# Patient Record
Sex: Female | Born: 1950 | Race: White | Hispanic: No | Marital: Married | Smoking: Former smoker
Health system: Southern US, Community
[De-identification: ages and names within clinical notes are randomized; demographics above are authoritative.]

## PROBLEM LIST (undated history)

## (undated) DIAGNOSIS — C801 Malignant (primary) neoplasm, unspecified: Secondary | ICD-10-CM

## (undated) DIAGNOSIS — E785 Hyperlipidemia, unspecified: Secondary | ICD-10-CM

## (undated) DIAGNOSIS — Z8041 Family history of malignant neoplasm of ovary: Secondary | ICD-10-CM

## (undated) DIAGNOSIS — J449 Chronic obstructive pulmonary disease, unspecified: Secondary | ICD-10-CM

## (undated) DIAGNOSIS — Z8701 Personal history of pneumonia (recurrent): Secondary | ICD-10-CM

## (undated) DIAGNOSIS — T8859XA Other complications of anesthesia, initial encounter: Secondary | ICD-10-CM

## (undated) DIAGNOSIS — F419 Anxiety disorder, unspecified: Secondary | ICD-10-CM

## (undated) DIAGNOSIS — M1990 Unspecified osteoarthritis, unspecified site: Secondary | ICD-10-CM

## (undated) DIAGNOSIS — T4145XA Adverse effect of unspecified anesthetic, initial encounter: Secondary | ICD-10-CM

## (undated) DIAGNOSIS — R918 Other nonspecific abnormal finding of lung field: Secondary | ICD-10-CM

## (undated) DIAGNOSIS — M5126 Other intervertebral disc displacement, lumbar region: Secondary | ICD-10-CM

## (undated) DIAGNOSIS — K2100 Gastro-esophageal reflux disease with esophagitis, without bleeding: Secondary | ICD-10-CM

## (undated) DIAGNOSIS — Z9889 Other specified postprocedural states: Secondary | ICD-10-CM

## (undated) DIAGNOSIS — R0602 Shortness of breath: Secondary | ICD-10-CM

## (undated) DIAGNOSIS — F32A Depression, unspecified: Secondary | ICD-10-CM

## (undated) DIAGNOSIS — F329 Major depressive disorder, single episode, unspecified: Secondary | ICD-10-CM

## (undated) DIAGNOSIS — M419 Scoliosis, unspecified: Secondary | ICD-10-CM

## (undated) DIAGNOSIS — J439 Emphysema, unspecified: Secondary | ICD-10-CM

## (undated) DIAGNOSIS — Z8711 Personal history of peptic ulcer disease: Secondary | ICD-10-CM

## (undated) DIAGNOSIS — Z973 Presence of spectacles and contact lenses: Secondary | ICD-10-CM

## (undated) DIAGNOSIS — N200 Calculus of kidney: Secondary | ICD-10-CM

## (undated) DIAGNOSIS — K5900 Constipation, unspecified: Secondary | ICD-10-CM

## (undated) DIAGNOSIS — Z87442 Personal history of urinary calculi: Secondary | ICD-10-CM

## (undated) DIAGNOSIS — M5136 Other intervertebral disc degeneration, lumbar region: Secondary | ICD-10-CM

## (undated) DIAGNOSIS — C3490 Malignant neoplasm of unspecified part of unspecified bronchus or lung: Secondary | ICD-10-CM

## (undated) DIAGNOSIS — J310 Chronic rhinitis: Secondary | ICD-10-CM

## (undated) DIAGNOSIS — Z8709 Personal history of other diseases of the respiratory system: Secondary | ICD-10-CM

## (undated) DIAGNOSIS — K219 Gastro-esophageal reflux disease without esophagitis: Secondary | ICD-10-CM

## (undated) DIAGNOSIS — I2510 Atherosclerotic heart disease of native coronary artery without angina pectoris: Secondary | ICD-10-CM

## (undated) DIAGNOSIS — R112 Nausea with vomiting, unspecified: Secondary | ICD-10-CM

## (undated) DIAGNOSIS — N814 Uterovaginal prolapse, unspecified: Secondary | ICD-10-CM

## (undated) DIAGNOSIS — Z8719 Personal history of other diseases of the digestive system: Secondary | ICD-10-CM

## (undated) HISTORY — DX: Gastro-esophageal reflux disease with esophagitis: K21.0

## (undated) HISTORY — DX: Emphysema, unspecified: J43.9

## (undated) HISTORY — DX: Other intervertebral disc degeneration, lumbar region: M51.36

## (undated) HISTORY — PX: COLONOSCOPY: SHX174

## (undated) HISTORY — PX: ESOPHAGOGASTRODUODENOSCOPY: SHX1529

## (undated) HISTORY — DX: Calculus of kidney: N20.0

## (undated) HISTORY — DX: Unspecified osteoarthritis, unspecified site: M19.90

## (undated) HISTORY — DX: Chronic obstructive pulmonary disease, unspecified: J44.9

## (undated) HISTORY — PX: KNEE SURGERY: SHX244

## (undated) HISTORY — PX: SINUS SURGERY WITH INSTATRAK: SHX5215

## (undated) HISTORY — DX: Chronic rhinitis: J31.0

## (undated) HISTORY — PX: TUBAL LIGATION: SHX77

## (undated) HISTORY — DX: Malignant neoplasm of unspecified part of unspecified bronchus or lung: C34.90

## (undated) HISTORY — DX: Hyperlipidemia, unspecified: E78.5

## (undated) HISTORY — PX: EYE SURGERY: SHX253

## (undated) HISTORY — DX: Scoliosis, unspecified: M41.9

## (undated) HISTORY — PX: DILATION AND CURETTAGE OF UTERUS: SHX78

## (undated) HISTORY — DX: Gastro-esophageal reflux disease with esophagitis, without bleeding: K21.00

## (undated) HISTORY — PX: VOCAL CORD LATERALIZATION, ENDOSCOPIC APPROACH W/ MLB: SHX2664

## (undated) HISTORY — PX: BREAST EXCISIONAL BIOPSY: SUR124

## (undated) HISTORY — DX: Other intervertebral disc displacement, lumbar region: M51.26

---

## 1990-02-03 HISTORY — PX: BREAST SURGERY: SHX581

## 1998-01-25 ENCOUNTER — Ambulatory Visit (HOSPITAL_COMMUNITY): Admission: RE | Admit: 1998-01-25 | Discharge: 1998-01-25 | Payer: Self-pay | Admitting: Obstetrics and Gynecology

## 1998-01-25 ENCOUNTER — Encounter: Payer: Self-pay | Admitting: Obstetrics and Gynecology

## 1998-07-06 ENCOUNTER — Other Ambulatory Visit: Admission: RE | Admit: 1998-07-06 | Discharge: 1998-07-06 | Payer: Self-pay | Admitting: Obstetrics and Gynecology

## 1999-02-28 ENCOUNTER — Encounter: Payer: Self-pay | Admitting: Internal Medicine

## 1999-02-28 ENCOUNTER — Encounter: Admission: RE | Admit: 1999-02-28 | Discharge: 1999-02-28 | Payer: Self-pay | Admitting: Internal Medicine

## 1999-07-02 ENCOUNTER — Encounter: Payer: Self-pay | Admitting: Obstetrics and Gynecology

## 1999-07-02 ENCOUNTER — Encounter: Admission: RE | Admit: 1999-07-02 | Discharge: 1999-07-02 | Payer: Self-pay | Admitting: Obstetrics and Gynecology

## 1999-07-09 ENCOUNTER — Encounter: Payer: Self-pay | Admitting: Urology

## 1999-07-09 ENCOUNTER — Encounter: Admission: RE | Admit: 1999-07-09 | Discharge: 1999-07-09 | Payer: Self-pay

## 1999-07-26 ENCOUNTER — Other Ambulatory Visit: Admission: RE | Admit: 1999-07-26 | Discharge: 1999-07-26 | Payer: Self-pay | Admitting: Obstetrics and Gynecology

## 2000-01-03 ENCOUNTER — Ambulatory Visit (HOSPITAL_COMMUNITY): Admission: RE | Admit: 2000-01-03 | Discharge: 2000-01-03 | Payer: Self-pay | Admitting: Obstetrics and Gynecology

## 2000-01-03 ENCOUNTER — Encounter (INDEPENDENT_AMBULATORY_CARE_PROVIDER_SITE_OTHER): Payer: Self-pay | Admitting: Specialist

## 2000-07-02 ENCOUNTER — Encounter: Payer: Self-pay | Admitting: Obstetrics and Gynecology

## 2000-07-02 ENCOUNTER — Encounter: Admission: RE | Admit: 2000-07-02 | Discharge: 2000-07-02 | Payer: Self-pay | Admitting: Obstetrics and Gynecology

## 2000-07-27 ENCOUNTER — Other Ambulatory Visit: Admission: RE | Admit: 2000-07-27 | Discharge: 2000-07-27 | Payer: Self-pay | Admitting: Otolaryngology

## 2000-07-27 ENCOUNTER — Encounter (INDEPENDENT_AMBULATORY_CARE_PROVIDER_SITE_OTHER): Payer: Self-pay

## 2000-07-30 ENCOUNTER — Other Ambulatory Visit: Admission: RE | Admit: 2000-07-30 | Discharge: 2000-07-30 | Payer: Self-pay | Admitting: Obstetrics and Gynecology

## 2000-10-09 ENCOUNTER — Encounter: Payer: Self-pay | Admitting: Otolaryngology

## 2000-10-09 ENCOUNTER — Encounter: Admission: RE | Admit: 2000-10-09 | Discharge: 2000-10-09 | Payer: Self-pay | Admitting: Otolaryngology

## 2000-10-21 ENCOUNTER — Other Ambulatory Visit: Admission: RE | Admit: 2000-10-21 | Discharge: 2000-10-21 | Payer: Self-pay | Admitting: Otolaryngology

## 2000-10-21 ENCOUNTER — Encounter (INDEPENDENT_AMBULATORY_CARE_PROVIDER_SITE_OTHER): Payer: Self-pay | Admitting: Specialist

## 2001-06-17 ENCOUNTER — Encounter: Payer: Self-pay | Admitting: Family Medicine

## 2001-06-17 ENCOUNTER — Encounter: Admission: RE | Admit: 2001-06-17 | Discharge: 2001-06-17 | Payer: Self-pay | Admitting: Family Medicine

## 2001-07-02 ENCOUNTER — Encounter: Admission: RE | Admit: 2001-07-02 | Discharge: 2001-07-02 | Payer: Self-pay | Admitting: Obstetrics and Gynecology

## 2001-07-02 ENCOUNTER — Encounter: Payer: Self-pay | Admitting: Obstetrics and Gynecology

## 2001-07-05 ENCOUNTER — Other Ambulatory Visit: Admission: RE | Admit: 2001-07-05 | Discharge: 2001-07-05 | Payer: Self-pay | Admitting: Obstetrics and Gynecology

## 2001-09-13 ENCOUNTER — Encounter: Admission: RE | Admit: 2001-09-13 | Discharge: 2001-09-13 | Payer: Self-pay | Admitting: Orthopedic Surgery

## 2001-09-13 ENCOUNTER — Encounter: Payer: Self-pay | Admitting: Orthopedic Surgery

## 2002-02-25 ENCOUNTER — Encounter: Admission: RE | Admit: 2002-02-25 | Discharge: 2002-02-25 | Payer: Self-pay | Admitting: Otolaryngology

## 2002-02-25 ENCOUNTER — Encounter: Payer: Self-pay | Admitting: Otolaryngology

## 2002-09-23 ENCOUNTER — Encounter: Admission: RE | Admit: 2002-09-23 | Discharge: 2002-09-23 | Payer: Self-pay | Admitting: Obstetrics and Gynecology

## 2002-09-23 ENCOUNTER — Encounter: Payer: Self-pay | Admitting: Obstetrics and Gynecology

## 2002-11-04 ENCOUNTER — Other Ambulatory Visit: Admission: RE | Admit: 2002-11-04 | Discharge: 2002-11-04 | Payer: Self-pay | Admitting: Obstetrics and Gynecology

## 2003-01-09 ENCOUNTER — Encounter: Admission: RE | Admit: 2003-01-09 | Discharge: 2003-01-09 | Payer: Self-pay | Admitting: Family Medicine

## 2003-01-20 ENCOUNTER — Ambulatory Visit (HOSPITAL_COMMUNITY): Admission: RE | Admit: 2003-01-20 | Discharge: 2003-01-20 | Payer: Self-pay | Admitting: Family Medicine

## 2003-01-26 ENCOUNTER — Ambulatory Visit (HOSPITAL_COMMUNITY): Admission: RE | Admit: 2003-01-26 | Discharge: 2003-01-26 | Payer: Self-pay | Admitting: Family Medicine

## 2003-03-24 ENCOUNTER — Emergency Department (HOSPITAL_COMMUNITY): Admission: EM | Admit: 2003-03-24 | Discharge: 2003-03-24 | Payer: Self-pay | Admitting: Emergency Medicine

## 2003-04-17 ENCOUNTER — Encounter: Admission: RE | Admit: 2003-04-17 | Discharge: 2003-04-17 | Payer: Self-pay | Admitting: Family Medicine

## 2003-05-05 DIAGNOSIS — C801 Malignant (primary) neoplasm, unspecified: Secondary | ICD-10-CM

## 2003-05-05 HISTORY — PX: LUNG LOBECTOMY: SHX167

## 2003-05-05 HISTORY — DX: Malignant (primary) neoplasm, unspecified: C80.1

## 2003-05-11 ENCOUNTER — Ambulatory Visit (HOSPITAL_COMMUNITY): Admission: RE | Admit: 2003-05-11 | Discharge: 2003-05-11 | Payer: Self-pay | Admitting: Thoracic Surgery

## 2003-05-22 ENCOUNTER — Inpatient Hospital Stay (HOSPITAL_COMMUNITY): Admission: RE | Admit: 2003-05-22 | Discharge: 2003-05-26 | Payer: Self-pay | Admitting: Thoracic Surgery

## 2003-05-22 ENCOUNTER — Encounter (INDEPENDENT_AMBULATORY_CARE_PROVIDER_SITE_OTHER): Payer: Self-pay | Admitting: Specialist

## 2003-05-31 ENCOUNTER — Encounter: Admission: RE | Admit: 2003-05-31 | Discharge: 2003-05-31 | Payer: Self-pay | Admitting: Thoracic Surgery

## 2003-06-21 ENCOUNTER — Encounter: Admission: RE | Admit: 2003-06-21 | Discharge: 2003-06-21 | Payer: Self-pay | Admitting: Thoracic Surgery

## 2003-07-19 ENCOUNTER — Encounter: Admission: RE | Admit: 2003-07-19 | Discharge: 2003-07-19 | Payer: Self-pay | Admitting: Thoracic Surgery

## 2003-08-06 ENCOUNTER — Inpatient Hospital Stay (HOSPITAL_COMMUNITY): Admission: EM | Admit: 2003-08-06 | Discharge: 2003-08-08 | Payer: Self-pay | Admitting: Emergency Medicine

## 2003-10-19 ENCOUNTER — Ambulatory Visit (HOSPITAL_COMMUNITY): Admission: RE | Admit: 2003-10-19 | Discharge: 2003-10-19 | Payer: Self-pay | Admitting: Oncology

## 2003-10-30 ENCOUNTER — Encounter: Admission: RE | Admit: 2003-10-30 | Discharge: 2003-10-30 | Payer: Self-pay | Admitting: Obstetrics and Gynecology

## 2003-11-01 ENCOUNTER — Encounter: Admission: RE | Admit: 2003-11-01 | Discharge: 2003-11-01 | Payer: Self-pay | Admitting: Obstetrics and Gynecology

## 2003-11-08 ENCOUNTER — Other Ambulatory Visit: Admission: RE | Admit: 2003-11-08 | Discharge: 2003-11-08 | Payer: Self-pay | Admitting: Obstetrics and Gynecology

## 2004-02-08 ENCOUNTER — Ambulatory Visit: Payer: Self-pay | Admitting: Oncology

## 2004-02-08 ENCOUNTER — Ambulatory Visit (HOSPITAL_COMMUNITY): Admission: RE | Admit: 2004-02-08 | Discharge: 2004-02-08 | Payer: Self-pay | Admitting: Oncology

## 2004-05-08 ENCOUNTER — Ambulatory Visit (HOSPITAL_COMMUNITY): Admission: RE | Admit: 2004-05-08 | Discharge: 2004-05-08 | Payer: Self-pay | Admitting: Oncology

## 2004-05-13 ENCOUNTER — Ambulatory Visit: Payer: Self-pay | Admitting: Oncology

## 2004-07-27 ENCOUNTER — Encounter: Admission: RE | Admit: 2004-07-27 | Discharge: 2004-07-27 | Payer: Self-pay | Admitting: Orthopedic Surgery

## 2004-08-12 ENCOUNTER — Ambulatory Visit: Payer: Self-pay | Admitting: Oncology

## 2004-08-20 ENCOUNTER — Encounter: Admission: RE | Admit: 2004-08-20 | Discharge: 2004-08-20 | Payer: Self-pay | Admitting: Orthopedic Surgery

## 2004-09-03 ENCOUNTER — Encounter: Admission: RE | Admit: 2004-09-03 | Discharge: 2004-09-03 | Payer: Self-pay | Admitting: Orthopedic Surgery

## 2004-09-24 ENCOUNTER — Encounter: Admission: RE | Admit: 2004-09-24 | Discharge: 2004-09-24 | Payer: Self-pay | Admitting: Orthopedic Surgery

## 2004-11-19 ENCOUNTER — Encounter: Admission: RE | Admit: 2004-11-19 | Discharge: 2004-11-19 | Payer: Self-pay | Admitting: Obstetrics and Gynecology

## 2004-11-25 ENCOUNTER — Other Ambulatory Visit: Admission: RE | Admit: 2004-11-25 | Discharge: 2004-11-25 | Payer: Self-pay | Admitting: Obstetrics and Gynecology

## 2004-12-20 ENCOUNTER — Ambulatory Visit: Payer: Self-pay | Admitting: Oncology

## 2005-03-17 ENCOUNTER — Encounter: Admission: RE | Admit: 2005-03-17 | Discharge: 2005-03-17 | Payer: Self-pay | Admitting: Obstetrics and Gynecology

## 2005-05-08 ENCOUNTER — Ambulatory Visit: Payer: Self-pay | Admitting: Oncology

## 2005-05-08 LAB — COMPREHENSIVE METABOLIC PANEL
ALT: 16 U/L (ref 0–40)
Alkaline Phosphatase: 75 U/L (ref 39–117)
CO2: 23 mEq/L (ref 19–32)
Creatinine, Ser: 0.6 mg/dL (ref 0.4–1.2)
Sodium: 140 mEq/L (ref 135–145)
Total Bilirubin: 0.8 mg/dL (ref 0.3–1.2)
Total Protein: 7.1 g/dL (ref 6.0–8.3)

## 2005-05-08 LAB — CBC WITH DIFFERENTIAL/PLATELET
BASO%: 0.5 % (ref 0.0–2.0)
HCT: 44.1 % (ref 34.8–46.6)
LYMPH%: 33.8 % (ref 14.0–48.0)
MCH: 30.6 pg (ref 26.0–34.0)
MCHC: 34.4 g/dL (ref 32.0–36.0)
MCV: 88.9 fL (ref 81.0–101.0)
MONO#: 0.4 10*3/uL (ref 0.1–0.9)
MONO%: 6.3 % (ref 0.0–13.0)
NEUT%: 58 % (ref 39.6–76.8)
Platelets: 282 10*3/uL (ref 145–400)
RBC: 4.96 10*6/uL (ref 3.70–5.32)

## 2005-05-08 LAB — LACTATE DEHYDROGENASE: LDH: 151 U/L (ref 94–250)

## 2005-05-12 ENCOUNTER — Ambulatory Visit (HOSPITAL_COMMUNITY): Admission: RE | Admit: 2005-05-12 | Discharge: 2005-05-12 | Payer: Self-pay | Admitting: Oncology

## 2005-09-11 ENCOUNTER — Ambulatory Visit (HOSPITAL_COMMUNITY): Admission: RE | Admit: 2005-09-11 | Discharge: 2005-09-11 | Payer: Self-pay | Admitting: Oncology

## 2005-09-17 ENCOUNTER — Ambulatory Visit: Payer: Self-pay | Admitting: Oncology

## 2005-09-19 LAB — COMPREHENSIVE METABOLIC PANEL
ALT: 14 U/L (ref 0–40)
AST: 15 U/L (ref 0–37)
Albumin: 4.5 g/dL (ref 3.5–5.2)
Alkaline Phosphatase: 73 U/L (ref 39–117)
Glucose, Bld: 96 mg/dL (ref 70–99)
Potassium: 3.9 mEq/L (ref 3.5–5.3)
Sodium: 143 mEq/L (ref 135–145)
Total Bilirubin: 0.6 mg/dL (ref 0.3–1.2)
Total Protein: 6.7 g/dL (ref 6.0–8.3)

## 2005-09-19 LAB — CBC WITH DIFFERENTIAL/PLATELET
Basophils Absolute: 0 10*3/uL (ref 0.0–0.1)
EOS%: 1.7 % (ref 0.0–7.0)
HGB: 14.7 g/dL (ref 11.6–15.9)
LYMPH%: 27.6 % (ref 14.0–48.0)
MCH: 30.9 pg (ref 26.0–34.0)
MCV: 89.4 fL (ref 81.0–101.0)
MONO%: 7 % (ref 0.0–13.0)
Platelets: 261 10*3/uL (ref 145–400)
RDW: 12.8 % (ref 11.3–14.5)

## 2005-11-25 ENCOUNTER — Encounter: Admission: RE | Admit: 2005-11-25 | Discharge: 2005-11-25 | Payer: Self-pay | Admitting: Obstetrics and Gynecology

## 2005-12-02 ENCOUNTER — Other Ambulatory Visit: Admission: RE | Admit: 2005-12-02 | Discharge: 2005-12-02 | Payer: Self-pay | Admitting: Obstetrics & Gynecology

## 2006-01-07 ENCOUNTER — Ambulatory Visit (HOSPITAL_COMMUNITY): Admission: RE | Admit: 2006-01-07 | Discharge: 2006-01-07 | Payer: Self-pay | Admitting: Oncology

## 2006-01-09 ENCOUNTER — Ambulatory Visit: Payer: Self-pay | Admitting: Oncology

## 2006-01-19 LAB — COMPREHENSIVE METABOLIC PANEL
ALT: 12 U/L (ref 0–35)
AST: 16 U/L (ref 0–37)
Albumin: 4.7 g/dL (ref 3.5–5.2)
Alkaline Phosphatase: 79 U/L (ref 39–117)
Calcium: 9.4 mg/dL (ref 8.4–10.5)
Chloride: 104 mEq/L (ref 96–112)
Creatinine, Ser: 0.68 mg/dL (ref 0.40–1.20)
Potassium: 4 mEq/L (ref 3.5–5.3)

## 2006-01-19 LAB — CBC WITH DIFFERENTIAL/PLATELET
BASO%: 0.8 % (ref 0.0–2.0)
EOS%: 1.7 % (ref 0.0–7.0)
MCH: 31.5 pg (ref 26.0–34.0)
MCHC: 34.8 g/dL (ref 32.0–36.0)
RDW: 12.6 % (ref 11.3–14.5)
lymph#: 1.9 10*3/uL (ref 0.9–3.3)

## 2006-05-07 ENCOUNTER — Ambulatory Visit: Payer: Self-pay | Admitting: Oncology

## 2006-05-12 LAB — LACTATE DEHYDROGENASE: LDH: 138 U/L (ref 94–250)

## 2006-05-12 LAB — CBC WITH DIFFERENTIAL/PLATELET
Basophils Absolute: 0.1 10*3/uL (ref 0.0–0.1)
Eosinophils Absolute: 0.1 10*3/uL (ref 0.0–0.5)
HCT: 43.8 % (ref 34.8–46.6)
LYMPH%: 31.3 % (ref 14.0–48.0)
MCV: 89.9 fL (ref 81.0–101.0)
MONO#: 0.4 10*3/uL (ref 0.1–0.9)
MONO%: 7 % (ref 0.0–13.0)
NEUT#: 3.4 10*3/uL (ref 1.5–6.5)
NEUT%: 58.9 % (ref 39.6–76.8)
Platelets: 259 10*3/uL (ref 145–400)
WBC: 5.8 10*3/uL (ref 3.9–10.0)

## 2006-05-12 LAB — COMPREHENSIVE METABOLIC PANEL
BUN: 12 mg/dL (ref 6–23)
CO2: 24 mEq/L (ref 19–32)
Creatinine, Ser: 0.57 mg/dL (ref 0.40–1.20)
Glucose, Bld: 123 mg/dL — ABNORMAL HIGH (ref 70–99)
Sodium: 141 mEq/L (ref 135–145)
Total Bilirubin: 0.5 mg/dL (ref 0.3–1.2)
Total Protein: 6.7 g/dL (ref 6.0–8.3)

## 2006-05-18 ENCOUNTER — Ambulatory Visit (HOSPITAL_COMMUNITY): Admission: RE | Admit: 2006-05-18 | Discharge: 2006-05-18 | Payer: Self-pay | Admitting: Oncology

## 2006-11-06 ENCOUNTER — Ambulatory Visit: Payer: Self-pay | Admitting: Oncology

## 2006-11-10 ENCOUNTER — Ambulatory Visit (HOSPITAL_COMMUNITY): Admission: RE | Admit: 2006-11-10 | Discharge: 2006-11-10 | Payer: Self-pay | Admitting: Oncology

## 2006-11-10 LAB — COMPREHENSIVE METABOLIC PANEL
ALT: 17 U/L (ref 0–35)
CO2: 27 mEq/L (ref 19–32)
Calcium: 9.2 mg/dL (ref 8.4–10.5)
Chloride: 103 mEq/L (ref 96–112)
Creatinine, Ser: 0.63 mg/dL (ref 0.40–1.20)
Sodium: 141 mEq/L (ref 135–145)
Total Protein: 6.8 g/dL (ref 6.0–8.3)

## 2006-11-10 LAB — CBC WITH DIFFERENTIAL/PLATELET
BASO%: 0.7 % (ref 0.0–2.0)
Eosinophils Absolute: 0.1 10*3/uL (ref 0.0–0.5)
HCT: 43.4 % (ref 34.8–46.6)
MCHC: 35.3 g/dL (ref 32.0–36.0)
MONO#: 0.4 10*3/uL (ref 0.1–0.9)
NEUT#: 3.6 10*3/uL (ref 1.5–6.5)
NEUT%: 62.3 % (ref 39.6–76.8)
WBC: 5.7 10*3/uL (ref 3.9–10.0)
lymph#: 1.5 10*3/uL (ref 0.9–3.3)

## 2006-11-10 LAB — LACTATE DEHYDROGENASE: LDH: 158 U/L (ref 94–250)

## 2006-11-12 ENCOUNTER — Encounter: Admission: RE | Admit: 2006-11-12 | Discharge: 2006-11-12 | Payer: Self-pay | Admitting: Family Medicine

## 2006-12-15 ENCOUNTER — Encounter: Admission: RE | Admit: 2006-12-15 | Discharge: 2006-12-15 | Payer: Self-pay | Admitting: Obstetrics & Gynecology

## 2006-12-15 ENCOUNTER — Other Ambulatory Visit: Admission: RE | Admit: 2006-12-15 | Discharge: 2006-12-15 | Payer: Self-pay | Admitting: Obstetrics and Gynecology

## 2007-05-04 ENCOUNTER — Ambulatory Visit: Payer: Self-pay | Admitting: Oncology

## 2007-05-06 LAB — COMPREHENSIVE METABOLIC PANEL
ALT: 15 U/L (ref 0–35)
Albumin: 4.7 g/dL (ref 3.5–5.2)
CO2: 26 mEq/L (ref 19–32)
Calcium: 9.5 mg/dL (ref 8.4–10.5)
Chloride: 103 mEq/L (ref 96–112)
Potassium: 4.1 mEq/L (ref 3.5–5.3)
Sodium: 141 mEq/L (ref 135–145)
Total Protein: 7 g/dL (ref 6.0–8.3)

## 2007-05-06 LAB — CBC WITH DIFFERENTIAL/PLATELET
Basophils Absolute: 0 10*3/uL (ref 0.0–0.1)
Eosinophils Absolute: 0.1 10*3/uL (ref 0.0–0.5)
HCT: 42 % (ref 34.8–46.6)
HGB: 14.9 g/dL (ref 11.6–15.9)
MCH: 31.5 pg (ref 26.0–34.0)
MCV: 88.9 fL (ref 81.0–101.0)
MONO%: 8.2 % (ref 0.0–13.0)
NEUT#: 2.7 10*3/uL (ref 1.5–6.5)
NEUT%: 55.8 % (ref 39.6–76.8)
RDW: 13.1 % (ref 11.3–14.5)
lymph#: 1.6 10*3/uL (ref 0.9–3.3)

## 2007-05-06 LAB — LACTATE DEHYDROGENASE: LDH: 154 U/L (ref 94–250)

## 2007-05-07 ENCOUNTER — Ambulatory Visit (HOSPITAL_COMMUNITY): Admission: RE | Admit: 2007-05-07 | Discharge: 2007-05-07 | Payer: Self-pay | Admitting: Oncology

## 2007-11-11 ENCOUNTER — Ambulatory Visit: Payer: Self-pay | Admitting: Oncology

## 2007-11-15 LAB — CBC WITH DIFFERENTIAL/PLATELET
BASO%: 0.5 % (ref 0.0–2.0)
Basophils Absolute: 0 10*3/uL (ref 0.0–0.1)
EOS%: 1.7 % (ref 0.0–7.0)
HCT: 44.5 % (ref 34.8–46.6)
HGB: 15.7 g/dL (ref 11.6–15.9)
LYMPH%: 31.2 % (ref 14.0–48.0)
MCH: 32 pg (ref 26.0–34.0)
MCHC: 35.3 g/dL (ref 32.0–36.0)
MCV: 90.7 fL (ref 81.0–101.0)
MONO%: 8 % (ref 0.0–13.0)
NEUT%: 58.6 % (ref 39.6–76.8)
Platelets: 249 10*3/uL (ref 145–400)

## 2007-11-15 LAB — COMPREHENSIVE METABOLIC PANEL
ALT: 13 U/L (ref 0–35)
AST: 16 U/L (ref 0–37)
BUN: 11 mg/dL (ref 6–23)
Calcium: 9.7 mg/dL (ref 8.4–10.5)
Chloride: 103 mEq/L (ref 96–112)
Creatinine, Ser: 0.65 mg/dL (ref 0.40–1.20)
Total Bilirubin: 0.8 mg/dL (ref 0.3–1.2)

## 2007-12-16 ENCOUNTER — Encounter: Admission: RE | Admit: 2007-12-16 | Discharge: 2007-12-16 | Payer: Self-pay | Admitting: Obstetrics & Gynecology

## 2007-12-23 ENCOUNTER — Other Ambulatory Visit: Admission: RE | Admit: 2007-12-23 | Discharge: 2007-12-23 | Payer: Self-pay | Admitting: Obstetrics and Gynecology

## 2008-01-05 ENCOUNTER — Encounter: Admission: RE | Admit: 2008-01-05 | Discharge: 2008-01-05 | Payer: Self-pay | Admitting: Obstetrics and Gynecology

## 2008-11-10 ENCOUNTER — Ambulatory Visit: Payer: Self-pay | Admitting: Oncology

## 2008-11-14 LAB — CBC WITH DIFFERENTIAL/PLATELET
BASO%: 0.5 % (ref 0.0–2.0)
Basophils Absolute: 0 10*3/uL (ref 0.0–0.1)
EOS%: 2.6 % (ref 0.0–7.0)
HGB: 14.7 g/dL (ref 11.6–15.9)
MCH: 31.1 pg (ref 25.1–34.0)
MCHC: 34.6 g/dL (ref 31.5–36.0)
MONO#: 0.4 10*3/uL (ref 0.1–0.9)
NEUT%: 55.8 % (ref 38.4–76.8)
RDW: 12.7 % (ref 11.2–14.5)
WBC: 5 10*3/uL (ref 3.9–10.3)
lymph#: 1.7 10*3/uL (ref 0.9–3.3)

## 2008-11-14 LAB — COMPREHENSIVE METABOLIC PANEL
Albumin: 4.4 g/dL (ref 3.5–5.2)
BUN: 12 mg/dL (ref 6–23)
Glucose, Bld: 81 mg/dL (ref 70–99)
Sodium: 142 mEq/L (ref 135–145)
Total Protein: 6.4 g/dL (ref 6.0–8.3)

## 2008-11-14 LAB — LACTATE DEHYDROGENASE: LDH: 142 U/L (ref 94–250)

## 2008-12-05 ENCOUNTER — Ambulatory Visit (HOSPITAL_COMMUNITY): Admission: RE | Admit: 2008-12-05 | Discharge: 2008-12-05 | Payer: Self-pay | Admitting: Oncology

## 2009-01-03 HISTORY — PX: CYSTOSCOPY: SUR368

## 2009-01-09 ENCOUNTER — Encounter: Admission: RE | Admit: 2009-01-09 | Discharge: 2009-01-09 | Payer: Self-pay | Admitting: Obstetrics & Gynecology

## 2009-01-16 ENCOUNTER — Encounter: Admission: RE | Admit: 2009-01-16 | Discharge: 2009-01-16 | Payer: Self-pay | Admitting: Obstetrics & Gynecology

## 2009-02-03 HISTORY — PX: RETINAL DETACHMENT SURGERY: SHX105

## 2009-03-15 ENCOUNTER — Encounter: Admission: RE | Admit: 2009-03-15 | Discharge: 2009-03-15 | Payer: Self-pay | Admitting: Family Medicine

## 2009-11-13 ENCOUNTER — Ambulatory Visit: Payer: Self-pay | Admitting: Oncology

## 2009-11-15 ENCOUNTER — Ambulatory Visit (HOSPITAL_COMMUNITY): Admission: RE | Admit: 2009-11-15 | Discharge: 2009-11-15 | Payer: Self-pay | Admitting: Oncology

## 2009-11-15 LAB — COMPREHENSIVE METABOLIC PANEL
ALT: 16 U/L (ref 0–35)
Albumin: 4.3 g/dL (ref 3.5–5.2)
Calcium: 9 mg/dL (ref 8.4–10.5)
Chloride: 107 mEq/L (ref 96–112)
Potassium: 4.6 mEq/L (ref 3.5–5.3)

## 2009-11-15 LAB — CBC WITH DIFFERENTIAL/PLATELET
BASO%: 0.8 % (ref 0.0–2.0)
EOS%: 3.1 % (ref 0.0–7.0)
HCT: 41.9 % (ref 34.8–46.6)
LYMPH%: 44.2 % (ref 14.0–49.7)
MCH: 31 pg (ref 25.1–34.0)
MCV: 89.5 fL (ref 79.5–101.0)
MONO%: 8.6 % (ref 0.0–14.0)
NEUT#: 1.5 10*3/uL (ref 1.5–6.5)
NEUT%: 43.3 % (ref 38.4–76.8)
Platelets: 233 10*3/uL (ref 145–400)
RDW: 13 % (ref 11.2–14.5)
WBC: 3.5 10*3/uL — ABNORMAL LOW (ref 3.9–10.3)

## 2009-11-15 LAB — LACTATE DEHYDROGENASE: LDH: 154 U/L (ref 94–250)

## 2010-01-25 ENCOUNTER — Encounter
Admission: RE | Admit: 2010-01-25 | Discharge: 2010-01-25 | Payer: Self-pay | Source: Home / Self Care | Attending: Obstetrics & Gynecology | Admitting: Obstetrics & Gynecology

## 2010-02-24 ENCOUNTER — Encounter: Payer: Self-pay | Admitting: Obstetrics & Gynecology

## 2010-02-24 ENCOUNTER — Encounter: Payer: Self-pay | Admitting: Obstetrics and Gynecology

## 2010-02-26 ENCOUNTER — Other Ambulatory Visit: Payer: Self-pay | Admitting: Obstetrics & Gynecology

## 2010-02-26 DIAGNOSIS — Z780 Asymptomatic menopausal state: Secondary | ICD-10-CM

## 2010-03-11 ENCOUNTER — Ambulatory Visit
Admission: RE | Admit: 2010-03-11 | Discharge: 2010-03-11 | Disposition: A | Discharge status: Home / Self Care | Payer: Self-pay | Source: Ambulatory Visit | Attending: Obstetrics & Gynecology | Admitting: Obstetrics & Gynecology

## 2010-03-11 DIAGNOSIS — Z780 Asymptomatic menopausal state: Secondary | ICD-10-CM

## 2010-06-21 NOTE — Consult Note (Signed)
NAMEMarland Kitchen  Tammy Eaton, Tammy Eaton                          ACCOUNT NO.:  [id number]   MEDICAL RECORD NO.:  [id number]                   PATIENT TYPE:  INP   LOCATION:  2038                                 FACILITY:  [facility]   PHYSICIAN:  Samul Dada, M.D.            DATE OF BIRTH:  September 25, 1950   DATE OF CONSULTATION:  05/25/2003  DATE OF DISCHARGE:  05/26/2003                                   CONSULTATION   REFERRING PHYSICIAN:  Ines Bloomer, M.D.   HISTORY OF PRESENT ILLNESS:  Tammy Eaton is a pleasant 60 year old female  asked to see in consultation for evaluation of lung cancer.  In 2000, a left  upper lobe lesion was found incidentally per chest x-ray, followed until now  by chest x-ray and CTs.  A CT scan on April 17, 2003, revealed an increasing  size of the lesion, from 11 x 16 mm in December 2004, to 16 mm x 8 cm now,  with appearance strongly suspicious for cancer.  A follow-up PET scan on  May 11, 2003, revealed this mass to be consistent with malignancy.  No  adenopathy was noted.  A left upper lobectomy on May 22, 2003, was  performed by Dr. Edwyna Shell, pathology report [id number], Dr. Delila Spence.  This was  positive for adenocarcinoma of the left upper lung, G2, negative margins,  negative pleural involvement, no direct extension of the tumor, no vascular  invasion, T1, N0, MX equals stage IA, for a maximum tumor size of 1.5 cm.  Will continue to follow.   PAST MEDICAL HISTORY:  1. Lung cancer as above.  2. COPD, history of tobacco abuse.  3. Hyperlipidemia.  4. GERD.  5. Chronic depression.  6. Chronic sinusitis.  7. 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   SURGERIES, PROCEDURES:  1. Status post left upper lobectomy on May 18, 2003, Dr. Edwyna Shell.  2. Status post hysteroscopic resection and D&C January 03, 2000, Dr. Margaretmary Dys,     for fibroids.  3. Status post left lumpectomy, benign, 1994.  4. Status post left sinus  polypectomy in 2001.   ALLERGIES:  SEPTRA.   CURRENT MEDICATIONS:  1. Protonix 40 mg daily.  2. Xanax t.i.d.  3. Percocet.  4. Ultram.  5. Compazine p.r.n.   REVIEW OF SYSTEMS:  The patient's review of systems is essentially negative  with no fever, chills, fatigue, or weight loss.  No decrease in appetite.  No cardiac or respiratory complaints prior to this surgery.  She is somewhat  dyspneic on exertion post surgery, but she is recuperating.  She denies any  abdominal pain, nausea or vomiting, no diarrhea or constipation, no  dysphagia, no dysuria or gross hematuria.  No calf tenderness,  dysthesthesias or peripheral edema.   FAMILY HISTORY:  Mother alive with a history of CVA in 60.  Father died of  alcohol complications.  She has one sister and one brother, in good health.   SOCIAL HISTORY:  The patient is married.  She has two grown children in good  health.  She is a housewife.  She smoked until three years ago, one pack a  day of cigarettes for 40 years.  She is trying to make an effort to quit.   HEALTH MAINTENANCE:  Her last mammogram was in August 2004.  Pap exam August  2004.  Her last flexible sigmoidoscopy was two years ago.  She never had a  transfusion.  Her last flu shot was in October 2004.  Her last bone  densitometry was in August 2003, normal.   PHYSICAL EXAMINATION:  GENERAL:  This is a well-developed, well-nourished 60-  year-old white female in no acute distress, alert and oriented x3.  VITAL SIGNS:  Blood pressure 105/50, pulse 70, respirations 20, temperature  98.1.  Weight 138 pounds, height 5 feet 7 inches.  Pulse oximetry 91% in  room air.  HEENT:  Normocephalic, atraumatic, PERRLA.  Oral mucosa without any visible  lesions or thrush.  Moist.  NECK:  Supple, no JVD, no cervical or supraclavicular masses.  CHEST:  Symmetrical on inspiration, with lungs essentially clear to  auscultation.  The left incision is clean and dry.  BREASTS:  Without  masses.  CARDIAC:  Regular rate and rhythm without murmurs, rubs, or gallops.  ABDOMEN:  Soft, nontender, bowel sounds x4, no palpable spleen or liver.  GENITOURINARY, RECTAL:  Deferred.  EXTREMITIES:  No clubbing, cyanosis, edema, or visible lesions, no purpura  or petechiae.  NEUROLOGIC:  Nonfocal.   LABORATORY DATA:  Hemoglobin 12.1, hematocrit 35.4, white count 8.6,  platelets 257, neutrophils 8, MCV 89.6.  PT 12.1, PTT 32, INR 0.9.  Sodium  136, potassium 3.8, BUN 4, creatinine 0.5, glucose 126, total bilirubin 0.7,  alkaline phosphatase 55, AST 24, ALT 17.  Total protein 5.6, albumin 2.8,  calcium 8.1.   ASSESSMENT AND PLAN:  Stage IA non-small cell lung carcinoma, T1, N0, MX.  Dr. Arline Asp has seen and evaluated the patient, and he will be happy to  follow Tammy Eaton after discharge.  No additional therapy is necessary or  indicated at this time.   Thank you very much for allowing [place] to participate in the care of Ms.  Eaton.     Marlowe Kays, P.A.                        Samul Dada, M.D.    SW/MEDQ  D:  05/29/2003  T:  05/29/2003  Job:  [id number]   cc:   Dr. Alonza Bogus, Psychiatry   Duncan Dull, M.D.  [address]  [place]  [place] [address]  Fax: [phone]   Ines Bloomer, M.D.  [address]  [place]  [place] [address]

## 2010-06-21 NOTE — Discharge Summary (Signed)
NAME:  Tammy Eaton, Tammy Eaton                          ACCOUNT NO.:  [id number]   MEDICAL RECORD NO.:  [id number]                   PATIENT TYPE:  INP   LOCATION:  2038                                 FACILITY:  [facility]   PHYSICIAN:  Ines Bloomer, M.D.              DATE OF BIRTH:  1950/02/23   DATE OF ADMISSION:  05/22/2003  DATE OF DISCHARGE:  05/26/2003                                 DISCHARGE SUMMARY   HISTORY OF PRESENT ILLNESS:  This is a 60 year old white female referred  from Dr. Kevan Ny for evaluation of left upper lobe lung mass. His mass was  originally found on chest x-ray in 2000. He has been followed with serial  chest x-rays and CT scans of the chest with the last CT being done in  September of 2004 revealing scarring of the left upper lobe. She was also  noted to have central lobar emphysema. A CT scan on April 17, 2003 revealed  increased size of the lesion from 11 x 7 to 60 x 80 mm. The new and enlarged  appearance was felt to be strongly suspicious for carcinoma. She was  referred and evaluated by Dr. Edwyna Shell, and studies including PET scan showed  findings consistent with probable malignancy. Her SUV was 4.6. There was no  adenopathy noted. She was felt to be a candidate for video-assisted  thoracoscopy and possible left upper lobectomy and was admitted this  hospitalization for the procedure.   PAST MEDICAL HISTORY:  1. COPD.  2. Dyslipidemia.  3. GERD.  4. Chronic depression.   ALLERGIES:  She has an allergy to [facility].   MEDICATIONS PRIOR TO ADMISSION:  1. Alprazolam 0.5 mg t.i.d. p.r.n.  2. Paroxetine 40 mg daily.  3. Aciphex 20 mg daily.  4. Mucinex 600 mg daily.  5. Zyrtec 10 mg daily.   She also takes multiple vitamins and supplements including coenzyme Q10 two  tablets daily, Estra-C 500 mg two tablets daily, vitamin C 500 mg two  tablets daily, multivitamin one daily, pomegranate 250 mg daily, calcium two  daily.   SOCIAL HISTORY, FAMILY HISTORY, REVIEW  OF SYSTEMS, AND PHYSICAL EXAM:  Please see the history and physical done at the time of admission.   HOSPITAL COURSE:  The patient was admitted electively on May 22, 2003 and  taken to the operating room where she underwent the following procedure:  Left thoracotomy with left upper lobe lobectomy and lymph node sampling.  Frozen section revealed adenocarcinoma. She tolerated the procedure well and  was taken to the post anesthesia care unit in stable condition.   Postoperative hospital course, the patient has done quite well from a post-  surgical view point. She has remained hemodynamically stable with no  significant cardiac dysrhythmias. She has only a mild postoperative anemia.  Her incisions are healing well without signs of infection. She has tolerated  a routine enhancement in activities, and oxygen  has been weaned, and she has  undergone significant pulmonary toilet with good improvement in aeration as  well as in chest x-ray appearance of her lungs. All routine lines, monitors,  and drainage devices were discontinued in the standard fashion. She was seen  in consultation by Dr. Arline Asp. Her pathology reveals a T1, N0, MX stage I-  A, non-small cell lung carcinoma. She is felt to require additional adjuvant  chemotherapy or radiation therapy. She will be followed as an outpatient by  the oncologist. Overall, she was felt to be stable quite stable for  discharge on May 26, 2003.   FINAL DIAGNOSIS:  Stage I-A non-small cell lung carcinoma. Note, the patient  did undergo a brain scan on May 26, 2003 which was negative for evidence  of malignancy.   OTHER DIAGNOSES:  As previously listed including:  1. Chronic obstructive pulmonary disease.  2. Tobacco abuse.  3. Hyperlipidemia.  4. Gastroesophageal reflux disease.  5. Chronic depression.  6. Chronic sinusitis.  7. History of benign laryngeal nodule.   INSTRUCTIONS:  The patient received written instructions in regards  to  medications, activity, diet, wound care, and followup. Followup will include  Dr. Edwyna Shell in one week with a chest x-ray from the [facility].   MEDICATIONS AT DISCHARGE:  As preoperatively. Additionally, for pain, Tylox  one or two every four to six hours as needed.      Tammy Eaton, P.A.-C.                    Ines Bloomer, M.D.    Sherryll Burger  D:  05/26/2003  T:  05/28/2003  Job:  [id number]   cc:   Samul Dada, M.D.  [address] [facility]  [place]  [place] [address]  Fax: [phone]

## 2010-06-21 NOTE — H&P (Signed)
NAME:  Tammy Eaton, Tammy Eaton                          ACCOUNT NO.:  [id number]   MEDICAL RECORD NO.:  [id number]                   PATIENT TYPE:  INP   LOCATION:  0160                                 FACILITY:  [facility]   PHYSICIAN:  Melissa L. Ladona Ridgel, MD               DATE OF BIRTH:  [birth year]   DATE OF ADMISSION:  08/06/2003  DATE OF DISCHARGE:                                HISTORY & PHYSICAL   CHIEF COMPLAINT:  Drug overdose.   PRIMARY CARE PHYSICIAN:  Duncan Dull, M.D.   PSYCHIATRIST:  Jamas Lav, M.D.   HISTORY OF PRESENT ILLNESS:  The patient is a 60 year old white female  recently diagnosed with lung cancer and is status post left thoracotomy in  the left upper lobe and left lymph node sampling.  She was staged at T1 N0  MX, stage I nonsmall cell carcinoma.  The patient has a significant for  chronic depression and on the night prior to admission decided consciously  to take 30 Vicodin tablets as well as 30 Xanax at approximately 9 p.m. and  then proceed onto bed without telling her spouse.  The patient presented to  the emergency room after her husband could not awaken her from sleep this  morning, she was somnolent and was found to have an increased Tylenol level.  Her husband found the Vicodin bottle as well as five crumpled notes on the  floor, expressing her wish to kill herself.   PAST MEDICAL HISTORY:  COPD, tobacco abuse, increased cholesterol,  gastroesophageal reflux disease, chronic depression, chronic sinusitis,  history of benign laryngeal nodule, and she has seen Dr. __________ for  assistance with her current cancer situation.  There are no plans at this  time for chemo or radiation.   PAST SURGICAL HISTORY:  None.   SOCIAL HISTORY:  She recently quit tobacco, however, her husband states on  the night prior to admission she resumed smoking and despite the patient's  admission to an occasional beer it was noted she brought a six-pack of beer  home and  proceeded to drink that.  She is married and has two children of  her own and one stepchild.   ALLERGIES:  SEPTRA.   MEDICATIONS (AS PER OLD MEDICAL RECORD NUMBER 1. Alprazolam 0.5 mg p.o. t.i.d. p.r.n.  2. Paroxetine 40 mg daily.  3. Aciphex 20 mg daily.  4. Mucinex 600 mg daily.  5. Zyrtec 10 mg daily.  6. Vicodin.  7. She also takes multivitamins, Coenzyme Q10, Ester C, and pomegranate     juice as well as calcium.   REVIEW OF SYSTEMS:  She complains of left upper quadrant pain which has been  chronic and did have a period of relapse prior to the diagnosis of her  cancer.  She denies nausea and vomiting at this time.  She denies melena and  hematochezia, but does have suicide ideation.  All other  review of systems  are negative.   PHYSICAL EXAMINATION:  VITAL SIGNS:  Temperature 97.2, blood pressure  initially was 87/59, this came up into the low 100s.  Pulse 78, respiratory  rate 18 but does get as low as 8-10 when the patient is somnolent.  She 90%  on room air.  GENERAL:  The patient is intermittently asleep and awake.  HEENT:  Normocephalic, atraumatic.  Pupils equal, round and reactive,  extraocular muscles are intact.  Mucous membranes are moist.  She has a  right ulcer in the posterior portion of her mouth where she has been chewing  and grinding her teeth.  NECK:  Supple, there is no JVD, no lymphadenopathy, no carotid bruit.  CHEST:  Decreased breath sounds left base greater than right, otherwise they  are clear with good air entry.  CARDIOVASCULAR:  Regular rate and rhythm, positive S1/S2, no S3 or S4, no  murmurs, rubs or gallops.  ABDOMEN:  Soft, nontender, non-distended with positive bowel sounds.  Although diffusely the abdomen is nontender she does complain of  intermittent right upper quadrant tenderness.  EXTREMITIES:  No clubbing, cyanosis or edema.  NEUROLOGIC:  The patient is awake, alert and oriented to person and place  but not time.  She clearly states she  has suicidal ideation and feels that  she has recently been treated like a piece of meat.  Her power is 5/5.  Deep tendon reflexes are 2+.  She has 2+ pulses and mildly slurred speech.   UDS is positive for opiates and benzodiazepines.  Her white count is 4.3,  hemoglobin 13.9, hematocrit 41, platelets 295,000.  Salicylate level is less  than 4.  Ethanol level is less than 5.  Tylenol level is 75.7, this is after  12 hours.  Her basic metabolic panel is within normal limits with a BUN of  13 and creatinine of 0.7.  LFTs at this time are also within normal limits.   ASSESSMENT AND PLAN:  This is a 60 year old white female with suicide  attempt using Xanax and Vicodin.  Her Tylenol level is in the toxic range 12  hours after ingestion.  She will be admitted to stepdown with suicide  precautions for treatment of Tylenol overdose.   PLAN:  1. Tylenol overdose.  Mucomyst protocol has been initiated at 70 mg/kg q.4h.     after a full load in the emergency room.  Please note that the patient's     weight calculation was slightly off, she therefore received a larger dose     of the Mucomyst load which should in now way be detrimental to her     health.  Her LFTs at this time are fortunately within normal limits.  We     will recheck her LFTs and Tylenol according to the protocol after the     dose of Mucomyst and discontinue the Mucomyst protocol when her Tylenol     level has reached 0 or the lowest possible range as long as her LFTs     remain within normal limits.  We will provide her with a sitting for     suicide precautions and observe for seizure precautions.  2. Gastrointestinal.  She will be n.p.o. for now.  When she is more awake,     we will advance her diet.  She will be treated with Protonix IV.  While     in the hospital we will attempt to explore the left upper quadrant pain  to find out whether this is related to GERD versus a complication of her    lung cancer which appears  to be on the same side.  3. GU:  The Foley is to gravity.  The patient does have a history of     urethral strictures which required frequent dilatations by Dr. Durwin Nora.  At     this time, a Foley has been placed without difficulty.  We will check a     UA C&S because her urine is discolored.  4. Cardiovascular.  At this time, we will provide supportive therapy,     hydrating her and closely monitoring her as     well as repeating her EKG in the morning.  5. Psychiatric consult will be made when the patient is cleared.  At this     time, we will hold her Paxil and when she is more awake and alert we will     most likely resume this when she is taking p.o.                                               Melissa L. Ladona Ridgel, MD    MLT/MEDQ  D:  08/07/2003  T:  08/07/2003  Job:  [id number]   cc:   Jamas Lav, M.D.  [address]  [place], [place] [address]  Fax: [phone]   Duncan Dull, M.D.  [address]  [place]  [place] [address]  Fax: [phone]

## 2010-06-21 NOTE — Op Note (Signed)
NAME:  Tammy Eaton, Tammy Eaton                          ACCOUNT NO.:  [id number]   MEDICAL RECORD NO.:  [id number]                   PATIENT TYPE:  INP   LOCATION:                                       FACILITY:  [facility]   PHYSICIAN:  Ines Bloomer, M.D.              DATE OF BIRTH:  [birth year]   DATE OF PROCEDURE:  DATE OF DISCHARGE:  05/26/2003                                 OPERATIVE REPORT   PREOPERATIVE DIAGNOSIS:  Left upper lobe mass.   POSTOPERATIVE DIAGNOSIS:  Adenocarcinoma, left upper lobe.   OPERATION PERFORMED:  Wedge resection of left upper lobe with left upper  lobectomy and node dissection and left thoracotomy and left video-assisted  thoracoscopic surgery.   SURGEON:  Ines Bloomer, M.D.   ASSISTANT:  Rowe Clack, P.A.-C.   General anesthesia.   After percutaneous insertion of all monitoring lines, the patient was turned  in the left lateral thoracotomy position and was prepped and draped in the  usual sterile manner.  A dual-lumen tube had been inserted and the left lung  was deflated.  Two trocar sites were made in the anterior and posterior  axillary lines in the seventh intercostal space and two trocars were  inserted.  The lesion was seen in the apical posterior segment of the left  upper lobe.  A 4-6 cm incision was made over the fifth interspace, the  platysma was partially divided, the serratus was reflected anteriorly, and a  small Tuffier was placed in the space.  The lesion was grasped with a Kaiser  ring forceps and resected with the EZ-45 stapler with several passes.  Frozen section revealed adenocarcinoma.  Left upper lobectomy was then done,  extending the incision to about 10 cm and putting a Tuffier at right angles.  The resection started posteriorly, dissecting out the pulmonary artery and  dissecting out several 5 and 6 nodes from the aortopulmonary window and a  10L node.  Then dissection was carried around the superior mediastinum,  dissecting out the superior pulmonary vein and looping it with the vascular  tape.  The apical posterior branch was dissected out and also looped with a  vascular tape.  Attention was then turned to the fissure, and this was  dissected down to the lingular branches, which was dissected out and sutured  with the AutoSuture stapler.  The inferior portion of the fissure was then  divided with the EZ-45 stapler.  Then going superiorly, the superior portion  of the fissure was then dissected free from the pulmonary artery and divided  with the EZ-45 stapler.  Next the anterior branch, which was small, was  doubly ligated with 2-0 silk and divided and a large apical posterior branch  was dissected up, and both of them were divided with the AutoSuture 30 wide  Roticulator.  The superior pulmonary vein was then divided with the  AutoSuture 30  wide Roticulator.  Several 10L and 11L nodes were dissected  free from around the bronchus and then the bronchus was stapled with the TL-  30 stapler and divided with knife and the left upper lobe was removed.  The  inferior pulmonary ligament was taken down.  All bleeding was  electrocoagulated.  Two chest tubes were brought into the trocar sites and  tied in place with 0 silk.  A Marcaine block was done in the usual fashion.  On-Q catheters were placed in the paracostals space in the usual fashion.  The chest was closed with four paracostals, #1 Vicryl in the muscle layer, 2-  0  Vicryl in the subcutaneous tissue.  The frozen section revealed that the  bronchial margins were negative and this was an adenocarcinoma,  bronchoalveolar type.  The Dermabond was used for the skin and the patient  was  returned to the recovery room in stable condition.                                               Ines Bloomer, M.D.    DPB/MEDQ  D:  08/12/2003  T:  08/14/2003  Job:  [id number]

## 2010-11-29 ENCOUNTER — Other Ambulatory Visit (HOSPITAL_COMMUNITY): Payer: Self-pay | Admitting: Oncology

## 2010-11-29 ENCOUNTER — Encounter (HOSPITAL_BASED_OUTPATIENT_CLINIC_OR_DEPARTMENT_OTHER): Payer: 59 | Admitting: Oncology

## 2010-11-29 DIAGNOSIS — F17200 Nicotine dependence, unspecified, uncomplicated: Secondary | ICD-10-CM

## 2010-11-29 DIAGNOSIS — Z85118 Personal history of other malignant neoplasm of bronchus and lung: Secondary | ICD-10-CM

## 2010-11-29 DIAGNOSIS — J449 Chronic obstructive pulmonary disease, unspecified: Secondary | ICD-10-CM

## 2010-11-29 DIAGNOSIS — C3410 Malignant neoplasm of upper lobe, unspecified bronchus or lung: Secondary | ICD-10-CM

## 2010-11-29 DIAGNOSIS — J4489 Other specified chronic obstructive pulmonary disease: Secondary | ICD-10-CM

## 2010-11-29 LAB — CBC WITH DIFFERENTIAL/PLATELET
BASO%: 0.3 % (ref 0.0–2.0)
EOS%: 1.1 % (ref 0.0–7.0)
LYMPH%: 22.5 % (ref 14.0–49.7)
MCH: 30.5 pg (ref 25.1–34.0)
MCHC: 34.5 g/dL (ref 31.5–36.0)
MCV: 88.4 fL (ref 79.5–101.0)
MONO%: 8.3 % (ref 0.0–14.0)
Platelets: 239 10*3/uL (ref 145–400)
RBC: 4.84 10*6/uL (ref 3.70–5.45)
WBC: 4.8 10*3/uL (ref 3.9–10.3)

## 2010-11-29 LAB — COMPREHENSIVE METABOLIC PANEL
ALT: 15 U/L (ref 0–35)
Alkaline Phosphatase: 65 U/L (ref 39–117)
Sodium: 140 mEq/L (ref 135–145)
Total Bilirubin: 0.8 mg/dL (ref 0.3–1.2)
Total Protein: 6.7 g/dL (ref 6.0–8.3)

## 2011-01-10 ENCOUNTER — Other Ambulatory Visit: Payer: Self-pay | Admitting: Obstetrics & Gynecology

## 2011-01-10 DIAGNOSIS — Z1231 Encounter for screening mammogram for malignant neoplasm of breast: Secondary | ICD-10-CM

## 2011-02-06 ENCOUNTER — Other Ambulatory Visit (HOSPITAL_COMMUNITY): Payer: Self-pay | Admitting: Family Medicine

## 2011-02-06 DIAGNOSIS — J449 Chronic obstructive pulmonary disease, unspecified: Secondary | ICD-10-CM

## 2011-02-06 DIAGNOSIS — C3490 Malignant neoplasm of unspecified part of unspecified bronchus or lung: Secondary | ICD-10-CM

## 2011-02-06 DIAGNOSIS — R0602 Shortness of breath: Secondary | ICD-10-CM

## 2011-02-07 ENCOUNTER — Ambulatory Visit
Admission: RE | Admit: 2011-02-07 | Discharge: 2011-02-07 | Disposition: A | Discharge status: Home / Self Care | Payer: 59 | Source: Ambulatory Visit | Attending: Obstetrics & Gynecology | Admitting: Obstetrics & Gynecology

## 2011-02-07 DIAGNOSIS — Z1231 Encounter for screening mammogram for malignant neoplasm of breast: Secondary | ICD-10-CM

## 2011-02-14 ENCOUNTER — Encounter (HOSPITAL_COMMUNITY): Payer: Self-pay

## 2011-02-14 ENCOUNTER — Ambulatory Visit (HOSPITAL_COMMUNITY)
Admission: RE | Admit: 2011-02-14 | Discharge: 2011-02-14 | Disposition: A | Discharge status: Home / Self Care | Payer: 59 | Source: Ambulatory Visit | Attending: Family Medicine | Admitting: Family Medicine

## 2011-02-14 DIAGNOSIS — J449 Chronic obstructive pulmonary disease, unspecified: Secondary | ICD-10-CM

## 2011-02-14 DIAGNOSIS — R0602 Shortness of breath: Secondary | ICD-10-CM | POA: Insufficient documentation

## 2011-02-14 DIAGNOSIS — C3490 Malignant neoplasm of unspecified part of unspecified bronchus or lung: Secondary | ICD-10-CM

## 2011-02-14 DIAGNOSIS — J4489 Other specified chronic obstructive pulmonary disease: Secondary | ICD-10-CM | POA: Insufficient documentation

## 2011-02-14 HISTORY — DX: Malignant (primary) neoplasm, unspecified: C80.1

## 2011-02-14 MED ORDER — IOHEXOL 300 MG/ML  SOLN
80.0000 mL | Freq: Once | INTRAMUSCULAR | Status: AC | PRN
  Administered 2011-02-14: 80 mL via INTRAVENOUS

## 2011-05-23 ENCOUNTER — Telehealth: Payer: Self-pay | Admitting: *Deleted

## 2011-05-23 NOTE — Telephone Encounter (Signed)
patient called in and scheduled appointment with on  11-27-2011 patient confirmed over the phone

## 2011-09-16 ENCOUNTER — Other Ambulatory Visit: Payer: Self-pay | Admitting: Family Medicine

## 2011-09-16 DIAGNOSIS — R109 Unspecified abdominal pain: Secondary | ICD-10-CM

## 2011-09-18 ENCOUNTER — Ambulatory Visit
Admission: RE | Admit: 2011-09-18 | Discharge: 2011-09-18 | Disposition: A | Discharge status: Home / Self Care | Payer: 59 | Source: Ambulatory Visit | Attending: Family Medicine | Admitting: Family Medicine

## 2011-09-18 DIAGNOSIS — R109 Unspecified abdominal pain: Secondary | ICD-10-CM

## 2011-09-22 ENCOUNTER — Ambulatory Visit (INDEPENDENT_AMBULATORY_CARE_PROVIDER_SITE_OTHER): Payer: 59 | Admitting: General Surgery

## 2011-09-22 ENCOUNTER — Encounter (INDEPENDENT_AMBULATORY_CARE_PROVIDER_SITE_OTHER): Payer: Self-pay | Admitting: General Surgery

## 2011-09-22 VITALS — BP 110/72 | HR 74 | Temp 98.6°F | Ht 66.5 in | Wt 131.6 lb

## 2011-09-22 DIAGNOSIS — K8020 Calculus of gallbladder without cholecystitis without obstruction: Secondary | ICD-10-CM | POA: Insufficient documentation

## 2011-09-22 NOTE — Patient Instructions (Signed)
Low fat diet

## 2011-09-22 NOTE — Progress Notes (Signed)
Patient ID: Tammy Eaton, female   DOB: [birth year], 61 y.o.   MRN: [id number]  Chief Complaint  Patient presents with  . Pre-op Exam    Eval gb with stones    HPI Tammy Eaton is a 61 y.o. female.   HPI  She is referred by Dr. Shaune Pollack for evaluation of epigastric abdominal discomfort and gallstones. She initially developed some left scapular pain and shoulder pain. She also had some intermittent pressure-type epigastric pain with belching. She had a cardiac workup for her left scapular and shoulder pain which was unremarkable. This slowly got better with time. The pain is next to a previous thoracotomy scar. She still has some epigastric bloating specifically when she eats and belching. Pepcid helped this but does not completely relieve the pain. An abdominal ultrasound demonstrated multiple gallstones (greater than 10) with no gallbladder wall thickening or choledocholithiasis. She is here to discuss cholecystectomy. She does have a appointment with one the gastroenterologist, Dr. Bosie Clos, in the near future. No history of jaundice or liver disease.  Past Medical History  Diagnosis Date  . Cancer     lung ca  . Arthritis   . COPD (chronic obstructive pulmonary disease)   . Emphysema of lung   . Hyperlipidemia     Past Surgical History  Procedure Date  . Lung lobectomy   . Sinus surgery with instatrak   . Joint replacement   . Tubal ligation   . Breast surgery   . Dilation and curettage of uterus   . Eye surgery     Family History  Problem Relation Age of Onset  . Cancer Mother     ovarian cancer  . Alcohol abuse Father     Social History History  Substance Use Topics  . Smoking status: Former Smoker    Quit date: 09/21/2008  . Smokeless tobacco: Not on file  . Alcohol Use: Yes     SOCIALLY    Allergies  Allergen Reactions  . Amoxicillin     thrush  . Septra (Sulfamethoxazole W-Trimethoprim) Hives    Current Outpatient Prescriptions  Medication Sig  Dispense Refill  . ALPRAZolam (XANAX) 0.5 MG tablet       . aspirin 81 MG tablet Take 81 mg by mouth daily.      . calcium-vitamin D (OSCAL WITH D) 500-200 MG-UNIT per tablet Take 1 tablet by mouth daily.      . CYMBALTA 60 MG capsule       . fish oil-omega-3 fatty acids 1000 MG capsule Take 2 g by mouth daily.      . Glucosamine Sulfate 750 MG TABS Take 750 mg by mouth 2 (two) times daily.      Alfonso Patten 2 MG TABS       . Magnesium 500 MG CAPS Take by mouth.      . Misc Natural Products (TART CHERRY ADVANCED PO) Take by mouth.      . Multiple Vitamins-Minerals (MULTIVITAMIN WITH MINERALS) tablet Take 1 tablet by mouth daily.      . niacin 500 MG tablet Take 500 mg by mouth 2 (two) times daily with a meal.      . Vitamin D, Ergocalciferol, (DRISDOL) 50000 UNITS CAPS         Review of Systems Review of Systems  Constitutional: Negative.   HENT: Negative for hearing loss.   Respiratory: Negative.   Cardiovascular: Positive for chest pain (negative cardiac workup).  Gastrointestinal: Positive for abdominal pain and  abdominal distention. Negative for vomiting and diarrhea.  Musculoskeletal: Positive for back pain (left scapular area).    Blood pressure 110/72, pulse 74, temperature 98.6 F (37 C), temperature source Temporal, height 5' 6.5" (1.689 m), weight 131 lb 9.6 oz (59.693 kg).  Physical Exam Physical Exam  Constitutional: She appears well-developed and well-nourished. No distress.  HENT:  Head: Normocephalic and atraumatic.  Eyes: EOM are normal. No scleral icterus.  Cardiovascular: Normal rate and regular rhythm.   Pulmonary/Chest:       Left chest wall scar.  Breath sounds are distant but clear.  Abdominal: Soft. She exhibits no distension and no mass. There is no tenderness.  Musculoskeletal: She exhibits no edema.  Skin: Skin is warm and dry.    Data Reviewed Dr. Kevan Ny' note, [place] report.  Assessment    Epigastric abdominal pain and cholelithiasis with multiple  gallstones.  Pain is suggestive of biilary colic.    Plan    We discussed laparoscopic cholecystectomy but I feel she needs to see Dr. Bosie Clos first to see if and EGD is indicated.  After her GI work up is complete, we could then proceed with cholecystectomy.  I have explained the procedure, risks, and aftercare of cholecystectomy.  Risks include but are not limited to bleeding, infection, wound problems, anesthesia, diarrhea, bile leak, injury to common bile duct/liver/intestine.  She seems to understand and agrees with the plan.  I will wait for Dr. Marge Duncans evaluation prior to scheduling any surgery.        Kyona Chauncey J 09/22/2011, 6:00 PM

## 2011-10-09 ENCOUNTER — Other Ambulatory Visit: Payer: Self-pay | Admitting: Gastroenterology

## 2011-10-21 ENCOUNTER — Encounter (INDEPENDENT_AMBULATORY_CARE_PROVIDER_SITE_OTHER): Payer: Self-pay

## 2011-10-27 ENCOUNTER — Encounter (HOSPITAL_COMMUNITY): Payer: Self-pay | Admitting: Pharmacy Technician

## 2011-10-28 ENCOUNTER — Encounter (INDEPENDENT_AMBULATORY_CARE_PROVIDER_SITE_OTHER): Payer: Self-pay

## 2011-10-30 ENCOUNTER — Other Ambulatory Visit (INDEPENDENT_AMBULATORY_CARE_PROVIDER_SITE_OTHER): Payer: Self-pay | Admitting: General Surgery

## 2011-10-31 ENCOUNTER — Encounter (HOSPITAL_COMMUNITY)
Admission: RE | Admit: 2011-10-31 | Discharge: 2011-10-31 | Disposition: A | Discharge status: Home / Self Care | Payer: 59 | Source: Ambulatory Visit | Attending: General Surgery | Admitting: General Surgery

## 2011-10-31 ENCOUNTER — Encounter (HOSPITAL_COMMUNITY): Payer: Self-pay

## 2011-10-31 HISTORY — DX: Nausea with vomiting, unspecified: R11.2

## 2011-10-31 HISTORY — DX: Other specified postprocedural states: Z98.890

## 2011-10-31 LAB — COMPREHENSIVE METABOLIC PANEL
ALT: 13 U/L (ref 0–35)
BUN: 11 mg/dL (ref 6–23)
CO2: 29 mEq/L (ref 19–32)
Calcium: 9.6 mg/dL (ref 8.4–10.5)
GFR calc Af Amer: >90 mL/min (ref >90)
GFR calc non Af Amer: >90 mL/min (ref >90)
Glucose, Bld: 83 mg/dL (ref 70–99)
Sodium: 142 mEq/L (ref 135–145)
Total Protein: 7 g/dL (ref 6.0–8.3)

## 2011-10-31 LAB — CBC WITH DIFFERENTIAL/PLATELET
Eosinophils Absolute: 0.1 10*3/uL (ref 0.0–0.7)
Eosinophils Relative: 3 % (ref 0–5)
HCT: 45.5 % (ref 36.0–46.0)
Hemoglobin: 15.5 g/dL — ABNORMAL HIGH (ref 12.0–15.0)
Lymphocytes Relative: 38 % (ref 12–46)
Lymphs Abs: 1.9 10*3/uL (ref 0.7–4.0)
MCH: 30 pg (ref 26.0–34.0)
MCV: 88 fL (ref 78.0–100.0)
Monocytes Absolute: 0.5 10*3/uL (ref 0.1–1.0)
Monocytes Relative: 9 % (ref 3–12)
Platelets: 207 10*3/uL (ref 150–400)
RBC: 5.17 MIL/uL — ABNORMAL HIGH (ref 3.87–5.11)
WBC: 5 10*3/uL (ref 4.0–10.5)

## 2011-10-31 LAB — PROTIME-INR: Prothrombin Time: 12.5 seconds (ref 11.6–15.2)

## 2011-10-31 NOTE — Pre-Procedure Instructions (Signed)
20 Tammy Eaton  10/31/2011   Your procedure is scheduled on:  November 11, 2011  Report to [facility] at 5:30 AM.  Call this number if you have problems the morning of surgery: [phone]   Remember:   Do not eat food:After Midnight.  Take these medicines the morning of surgery with A SIP OF WATER: xanax as needed, cymbalta, aciphex    DISCONTINUE ASPIRIN, COUMADIN, PLAVIX, EFFIENT, HERBAL MEDICATIONS 7 DAYS PRIOR TO SURGERY   Do not wear jewelry, make-up or nail polish.  Do not wear lotions, powders, or perfumes. You may wear deodorant.  Do not shave 48 hours prior to surgery. Men may shave face and neck.  Do not bring valuables to the hospital.  Contacts, dentures or bridgework may not be worn into surgery.  Leave suitcase in the car. After surgery it may be brought to your room.  For patients admitted to the hospital, checkout time is 11:00 AM the day of discharge.   Patients discharged the day of surgery will not be allowed to drive home.  Name and phone number of your driver:   Special Instructions: Shower using CHG 2 nights before surgery and the night before surgery.  If you shower the day of surgery use CHG.  Use special wash - you have one bottle of CHG for all showers.  You should use approximately 1/3 of the bottle for each shower.   Please read over the following fact sheets that you were given: Pain Booklet, Coughing and Deep Breathing and Surgical Site Infection Prevention

## 2011-11-10 MED ORDER — CIPROFLOXACIN IN D5W 400 MG/200ML IV SOLN
400.0000 mg | INTRAVENOUS | Status: AC
  Administered 2011-11-11: 400 mg via INTRAVENOUS
  Filled 2011-11-10: qty 200

## 2011-11-11 ENCOUNTER — Encounter (HOSPITAL_COMMUNITY): Payer: Self-pay | Admitting: *Deleted

## 2011-11-11 ENCOUNTER — Encounter (HOSPITAL_COMMUNITY)
Admission: RE | Disposition: A | Discharge status: Home / Self Care | Payer: Self-pay | Source: Ambulatory Visit | Attending: General Surgery

## 2011-11-11 ENCOUNTER — Ambulatory Visit (HOSPITAL_COMMUNITY)
Admission: RE | Admit: 2011-11-11 | Discharge: 2011-11-11 | Disposition: A | Discharge status: Home / Self Care | Payer: 59 | Source: Ambulatory Visit | Attending: General Surgery | Admitting: General Surgery

## 2011-11-11 ENCOUNTER — Ambulatory Visit (HOSPITAL_COMMUNITY): Payer: 59 | Admitting: Certified Registered Nurse Anesthetist

## 2011-11-11 ENCOUNTER — Encounter (HOSPITAL_COMMUNITY): Payer: Self-pay | Admitting: Certified Registered Nurse Anesthetist

## 2011-11-11 ENCOUNTER — Telehealth (INDEPENDENT_AMBULATORY_CARE_PROVIDER_SITE_OTHER): Payer: Self-pay | Admitting: General Surgery

## 2011-11-11 ENCOUNTER — Ambulatory Visit (HOSPITAL_COMMUNITY): Payer: 59

## 2011-11-11 DIAGNOSIS — K8010 Calculus of gallbladder with chronic cholecystitis without obstruction: Secondary | ICD-10-CM

## 2011-11-11 DIAGNOSIS — Z01812 Encounter for preprocedural laboratory examination: Secondary | ICD-10-CM | POA: Insufficient documentation

## 2011-11-11 DIAGNOSIS — Z85118 Personal history of other malignant neoplasm of bronchus and lung: Secondary | ICD-10-CM | POA: Insufficient documentation

## 2011-11-11 DIAGNOSIS — K8020 Calculus of gallbladder without cholecystitis without obstruction: Secondary | ICD-10-CM

## 2011-11-11 DIAGNOSIS — J4489 Other specified chronic obstructive pulmonary disease: Secondary | ICD-10-CM | POA: Insufficient documentation

## 2011-11-11 DIAGNOSIS — J449 Chronic obstructive pulmonary disease, unspecified: Secondary | ICD-10-CM | POA: Insufficient documentation

## 2011-11-11 HISTORY — PX: CHOLECYSTECTOMY: SHX55

## 2011-11-11 SURGERY — LAPAROSCOPIC CHOLECYSTECTOMY WITH INTRAOPERATIVE CHOLANGIOGRAM
Anesthesia: General | Site: Abdomen | Wound class: Clean Contaminated

## 2011-11-11 MED ORDER — SCOPOLAMINE 1 MG/3DAYS TD PT72
MEDICATED_PATCH | TRANSDERMAL | Status: DC | PRN
  Administered 2011-11-11: 1 via TRANSDERMAL

## 2011-11-11 MED ORDER — ACETAMINOPHEN 325 MG PO TABS: 650.0000 mg | ORAL_TABLET | ORAL | Status: DC | PRN

## 2011-11-11 MED ORDER — MORPHINE SULFATE 4 MG/ML IJ SOLN
INTRAMUSCULAR | Status: AC
  Administered 2011-11-11: 4 mg
  Filled 2011-11-11: qty 1

## 2011-11-11 MED ORDER — DEXAMETHASONE SODIUM PHOSPHATE 4 MG/ML IJ SOLN
INTRAMUSCULAR | Status: DC | PRN
  Administered 2011-11-11: 4 mg via INTRAVENOUS

## 2011-11-11 MED ORDER — OXYCODONE HCL 5 MG PO TABS
ORAL_TABLET | ORAL | Status: AC
  Administered 2011-11-11: 5 mg via ORAL
  Filled 2011-11-11: qty 1

## 2011-11-11 MED ORDER — SODIUM CHLORIDE 0.9 % IV SOLN
INTRAVENOUS | Status: DC | PRN
  Administered 2011-11-11: 08:00:00

## 2011-11-11 MED ORDER — GLYCOPYRROLATE 0.2 MG/ML IJ SOLN
INTRAMUSCULAR | Status: DC | PRN
  Administered 2011-11-11: .6 mg via INTRAVENOUS

## 2011-11-11 MED ORDER — ONDANSETRON HCL 4 MG/2ML IJ SOLN
4.0000 mg | Freq: Four times a day (QID) | INTRAMUSCULAR | Status: DC | PRN
  Administered 2011-11-11: 4 mg via INTRAVENOUS

## 2011-11-11 MED ORDER — ONDANSETRON HCL 4 MG/2ML IJ SOLN
INTRAMUSCULAR | Status: DC | PRN
  Administered 2011-11-11: 4 mg via INTRAVENOUS

## 2011-11-11 MED ORDER — ROCURONIUM BROMIDE 100 MG/10ML IV SOLN
INTRAVENOUS | Status: DC | PRN
  Administered 2011-11-11: 40 mg via INTRAVENOUS
  Administered 2011-11-11: 5 mg via INTRAVENOUS

## 2011-11-11 MED ORDER — EPHEDRINE SULFATE 50 MG/ML IJ SOLN
INTRAMUSCULAR | Status: DC | PRN
  Administered 2011-11-11 (×2): 10 mg via INTRAVENOUS

## 2011-11-11 MED ORDER — SODIUM CHLORIDE 0.9 % IJ SOLN: 3.0000 mL | INTRAMUSCULAR | Status: DC | PRN

## 2011-11-11 MED ORDER — OXYCODONE HCL 5 MG PO TABS
5.0000 mg | ORAL_TABLET | ORAL | Status: DC | PRN
  Administered 2011-11-11: 5 mg via ORAL

## 2011-11-11 MED ORDER — OXYCODONE-ACETAMINOPHEN 5-325 MG PO TABS: 1.0000 | ORAL_TABLET | ORAL | Status: DC | PRN

## 2011-11-11 MED ORDER — DEXTROSE IN LACTATED RINGERS 5 % IV SOLN: INTRAVENOUS | Status: DC

## 2011-11-11 MED ORDER — NEOSTIGMINE METHYLSULFATE 1 MG/ML IJ SOLN
INTRAMUSCULAR | Status: DC | PRN
  Administered 2011-11-11: 4 mg via INTRAVENOUS

## 2011-11-11 MED ORDER — MIDAZOLAM HCL 5 MG/5ML IJ SOLN
INTRAMUSCULAR | Status: DC | PRN
  Administered 2011-11-11: 2 mg via INTRAVENOUS

## 2011-11-11 MED ORDER — FENTANYL CITRATE 0.05 MG/ML IJ SOLN
INTRAMUSCULAR | Status: DC | PRN
  Administered 2011-11-11 (×2): 50 ug via INTRAVENOUS
  Administered 2011-11-11: 100 ug via INTRAVENOUS

## 2011-11-11 MED ORDER — LACTATED RINGERS IV SOLN
INTRAVENOUS | Status: DC | PRN
  Administered 2011-11-11: 08:00:00 via INTRAVENOUS

## 2011-11-11 MED ORDER — MORPHINE SULFATE 2 MG/ML IJ SOLN: 2.0000 mg | INTRAMUSCULAR | Status: DC | PRN

## 2011-11-11 MED ORDER — BUPIVACAINE-EPINEPHRINE 0.25% -1:200000 IJ SOLN
INTRAMUSCULAR | Status: DC | PRN
  Administered 2011-11-11: 20 mL

## 2011-11-11 MED ORDER — SODIUM CHLORIDE 0.9 % IR SOLN
Status: DC | PRN
  Administered 2011-11-11: 1

## 2011-11-11 MED ORDER — SCOPOLAMINE 1 MG/3DAYS TD PT72
MEDICATED_PATCH | TRANSDERMAL | Status: AC
  Filled 2011-11-11: qty 1

## 2011-11-11 MED ORDER — ONDANSETRON HCL 4 MG/2ML IJ SOLN
INTRAMUSCULAR | Status: AC
  Administered 2011-11-11: 4 mg via INTRAVENOUS
  Filled 2011-11-11: qty 2

## 2011-11-11 MED ORDER — LIDOCAINE HCL (CARDIAC) 20 MG/ML IV SOLN
INTRAVENOUS | Status: DC | PRN
  Administered 2011-11-11: 70 mg via INTRAVENOUS

## 2011-11-11 MED ORDER — BUPIVACAINE-EPINEPHRINE PF 0.25-1:200000 % IJ SOLN
INTRAMUSCULAR | Status: AC
  Filled 2011-11-11: qty 30

## 2011-11-11 MED ORDER — HYDROMORPHONE HCL PF 1 MG/ML IJ SOLN: 0.2500 mg | INTRAMUSCULAR | Status: DC | PRN

## 2011-11-11 MED ORDER — ACETAMINOPHEN 650 MG RE SUPP: 650.0000 mg | RECTAL | Status: DC | PRN

## 2011-11-11 MED ORDER — SODIUM CHLORIDE 0.9 % IV SOLN: 250.0000 mL | INTRAVENOUS | Status: DC | PRN

## 2011-11-11 MED ORDER — SODIUM CHLORIDE 0.9 % IJ SOLN: 3.0000 mL | Freq: Two times a day (BID) | INTRAMUSCULAR | Status: DC

## 2011-11-11 MED ORDER — 0.9 % SODIUM CHLORIDE (POUR BTL) OPTIME
TOPICAL | Status: DC | PRN
  Administered 2011-11-11: 1000 mL

## 2011-11-11 MED ORDER — PHENYLEPHRINE HCL 10 MG/ML IJ SOLN
INTRAMUSCULAR | Status: DC | PRN
  Administered 2011-11-11: 40 ug via INTRAVENOUS

## 2011-11-11 SURGICAL SUPPLY — 40 items
APPLIER CLIP 5 13 M/L LIGAMAX5 (MISCELLANEOUS) ×2
BENZOIN TINCTURE PRP APPL 2/3 (GAUZE/BANDAGES/DRESSINGS) ×2 IMPLANT
CANISTER SUCTION 2500CC (MISCELLANEOUS) ×2 IMPLANT
CHLORAPREP W/TINT 26ML (MISCELLANEOUS) ×2 IMPLANT
CLIP APPLIE 5 13 M/L LIGAMAX5 (MISCELLANEOUS) ×1 IMPLANT
CLOTH BEACON ORANGE TIMEOUT ST (SAFETY) ×2 IMPLANT
COVER MAYO STAND STRL (DRAPES) ×2 IMPLANT
COVER SURGICAL LIGHT HANDLE (MISCELLANEOUS) ×2 IMPLANT
DECANTER SPIKE VIAL GLASS SM (MISCELLANEOUS) IMPLANT
DRAPE C-ARM 42X72 X-RAY (DRAPES) ×2 IMPLANT
ELECT REM PT RETURN 9FT ADLT (ELECTROSURGICAL) ×2
ELECTRODE REM PT RTRN 9FT ADLT (ELECTROSURGICAL) ×1 IMPLANT
GAUZE SPONGE 2X2 8PLY STRL LF (GAUZE/BANDAGES/DRESSINGS) ×1 IMPLANT
GLOVE BIOGEL PI IND STRL 7.0 (GLOVE) ×1 IMPLANT
GLOVE BIOGEL PI IND STRL 8 (GLOVE) ×1 IMPLANT
GLOVE BIOGEL PI INDICATOR 7.0 (GLOVE) ×1
GLOVE BIOGEL PI INDICATOR 8 (GLOVE) ×1
GLOVE ECLIPSE 8.0 STRL XLNG CF (GLOVE) ×2 IMPLANT
GLOVE SURG ORTHO 8.0 STRL STRW (GLOVE) ×2 IMPLANT
GLOVE SURG SS PI 6.5 STRL IVOR (GLOVE) ×2 IMPLANT
GOWN STRL NON-REIN LRG LVL3 (GOWN DISPOSABLE) ×6 IMPLANT
GOWN STRL REIN XL XLG (GOWN DISPOSABLE) ×2 IMPLANT
KIT BASIN OR (CUSTOM PROCEDURE TRAY) ×2 IMPLANT
KIT ROOM TURNOVER OR (KITS) ×2 IMPLANT
NS IRRIG 1000ML POUR BTL (IV SOLUTION) ×2 IMPLANT
PAD ARMBOARD 7.5X6 YLW CONV (MISCELLANEOUS) ×2 IMPLANT
POUCH SPECIMEN RETRIEVAL 10MM (ENDOMECHANICALS) ×2 IMPLANT
SCISSORS LAP 5X35 DISP (ENDOMECHANICALS) IMPLANT
SET CHOLANGIOGRAPH 5 50 .035 (SET/KITS/TRAYS/PACK) ×2 IMPLANT
SET IRRIG TUBING LAPAROSCOPIC (IRRIGATION / IRRIGATOR) ×2 IMPLANT
SLEEVE ENDOPATH XCEL 5M (ENDOMECHANICALS) ×4 IMPLANT
SPECIMEN JAR SMALL (MISCELLANEOUS) ×2 IMPLANT
SPONGE GAUZE 2X2 STER 10/PKG (GAUZE/BANDAGES/DRESSINGS) ×1
SUT MON AB 4-0 PC3 18 (SUTURE) ×2 IMPLANT
TOWEL OR 17X24 6PK STRL BLUE (TOWEL DISPOSABLE) ×2 IMPLANT
TOWEL OR 17X26 10 PK STRL BLUE (TOWEL DISPOSABLE) ×2 IMPLANT
TRAY LAPAROSCOPIC (CUSTOM PROCEDURE TRAY) ×2 IMPLANT
TROCAR XCEL BLUNT TIP 100MML (ENDOMECHANICALS) ×2 IMPLANT
TROCAR XCEL NON-BLD 11X100MML (ENDOMECHANICALS) IMPLANT
TROCAR XCEL NON-BLD 5MMX100MML (ENDOMECHANICALS) ×2 IMPLANT

## 2011-11-11 NOTE — Telephone Encounter (Signed)
Called and left message for patient to advise of post op appointment set up for 12/02/11 at 11:50. Advised patient to return call to obtain appointment information.

## 2011-11-11 NOTE — Preoperative (Signed)
Beta Blockers   Reason not to administer Beta Blockers:Not Applicable 

## 2011-11-11 NOTE — Anesthesia Preprocedure Evaluation (Signed)
Anesthesia Evaluation  Patient identified by MRN, date of birth, ID band Patient awake    Reviewed: Allergy & Precautions, H&P , NPO status , Patient's Chart, lab work & pertinent test results  History of Anesthesia Complications (+) PONV  Airway Mallampati: II      Dental   Pulmonary COPD breath sounds clear to auscultation        Cardiovascular negative cardio ROS  Rhythm:Regular Rate:Normal     Neuro/Psych    GI/Hepatic Neg liver ROS, History of gallstones   Endo/Other  negative endocrine ROS  Renal/GU negative Renal ROS     Musculoskeletal   Abdominal   Peds  Hematology   Anesthesia Other Findings   Reproductive/Obstetrics                           Anesthesia Physical Anesthesia Plan  ASA: III  Anesthesia Plan: General   Post-op Pain Management:    Induction: Intravenous  Airway Management Planned: Oral ETT  Additional Equipment:   Intra-op Plan:   Post-operative Plan:   Informed Consent: I have reviewed the patients History and Physical, chart, labs and discussed the procedure including the risks, benefits and alternatives for the proposed anesthesia with the patient or authorized representative who has indicated his/her understanding and acceptance.     Plan Discussed with: CRNA, Surgeon and Anesthesiologist  Anesthesia Plan Comments:         Anesthesia Quick Evaluation

## 2011-11-11 NOTE — Anesthesia Postprocedure Evaluation (Signed)
  Anesthesia Post-op Note  Patient: Tammy Eaton  Procedure(s) Performed: Procedure(s) (LRB) with comments: LAPAROSCOPIC CHOLECYSTECTOMY WITH INTRAOPERATIVE CHOLANGIOGRAM (N/A)  Patient Location: PACU  Anesthesia Type: General  Level of Consciousness: awake  Airway and Oxygen Therapy: Patient Spontanous Breathing  Post-op Pain: mild  Post-op Assessment: Post-op Vital signs reviewed  Post-op Vital Signs: Reviewed  Complications: No apparent anesthesia complications

## 2011-11-11 NOTE — Transfer of Care (Signed)
Immediate Anesthesia Transfer of Care Note  Patient: Tammy Eaton  Procedure(s) Performed: Procedure(s) (LRB) with comments: LAPAROSCOPIC CHOLECYSTECTOMY WITH INTRAOPERATIVE CHOLANGIOGRAM (N/A)  Patient Location: PACU  Anesthesia Type: General  Level of Consciousness: awake  Airway & Oxygen Therapy: Patient Spontanous Breathing  Post-op Assessment: Report given to PACU RN  Post vital signs: Reviewed  Complications: No apparent anesthesia complications

## 2011-11-11 NOTE — H&P (Signed)
Tammy Eaton is an 61 y.o. female.   Chief Complaint: Here for elective cholecystectomy. HPI: She has upper abdominal pain and gallstones.  Abdominal pain is consistent with biliary colic type pain.   She also has some gastritis on EGD. She is here for elective lap chole.  Past Medical History  Diagnosis Date  . Cancer     lung ca  . Arthritis   . COPD (chronic obstructive pulmonary disease)   . Emphysema of lung   . Hyperlipidemia   . PONV (postoperative nausea and vomiting)     pt "could not eat during hospital stay"    Past Surgical History  Procedure Date  . Lung lobectomy   . Sinus surgery with instatrak   . Tubal ligation   . Breast surgery   . Dilation and curettage of uterus   . Eye surgery   . Vocal cord lateralization, endoscopic approach w/ mlb     nodule removed  . Knee surgery     rt knee torn cartilage    Family History  Problem Relation Age of Onset  . Cancer Mother     ovarian cancer  . Alcohol abuse Father    Social History:  reports that she quit smoking about 3 years ago. She does not have any smokeless tobacco history on file. She reports that she drinks alcohol. She reports that she does not use illicit drugs.  Allergies:  Allergies  Allergen Reactions  . Amoxicillin     thrush  . Septra (Sulfamethoxazole W-Trimethoprim) Hives    Medications Prior to Admission  Medication Sig Dispense Refill  . ALPRAZolam (XANAX) 0.5 MG tablet Take 0.5 mg by mouth 3 (three) times daily as needed. For anxiety      . aspirin 81 MG tablet Take 81 mg by mouth 2 (two) times daily.       . calcium-vitamin D (OSCAL WITH D) 500-200 MG-UNIT per tablet Take 1 tablet by mouth 2 (two) times daily.       . CYMBALTA 60 MG capsule Take 60 mg by mouth daily.       . fish oil-omega-3 fatty acids 1000 MG capsule Take 1 g by mouth 2 (two) times daily.       . Glucosamine Sulfate 750 MG TABS Take 750 mg by mouth 2 (two) times daily.      Alfonso Patten 2 MG TABS Take 2 mg by mouth  at bedtime.       . Magnesium 500 MG CAPS Take 500 mg by mouth daily.       . Misc Natural Products (TART CHERRY ADVANCED PO) Take 1 tablet by mouth 3 (three) times daily.       . Multiple Vitamins-Minerals (MULTIVITAMIN WITH MINERALS) tablet Take 1 tablet by mouth daily.      . niacin 500 MG tablet Take 500 mg by mouth 2 (two) times daily with a meal.      . RABEprazole (ACIPHEX) 20 MG tablet Take 20 mg by mouth daily.      . Vitamin D, Ergocalciferol, (DRISDOL) 50000 UNITS CAPS Take 50,000 Units by mouth every 14 (fourteen) days. On Sundays        No results found for this or any previous visit (from the past 48 hour(s)). No results found.  Review of Systems  Constitutional: Negative for fever and chills.  Gastrointestinal: Negative for nausea, vomiting and diarrhea.    Blood pressure 101/68, pulse 67, temperature 97.9 F (36.6 C), temperature source Oral,  resp. rate 18, SpO2 96.00%. Physical Exam  Constitutional: She appears well-developed and well-nourished. No distress.  Eyes: No scleral icterus.  Neck: Neck supple.  Cardiovascular: Normal rate and regular rhythm.   Respiratory: Effort normal and breath sounds normal.       Left chest wall scar.  GI: Soft. There is no tenderness.  Musculoskeletal: She exhibits no edema.  Lymphadenopathy:    She has no cervical adenopathy.  Skin: Skin is warm and dry.     Assessment/Plan Symptomatic cholelithiasis  Plan:  Laparoscopic cholecystectomy.  Procedure and risks discussed with her preop.  Jesson Foskey J 11/11/2011, 7:29 AM

## 2011-11-11 NOTE — Interval H&P Note (Signed)
History and Physical Interval Note:  11/11/2011 7:33 AM  Tammy Eaton  has presented today for surgery, with the diagnosis of gallstones  The various methods of treatment have been discussed with the patient and family. After consideration of risks, benefits and other options for treatment, the patient has consented to  Procedure(s) (LRB) with comments: LAPAROSCOPIC CHOLECYSTECTOMY WITH INTRAOPERATIVE CHOLANGIOGRAM (N/A) as a surgical intervention .  The patient's history has been reviewed, patient examined, no change in status, stable for surgery.  I have reviewed the patient's chart and labs.  Questions were answered to the patient's satisfaction.     Kumari Sculley Shela Commons

## 2011-11-11 NOTE — Transfer of Care (Signed)
Immediate Anesthesia Transfer of Care Note  Patient: Tammy Eaton  Procedure(s) Performed: Procedure(s) (LRB) with comments: LAPAROSCOPIC CHOLECYSTECTOMY WITH INTRAOPERATIVE CHOLANGIOGRAM (N/A)  Patient Location: PACU  Anesthesia Type: General  Level of Consciousness: sedated  Airway & Oxygen Therapy: Patient Spontanous Breathing and Patient connected to nasal cannula oxygen  Post-op Assessment: Report given to PACU RN and Post -op Vital signs reviewed and stable  Post vital signs: Reviewed and stable  Complications: No apparent anesthesia complications

## 2011-11-11 NOTE — Op Note (Signed)
Preoperative diagnosis:  Symptomatic cholelithiasis  Postoperative diagnosis:  Same  Procedure: Laparoscopic cholecystectomy with cholangiogram.  Surgeon: Avel Peace, M.D.  Asst.:  Darnell Level, M.D.  Anesthesia: Gen.  Indication:   This is a 61 year old female with upper abdominal pain and multiple gallstones.  She presents for elective laparoscopic cholecystectomy.  Technique: She was brought to the operating room, placed supine on the operating table, and a general anesthetic was administered.  The abdominal wall was then sterilely prepped and draped. Local anesthetic (Marcaine) was infiltrated in the subumbilical region. A small subumbilical incision was made through the skin, subcutaneous tissue, fascia, and peritoneum entering the peritoneal cavity under direct vision. A pursestring suture of 0 Vicryl was placed around the edges of the fascia. A Hassan trocar was introduced into the peritoneal cavity and a pneumoperitoneum was created by insufflation of carbon dioxide gas. The laparoscope was introduced into the trocar and no underlying bleeding or organ injury was noted. The patient was then placed in the reverse Trendelenburg position with the right side tilted slightly up.  Three five millimeter trocars were then placed into the abdominal cavity under laparoscopic vision. One in the epigastric area, and 2 in the right upper quadrant area. The gallbladder was visualized and the fundus was grasped and retracted toward the right shoulder.  The infundibulum was mobilized with dissection close to the gallbladder and retracted laterally. The cystic duct was identified and a window was created around it. The cystic artery was also identified and a window was created around it. The critical view was achieved. A clip was placed at the neck of the gallbladder. A small incision was made in the cystic duct. A cholangiocatheter was introduced through the anterior abdominal wall and placed in the  cystic duct. A intraoperative cholangiogram was then performed.  Under real-time fluoroscopy, dilute contrast was injected into the cystic duct.  The common hepatic duct, the right and left hepatic ducts, and the common duct were all visualized. Contrast drained into the duodenum without obvious evidence of any obstructing ductal lesion. The final report is pending the Radiologist's interpretation.  The cholangiocatheter was removed, the cystic duct was clipped 3 times on the biliary side, and then the cystic duct was divided sharply. No bile leak was noted from the cystic duct stump.  The cystic artery was then clipped and divided. Following this the gallbladder was dissected free from the liver using electrocautery. There was some spillage of bile from a small puncture in the gallbladder but no stone spillage. The gallbladder was then placed in a retrieval bag and removed from the abdominal cavity through the subumbilical incision.  The gallbladder fossa was inspected, irrigated, and bleeding was controlled with electrocautery. Inspection showed that hemostasis was adequate and there was no evidence of bile leak.  The irrigation fluid was evacuated as much as possible.  The subumbilical trocar was removed and the fascial defect was closed by tightening and tying down the pursestring suture under laparoscopic vision.  The remaining trochars were removed and the pneumoperitoneum was released. The skin incisions were closed with 4-0 Monocryl subcuticular stitches. Steri-Strips and sterile dressings were applied.  The procedure was well-tolerated without any apparent complications. The patient was taken to the recovery room in satisfactory condition.

## 2011-11-14 ENCOUNTER — Encounter (HOSPITAL_COMMUNITY): Payer: Self-pay | Admitting: General Surgery

## 2011-11-24 ENCOUNTER — Ambulatory Visit (HOSPITAL_COMMUNITY)
Admission: RE | Admit: 2011-11-24 | Discharge: 2011-11-24 | Disposition: A | Discharge status: Home / Self Care | Payer: 59 | Source: Ambulatory Visit | Attending: Oncology | Admitting: Oncology

## 2011-11-24 ENCOUNTER — Encounter (HOSPITAL_COMMUNITY): Payer: Self-pay

## 2011-11-24 DIAGNOSIS — C3410 Malignant neoplasm of upper lobe, unspecified bronchus or lung: Secondary | ICD-10-CM | POA: Insufficient documentation

## 2011-11-24 DIAGNOSIS — J4489 Other specified chronic obstructive pulmonary disease: Secondary | ICD-10-CM | POA: Insufficient documentation

## 2011-11-24 DIAGNOSIS — J449 Chronic obstructive pulmonary disease, unspecified: Secondary | ICD-10-CM | POA: Insufficient documentation

## 2011-11-24 DIAGNOSIS — I7090 Unspecified atherosclerosis: Secondary | ICD-10-CM | POA: Insufficient documentation

## 2011-11-24 DIAGNOSIS — K7689 Other specified diseases of liver: Secondary | ICD-10-CM | POA: Insufficient documentation

## 2011-11-24 DIAGNOSIS — Z902 Acquired absence of lung [part of]: Secondary | ICD-10-CM | POA: Insufficient documentation

## 2011-11-24 MED ORDER — IOHEXOL 300 MG/ML  SOLN
80.0000 mL | Freq: Once | INTRAMUSCULAR | Status: AC | PRN
  Administered 2011-11-24: 80 mL via INTRAVENOUS

## 2011-11-27 ENCOUNTER — Telehealth: Payer: Self-pay | Admitting: Oncology

## 2011-11-27 ENCOUNTER — Other Ambulatory Visit (HOSPITAL_BASED_OUTPATIENT_CLINIC_OR_DEPARTMENT_OTHER): Payer: 59 | Admitting: Lab

## 2011-11-27 ENCOUNTER — Ambulatory Visit (HOSPITAL_BASED_OUTPATIENT_CLINIC_OR_DEPARTMENT_OTHER): Payer: 59 | Admitting: Oncology

## 2011-11-27 ENCOUNTER — Encounter: Payer: Self-pay | Admitting: Oncology

## 2011-11-27 VITALS — BP 96/59 | HR 82 | Temp 98.0°F | Resp 20 | Ht 66.0 in | Wt 128.4 lb

## 2011-11-27 DIAGNOSIS — Z85118 Personal history of other malignant neoplasm of bronchus and lung: Secondary | ICD-10-CM

## 2011-11-27 DIAGNOSIS — C3412 Malignant neoplasm of upper lobe, left bronchus or lung: Secondary | ICD-10-CM | POA: Insufficient documentation

## 2011-11-27 DIAGNOSIS — C3410 Malignant neoplasm of upper lobe, unspecified bronchus or lung: Secondary | ICD-10-CM

## 2011-11-27 LAB — CBC WITH DIFFERENTIAL/PLATELET
BASO%: 0.7 % (ref 0.0–2.0)
Eosinophils Absolute: 0.5 10*3/uL (ref 0.0–0.5)
LYMPH%: 20.3 % (ref 14.0–49.7)
MCHC: 33.5 g/dL (ref 31.5–36.0)
MONO#: 0.8 10*3/uL (ref 0.1–0.9)
NEUT#: 6.1 10*3/uL (ref 1.5–6.5)
Platelets: 280 10*3/uL (ref 145–400)
RBC: 4.89 10*6/uL (ref 3.70–5.45)
RDW: 13.8 % (ref 11.2–14.5)
WBC: 9.2 10*3/uL (ref 3.9–10.3)
lymph#: 1.9 10*3/uL (ref 0.9–3.3)

## 2011-11-27 LAB — COMPREHENSIVE METABOLIC PANEL (CC13)
ALT: 23 U/L (ref 0–55)
Albumin: 3.9 g/dL (ref 3.5–5.0)
CO2: 30 mEq/L — ABNORMAL HIGH (ref 22–29)
Chloride: 104 mEq/L (ref 98–107)
Glucose: 72 mg/dl (ref 70–99)
Potassium: 4 mEq/L (ref 3.5–5.1)
Sodium: 143 mEq/L (ref 136–145)
Total Protein: 6.5 g/dL (ref 6.4–8.3)

## 2011-11-27 LAB — LACTATE DEHYDROGENASE (CC13): LDH: 175 U/L (ref 125–220)

## 2011-11-27 NOTE — Telephone Encounter (Signed)
appts made and printed for pt aom °

## 2011-11-27 NOTE — Progress Notes (Signed)
This office note has been dictated.  #[id number]

## 2011-11-28 NOTE — Progress Notes (Signed)
CC:   Duncan Dull, M.D. Laqueta Linden, M.D. Jamas Lav, M.D.  PROBLEM LIST: 1. 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 Edwyna Shell.  Tumor stage was T1 N0 M0, IA, with all 8 lymph     nodes negative.  The patient remains disease free. 2. Stable left lower lobe nodule measuring 4 mm, dating back to 2008. 3. COPD. 4. GERD. 5. History of sinusitis. 6. Degenerative arthritis. 7. History of depression. 8. Cholelithiasis status post laparoscopic cholecystectomy by Dr. Avel Peace on 11/11/2011.  MEDICATIONS: 1. Xanax 0.5 mg 3 times daily as needed. 2. Os-Cal with vitamin D 500/200 mg 1 tablet twice daily. 3. Cymbalta 60 mg daily. 4. Fish oil/omega-3 fatty acids 1000 mg twice daily. 5. Glucosamine sulfate 750 mg twice daily. 6. Lunesta 2 mg at bedtime. 7. Magnesium 500 mg daily. 8. Miscellaneous natural products 1 tablets 3 times daily. 9. Multivitamins with minerals 1 tablet daily. 10.AcipHex 20 mg daily. 11.Drisdol 50,000 units every 14 days.  Pneumovax given at age 47. Flu shot given in mid October 2013.   SMOKING HISTORY:  The patient smoked 2 packs of cigarettes a day for 40 years starting as a teenager.  She stopped smoking in February 2010.        HISTORY:  I saw Tammy Eaton today for followup of her stage IA 1.5 cm adenocarcinoma of the left upper lobe.  Tumor had bronchioloalveolar features.  The patient underwent a left upper lobectomy on May 22, 2003.  She continues to do well.  She was last seen by [place] on 11/29/2010. She had a CT scan of the chest with IV contrast on 11/24/2011.  There is no evidence for recurrent or new cancers.  The patient tells me that she was doing well up until 3 months ago when she started having nausea and pain in her epigastrium as well as left shoulder.  She underwent a cardiac workup which involved a nuclear  study that was normal.  An ultrasound of the abdomen carried out on 09/18/2011 showed greater than 10 gallstones.  The patient had an upper endoscopy by Dr. Charlott Rakes which apparently showed a gastric ulcer that was negative for H pylori.  Tammy Eaton underwent a laparoscopic cholecystectomy by Dr. Avel Peace on 11/11/2011.  She is still recovering from that surgery.  The patient's appetite is fair.  She still has some nausea intermittently.  Apparently her pain is gone.  She does have diffuse pain from arthritis.  She denies any respiratory problems.  PHYSICAL EXAMINATION:  She is somewhat thin.  Features are a little weathered for her age of 18.  Weight is 128.4 pounds, height 5 feet 6 inches, body surface area 1.65 sq m, blood pressure 96/59.  Other vital signs are normal.  O2 saturation on room air at rest was 96%.  There is no scleral icterus.  Mouth and pharynx are benign.  No peripheral adenopathy palpable.  Heart and lungs were normal.  The scar from her surgery in the left chest is without evidence for local recurrence. Abdomen is benign with no organomegaly or masses palpable.  She has some recent incisions that are healing.  No peripheral edema or clubbing. Neurologic exam is normal.  LABORATORY DATA:  Today, white count 9.2, ANC 6.1, hemoglobin 14.8, hematocrit 44.3, platelets 280,000.  Chemistries today were normal except  for a CO2 content of 30 with normal being 22-29.  IMAGING STUDIES: 1. Digital screening mammogram on 02/07/2011 was negative. 2. Chest CT scan with IV contrast on 02/14/2011 showed no evidence for     recurrent or metastatic disease.  There was extensive centrilobular     emphysematous changes. 3. Ultrasound of the abdomen complete on 09/18/2011 showed     cholelithiasis with no evidence for associated cholecystitis,     otherwise negative.  There were greater than 10 mobile gallstones     identified.  The largest measurable stone had a length of  8 mm. 4. Intraoperative cholangiogram on 11/11/2011 showed no bile duct     stones seen.  There was normal appearance of the intra and     extrahepatic biliary tree. 5. CT scan of the chest with IV contrast on 11/24/2011 showed     calcified right upper lobe granuloma and a left lower anterior 4 mm     lung nodule and left lateral basilar scarring, all unchanged from     2008 and considered benign.  There was also atherosclerosis and     stable benign appearing lesions in the liver.  At least 1 lesion     was a cyst and there was felt to be probable fatty infiltration.  IMPRESSION AND PLAN:  Tammy Eaton is now out 8-1/2 years from the time of diagnosis.  I told her that it was not necessary for her to see [place] again.  We have been seeing her on a yearly basis.  She would prefer to see [place] again.  She also would like to have periodic CT scans.  I will look into whether she qualifies for screening chest CT scans.  My understanding was that the age group is 62-74 with patients who have a greater than 30 pack year history, and who are within the 15 year window after having stopped smoking.  Tammy Eaton does seem to fit into this criteria and may be eligible for screening CT scans without IV contrast.  I do not think that at this point she needs diagnostic chest CT scans.  At the very least she should have chest x-rays every year or 2.  Will plan to see Tammy Eaton again in 1 year at which time we will check CBC, chemistries, including an LDH.  A decision will be made about screening imaging studies.  Tammy Eaton can see Norina Buzzard a year from now.    ______________________________ Samul Dada, M.D. DSM/MEDQ  D:  11/27/2011  T:  11/28/2011  Job:  [id number]

## 2011-12-02 ENCOUNTER — Encounter (INDEPENDENT_AMBULATORY_CARE_PROVIDER_SITE_OTHER): Payer: Self-pay | Admitting: Surgery

## 2011-12-02 ENCOUNTER — Ambulatory Visit (INDEPENDENT_AMBULATORY_CARE_PROVIDER_SITE_OTHER): Payer: 59 | Admitting: Surgery

## 2011-12-02 VITALS — BP 92/64 | HR 83 | Temp 98.2°F | Ht 66.5 in | Wt 128.4 lb

## 2011-12-02 DIAGNOSIS — Z9889 Other specified postprocedural states: Secondary | ICD-10-CM

## 2011-12-02 NOTE — Progress Notes (Signed)
[name]       DOB: Sep 30, 1950           DATE: 12/02/2011       WUJ:[id number]   CC: Postop laparoscopic cholecystectomy  Impression:  The patient appears to be doing well, with improvement in her symptoms.  Plan:  She may resume full activity and regular diet. She  will followup with [place] on a p.r.n. basis. I did tell her that she may still have some foods that cause indigestion and ask her to call us if there are any questions, problems or concerns.  HPI:  This patient underwent a laparoscopic cholecystectomy and operative cholangiogram on 11/11/2011. She is in for her first postoperative visit. She notes that her incisional pain has resolved. Her preoperative symptoms have improved. She is not having problems with nausea, vomiting, diarrhea, fevers, chills, or urinary symptoms. She is tolerating diet. She feels that she is progressing well and nearly back to normal. She was found to have a healing ulcer on endoscopy and she has stopped ASA and niacin with improvement of abdominal pain. PE:  VS: BP 92/64  Pulse 83  Temp 98.2 F (36.8 C) (Temporal)  Ht 5' 6.5" (1.689 m)  Wt 128 lb 6.4 oz (58.242 kg)  BMI 20.41 kg/m2  SpO2 94%  General: The patient is alert and appears comfortable, NAD.  Abdomen: Soft and benign. The incisions are healing nicely. There are no apparent problems.  Data reviewed: IOC:  normal Pathology:  gallstones

## 2011-12-02 NOTE — Patient Instructions (Signed)
Return as needed

## 2011-12-03 ENCOUNTER — Encounter: Payer: Self-pay | Admitting: Oncology

## 2011-12-03 NOTE — Progress Notes (Signed)
I made inquiries regarding low-dose chest CT scanning for the screening of lung cancer. Recommendations are for yearly scans in patients ages 68-74 with a 30 pack year smoking history who stopped smoking within the past 15 years.  Presently, most insurance companies apparently are not paying for the scans which cost approximately $250. I believe the patients are paying for these scans themselves. Scans need to be ordered by a physician or other provider.  When we see Tammy Eaton a year from now, we can decide whether we will continue to order lung scans for her and by which technique.

## 2012-01-13 ENCOUNTER — Other Ambulatory Visit: Payer: Self-pay | Admitting: Obstetrics & Gynecology

## 2012-01-13 DIAGNOSIS — Z1231 Encounter for screening mammogram for malignant neoplasm of breast: Secondary | ICD-10-CM

## 2012-02-19 ENCOUNTER — Ambulatory Visit
Admission: RE | Admit: 2012-02-19 | Discharge: 2012-02-19 | Disposition: A | Discharge status: Home / Self Care | Payer: 59 | Source: Ambulatory Visit | Attending: Obstetrics & Gynecology | Admitting: Obstetrics & Gynecology

## 2012-02-19 DIAGNOSIS — Z1231 Encounter for screening mammogram for malignant neoplasm of breast: Secondary | ICD-10-CM

## 2012-11-25 ENCOUNTER — Telehealth: Payer: Self-pay | Admitting: Internal Medicine

## 2012-11-25 ENCOUNTER — Encounter (INDEPENDENT_AMBULATORY_CARE_PROVIDER_SITE_OTHER): Payer: Self-pay

## 2012-11-25 ENCOUNTER — Ambulatory Visit (HOSPITAL_BASED_OUTPATIENT_CLINIC_OR_DEPARTMENT_OTHER): Payer: 59 | Admitting: Internal Medicine

## 2012-11-25 ENCOUNTER — Other Ambulatory Visit (HOSPITAL_BASED_OUTPATIENT_CLINIC_OR_DEPARTMENT_OTHER): Payer: 59 | Admitting: Lab

## 2012-11-25 ENCOUNTER — Encounter: Payer: Self-pay | Admitting: Internal Medicine

## 2012-11-25 ENCOUNTER — Other Ambulatory Visit: Payer: Self-pay | Admitting: Internal Medicine

## 2012-11-25 DIAGNOSIS — Z85118 Personal history of other malignant neoplasm of bronchus and lung: Secondary | ICD-10-CM

## 2012-11-25 LAB — CBC WITH DIFFERENTIAL/PLATELET
BASO%: 0.9 % (ref 0.0–2.0)
EOS%: 2.1 % (ref 0.0–7.0)
LYMPH%: 35.1 % (ref 14.0–49.7)
MCH: 29.4 pg (ref 25.1–34.0)
MCHC: 33.5 g/dL (ref 31.5–36.0)
MONO#: 0.4 10*3/uL (ref 0.1–0.9)
NEUT%: 54.8 % (ref 38.4–76.8)
RBC: 4.98 10*6/uL (ref 3.70–5.45)
WBC: 5.1 10*3/uL (ref 3.9–10.3)
lymph#: 1.8 10*3/uL (ref 0.9–3.3)

## 2012-11-25 LAB — COMPREHENSIVE METABOLIC PANEL (CC13)
ALT: 24 U/L (ref 0–55)
AST: 20 U/L (ref 5–34)
Anion Gap: 10 mEq/L (ref 3–11)
CO2: 26 mEq/L (ref 22–29)
Chloride: 106 mEq/L (ref 98–109)
Creatinine: 0.8 mg/dL (ref 0.6–1.1)
Sodium: 141 mEq/L (ref 136–145)
Total Bilirubin: 0.88 mg/dL (ref 0.20–1.20)
Total Protein: 7.2 g/dL (ref 6.4–8.3)

## 2012-11-25 NOTE — Telephone Encounter (Signed)
gv pt appt schedule for October 2015. [facility] will call re ct appt due approx 12/06/12. Pt aware.

## 2012-11-25 NOTE — Progress Notes (Signed)
[facility] OFFICE PROGRESS NOTE  [name], MD [address] [place] [address]  DIAGNOSIS: Lung cancer, upper lobe, unspecified laterality  Chief Complaint  Patient presents with  . Lung cancer, upper lobe    CURRENT THERAPY: Observation.  INTERVAL HISTORY: Tammy Eaton 62 y.o. 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  She was las seen one year ago on 11/25/2012 by  Dr. Kimberlee Nearing.  She continues to do well.  Her CT scan of the chest with IV contrast on 11/24/2011 demonstrated no evidence of disease recurrence.Her mammogram is scheduled for November.  Her Pap is scheduled for January.  Her next colonoscopy is due in 2017.   She is a former smoker (2 packs per year for the last 45 years).  She quit in 2010.  She denies any weight lost or acute shortness of breath or fevers/chills.   MEDICAL HISTORY: Past Medical History  Diagnosis Date  . Arthritis   . COPD (chronic obstructive pulmonary disease)   . Emphysema of lung   . Hyperlipidemia   . PONV (postoperative nausea and vomiting)     pt "could not eat during hospital stay"  . lung ca dx'd 04/2004    INTERIM HISTORY: has Symptomatic cholelithiasis and Lung cancer, upper lobe on her problem list.    ALLERGIES:  is allergic to amoxicillin; septra; and oxycodone.  MEDICATIONS: has a current medication list which includes the following prescription(s): alprazolam, aspirin, calcium-vitamin d, cymbalta, fish oil-omega-3 fatty acids, glucosamine sulfate, lunesta, magnesium, misc natural products, multivitamin with minerals, niacin, rabeprazole, and vitamin d (ergocalciferol).  SURGICAL HISTORY:  Past Surgical History  Procedure Laterality Date  . Lung lobectomy    . Sinus surgery with instatrak    . Tubal ligation    . Breast surgery    . Dilation and curettage of uterus    . Eye surgery    . Vocal cord lateralization,  endoscopic approach w/ mlb      nodule removed  . Knee surgery      rt knee torn cartilage  . Cholecystectomy  11/11/2011    Procedure: LAPAROSCOPIC CHOLECYSTECTOMY WITH INTRAOPERATIVE CHOLANGIOGRAM;  Surgeon: Adolph Pollack, MD;  Location: [facility] OR;  Service: General;  Laterality: N/A;    REVIEW OF SYSTEMS:   Constitutional: Denies fevers, chills or abnormal weight loss Eyes: Denies blurriness of vision Ears, nose, mouth, throat, and face: Denies mucositis or sore throat Respiratory: Denies cough, dyspnea or wheezes Cardiovascular: Denies palpitation, chest discomfort or lower extremity swelling Gastrointestinal:  Denies nausea, heartburn or change in bowel habits Skin: Denies abnormal skin rashes Lymphatics: Denies new lymphadenopathy or easy bruising Neurological:Denies numbness, tingling or new weaknesses Behavioral/Psych: Mood is stable, no new changes  All other systems were reviewed with the patient and are negative.  PHYSICAL EXAMINATION: ECOG PERFORMANCE STATUS: 0 - Asymptomatic  Blood pressure 117/58, pulse 83, temperature 98.1 F (36.7 C), temperature source Oral, resp. rate 20, height 5' 6.5" (1.689 m), weight 135 lb 6.4 oz (61.417 kg).  GENERAL:alert, no distress and comfortable SKIN: skin color, texture, turgor are normal, no rashes or significant lesions;  Scar from her left chest surgery is without evidence of local occurrence.  EYES: normal, Conjunctiva are pink and non-injected, sclera clear OROPHARYNX:no exudate, no erythema and lips, buccal mucosa, and tongue normal  NECK: supple, thyroid normal size, non-tender, without nodularity LYMPH:  no palpable lymphadenopathy in  the cervical, axillary or supraclavicular LUNGS: clear to auscultation and percussion with normal breathing effort HEART: regular rate & rhythm and no murmurs and no lower extremity edema ABDOMEN:abdomen soft, non-tender and normal bowel sounds; No organomegaly.  Musculoskeletal:no cyanosis of  digits and no clubbing  NEURO: alert & oriented x 3 with fluent speech, no focal motor/sensory deficits  LABORATORY DATA: Results for orders placed in visit on 11/25/12 (from the past 48 hour(s))  CBC WITH DIFFERENTIAL     Status: None   Collection Time    11/25/12  1:32 PM      Result Value Range   WBC 5.1  3.9 - 10.3 10e3/uL   NEUT# 2.8  1.5 - 6.5 10e3/uL   HGB 14.6  11.6 - 15.9 g/dL   HCT 57.8  46.9 - 62.9 %   Platelets 275  145 - 400 10e3/uL   MCV 87.8  79.5 - 101.0 fL   MCH 29.4  25.1 - 34.0 pg   MCHC 33.5  31.5 - 36.0 g/dL   RBC 5.28  4.13 - 2.44 10e6/uL   RDW 13.1  11.2 - 14.5 %   lymph# 1.8  0.9 - 3.3 10e3/uL   MONO# 0.4  0.1 - 0.9 10e3/uL   Eosinophils Absolute 0.1  0.0 - 0.5 10e3/uL   Basophils Absolute 0.0  0.0 - 0.1 10e3/uL   NEUT% 54.8  38.4 - 76.8 %   LYMPH% 35.1  14.0 - 49.7 %   MONO% 7.1  0.0 - 14.0 %   EOS% 2.1  0.0 - 7.0 %   BASO% 0.9  0.0 - 2.0 %  COMPREHENSIVE METABOLIC PANEL (CC13)     Status: None   Collection Time    11/25/12  1:32 PM      Result Value Range   Sodium 141  136 - 145 mEq/L   Potassium 4.2  3.5 - 5.1 mEq/L   Chloride 106  98 - 109 mEq/L   CO2 26  22 - 29 mEq/L   Glucose 99  70 - 140 mg/dl   BUN 8.0  7.0 - 01.0 mg/dL   Creatinine 0.8  0.6 - 1.1 mg/dL   Total Bilirubin 2.72  0.20 - 1.20 mg/dL   Alkaline Phosphatase 72  40 - 150 U/L   AST 20  5 - 34 U/L   ALT 24  0 - 55 U/L   Total Protein 7.2  6.4 - 8.3 g/dL   Albumin 3.9  3.5 - 5.0 g/dL   Calcium 9.6  8.4 - 53.6 mg/dL   Anion Gap 10  3 - 11 mEq/L   Labs:  Lab Results  Component Value Date   WBC 5.1 11/25/2012   HGB 14.6 11/25/2012   HCT 43.7 11/25/2012   MCV 87.8 11/25/2012   PLT 275 11/25/2012   NEUTROABS 2.8 11/25/2012      Chemistry      Component Value Date/Time   NA 141 11/25/2012 1332   NA 142 10/31/2011 1000   K 4.2 11/25/2012 1332   K 4.4 10/31/2011 1000   CL 104 11/27/2011 1349   CL 103 10/31/2011 1000   CO2 26 11/25/2012 1332   CO2 29 10/31/2011 1000   BUN  8.0 11/25/2012 1332   BUN 11 10/31/2011 1000   CREATININE 0.8 11/25/2012 1332   CREATININE 0.54 10/31/2011 1000      Component Value Date/Time   CALCIUM 9.6 11/25/2012 1332   CALCIUM 9.6 10/31/2011 1000   ALKPHOS 72 11/25/2012 1332  ALKPHOS 63 10/31/2011 1000   AST 20 11/25/2012 1332   AST 17 10/31/2011 1000   ALT 24 11/25/2012 1332   ALT 13 10/31/2011 1000   BILITOT 0.88 11/25/2012 1332   BILITOT 0.5 10/31/2011 1000     Basic Metabolic Panel:  Recent Labs Lab 11/25/12 1332  NA 141  K 4.2  CO2 26  GLUCOSE 99  BUN 8.0  CREATININE 0.8  CALCIUM 9.6   GFR Estimated Creatinine Clearance: 69.6 ml/min (by C-G formula based on Cr of 0.8). Liver Function Tests:  Recent Labs Lab 11/25/12 1332  AST 20  ALT 24  ALKPHOS 72  BILITOT 0.88  PROT 7.2  ALBUMIN 3.9   CBC:  Recent Labs Lab 11/25/12 1332  WBC 5.1  NEUTROABS 2.8  HGB 14.6  HCT 43.7  MCV 87.8  PLT 275   Studies:  No results found.   RADIOGRAPHIC STUDIES: No results found.  ASSESSMENT: Tammy Eaton 62 y.o. female with a history of Lung cancer, upper lobe, unspecified laterality   PLAN:  1. Lung cancer, upper lobe (Stage IA) s/p lobectomy in 2005.  --Jenica is now out 9-1/2 years from the time of  diagnosis.  We again advised her that it was not necessary for her to see [place] again but she would prefer to see [place] again in one year. Based on current screening guidelines, she would qualify for an annual low contrast surveillance CT of her chest.   She is within the age group is 23-74 with patients who have a greater than 30 pack year history, and who are within the 15 year window  after having stopped smoking. The risk of further contrast CT of chest including second malignancies outweigh potential benefits.  We will obtain a low contrast CT of Chest in November.    2, Follow-up.  Will plan to see Aislyn again in 1 year at which time we will check CBC, chemistries, including an LDH.   All questions were  answered. The patient knows to call the clinic with any problems, questions or concerns. We can certainly see the patient much sooner if necessary.  I spent 10 minutes counseling the patient face to face. The total time spent in the appointment was 15 minutes.    Breck Maryland, MD 11/25/2012 2:24 PM

## 2012-12-08 ENCOUNTER — Ambulatory Visit (HOSPITAL_COMMUNITY)
Admission: RE | Admit: 2012-12-08 | Discharge: 2012-12-08 | Disposition: A | Discharge status: Home / Self Care | Payer: 59 | Source: Ambulatory Visit | Attending: Internal Medicine | Admitting: Internal Medicine

## 2012-12-08 DIAGNOSIS — J438 Other emphysema: Secondary | ICD-10-CM | POA: Insufficient documentation

## 2012-12-08 DIAGNOSIS — I2510 Atherosclerotic heart disease of native coronary artery without angina pectoris: Secondary | ICD-10-CM | POA: Insufficient documentation

## 2012-12-08 DIAGNOSIS — C3490 Malignant neoplasm of unspecified part of unspecified bronchus or lung: Secondary | ICD-10-CM | POA: Insufficient documentation

## 2012-12-20 ENCOUNTER — Telehealth: Payer: Self-pay | Admitting: Internal Medicine

## 2012-12-20 NOTE — Telephone Encounter (Signed)
CT of chest without evidence of recurrence or metastatic disease.  She is aware.

## 2013-01-31 ENCOUNTER — Other Ambulatory Visit: Payer: Self-pay

## 2013-01-31 DIAGNOSIS — Z1231 Encounter for screening mammogram for malignant neoplasm of breast: Secondary | ICD-10-CM

## 2013-02-03 HISTORY — PX: BREAST EXCISIONAL BIOPSY: SUR124

## 2013-02-17 ENCOUNTER — Ambulatory Visit (INDEPENDENT_AMBULATORY_CARE_PROVIDER_SITE_OTHER): Payer: 59 | Admitting: Nurse Practitioner

## 2013-02-17 ENCOUNTER — Encounter: Payer: Self-pay | Admitting: *Deleted

## 2013-02-17 VITALS — BP 94/62 | HR 52 | Ht 65.5 in | Wt 136.0 lb

## 2013-02-17 DIAGNOSIS — E559 Vitamin D deficiency, unspecified: Secondary | ICD-10-CM

## 2013-02-17 DIAGNOSIS — Z01419 Encounter for gynecological examination (general) (routine) without abnormal findings: Secondary | ICD-10-CM

## 2013-02-17 MED ORDER — VITAMIN D (ERGOCALCIFEROL) 1.25 MG (50000 UNIT) PO CAPS: 50000.0000 [IU] | ORAL_CAPSULE | ORAL | Status: DC

## 2013-02-17 NOTE — Progress Notes (Signed)
Patient ID: Tammy Eaton, female   DOB: 22-Apr-1950, 63 y.o.   MRN: [id number] 63 y.o. G2P2002 Married Caucasian Fe here for annual exam.  Last chest CT scan 12/2012 and was normal.  She and husband are caring for 99 month old grand daughter during the week.  Patient's last menstrual period was 11/03/2005.          Sexually active: no  The current method of family planning is none.    Exercising: no  The patient does not participate in regular exercise at present. Smoker:  no  Health Maintenance: Pap:  02/16/12, WNL, neg HR HPV MMG:  02/19/12, Bi-Rads 2: benign findings Colonoscopy:  2007, repeat 10 years BMD:  2012 TDaP:  02/2011 Pneumovax: 2003 Shingles 2013 ? Labs: PCP   reports that she quit smoking about 4 years ago. She does not have any smokeless tobacco history on file. She reports that she drinks alcohol. She reports that she does not use illicit drugs.  Past Medical History  Diagnosis Date  . Arthritis   . COPD (chronic obstructive pulmonary disease)   . Emphysema of lung   . Hyperlipidemia   . PONV (postoperative nausea and vomiting)     pt "could not eat during hospital stay"  . lung ca 05/2003    Past Surgical History  Procedure Laterality Date  . Lung lobectomy Left 4/05  . Sinus surgery with instatrak    . Tubal ligation    . Breast surgery    . Dilation and curettage of uterus    . Eye surgery    . Vocal cord lateralization, endoscopic approach w/ mlb      nodule removed  . Knee surgery      rt knee torn cartilage  . Cholecystectomy  11/11/2011    Procedure: LAPAROSCOPIC CHOLECYSTECTOMY WITH INTRAOPERATIVE CHOLANGIOGRAM;  Surgeon: Odis Hollingshead, MD;  Location: [facility];  Service: General;  Laterality: N/A;  . Cystoscopy  12/10    for evaluation of hematuria    Current Outpatient Prescriptions  Medication Sig Dispense Refill  . ALPRAZolam (XANAX) 0.5 MG tablet Take 0.5 mg by mouth 3 (three) times daily as needed. For anxiety      . azelastine (ASTELIN) 137  MCG/SPRAY nasal spray       . CYMBALTA 60 MG capsule Take 60 mg by mouth daily.       . fish oil-omega-3 fatty acids 1000 MG capsule Take 1 g by mouth 3 (three) times daily.       . Fluticasone Furoate-Vilanterol (BREO ELLIPTA) 100-25 MCG/INH AEPB Inhale 1 puff into the lungs daily.      . Glucosamine Sulfate 750 MG TABS Take 750 mg by mouth 2 (two) times daily.      [name] 2 MG TABS Take 2 mg by mouth at bedtime.       . Magnesium 500 MG CAPS Take 500 mg by mouth daily.       . Misc Natural Products (TART CHERRY ADVANCED PO) Take 1 tablet by mouth 3 (three) times daily.       . Multiple Vitamins-Minerals (MULTIVITAMIN WITH MINERALS) tablet Take 1 tablet by mouth daily.      . Turmeric 500 MG CAPS Take 1 capsule by mouth daily.      . Vitamin D, Ergocalciferol, (DRISDOL) 50000 UNITS CAPS capsule Take 1 capsule (50,000 Units total) by mouth every 14 (fourteen) days. On Sundays  30 capsule  3   No current facility-administered medications for  this visit.    Family History  Problem Relation Age of Onset  . Cancer Mother 62    ovarian cancer  . Alcohol abuse Father     ROS:  Pertinent items are noted in HPI.  Otherwise, a comprehensive ROS was negative.  Exam:   BP 94/62  Pulse 52  Ht 5' 5.5" (1.664 m)  Wt 136 lb (61.689 kg)  BMI 22.28 kg/m2  LMP 11/03/2005 Height: 5' 5.5" (166.4 cm)  Ht Readings from Last 3 Encounters:  02/17/13 5' 5.5" (1.664 m)  11/25/12 5' 6.5" (1.689 m)  12/02/11 5' 6.5" (1.689 m)    General appearance: alert, cooperative and appears stated age Head: Normocephalic, without obvious abnormality, atraumatic Neck: no adenopathy, supple, symmetrical, trachea midline and thyroid normal to inspection and palpation Lungs: clear to auscultation bilaterally Breasts: normal appearance, no masses or tenderness Heart: regular rate and rhythm Abdomen: soft, non-tender; no masses,  no organomegaly Extremities: extremities normal, atraumatic, no cyanosis or  edema Skin: Skin color, texture, turgor normal. No rashes or lesions Lymph nodes: Cervical, supraclavicular, and axillary nodes normal. No abnormal inguinal nodes palpated Neurologic: Grossly normal   Pelvic: External genitalia:  no lesions              Urethra:  normal appearing urethra with no masses, tenderness or lesions              Bartholin's and Skene's: normal                 Vagina: normal appearing vagina with normal color and discharge, no lesions              Cervix: anteverted              Pap taken: no Bimanual Exam:  Uterus:  normal size, contour, position, consistency, mobility, non-tender              Adnexa: no mass, fullness, tenderness               Rectovaginal: Confirms               Anus:  normal sphincter tone, no lesions  A:  Well Woman with normal exam  Postmenopausal off HRT 1995 - 12/02  S/P LUL Lobectomy for Lung cancer stage I A- 05/2003, followed by Oncology  Vit D deficiency  P:   Pap smear as per guidelines   Mammogram due this month and is scheduled  Refill on Vit D and will follow with labs  Counseled on breast self exam, mammography screening, adequate intake of calcium and vitamin D, diet and exercise, Kegel's exercises return annually or prn  An After Visit Summary was printed and given to the patient.

## 2013-02-17 NOTE — Patient Instructions (Signed)

## 2013-02-18 LAB — VITAMIN D 25 HYDROXY (VIT D DEFICIENCY, FRACTURES): VIT D 25 HYDROXY: 61 ng/mL (ref 30–89)

## 2013-02-20 NOTE — Progress Notes (Signed)
Encounter reviewed by Dr. Brook Silva.  

## 2013-02-23 ENCOUNTER — Ambulatory Visit
Admission: RE | Admit: 2013-02-23 | Discharge: 2013-02-23 | Disposition: A | Discharge status: Home / Self Care | Payer: 59 | Source: Ambulatory Visit

## 2013-02-23 DIAGNOSIS — Z1231 Encounter for screening mammogram for malignant neoplasm of breast: Secondary | ICD-10-CM

## 2013-02-25 ENCOUNTER — Other Ambulatory Visit: Payer: Self-pay | Admitting: Obstetrics & Gynecology

## 2013-02-25 DIAGNOSIS — R928 Other abnormal and inconclusive findings on diagnostic imaging of breast: Secondary | ICD-10-CM

## 2013-02-28 NOTE — Progress Notes (Signed)
Quick Note:  Entered into mmg hold.   ______

## 2013-03-07 ENCOUNTER — Ambulatory Visit
Admission: RE | Admit: 2013-03-07 | Discharge: 2013-03-07 | Disposition: A | Discharge status: Home / Self Care | Payer: 59 | Source: Ambulatory Visit | Attending: Obstetrics & Gynecology | Admitting: Obstetrics & Gynecology

## 2013-03-07 ENCOUNTER — Other Ambulatory Visit: Payer: Self-pay | Admitting: Obstetrics & Gynecology

## 2013-03-07 DIAGNOSIS — N63 Unspecified lump in unspecified breast: Secondary | ICD-10-CM

## 2013-03-07 DIAGNOSIS — R928 Other abnormal and inconclusive findings on diagnostic imaging of breast: Secondary | ICD-10-CM

## 2013-03-08 ENCOUNTER — Ambulatory Visit
Admission: RE | Admit: 2013-03-08 | Discharge: 2013-03-08 | Disposition: A | Discharge status: Home / Self Care | Payer: 59 | Source: Ambulatory Visit | Attending: Obstetrics & Gynecology | Admitting: Obstetrics & Gynecology

## 2013-03-08 DIAGNOSIS — N63 Unspecified lump in unspecified breast: Secondary | ICD-10-CM

## 2013-03-23 ENCOUNTER — Encounter (INDEPENDENT_AMBULATORY_CARE_PROVIDER_SITE_OTHER): Payer: Self-pay | Admitting: General Surgery

## 2013-03-23 ENCOUNTER — Ambulatory Visit (INDEPENDENT_AMBULATORY_CARE_PROVIDER_SITE_OTHER): Payer: 59 | Admitting: General Surgery

## 2013-03-23 VITALS — BP 102/72 | HR 64 | Temp 98.2°F | Resp 14 | Ht 65.5 in | Wt 135.6 lb

## 2013-03-23 DIAGNOSIS — D249 Benign neoplasm of unspecified breast: Secondary | ICD-10-CM

## 2013-03-23 DIAGNOSIS — D241 Benign neoplasm of right breast: Secondary | ICD-10-CM | POA: Insufficient documentation

## 2013-03-23 NOTE — Progress Notes (Signed)
Patient ID: Tammy Eaton, female   DOB: [birth year], 63 y.o.   MRN: [id number]  Chief Complaint  Patient presents with  . Breast Mass    new pt- eval breast papilloma    HPI Tammy Eaton is a 63 y.o. female.   HPI  She is referred by Dr. Luberta Robertson for further evaluation and treatment of a right breast papilloma. She underwent an annual mammogram and was noted to have a mass in the right breast at the 9:00 position. An image guided biopsy was performed demonstrating a papilloma. The mass appeared to be heterogeneous. She's been sent over here to discuss excision. There is no immediate family history of breast cancer. Her mother died from ovarian cancer at the age of 56. She's had 2 previous breast biopsies on the left side that have been benign. Age at menarche was 35. First child was born before the age of 63. Menopause was in her late 11s. She took hormone replacement treatment for 4 years. No breast pain. No nipple discharge.  Past Medical History  Diagnosis Date  . Arthritis   . COPD (chronic obstructive pulmonary disease)   . Emphysema of lung   . Hyperlipidemia   . PONV (postoperative nausea and vomiting)     pt "could not eat during hospital stay"  . lung ca 05/2003    Past Surgical History  Procedure Laterality Date  . Lung lobectomy Left 4/05  . Sinus surgery with instatrak    . Tubal ligation    . Dilation and curettage of uterus    . Eye surgery    . Vocal cord lateralization, endoscopic approach w/ mlb      nodule removed  . Knee surgery      rt knee torn cartilage  . Cholecystectomy  11/11/2011    Procedure: LAPAROSCOPIC CHOLECYSTECTOMY WITH INTRAOPERATIVE CHOLANGIOGRAM;  Surgeon: Odis Hollingshead, MD;  Location: [facility];  Service: General;  Laterality: N/A;  . Cystoscopy  12/10    for evaluation of hematuria  . Breast surgery      Family History  Problem Relation Age of Onset  . Cancer Mother 79    ovarian cancer  . Alcohol abuse Father     Social  History History  Substance Use Topics  . Smoking status: Former Smoker -- 2.00 packs/day for 40 years    Quit date: 09/21/2008  . Smokeless tobacco: Not on file  . Alcohol Use: Yes     Comment: SOCIALLY    Allergies  Allergen Reactions  . Adhesive [Tape]   . Amoxicillin     thrush  . Septra [Sulfamethoxazole-Trimethoprim] Hives  . Oxycodone Rash    Current Outpatient Prescriptions  Medication Sig Dispense Refill  . ALPRAZolam (XANAX) 0.5 MG tablet Take 0.5 mg by mouth 3 (three) times daily as needed. For anxiety      . azelastine (ASTELIN) 137 MCG/SPRAY nasal spray       . CYMBALTA 60 MG capsule Take 60 mg by mouth daily.       . fish oil-omega-3 fatty acids 1000 MG capsule Take 1 g by mouth 3 (three) times daily.       . Fluticasone Furoate-Vilanterol (BREO ELLIPTA) 100-25 MCG/INH AEPB Inhale 1 puff into the lungs daily.      . Glucosamine Sulfate 750 MG TABS Take 750 mg by mouth 2 (two) times daily.      [name] 2 MG TABS Take 2 mg by mouth at bedtime.       Marland Kitchen  Magnesium 500 MG CAPS Take 500 mg by mouth daily.       . Misc Natural Products (TART CHERRY ADVANCED PO) Take 1 tablet by mouth 3 (three) times daily.       . Multiple Vitamins-Minerals (MULTIVITAMIN WITH MINERALS) tablet Take 1 tablet by mouth daily.      . Turmeric 500 MG CAPS Take 1 capsule by mouth daily.      . Vitamin D, Ergocalciferol, (DRISDOL) 50000 UNITS CAPS capsule Take 1 capsule (50,000 Units total) by mouth every 14 (fourteen) days. On Sundays  30 capsule  3   No current facility-administered medications for this visit.    Review of Systems Review of Systems  Constitutional: Negative.   HENT: Negative.   Respiratory: Negative.   Cardiovascular: Negative.   Gastrointestinal: Negative.   Genitourinary: Negative.   Musculoskeletal: Positive for arthralgias.  Hematological: Negative.     Blood pressure 102/72, pulse 64, temperature 98.2 F (36.8 C), temperature source Oral, resp. rate 14, height  5' 5.5" (1.664 m), weight 135 lb 9.6 oz (61.508 kg), last menstrual period 11/03/2005.  Physical Exam Physical Exam  Constitutional: She appears well-developed and well-nourished. No distress.  HENT:  Head: Normocephalic and atraumatic.  Eyes: No scleral icterus.  Neck: Neck supple.  Cardiovascular: Normal rate and regular rhythm.   Pulmonary/Chest: Effort normal.  Distant breath sounds are clear.  Right breast demonstrates a small puncture wound laterally as well as a resolving rash medial to this. No palpable masses. Left breast demonstrates 2 well-healed scars and no palpable masses.  Musculoskeletal:  No axillary or supraclavicular adenopathy  Lymphadenopathy:    She has no cervical adenopathy.  Neurological: She is alert.  Skin: Skin is warm and dry.  Psychiatric: She has a normal mood and affect. Her behavior is normal.    Data Reviewed Breast images. Pathology report-this was discussed with the pathologist.  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     Plan    Excision of right breast papilloma after wire localization.  I have explained the procedure, risks, and aftercare to her.  Risks include but are not limited to bleeding, infection, wound problems, seroma formation, missing the lesion, anesthesia.  She seems to understand and agrees with the plan.        Goodwin Kamphaus J 03/23/2013, 12:18 PM

## 2013-03-23 NOTE — Patient Instructions (Signed)
[facility] °Office Phone Number [phone] ° °BREAST BIOPSY/ PARTIAL MASTECTOMY: POST OP INSTRUCTIONS ° °Always review your discharge instruction sheet given to you by the facility where your surgery was performed. ° °IF YOU HAVE DISABILITY OR FAMILY LEAVE FORMS, YOU MUST BRING THEM TO THE OFFICE FOR PROCESSING.  DO NOT GIVE THEM TO YOUR DOCTOR. ° °1. A prescription for pain medication may be given to you upon discharge.  Take your pain medication as prescribed, if needed.  If narcotic pain medicine is not needed, then you may take acetaminophen (Tylenol) or ibuprofen (Advil) as needed. °2. Take your usually prescribed medications unless otherwise directed °3. If you need a refill on your pain medication, please contact your pharmacy.  They will contact our office to request authorization.  Prescriptions will not be filled after 5pm or on week-ends. °4. You should eat very light the first 24 hours after surgery, such as soup, crackers, pudding, etc.  Resume your normal diet the day after surgery. °5. Most patients will experience some swelling and bruising in the breast.  Ice packs and a good support bra will help.  Swelling and bruising can take several days to resolve.  °6. It is common to experience some constipation if taking pain medication after surgery.  Increasing fluid intake and taking a stool softener will usually help or prevent this problem from occurring.  A mild laxative (Milk of Magnesia or Miralax) should be taken according to package directions if there are no bowel movements after 48 hours. °7. Unless discharge instructions indicate otherwise, you may remove your bandages 24-48 hours after surgery, and you may shower at that time.  You may have steri-strips (small skin tapes) in place directly over the incision.  These strips should be left on the skin for 7-10 days.  If your surgeon used skin glue on the incision, you may shower in 24 hours.  The glue will flake off over the  next 2-3 weeks.  Any sutures or staples will be removed at the office during your follow-up visit. °8. ACTIVITIES:  You may resume regular daily activities (gradually increasing) beginning the next day.  Wearing a good support bra or sports bra minimizes pain and swelling.  You may have sexual intercourse when it is comfortable. °a. You may drive when you no longer are taking prescription pain medication, you can comfortably wear a seatbelt, and you can safely maneuver your car and apply brakes. °b. RETURN TO WORK:  ______________________________________________________________________________________ °9. You should see your doctor in the office for a follow-up appointment approximately two weeks after your surgery.  Your doctor’s nurse will typically make your follow-up appointment when she calls you with your pathology report.  Expect your pathology report 2-3 business days after your surgery.  You may call to check if you do not hear from us after three days. °10. OTHER INSTRUCTIONS: _______________________________________________________________________________________________ _____________________________________________________________________________________________________________________________________ °_____________________________________________________________________________________________________________________________________ °_____________________________________________________________________________________________________________________________________ ° °WHEN TO CALL YOUR DOCTOR: °1. Fever over 101.0 °2. Nausea and/or vomiting. °3. Extreme swelling or bruising. °4. Continued bleeding from incision. °5. Increased pain, redness, or drainage from the incision. ° °The clinic staff is available to answer your questions during regular business hours.  Please don’t hesitate to call and ask to speak to one of the nurses for clinical concerns.  If you have a medical emergency, go to the nearest  emergency room or call 911.  A surgeon from [facility] is always on call at the hospital. ° °For further questions, please visit [email or web address]  °

## 2013-03-24 ENCOUNTER — Other Ambulatory Visit (INDEPENDENT_AMBULATORY_CARE_PROVIDER_SITE_OTHER): Payer: Self-pay | Admitting: General Surgery

## 2013-03-28 ENCOUNTER — Other Ambulatory Visit (INDEPENDENT_AMBULATORY_CARE_PROVIDER_SITE_OTHER): Payer: Self-pay | Admitting: General Surgery

## 2013-03-28 DIAGNOSIS — D241 Benign neoplasm of right breast: Secondary | ICD-10-CM

## 2013-04-05 ENCOUNTER — Encounter (HOSPITAL_BASED_OUTPATIENT_CLINIC_OR_DEPARTMENT_OTHER): Payer: Self-pay | Admitting: *Deleted

## 2013-04-05 NOTE — Progress Notes (Signed)
Pt will come in for [facility] labs-hx lung ca 2005-had chest ct 10/14 Doing well

## 2013-04-06 ENCOUNTER — Encounter (HOSPITAL_BASED_OUTPATIENT_CLINIC_OR_DEPARTMENT_OTHER)
Admission: RE | Admit: 2013-04-06 | Discharge: 2013-04-06 | Disposition: A | Discharge status: Home / Self Care | Payer: 59 | Source: Ambulatory Visit | Attending: General Surgery | Admitting: General Surgery

## 2013-04-06 DIAGNOSIS — Z01812 Encounter for preprocedural laboratory examination: Secondary | ICD-10-CM | POA: Insufficient documentation

## 2013-04-06 LAB — COMPREHENSIVE METABOLIC PANEL
ALK PHOS: 62 U/L (ref 39–117)
ALT: 19 U/L (ref 0–35)
AST: 23 U/L (ref 0–37)
Albumin: 3.8 g/dL (ref 3.5–5.2)
BUN: 12 mg/dL (ref 6–23)
CO2: 28 meq/L (ref 19–32)
Calcium: 9.2 mg/dL (ref 8.4–10.5)
Chloride: 103 mEq/L (ref 96–112)
Creatinine, Ser: 0.63 mg/dL (ref 0.50–1.10)
GFR calc non Af Amer: >90 mL/min (ref >90)
GLUCOSE: 114 mg/dL — AB (ref 70–99)
Potassium: 4.3 mEq/L (ref 3.7–5.3)
SODIUM: 144 meq/L (ref 137–147)
Total Bilirubin: 0.8 mg/dL (ref 0.3–1.2)
Total Protein: 6.9 g/dL (ref 6.0–8.3)

## 2013-04-06 LAB — CBC WITH DIFFERENTIAL/PLATELET
Basophils Absolute: 0 10*3/uL (ref 0.0–0.1)
Basophils Relative: 0 % (ref 0–1)
EOS ABS: 0.1 10*3/uL (ref 0.0–0.7)
Eosinophils Relative: 1 % (ref 0–5)
HCT: 42.2 % (ref 36.0–46.0)
Hemoglobin: 14.4 g/dL (ref 12.0–15.0)
LYMPHS ABS: 1.7 10*3/uL (ref 0.7–4.0)
LYMPHS PCT: 25 % (ref 12–46)
MCH: 30.1 pg (ref 26.0–34.0)
MCHC: 34.1 g/dL (ref 30.0–36.0)
MCV: 88.1 fL (ref 78.0–100.0)
Monocytes Absolute: 0.3 10*3/uL (ref 0.1–1.0)
Monocytes Relative: 5 % (ref 3–12)
Neutro Abs: 4.7 10*3/uL (ref 1.7–7.7)
Neutrophils Relative %: 69 % (ref 43–77)
PLATELETS: 258 10*3/uL (ref 150–400)
RBC: 4.79 MIL/uL (ref 3.87–5.11)
RDW: 13.3 % (ref 11.5–15.5)
WBC: 6.9 10*3/uL (ref 4.0–10.5)

## 2013-04-06 LAB — PROTIME-INR
INR: 0.99 (ref 0.00–1.49)
Prothrombin Time: 12.9 seconds (ref 11.6–15.2)

## 2013-04-06 MED ORDER — VANCOMYCIN HCL IN DEXTROSE 1-5 GM/200ML-% IV SOLN: 1000.0000 mg | INTRAVENOUS | Status: DC

## 2013-04-08 ENCOUNTER — Other Ambulatory Visit (INDEPENDENT_AMBULATORY_CARE_PROVIDER_SITE_OTHER): Payer: Self-pay | Admitting: General Surgery

## 2013-04-08 DIAGNOSIS — D241 Benign neoplasm of right breast: Secondary | ICD-10-CM

## 2013-04-11 ENCOUNTER — Encounter (HOSPITAL_BASED_OUTPATIENT_CLINIC_OR_DEPARTMENT_OTHER)
Admission: RE | Disposition: A | Discharge status: Home / Self Care | Payer: Self-pay | Source: Ambulatory Visit | Attending: General Surgery

## 2013-04-11 ENCOUNTER — Encounter (HOSPITAL_BASED_OUTPATIENT_CLINIC_OR_DEPARTMENT_OTHER): Payer: Self-pay | Admitting: Certified Registered"

## 2013-04-11 ENCOUNTER — Ambulatory Visit (HOSPITAL_BASED_OUTPATIENT_CLINIC_OR_DEPARTMENT_OTHER)
Admission: RE | Admit: 2013-04-11 | Discharge: 2013-04-11 | Disposition: A | Discharge status: Home / Self Care | Payer: 59 | Source: Ambulatory Visit | Attending: General Surgery | Admitting: General Surgery

## 2013-04-11 ENCOUNTER — Encounter (HOSPITAL_BASED_OUTPATIENT_CLINIC_OR_DEPARTMENT_OTHER): Payer: 59 | Admitting: Certified Registered"

## 2013-04-11 ENCOUNTER — Ambulatory Visit
Admission: RE | Admit: 2013-04-11 | Discharge: 2013-04-11 | Disposition: A | Discharge status: Home / Self Care | Payer: 59 | Source: Ambulatory Visit | Attending: General Surgery | Admitting: General Surgery

## 2013-04-11 ENCOUNTER — Ambulatory Visit (HOSPITAL_BASED_OUTPATIENT_CLINIC_OR_DEPARTMENT_OTHER): Payer: 59 | Admitting: Certified Registered"

## 2013-04-11 DIAGNOSIS — N6089 Other benign mammary dysplasias of unspecified breast: Secondary | ICD-10-CM

## 2013-04-11 DIAGNOSIS — D241 Benign neoplasm of right breast: Secondary | ICD-10-CM

## 2013-04-11 DIAGNOSIS — Z885 Allergy status to narcotic agent status: Secondary | ICD-10-CM | POA: Insufficient documentation

## 2013-04-11 DIAGNOSIS — Z880 Allergy status to penicillin: Secondary | ICD-10-CM | POA: Insufficient documentation

## 2013-04-11 DIAGNOSIS — D249 Benign neoplasm of unspecified breast: Secondary | ICD-10-CM

## 2013-04-11 DIAGNOSIS — Z882 Allergy status to sulfonamides status: Secondary | ICD-10-CM | POA: Insufficient documentation

## 2013-04-11 DIAGNOSIS — M129 Arthropathy, unspecified: Secondary | ICD-10-CM | POA: Insufficient documentation

## 2013-04-11 DIAGNOSIS — Z87891 Personal history of nicotine dependence: Secondary | ICD-10-CM | POA: Insufficient documentation

## 2013-04-11 DIAGNOSIS — J438 Other emphysema: Secondary | ICD-10-CM | POA: Insufficient documentation

## 2013-04-11 DIAGNOSIS — R920 Mammographic microcalcification found on diagnostic imaging of breast: Secondary | ICD-10-CM

## 2013-04-11 DIAGNOSIS — E785 Hyperlipidemia, unspecified: Secondary | ICD-10-CM | POA: Insufficient documentation

## 2013-04-11 DIAGNOSIS — Z85118 Personal history of other malignant neoplasm of bronchus and lung: Secondary | ICD-10-CM | POA: Insufficient documentation

## 2013-04-11 HISTORY — PX: BREAST LUMPECTOMY WITH NEEDLE LOCALIZATION: SHX5759

## 2013-04-11 HISTORY — DX: Depression, unspecified: F32.A

## 2013-04-11 HISTORY — DX: Shortness of breath: R06.02

## 2013-04-11 HISTORY — DX: Presence of spectacles and contact lenses: Z97.3

## 2013-04-11 HISTORY — DX: Major depressive disorder, single episode, unspecified: F32.9

## 2013-04-11 LAB — POCT HEMOGLOBIN-HEMACUE: Hemoglobin: 11.2 g/dL — ABNORMAL LOW (ref 12.0–15.0)

## 2013-04-11 SURGERY — BREAST LUMPECTOMY WITH NEEDLE LOCALIZATION
Anesthesia: General | Site: Breast | Laterality: Right

## 2013-04-11 MED ORDER — FENTANYL CITRATE 0.05 MG/ML IJ SOLN
INTRAMUSCULAR | Status: DC | PRN
  Administered 2013-04-11: 100 ug via INTRAVENOUS

## 2013-04-11 MED ORDER — OXYCODONE HCL 5 MG PO TABS: 5.0000 mg | ORAL_TABLET | Freq: Once | ORAL | Status: DC | PRN

## 2013-04-11 MED ORDER — DEXAMETHASONE SODIUM PHOSPHATE 4 MG/ML IJ SOLN
INTRAMUSCULAR | Status: DC | PRN
  Administered 2013-04-11: 10 mg via INTRAVENOUS

## 2013-04-11 MED ORDER — MIDAZOLAM HCL 2 MG/2ML IJ SOLN
INTRAMUSCULAR | Status: AC
  Filled 2013-04-11: qty 2

## 2013-04-11 MED ORDER — ONDANSETRON HCL 4 MG/2ML IJ SOLN: 4.0000 mg | Freq: Once | INTRAMUSCULAR | Status: DC | PRN

## 2013-04-11 MED ORDER — HYDROMORPHONE HCL PF 1 MG/ML IJ SOLN
0.2500 mg | INTRAMUSCULAR | Status: DC | PRN
  Administered 2013-04-11 (×2): 0.5 mg via INTRAVENOUS

## 2013-04-11 MED ORDER — VANCOMYCIN HCL IN DEXTROSE 500-5 MG/100ML-% IV SOLN
INTRAVENOUS | Status: AC
  Filled 2013-04-11: qty 200

## 2013-04-11 MED ORDER — LIDOCAINE HCL (CARDIAC) 20 MG/ML IV SOLN
INTRAVENOUS | Status: DC | PRN
  Administered 2013-04-11: 80 mg via INTRAVENOUS

## 2013-04-11 MED ORDER — FENTANYL CITRATE 0.05 MG/ML IJ SOLN
INTRAMUSCULAR | Status: AC
  Filled 2013-04-11: qty 6

## 2013-04-11 MED ORDER — HYDROCODONE-ACETAMINOPHEN 5-325 MG PO TABS: 1.0000 | ORAL_TABLET | ORAL | Status: DC | PRN

## 2013-04-11 MED ORDER — MIDAZOLAM HCL 5 MG/5ML IJ SOLN
INTRAMUSCULAR | Status: DC | PRN
Start: 2013-04-11 — End: 2013-04-11
  Administered 2013-04-11: 2 mg via INTRAVENOUS

## 2013-04-11 MED ORDER — LIDOCAINE HCL (PF) 1 % IJ SOLN
INTRAMUSCULAR | Status: AC
  Filled 2013-04-11: qty 30

## 2013-04-11 MED ORDER — HYDROMORPHONE HCL PF 1 MG/ML IJ SOLN
INTRAMUSCULAR | Status: AC
  Filled 2013-04-11: qty 1

## 2013-04-11 MED ORDER — FENTANYL CITRATE 0.05 MG/ML IJ SOLN: 50.0000 ug | INTRAMUSCULAR | Status: DC | PRN

## 2013-04-11 MED ORDER — HYDROCODONE-ACETAMINOPHEN 5-325 MG PO TABS
1.0000 | ORAL_TABLET | Freq: Once | ORAL | Status: AC
  Administered 2013-04-11: 1 via ORAL

## 2013-04-11 MED ORDER — HYDROCODONE-ACETAMINOPHEN 5-325 MG PO TABS
ORAL_TABLET | ORAL | Status: AC
  Filled 2013-04-11: qty 1

## 2013-04-11 MED ORDER — MIDAZOLAM HCL 2 MG/2ML IJ SOLN: 1.0000 mg | INTRAMUSCULAR | Status: DC | PRN

## 2013-04-11 MED ORDER — VANCOMYCIN HCL IN DEXTROSE 1-5 GM/200ML-% IV SOLN
1000.0000 mg | INTRAVENOUS | Status: AC
  Administered 2013-04-11: 1000 mg via INTRAVENOUS

## 2013-04-11 MED ORDER — LACTATED RINGERS IV SOLN
INTRAVENOUS | Status: DC
  Administered 2013-04-11 (×2): via INTRAVENOUS

## 2013-04-11 MED ORDER — BUPIVACAINE HCL (PF) 0.5 % IJ SOLN
INTRAMUSCULAR | Status: DC | PRN
  Administered 2013-04-11: 10 mL

## 2013-04-11 MED ORDER — PROPOFOL 10 MG/ML IV BOLUS
INTRAVENOUS | Status: DC | PRN
  Administered 2013-04-11: 140 mg via INTRAVENOUS

## 2013-04-11 MED ORDER — OXYCODONE HCL 5 MG/5ML PO SOLN: 5.0000 mg | Freq: Once | ORAL | Status: DC | PRN

## 2013-04-11 SURGICAL SUPPLY — 45 items
BENZOIN TINCTURE PRP APPL 2/3 (GAUZE/BANDAGES/DRESSINGS) IMPLANT
BINDER BREAST LRG (GAUZE/BANDAGES/DRESSINGS) ×2 IMPLANT
BINDER BREAST MEDIUM (GAUZE/BANDAGES/DRESSINGS) IMPLANT
BINDER BREAST XLRG (GAUZE/BANDAGES/DRESSINGS) ×2 IMPLANT
BINDER BREAST XXLRG (GAUZE/BANDAGES/DRESSINGS) IMPLANT
BLADE SURG 15 STRL LF DISP TIS (BLADE) ×1 IMPLANT
BLADE SURG 15 STRL SS (BLADE) ×1
CANISTER SUCT 1200ML W/VALVE (MISCELLANEOUS) IMPLANT
CHLORAPREP W/TINT 26ML (MISCELLANEOUS) ×2 IMPLANT
COVER MAYO STAND STRL (DRAPES) ×2 IMPLANT
COVER TABLE BACK 60X90 (DRAPES) ×2 IMPLANT
DECANTER SPIKE VIAL GLASS SM (MISCELLANEOUS) IMPLANT
DERMABOND ADVANCED (GAUZE/BANDAGES/DRESSINGS) ×1
DERMABOND ADVANCED .7 DNX12 (GAUZE/BANDAGES/DRESSINGS) ×1 IMPLANT
DEVICE DUBIN W/COMP PLATE 8390 (MISCELLANEOUS) ×2 IMPLANT
DRAPE PED LAPAROTOMY (DRAPES) ×2 IMPLANT
DRAPE UTILITY XL STRL (DRAPES) ×2 IMPLANT
DRSG TELFA 3X8 NADH (GAUZE/BANDAGES/DRESSINGS) ×2 IMPLANT
ELECT COATED BLADE 2.86 ST (ELECTRODE) ×2 IMPLANT
ELECT REM PT RETURN 9FT ADLT (ELECTROSURGICAL) ×2
ELECTRODE REM PT RTRN 9FT ADLT (ELECTROSURGICAL) ×1 IMPLANT
GLOVE BIO SURGEON STRL SZ 6.5 (GLOVE) ×2 IMPLANT
GLOVE BIOGEL PI IND STRL 7.0 (GLOVE) ×1 IMPLANT
GLOVE BIOGEL PI IND STRL 8.5 (GLOVE) ×1 IMPLANT
GLOVE BIOGEL PI INDICATOR 7.0 (GLOVE) ×1
GLOVE BIOGEL PI INDICATOR 8.5 (GLOVE) ×1
GLOVE ECLIPSE 8.0 STRL XLNG CF (GLOVE) ×2 IMPLANT
GOWN STRL REUS W/ TWL LRG LVL3 (GOWN DISPOSABLE) ×2 IMPLANT
GOWN STRL REUS W/TWL LRG LVL3 (GOWN DISPOSABLE) ×2
NEEDLE HYPO 25X1 1.5 SAFETY (NEEDLE) ×2 IMPLANT
NS IRRIG 1000ML POUR BTL (IV SOLUTION) ×2 IMPLANT
PACK BASIN DAY SURGERY FS (CUSTOM PROCEDURE TRAY) ×2 IMPLANT
PENCIL BUTTON HOLSTER BLD 10FT (ELECTRODE) ×2 IMPLANT
SLEEVE SCD COMPRESS KNEE MED (MISCELLANEOUS) ×2 IMPLANT
SPONGE GAUZE 4X4 12PLY (GAUZE/BANDAGES/DRESSINGS) IMPLANT
SPONGE GAUZE 4X4 12PLY STER LF (GAUZE/BANDAGES/DRESSINGS) IMPLANT
STRIP CLOSURE SKIN 1/2X4 (GAUZE/BANDAGES/DRESSINGS) IMPLANT
SUT MON AB 4-0 PC3 18 (SUTURE) ×2 IMPLANT
SUT SILK 2 0 FS (SUTURE) ×2 IMPLANT
SUT VICRYL 3-0 CR8 SH (SUTURE) ×2 IMPLANT
SYR CONTROL 10ML LL (SYRINGE) ×2 IMPLANT
TOWEL OR 17X24 6PK STRL BLUE (TOWEL DISPOSABLE) ×2 IMPLANT
TOWEL OR NON WOVEN STRL DISP B (DISPOSABLE) ×2 IMPLANT
TUBE CONNECTING 20X1/4 (TUBING) ×2 IMPLANT
YANKAUER SUCT BULB TIP NO VENT (SUCTIONS) ×2 IMPLANT

## 2013-04-11 NOTE — Op Note (Signed)
Operative Note  Tammy Eaton female 63 y.o. 04/11/2013  PREOPERATIVE DX:  Right breast papilloma  POSTOPERATIVE DX:  Same  PROCEDURE:  Right breast lumpectomy after wire localization.         Surgeon: Odis Hollingshead   Assistants: None  Anesthesia: General endotracheal anesthesia  Indications: This is a 63 year old female noted to have a suspicious lesion at the 9:00 position of the right breast. Image guided biopsy demonstrated a heterogeneous papilloma and it was recommendation there be a wider excision.  She presents now for that.    Procedure Detail:  She underwent successful wire localization at the breast imaging Center. She was seen in the holding area in the right breast was marked with my initials. She is brought to the operating room placed supine on the operating table and a general anesthetic was given. The dressing was removed from the breast and the wire was clipped closer to the skin. The right breast and wire were sterilely prepped and draped.  In the lateral aspect of the right breast a curvilinear incision is made through the skin and subcutaneous tissue and the wire was brought into the lesion. A lump of tissue centered excised around the wire using electrocautery. I tried to get good gross margins. Specimen mammogram demonstrated the area of concern to be contained in the middle of the specimen and this was confirmed by the radiologist. The specimen had been marked with a silk suture anteriorly and the wire was lateral.  Local anesthetic consisting of 0.5% Marcaine was injected into the wound. Bleeding was controlled with electrocautery. The wound was irrigated. Hemostasis was confirmed.  The subcutaneous tissue the wound was closed with interrupted 3-0 Vicryl sutures. The skin is close with 4-0 Monocryl subcuticular stitch. Dermabond and a nonstick dressing were applied. A breast binder was put on.  She tolerated the procedure without any apparent complications  and was taken to the recovery room in satisfactory condition.  Estimated Blood Loss:  less than 100 mL         Drains: none  Blood Given: none          Specimens: Right breast tissue        Complications:  * No complications entered in OR log *         Disposition: PACU - hemodynamically stable.         Condition: stable

## 2013-04-11 NOTE — Anesthesia Preprocedure Evaluation (Addendum)
Anesthesia Evaluation  Patient identified by MRN, date of birth, ID band Patient awake    Reviewed: Allergy & Precautions, H&P , NPO status , Patient's Chart, lab work & pertinent test results  History of Anesthesia Complications (+) PONV  Airway Mallampati: I TM Distance: >3 FB Neck ROM: Full    Dental  (+) Teeth Intact, Partial Lower, Partial Upper   Pulmonary COPDformer smoker,  breath sounds clear to auscultation        Cardiovascular Rhythm:Regular Rate:Normal     Neuro/Psych Depression    GI/Hepatic   Endo/Other    Renal/GU      Musculoskeletal   Abdominal   Peds  Hematology   Anesthesia Other Findings   Reproductive/Obstetrics                          Anesthesia Physical Anesthesia Plan  ASA: II  Anesthesia Plan: General   Post-op Pain Management:    Induction: Intravenous  Airway Management Planned: LMA  Additional Equipment:   Intra-op Plan:   Post-operative Plan: Extubation in OR  Informed Consent: I have reviewed the patients History and Physical, chart, labs and discussed the procedure including the risks, benefits and alternatives for the proposed anesthesia with the patient or authorized representative who has indicated his/her understanding and acceptance.   Dental advisory given  Plan Discussed with: CRNA and Surgeon  Anesthesia Plan Comments:        Anesthesia Quick Evaluation

## 2013-04-11 NOTE — Interval H&P Note (Signed)
History and Physical Interval Note:  04/11/2013 11:36 AM  Tammy Eaton  has presented today for surgery, with the diagnosis of breast mass  The various methods of treatment have been discussed with the patient and family. After consideration of risks, benefits and other options for treatment, the patient has consented to  Procedure(s): BREAST LUMPECTOMY WITH NEEDLE LOCALIZATION (Right) as a surgical intervention .  The patient's history has been reviewed, patient examined, no change in status, stable for surgery.  I have reviewed the patient's chart and labs.  Questions were answered to the patient's satisfaction.     Rayshaun Needle Lenna Sciara

## 2013-04-11 NOTE — Discharge Instructions (Addendum)
[place] Office Phone Number [phone]  BREAST BIOPSY/ PARTIAL MASTECTOMY: POST OP INSTRUCTIONS  Always review your discharge instruction sheet given to you by the facility where your surgery was performed.  IF YOU HAVE DISABILITY OR FAMILY LEAVE FORMS, YOU MUST BRING THEM TO THE OFFICE FOR PROCESSING.  DO NOT GIVE THEM TO YOUR DOCTOR.  1. A prescription for pain medication may be given to you upon discharge.  Take your pain medication as prescribed, if needed.  If narcotic pain medicine is not needed, then you may take acetaminophen (Tylenol) or ibuprofen (Advil) as needed. 2. Take your usually prescribed medications unless otherwise directed 3. If you need a refill on your pain medication, please contact your pharmacy.  They will contact our office to request authorization.  Prescriptions will not be filled after 5pm or on week-ends. 4. You should eat very light the first 24 hours after surgery, such as soup, crackers, pudding, etc.  Resume your normal diet the day after surgery. 5. Most patients will experience some swelling and bruising in the breast.  Ice packs and a good support bra will help.  Swelling and bruising can take several days to resolve.  6. It is common to experience some constipation if taking pain medication after surgery.  Increasing fluid intake and taking a stool softener will usually help or prevent this problem from occurring.  A mild laxative (Milk of Magnesia or Miralax) should be taken according to package directions if there are no bowel movements after 48 hours. 7. Unless discharge instructions indicate otherwise, you may remove your breast binder and bandages 48 hours after surgery, and you may shower at that time.  You may have steri-strips (small skin tapes) in place directly over the incision.  These strips should be left on the skin for 7-10 days.  If your surgeon used skin glue on the incision, you may shower in 24 hours.  The glue will flake  off over the next 2-3 weeks.  Any sutures or staples will be removed at the office during your follow-up visit. 8. ACTIVITIES:  You may resume regular daily activities (gradually increasing) beginning the next day.  Wearing a good support bra or sports bra minimizes pain and swelling.  You may have sexual intercourse when it is comfortable. a. You may drive when you no longer are taking prescription pain medication, you can comfortably wear a seatbelt, and you can safely maneuver your car and apply brakes. b. RETURN TO WORK:  __2-3 days or when comfortable.____________________________________________________________________________________ 9. You should see your doctor in the office for a follow-up appointment approximately two weeks after your surgery.  Your doctors nurse will typically make your follow-up appointment when she calls you with your pathology report.  Expect your pathology report 3 business days after your surgery.  You may call to check if you do not hear from [place] after three days. 10. OTHER INSTRUCTIONS: _______________________________________________________________________________________________ _____________________________________________________________________________________________________________________________________ _____________________________________________________________________________________________________________________________________ _____________________________________________________________________________________________________________________________________  WHEN TO CALL YOUR DOCTOR: 1. Fever over 101.0 2. Nausea and/or vomiting. 3. Extreme swelling or bruising. 4. Continued bleeding from incision. 5. Increased pain, redness, or drainage from the incision.  The clinic staff is available to answer your questions during regular business hours.  Please dont hesitate to call and ask to speak to one of the nurses for clinical concerns.  If you have a  medical emergency, go to the nearest emergency room or call 911.  A surgeon from [facility] is always on call at the hospital.  For further questions, please visit [email or web address]    Post Anesthesia Home Care Instructions  Activity: Get plenty of rest for the remainder of the day. A responsible adult should stay with you for 24 hours following the procedure.  For the next 24 hours, DO NOT: -Drive a car -Paediatric nurse -Drink alcoholic beverages -Take any medication unless instructed by your physician -Make any legal decisions or sign important papers.  Meals: Start with liquid foods such as gelatin or soup. Progress to regular foods as tolerated. Avoid greasy, spicy, heavy foods. If nausea and/or vomiting occur, drink only clear liquids until the nausea and/or vomiting subsides. Call your physician if vomiting continues.  Special Instructions/Symptoms: Your throat may feel dry or sore from the anesthesia or the breathing tube placed in your throat during surgery. If this causes discomfort, gargle with warm salt water. The discomfort should disappear within 24 hours.

## 2013-04-11 NOTE — Transfer of Care (Signed)
Immediate Anesthesia Transfer of Care Note  Patient: Tammy Eaton  Procedure(s) Performed: Procedure(s): BREAST LUMPECTOMY WITH NEEDLE LOCALIZATION (Right)  Patient Location: PACU  Anesthesia Type:General  Level of Consciousness: awake  Airway & Oxygen Therapy: Patient Spontanous Breathing and Patient connected to face mask oxygen  Post-op Assessment: Report given to PACU RN and Post -op Vital signs reviewed and stable  Post vital signs: Reviewed and stable  Complications: No apparent anesthesia complications

## 2013-04-11 NOTE — H&P (View-Only) (Signed)
Patient ID: Tammy Eaton, female   DOB: [birth year], 63 y.o.   MRN: [id number]  Chief Complaint  Patient presents with  . Breast Mass    new pt- eval breast papilloma    HPI Tammy Eaton is a 63 y.o. female.   HPI  She is referred by Dr. Luberta Robertson for further evaluation and treatment of a right breast papilloma. She underwent an annual mammogram and was noted to have a mass in the right breast at the 9:00 position. An image guided biopsy was performed demonstrating a papilloma. The mass appeared to be heterogeneous. She's been sent over here to discuss excision. There is no immediate family history of breast cancer. Her mother died from ovarian cancer at the age of 4. She's had 2 previous breast biopsies on the left side that have been benign. Age at menarche was 37. First child was born before the age of 5. Menopause was in her late 60s. She took hormone replacement treatment for 4 years. No breast pain. No nipple discharge.  Past Medical History  Diagnosis Date  . Arthritis   . COPD (chronic obstructive pulmonary disease)   . Emphysema of lung   . Hyperlipidemia   . PONV (postoperative nausea and vomiting)     pt "could not eat during hospital stay"  . lung ca 05/2003    Past Surgical History  Procedure Laterality Date  . Lung lobectomy Left 4/05  . Sinus surgery with instatrak    . Tubal ligation    . Dilation and curettage of uterus    . Eye surgery    . Vocal cord lateralization, endoscopic approach w/ mlb      nodule removed  . Knee surgery      rt knee torn cartilage  . Cholecystectomy  11/11/2011    Procedure: LAPAROSCOPIC CHOLECYSTECTOMY WITH INTRAOPERATIVE CHOLANGIOGRAM;  Surgeon: Odis Hollingshead, MD;  Location: [facility];  Service: General;  Laterality: N/A;  . Cystoscopy  12/10    for evaluation of hematuria  . Breast surgery      Family History  Problem Relation Age of Onset  . Cancer Mother 96    ovarian cancer  . Alcohol abuse Father     Social  History History  Substance Use Topics  . Smoking status: Former Smoker -- 2.00 packs/day for 40 years    Quit date: 09/21/2008  . Smokeless tobacco: Not on file  . Alcohol Use: Yes     Comment: SOCIALLY    Allergies  Allergen Reactions  . Adhesive [Tape]   . Amoxicillin     thrush  . Septra [Sulfamethoxazole-Trimethoprim] Hives  . Oxycodone Rash    Current Outpatient Prescriptions  Medication Sig Dispense Refill  . ALPRAZolam (XANAX) 0.5 MG tablet Take 0.5 mg by mouth 3 (three) times daily as needed. For anxiety      . azelastine (ASTELIN) 137 MCG/SPRAY nasal spray       . CYMBALTA 60 MG capsule Take 60 mg by mouth daily.       . fish oil-omega-3 fatty acids 1000 MG capsule Take 1 g by mouth 3 (three) times daily.       . Fluticasone Furoate-Vilanterol (BREO ELLIPTA) 100-25 MCG/INH AEPB Inhale 1 puff into the lungs daily.      . Glucosamine Sulfate 750 MG TABS Take 750 mg by mouth 2 (two) times daily.      [name] 2 MG TABS Take 2 mg by mouth at bedtime.       Marland Kitchen  Magnesium 500 MG CAPS Take 500 mg by mouth daily.       . Misc Natural Products (TART CHERRY ADVANCED PO) Take 1 tablet by mouth 3 (three) times daily.       . Multiple Vitamins-Minerals (MULTIVITAMIN WITH MINERALS) tablet Take 1 tablet by mouth daily.      . Turmeric 500 MG CAPS Take 1 capsule by mouth daily.      . Vitamin D, Ergocalciferol, (DRISDOL) 50000 UNITS CAPS capsule Take 1 capsule (50,000 Units total) by mouth every 14 (fourteen) days. On Sundays  30 capsule  3   No current facility-administered medications for this visit.    Review of Systems Review of Systems  Constitutional: Negative.   HENT: Negative.   Respiratory: Negative.   Cardiovascular: Negative.   Gastrointestinal: Negative.   Genitourinary: Negative.   Musculoskeletal: Positive for arthralgias.  Hematological: Negative.     Blood pressure 102/72, pulse 64, temperature 98.2 F (36.8 C), temperature source Oral, resp. rate 14, height  5' 5.5" (1.664 m), weight 135 lb 9.6 oz (61.508 kg), last menstrual period 11/03/2005.  Physical Exam Physical Exam  Constitutional: She appears well-developed and well-nourished. No distress.  HENT:  Head: Normocephalic and atraumatic.  Eyes: No scleral icterus.  Neck: Neck supple.  Cardiovascular: Normal rate and regular rhythm.   Pulmonary/Chest: Effort normal.  Distant breath sounds are clear.  Right breast demonstrates a small puncture wound laterally as well as a resolving rash medial to this. No palpable masses. Left breast demonstrates 2 well-healed scars and no palpable masses.  Musculoskeletal:  No axillary or supraclavicular adenopathy  Lymphadenopathy:    She has no cervical adenopathy.  Neurological: She is alert.  Skin: Skin is warm and dry.  Psychiatric: She has a normal mood and affect. Her behavior is normal.    Data Reviewed Breast images. Pathology report-this was discussed with the pathologist.  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     Plan    Excision of right breast papilloma after wire localization.  I have explained the procedure, risks, and aftercare to her.  Risks include but are not limited to bleeding, infection, wound problems, seroma formation, missing the lesion, anesthesia.  She seems to understand and agrees with the plan.        Edahi Kroening J 03/23/2013, 12:18 PM

## 2013-04-11 NOTE — Anesthesia Postprocedure Evaluation (Signed)
  Anesthesia Post-op Note  Patient: Tammy Eaton  Procedure(s) Performed: Procedure(s): BREAST LUMPECTOMY WITH NEEDLE LOCALIZATION (Right)  Patient Location: PACU  Anesthesia Type:General  Level of Consciousness: awake, alert  and oriented  Airway and Oxygen Therapy: Patient Spontanous Breathing  Post-op Pain: mild  Post-op Assessment: Post-op Vital signs reviewed  Post-op Vital Signs: Reviewed  Complications: No apparent anesthesia complications

## 2013-04-11 NOTE — Anesthesia Procedure Notes (Signed)
Procedure Name: LMA Insertion Date/Time: 04/11/2013 11:41 AM Performed by: Lieutenant Diego Pre-anesthesia Checklist: Patient identified, Emergency Drugs available, Suction available and Patient being monitored Patient Re-evaluated:Patient Re-evaluated prior to inductionOxygen Delivery Method: Circle System Utilized Preoxygenation: Pre-oxygenation with 100% oxygen Intubation Type: IV induction Ventilation: Mask ventilation without difficulty LMA: LMA inserted LMA Size: 4.0 Number of attempts: 1 Airway Equipment and Method: bite block Placement Confirmation: positive ETCO2 and breath sounds checked- equal and bilateral Tube secured with: Tape Dental Injury: Teeth and Oropharynx as per pre-operative assessment

## 2013-04-13 ENCOUNTER — Encounter (HOSPITAL_BASED_OUTPATIENT_CLINIC_OR_DEPARTMENT_OTHER): Payer: Self-pay | Admitting: General Surgery

## 2013-04-14 ENCOUNTER — Telehealth (INDEPENDENT_AMBULATORY_CARE_PROVIDER_SITE_OTHER): Payer: Self-pay

## 2013-04-14 NOTE — Telephone Encounter (Signed)
Pt made aware of her benign pathology results.

## 2013-04-27 ENCOUNTER — Ambulatory Visit (INDEPENDENT_AMBULATORY_CARE_PROVIDER_SITE_OTHER): Payer: 59 | Admitting: General Surgery

## 2013-04-27 VITALS — BP 96/66 | HR 68 | Temp 98.4°F | Ht 65.0 in | Wt 135.0 lb

## 2013-04-27 DIAGNOSIS — Z4889 Encounter for other specified surgical aftercare: Secondary | ICD-10-CM

## 2013-04-27 NOTE — Patient Instructions (Signed)
Activities as tolerated. 

## 2013-04-27 NOTE — Progress Notes (Signed)
Procedure:  Right breast lumpectomy after wire localization  Date:  04/11/2013  Pathology:  Intraductal papilloma and radial scar. No malignancy.  History:  She is here for her first postoperative visit. She has some incisional soreness when moving her arm. She is aware of her pathology.  Exam: General- Is in NAD. Right breast-lateral incision is soft, clean, and intact.  Assessment:  Wound is healing well and pathology is benign.  Plan:  Activities as tolerated. Return visit as needed.

## 2013-09-23 ENCOUNTER — Encounter: Payer: Self-pay | Admitting: *Deleted

## 2013-10-14 ENCOUNTER — Telehealth: Payer: Self-pay | Admitting: Hematology

## 2013-10-14 NOTE — Telephone Encounter (Signed)
Lvm advising appt time chg on 10/22 from 1pm to 8.30am. Also mailed revised appt calendar.

## 2013-11-07 ENCOUNTER — Encounter: Payer: Self-pay | Admitting: Nurse Practitioner

## 2013-11-07 ENCOUNTER — Ambulatory Visit (INDEPENDENT_AMBULATORY_CARE_PROVIDER_SITE_OTHER): Payer: 59 | Admitting: Nurse Practitioner

## 2013-11-07 VITALS — BP 112/76 | HR 60 | Temp 98.7°F | Ht 65.0 in | Wt 133.0 lb

## 2013-11-07 DIAGNOSIS — A499 Bacterial infection, unspecified: Secondary | ICD-10-CM

## 2013-11-07 DIAGNOSIS — R1031 Right lower quadrant pain: Secondary | ICD-10-CM

## 2013-11-07 DIAGNOSIS — B9689 Other specified bacterial agents as the cause of diseases classified elsewhere: Secondary | ICD-10-CM

## 2013-11-07 DIAGNOSIS — N760 Acute vaginitis: Secondary | ICD-10-CM

## 2013-11-07 DIAGNOSIS — N3000 Acute cystitis without hematuria: Secondary | ICD-10-CM

## 2013-11-07 DIAGNOSIS — Z8742 Personal history of other diseases of the female genital tract: Secondary | ICD-10-CM

## 2013-11-07 MED ORDER — METRONIDAZOLE 0.75 % VA GEL: 1.0000 | Freq: Every day | VAGINAL | Status: DC

## 2013-11-07 NOTE — Progress Notes (Signed)
Subjective:     Patient ID: Tammy Eaton, female   DOB: [birth year], 63 y.o.   MRN: [id number]  Urinary Tract Infection  Pertinent negatives include no chills, frequency, nausea, urgency or vomiting.  This 63 yo WM Fe presents with UTI symptoms. In July had UTI and took 3 days of Cipro given by MD on call.  Then 3 weeks later had a recheck urine culture and was negative.  Then on 8/24 same symptoms of UTI with hematuria and was treated with 7 days of Cipro.   Then urine culture was negative but continued with antibiotics.  She was then sent on 9/22 to Urologist and had bladder cysto which was negative and culture again was negative.  Symtpoms started now about 2 weeks ago with pressure at the urethra and dysuria.  About the same time started having vaginal discharge and she self treated with Monistat 3 with a little help.  Still having a vaginal discharge that is clear to white.   Also in the past few months having intense vaso symptoms. She also has noted right buttocks into upper thigh with radiation to right lower quadrant a sharp shooting pain.  This is similar to the "pain she noted with her cycle".  On 8/24 with presence of blood when she wiped, now wonders if this was vaginal or urethral.  Mother with OV cancer age 68. She is quite concerned that this may be related to ovaries and would like further evaluation.   Review of Systems  Constitutional: Negative for fever, chills and fatigue.  Gastrointestinal: Negative for nausea, vomiting, abdominal pain, diarrhea, constipation and abdominal distention.  Genitourinary: Positive for dysuria, vaginal discharge and pelvic pain. Negative for urgency and frequency.  Musculoskeletal: Positive for arthralgias.       Right hip and thigh  Skin: Negative.   Neurological: Negative for dizziness, light-headedness and numbness.       Objective:   Physical Exam  Constitutional: She is oriented to person, place, and time. She appears well-developed and  well-nourished.  Abdominal: Soft. She exhibits no distension. There is no tenderness. There is no rebound and no guarding.  Genitourinary:  Thin white to grey vaginal discharge. No pain at the urethra. No vaginal bleeding. On bimanual there is no mass or similar pain that can be reproduced. Wet prep: PH: 5.0; NSS: + clue cells; KOH: no yeast.  Musculoskeletal:  No pain to internal, external rotation of right hip.  There is some pain at the right SI joint space - but different in character than she had noted previously.  Neurological: She is alert and oriented to person, place, and time.  Psychiatric: She has a normal mood and affect. Her behavior is normal. Judgment and thought content normal.       Assessment:     BV R/O UTI Right hip/ thigh/ lower pelvic pain Recent hematuria VS. Vaginal spotting FMH: mother history of OV cancer - patient with concerns    Plan:     Will get urine C&S and follow - no treatment stated yet Will start on Metrogel HS X 5 If gets yeast symptoms to call back Will get PUS and follow Will also get PUS and follow

## 2013-11-07 NOTE — Patient Instructions (Signed)
Bacterial Vaginosis Bacterial vaginosis is a vaginal infection that occurs when the normal balance of bacteria in the vagina is disrupted. It results from an overgrowth of certain bacteria. This is the most common vaginal infection in women of childbearing age. Treatment is important to prevent complications, especially in pregnant women, as it can cause a premature delivery. CAUSES  Bacterial vaginosis is caused by an increase in harmful bacteria that are normally present in smaller amounts in the vagina. Several different kinds of bacteria can cause bacterial vaginosis. However, the reason that the condition develops is not fully understood. RISK FACTORS Certain activities or behaviors can put you at an increased risk of developing bacterial vaginosis, including:  Having a new sex partner or multiple sex partners.  Douching.  Using an intrauterine device (IUD) for contraception. Women do not get bacterial vaginosis from toilet seats, bedding, swimming pools, or contact with objects around them. SIGNS AND SYMPTOMS  Some women with bacterial vaginosis have no signs or symptoms. Common symptoms include:  Grey vaginal discharge.  A fishlike odor with discharge, especially after sexual intercourse.  Itching or burning of the vagina and vulva.  Burning or pain with urination. DIAGNOSIS  Your health care provider will take a medical history and examine the vagina for signs of bacterial vaginosis. A sample of vaginal fluid may be taken. Your health care provider will look at this sample under a microscope to check for bacteria and abnormal cells. A vaginal pH test may also be done.  TREATMENT  Bacterial vaginosis may be treated with antibiotic medicines. These may be given in the form of a pill or a vaginal cream. A second round of antibiotics may be prescribed if the condition comes back after treatment.  HOME CARE INSTRUCTIONS   Only take over-the-counter or prescription medicines as  directed by your health care provider.  If antibiotic medicine was prescribed, take it as directed. Make sure you finish it even if you start to feel better.  Do not have sex until treatment is completed.  Tell all sexual partners that you have a vaginal infection. They should see their health care provider and be treated if they have problems, such as a mild rash or itching.  Practice safe sex by using condoms and only having one sex partner. SEEK MEDICAL CARE IF:   Your symptoms are not improving after 3 days of treatment.  You have increased discharge or pain.  You have a fever. MAKE SURE YOU:   Understand these instructions.  Will watch your condition.  Will get help right away if you are not doing well or get worse. FOR MORE INFORMATION  Centers for Disease Control and Prevention, Division of STD Prevention: www.cdc.gov/std American Sexual Health Association (ASHA): www.ashastd.org  Document Released: 01/20/2005 Document Revised: 11/10/2012 Document Reviewed: 09/01/2012 ExitCare Patient Information 2015 [employer]. This information is not intended to replace advice given to you by your health care provider. Make sure you discuss any questions you have with your health care provider.  

## 2013-11-08 LAB — URINALYSIS, MICROSCOPIC ONLY
Bacteria, UA: NONE SEEN
CASTS: NONE SEEN
CRYSTALS: NONE SEEN
Squamous Epithelial / LPF: NONE SEEN

## 2013-11-09 LAB — URINE CULTURE
COLONY COUNT: NO GROWTH
ORGANISM ID, BACTERIA: NO GROWTH

## 2013-11-10 NOTE — Progress Notes (Signed)
Encounter reviewed by Dr. Josefa Half. May need endometrial biopsy.

## 2013-11-14 NOTE — Addendum Note (Signed)
Addended by: Michele Mcalpine on: 11/14/2013 11:41 AM   Modules accepted: Orders

## 2013-11-21 ENCOUNTER — Telehealth: Payer: Self-pay | Admitting: Nurse Practitioner

## 2013-11-21 ENCOUNTER — Other Ambulatory Visit: Payer: Self-pay | Admitting: Nurse Practitioner

## 2013-11-21 MED ORDER — PROMETHAZINE HCL 25 MG PO TABS: 25.0000 mg | ORAL_TABLET | Freq: Four times a day (QID) | ORAL | Status: DC | PRN

## 2013-11-21 MED ORDER — METRONIDAZOLE 500 MG PO TABS: 500.0000 mg | ORAL_TABLET | Freq: Two times a day (BID) | ORAL | Status: DC

## 2013-11-21 NOTE — Telephone Encounter (Signed)
Order placed for Flagyl and Phenergan if needed for nausea since we did discuss this at time of visit.

## 2013-11-21 NOTE — Telephone Encounter (Signed)
Call to patient,(no answer on cell, called home number since later in evening) notified that Tammy Eaton has sent RX to pharmacy. If this does not work will need OV. She is scheduled for ultrasound next week. Alcohol precautions given for Flagyl and sedation side effect from Phenergan  Encounter closed.

## 2013-11-21 NOTE — Telephone Encounter (Signed)
Patient was treated for a  vaginal infection and doesn't feel the medication is working.

## 2013-11-21 NOTE — Telephone Encounter (Signed)
Return call to patient. States seen by patty on 11-07-13 and treated for BV. Chose to use Metrogel instead of oral pill to avoid nausea. Used for full 5 days, felt better for 2 days then symptoms retruned. Has used vinegar and water douche X2. Complains of white vaginal discharge, different from yeast infection.Denies odor to discharge. Slight itching but different than the intense itching of a yeast infection. States this is exactly like it was on 11-07-13 Requesting oral pill as well as medication for nausea be called in. Advised that she may need OV for evaluation since Metrogel did not work but will send to [facility] for advise. Patient wants call back to [phone] number so as not to wake her grandbaby.

## 2013-11-24 ENCOUNTER — Ambulatory Visit (HOSPITAL_BASED_OUTPATIENT_CLINIC_OR_DEPARTMENT_OTHER): Payer: 59 | Admitting: Hematology

## 2013-11-24 ENCOUNTER — Telehealth: Payer: Self-pay | Admitting: Hematology

## 2013-11-24 ENCOUNTER — Encounter: Payer: Self-pay | Admitting: Hematology

## 2013-11-24 ENCOUNTER — Other Ambulatory Visit (HOSPITAL_BASED_OUTPATIENT_CLINIC_OR_DEPARTMENT_OTHER): Payer: 59

## 2013-11-24 VITALS — BP 100/42 | HR 81 | Temp 98.1°F | Resp 18 | Ht 65.0 in | Wt 134.3 lb

## 2013-11-24 DIAGNOSIS — C3411 Malignant neoplasm of upper lobe, right bronchus or lung: Secondary | ICD-10-CM

## 2013-11-24 DIAGNOSIS — Z85118 Personal history of other malignant neoplasm of bronchus and lung: Secondary | ICD-10-CM

## 2013-11-24 DIAGNOSIS — C3410 Malignant neoplasm of upper lobe, unspecified bronchus or lung: Secondary | ICD-10-CM

## 2013-11-24 LAB — COMPREHENSIVE METABOLIC PANEL (CC13)
ALK PHOS: 86 U/L (ref 40–150)
ALT: 19 U/L (ref 0–55)
ANION GAP: 10 meq/L (ref 3–11)
AST: 16 U/L (ref 5–34)
Albumin: 3.9 g/dL (ref 3.5–5.0)
BUN: 12.7 mg/dL (ref 7.0–26.0)
CO2: 27 mEq/L (ref 22–29)
CREATININE: 0.8 mg/dL (ref 0.6–1.1)
Calcium: 9.6 mg/dL (ref 8.4–10.4)
Chloride: 107 mEq/L (ref 98–109)
Glucose: 81 mg/dl (ref 70–140)
Potassium: 3.9 mEq/L (ref 3.5–5.1)
SODIUM: 144 meq/L (ref 136–145)
Total Bilirubin: 0.74 mg/dL (ref 0.20–1.20)
Total Protein: 7 g/dL (ref 6.4–8.3)

## 2013-11-24 LAB — CBC WITH DIFFERENTIAL/PLATELET
BASO%: 1.2 % (ref 0.0–2.0)
Basophils Absolute: 0.1 10*3/uL (ref 0.0–0.1)
EOS ABS: 0.2 10*3/uL (ref 0.0–0.5)
EOS%: 2.6 % (ref 0.0–7.0)
HEMATOCRIT: 49 % — AB (ref 34.8–46.6)
HGB: 16 g/dL — ABNORMAL HIGH (ref 11.6–15.9)
LYMPH%: 35 % (ref 14.0–49.7)
MCH: 28.9 pg (ref 25.1–34.0)
MCHC: 32.6 g/dL (ref 31.5–36.0)
MCV: 88.7 fL (ref 79.5–101.0)
MONO#: 0.4 10*3/uL (ref 0.1–0.9)
MONO%: 7.1 % (ref 0.0–14.0)
NEUT%: 54.1 % (ref 38.4–76.8)
NEUTROS ABS: 3.1 10*3/uL (ref 1.5–6.5)
PLATELETS: 265 10*3/uL (ref 145–400)
RBC: 5.53 10*6/uL — ABNORMAL HIGH (ref 3.70–5.45)
RDW: 13.7 % (ref 11.2–14.5)
WBC: 5.7 10*3/uL (ref 3.9–10.3)
lymph#: 2 10*3/uL (ref 0.9–3.3)

## 2013-11-24 NOTE — Progress Notes (Signed)
[facility] PROGRESS NOTE DATE OF VISIT: 11/24/2013  Marjorie Smolder, MD [place] [address] [place] [address]  DIAGNOSIS: Malignant neoplasm of upper lobe of right lung - Plan: CBC with Differential, Comprehensive metabolic panel (Cmet) - [facility], CT Chest Wo Contrast  Chief Complaint  Patient presents with  . Follow-up    CURRENT THERAPY: Observation.  INTERVAL HISTORY: Tammy Eaton 63 y.o. 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  She was las seen one year ago on 11/25/2012 by Dr Juliann Mule. She continues to do well.  Her CT scan of the chest with IV contrast on 11/24/2011 demonstrated no evidence of disease recurrence. Another scan was done last year detailed below.   Her mammogram 02/24/13 showed mass in right breast, ultrasound 03/07/13 showed 9 x 6 x 5 mm size mass.  US guided core biopsy was done 03/07/13 showed  Ductal papilloma. Dr Zella Richer did a lumpectomy on 04/11/13 showing Intraductal papilloma with ADH.     Her next colonoscopy is due in 2017.   She is a former smoker (2 packs per year for the last 45 years).  She quit in 2010.  She denies any weight lost or acute shortness of breath or fevers/chills. She did get her flu shot. 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 I discussed with her the data about chemoprevention in setting of ADH but she does not want to take the antiestrogen chemo preventive drugs like Tamoxifen or Arimidex. She had cholecystectomy 11/11/2011 showing minimal chronic cholecystitis, no cancer and a stomach biopsy in 10/2011 showed reactive gastropathy negative for h. Pylori. Last CT scan chest was dated 12/08/2012 and showed:  No pathologically enlarged mediastinal, hilar or axillary lymph nodes. Atherosclerotic calcification of the arterial vasculature, including left anterior descending, right main and likely circumflex  coronary arteries. Heart size normal. No pericardial effusion. Right apical pleural parenchymal scarring. Severe centrilobular emphysema. Calcification in the right upper lobe. Azygos fissure is incidentally noted. Right lung is slightly hypertrophied. Postoperative changes of left upper lobectomy. Subpleural scarring in the peripheral left lower lobe, unchanged. No pleural fluid. Airway is unremarkable. Incidental imaging of the upper abdomen shows a 7 mm low-attenuation lesion in the peripheral left hepatic lobe, as before. No worrisome lytic or sclerotic lesions. Degenerative changes are seen in the spine.  IMPRESSION: 1. Postoperative changes of left upper lobectomy without evidence of recurrent or metastatic disease.  2. Severe centrilobular emphysema.  3. Coronary artery calcification.       MEDICAL HISTORY: Past Medical History  Diagnosis Date  . Arthritis   . COPD (chronic obstructive pulmonary disease)   . Emphysema of lung   . Hyperlipidemia   . PONV (postoperative nausea and vomiting)     pt "could not eat during hospital stay"  . Shortness of breath   . Wears glasses   . lung ca 05/2003  . Depression   . Reflux esophagitis   . Lung cancer     s/p LUL lobectomy  . Hyperlipidemia   . Osteoarthritis     spine, knees, hands  . Rhinitis     INTERIM HISTORY: has Symptomatic cholelithiasis; Lung cancer, upper lobe; and Papilloma of right breast on her problem list.    ALLERGIES:  is allergic to adhesive; amoxicillin; septra; and oxycodone.  MEDICATIONS: has a current medication list which includes the following prescription(s): acetaminophen, albuterol, alprazolam, cetirizine,  cholecalciferol, cymbalta, fish oil-omega-3 fatty acids, fluticasone furoate-vilanterol, glucosamine sulfate, lunesta, magnesium, metronidazole, metronidazole, misc natural products, multivitamin with minerals, promethazine, and pseudoephedrine.  SURGICAL HISTORY:  Past Surgical History  Procedure  Laterality Date  . Lung lobectomy Left 4/05  . Sinus surgery with instatrak    . Tubal ligation    . Dilation and curettage of uterus    . Vocal cord lateralization, endoscopic approach w/ mlb      nodule removed  . Knee surgery      rt knee torn cartilage  . Cystoscopy  12/10    for evaluation of hematuria  . Breast surgery  1992    lt br bx  . Eye surgery      catataracts-both  . Retinal detachment surgery  2011    left  . Cholecystectomy  11/11/2011    Procedure: LAPAROSCOPIC CHOLECYSTECTOMY WITH INTRAOPERATIVE CHOLANGIOGRAM;  Surgeon: Odis Hollingshead, MD;  Location: [facility];  Service: General;  Laterality: N/A;  . Breast lumpectomy with needle localization Right 04/11/2013    Procedure: BREAST LUMPECTOMY WITH NEEDLE LOCALIZATION;  Surgeon: Odis Hollingshead, MD;  Location: [facility];  Service: General;  Laterality: Right;    REVIEW OF SYSTEMS:   Constitutional: Denies fevers, chills or abnormal weight loss Eyes: Denies blurriness of vision Ears, nose, mouth, throat, and face: Denies mucositis or sore throat Respiratory: Denies cough, dyspnea or wheezes Cardiovascular: Denies palpitation, chest discomfort or lower extremity swelling Gastrointestinal:  Denies nausea, heartburn or change in bowel habits Skin: Denies abnormal skin rashes Lymphatics: Denies new lymphadenopathy or easy bruising Neurological:Denies numbness, tingling or new weaknesses Behavioral/Psych: Mood is stable, no new changes  All other systems were reviewed with the patient and are negative.  PHYSICAL EXAMINATION: ECOG PERFORMANCE STATUS: 0  Blood pressure 100/42, pulse 81, temperature 98.1 F (36.7 C), temperature source Oral, resp. rate 18, height 5\' 5"  (1.651 m), weight 134 lb 4.8 oz (60.918 kg), last menstrual period 11/03/2005.  GENERAL:alert, no distress and comfortable SKIN: skin color, texture, turgor are normal, no rashes or significant lesions;  Scar from her left chest surgery  is without evidence of local occurrence.  EYES: normal, Conjunctiva are pink and non-injected, sclera clear OROPHARYNX:no exudate, no erythema and lips, buccal mucosa, and tongue normal  NECK: supple, thyroid normal size, non-tender, without nodularity LYMPH:  no palpable lymphadenopathy in the cervical, axillary or supraclavicular LUNGS: clear to auscultation and percussion with normal breathing effort HEART: regular rate & rhythm and no murmurs and no lower extremity edema ABDOMEN:abdomen soft, non-tender and normal bowel sounds; No organomegaly.  Musculoskeletal:no cyanosis of digits and no clubbing  NEURO: alert & oriented x 3 with fluent speech, no focal motor/sensory deficits  LABORATORY DATA: Results for orders placed in visit on 11/24/13 (from the past 48 hour(s))  CBC WITH DIFFERENTIAL     Status: Abnormal   Collection Time    11/24/13  8:44 AM      Result Value Ref Range   WBC 5.7  3.9 - 10.3 10e3/uL   NEUT# 3.1  1.5 - 6.5 10e3/uL   HGB 16.0 (*) 11.6 - 15.9 g/dL   HCT 49.0 (*) 34.8 - 46.6 %   Platelets 265  145 - 400 10e3/uL   MCV 88.7  79.5 - 101.0 fL   MCH 28.9  25.1 - 34.0 pg   MCHC 32.6  31.5 - 36.0 g/dL   RBC 5.53 (*) 3.70 - 5.45 10e6/uL   RDW 13.7  11.2 - 14.5 %  lymph# 2.0  0.9 - 3.3 10e3/uL   MONO# 0.4  0.1 - 0.9 10e3/uL   Eosinophils Absolute 0.2  0.0 - 0.5 10e3/uL   Basophils Absolute 0.1  0.0 - 0.1 10e3/uL   NEUT% 54.1  38.4 - 76.8 %   LYMPH% 35.0  14.0 - 49.7 %   MONO% 7.1  0.0 - 14.0 %   EOS% 2.6  0.0 - 7.0 %   BASO% 1.2  0.0 - 2.0 %  COMPREHENSIVE METABOLIC PANEL (XK48)     Status: None   Collection Time    11/24/13  8:44 AM      Result Value Ref Range   Sodium 144  136 - 145 mEq/L   Potassium 3.9  3.5 - 5.1 mEq/L   Chloride 107  98 - 109 mEq/L   CO2 27  22 - 29 mEq/L   Glucose 81  70 - 140 mg/dl   BUN 12.7  7.0 - 26.0 mg/dL   Creatinine 0.8  0.6 - 1.1 mg/dL   Total Bilirubin 0.74  0.20 - 1.20 mg/dL   Alkaline Phosphatase 86  40 - 150 U/L    AST 16  5 - 34 U/L   ALT 19  0 - 55 U/L   Total Protein 7.0  6.4 - 8.3 g/dL   Albumin 3.9  3.5 - 5.0 g/dL   Calcium 9.6  8.4 - 10.4 mg/dL   Anion Gap 10  3 - 11 mEq/L   Labs:  Lab Results  Component Value Date   WBC 5.7 11/24/2013   HGB 16.0* 11/24/2013   HCT 49.0* 11/24/2013   MCV 88.7 11/24/2013   PLT 265 11/24/2013   NEUTROABS 3.1 11/24/2013      Chemistry      Component Value Date/Time   NA 144 11/24/2013 0844   NA 144 04/06/2013 1430   K 3.9 11/24/2013 0844   K 4.3 04/06/2013 1430   CL 103 04/06/2013 1430   CL 104 11/27/2011 1349   CO2 27 11/24/2013 0844   CO2 28 04/06/2013 1430   BUN 12.7 11/24/2013 0844   BUN 12 04/06/2013 1430   CREATININE 0.8 11/24/2013 0844   CREATININE 0.63 04/06/2013 1430      Component Value Date/Time   CALCIUM 9.6 11/24/2013 0844   CALCIUM 9.2 04/06/2013 1430   ALKPHOS 86 11/24/2013 0844   ALKPHOS 62 04/06/2013 1430   AST 16 11/24/2013 0844   AST 23 04/06/2013 1430   ALT 19 11/24/2013 0844   ALT 19 04/06/2013 1430   BILITOT 0.74 11/24/2013 0844   BILITOT 0.8 04/06/2013 1430     Basic Metabolic Panel:  Recent Labs Lab 11/24/13 0844  NA 144  K 3.9  CO2 27  GLUCOSE 81  BUN 12.7  CREATININE 0.8  CALCIUM 9.6   GFR Estimated Creatinine Clearance: 64.8 ml/min (by C-G formula based on Cr of 0.8). Liver Function Tests:  Recent Labs Lab 11/24/13 0844  AST 16  ALT 19  ALKPHOS 86  BILITOT 0.74  PROT 7.0  ALBUMIN 3.9   CBC:  Recent Labs Lab 11/24/13 0844  WBC 5.7  NEUTROABS 3.1  HGB 16.0*  HCT 49.0*  MCV 88.7  PLT 265   Studies:  No results found.   RADIOGRAPHIC STUDIES: No results found.  ASSESSMENT: Tammy Eaton 63 y.o. female with a history of Malignant neoplasm of upper lobe of right lung - Plan: CBC with Differential, Comprehensive metabolic panel (Cmet) - [facility], CT Chest Wo Contrast  PLAN:  1. Lung cancer, upper lobe (Stage IA) s/p lobectomy in 2005.  --Debara is now out 10 years from the time of diagnosis.  We again  advised her that it was not necessary for her to see [place] again but she would prefer to see [place] again in one year. Based on current screening guidelines, she would qualify for an annual low contrast surveillance CT of her chest.   She is within the age group is 69-74 with patients who have a greater than 30 pack year history, and who are within the 15 year window  after having stopped smoking. The risk of further contrast CT of chest including second malignancies outweigh potential benefits.  We will obtain a low contrast CT of Chest in 1 year (October 2016)  2, Follow-up.  Will plan to see Lucill again in 1 year at which time we will check CBC, CMET.   All questions were answered. The patient knows to call the clinic with any problems, questions or concerns. We can certainly see the patient much sooner if necessary.  I spent 15 minutes counseling the patient face to face. The total time spent in the appointment was 25 minutes.  Bernadene Bell, MD Medical Hematologist/Oncologist [place] [facility] Pager: [phone] Office No: [phone]

## 2013-11-24 NOTE — Telephone Encounter (Signed)
Pt confirmed labs/ov per 10/22 POF, gave pt AVS......Marland Kitchen KJ

## 2013-12-01 ENCOUNTER — Ambulatory Visit (INDEPENDENT_AMBULATORY_CARE_PROVIDER_SITE_OTHER): Payer: 59

## 2013-12-01 ENCOUNTER — Encounter: Payer: Self-pay | Admitting: Obstetrics and Gynecology

## 2013-12-01 ENCOUNTER — Other Ambulatory Visit: Payer: 59

## 2013-12-01 ENCOUNTER — Ambulatory Visit (INDEPENDENT_AMBULATORY_CARE_PROVIDER_SITE_OTHER): Payer: 59 | Admitting: Obstetrics and Gynecology

## 2013-12-01 ENCOUNTER — Other Ambulatory Visit: Payer: 59 | Admitting: Obstetrics and Gynecology

## 2013-12-01 VITALS — BP 100/60 | HR 60 | Ht 65.0 in | Wt 132.0 lb

## 2013-12-01 DIAGNOSIS — N950 Postmenopausal bleeding: Secondary | ICD-10-CM

## 2013-12-01 DIAGNOSIS — R1031 Right lower quadrant pain: Secondary | ICD-10-CM

## 2013-12-01 NOTE — Progress Notes (Signed)
  Subjective  Patient is here for pelvic ultrasound today for right lower quadrant pain and spotting with wiping.  Pain is described as twinges.  Had two episodes of bright red bleeding this last summer in July 4 and August 24.  Thought it may have been bladder in origin from UTI.  Mother with ovarian cancer at age 63.   Patient is status post BTL and has a history of fibroids.  Not on any HRT.  Having return to hot flashes.   History of atypical ductal hyperplasia of the breast.  Status post lumpectomy in March 2015.  Paternal grandmother with breast cancer.   Patient is a lung cancer survivor.   Had lobectomy of left lung. Former smoker.   Cares for granddaughter.   Objective  See ultrasound - images and report reviewed with patient Uterus with 2.29 mm EMS and fluid inside endometrial canal - 19 x 4 mm. Peripheral calcifications of the ovaries.  18 x 16 mm echogenic cyst of the posterior cervix - Nabothian cyst.  No free fluid.     Assessment   Postmenopausal bleeding.  Fluid in the endometrial canal. Family history of ovarian cancer.   Plan  Ultrasound findings discussed. Return for endometrial biopsy.  Procedure explained to patient.   25 minutes faces to face time of which over 50% was spent in counseling.   After visit summary to patient.

## 2013-12-05 ENCOUNTER — Telehealth: Payer: Self-pay | Admitting: Obstetrics and Gynecology

## 2013-12-05 ENCOUNTER — Encounter: Payer: Self-pay | Admitting: Obstetrics and Gynecology

## 2013-12-05 NOTE — Telephone Encounter (Signed)
Spoke with patient. Advised that per benefit quote received, she will be responsible for $40 copay when she comes in for EMB. Marland Kitchen Also advised that there will be associated pathology charges, but that because they are billed by the lab. I am unable to quote those costs. Patient agreeable. Scheduled procedure for 11/18. Patient would like to be put on wait list to be called in case of cancellation. (Starla added to wait list).

## 2013-12-21 ENCOUNTER — Encounter: Payer: Self-pay | Admitting: Obstetrics and Gynecology

## 2013-12-21 ENCOUNTER — Ambulatory Visit (INDEPENDENT_AMBULATORY_CARE_PROVIDER_SITE_OTHER): Payer: 59 | Admitting: Obstetrics and Gynecology

## 2013-12-21 DIAGNOSIS — N950 Postmenopausal bleeding: Secondary | ICD-10-CM

## 2013-12-21 NOTE — Progress Notes (Signed)
Patient took 800 mg of Ibuprofen at 8:15

## 2013-12-21 NOTE — Patient Instructions (Signed)

## 2013-12-21 NOTE — Progress Notes (Signed)
Patient ID: Tammy Eaton, female   DOB: Jun 19, 1950, 63 y.o.   MRN: [id number]  GYNECOLOGY  VISIT   HPI: 63 y.o.   Married  Caucasian  female   G2P2002 with Patient's last menstrual period was 09/26/2013.   here for   Endometrial biopsy for postmenopausal bleeding.   Ultrasound 12/01/13 -  Uterus with 2.29 mm EMS and fluid inside endometrial canal - 19 x 4 mm. Peripheral calcifications of the ovaries.  18 x 16 mm cyst of the posterior cervix - Nabothian cyst.  No free fluid.  Patient is also noting vaginal discharge.  No odor or itching.  Slight itching.  "Nothing like a yeast infection."  Family history of ovarian cancer.   GYNECOLOGIC HISTORY: Patient's last menstrual period was 09/26/2013.          OB History    Gravida Para Term Preterm AB TAB SAB Ectopic Multiple Living   2 2 2       2          Patient Active Problem List   Diagnosis Date Noted  . 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 Lung cancer, upper lobe 11/27/2011  . Symptomatic cholelithiasis 09/22/2011    Past Medical History  Diagnosis Date  . Arthritis   . COPD (chronic obstructive pulmonary disease)   . Emphysema of lung   . Hyperlipidemia   . PONV (postoperative nausea and vomiting)     pt "could not eat during hospital stay"  . Shortness of breath   . Wears glasses   . lung ca 05/2003  . Depression   . Reflux esophagitis   . Lung cancer     s/p LUL lobectomy  . Hyperlipidemia   . Osteoarthritis     spine, knees, hands  . Rhinitis     Past Surgical History  Procedure Laterality Date  . Lung lobectomy Left 4/05  . Sinus surgery with instatrak    . Tubal ligation    . Dilation and curettage of uterus    . Vocal cord lateralization, endoscopic approach w/ mlb      nodule removed  . Knee surgery      rt knee torn cartilage  . Cystoscopy  12/10    for evaluation of hematuria  . Breast surgery  1992    lt br bx  . Eye surgery      catataracts-both  . Retinal detachment surgery   2011    left  . Cholecystectomy  11/11/2011    Procedure: LAPAROSCOPIC CHOLECYSTECTOMY WITH INTRAOPERATIVE CHOLANGIOGRAM;  Surgeon: Odis Hollingshead, MD;  Location: [facility];  Service: General;  Laterality: N/A;  . Breast lumpectomy with needle localization Right 04/11/2013    Procedure: BREAST LUMPECTOMY WITH NEEDLE LOCALIZATION;  Surgeon: Odis Hollingshead, MD;  Location: [place];  Service: General;  Laterality: Right;    Current Outpatient Prescriptions  Medication Sig Dispense Refill  . acetaminophen (TYLENOL) 325 MG tablet Take 650 mg by mouth every 6 (six) hours as needed.    Marland Kitchen albuterol (PROVENTIL HFA;VENTOLIN HFA) 108 (90 BASE) MCG/ACT inhaler Inhale 2 puffs into the lungs every 6 (six) hours as needed for wheezing or shortness of breath.    . ALPRAZolam (XANAX) 0.5 MG tablet Take 0.5 mg by mouth 3 (three) times daily as needed. For anxiety    . cetirizine (ZYRTEC) 10 MG tablet Take 10 mg by mouth daily.    . cholecalciferol (VITAMIN D) 1000 UNITS tablet Take 1,000 Units by mouth  daily.    . CYMBALTA 60 MG capsule Take 60 mg by mouth daily.     . fish oil-omega-3 fatty acids 1000 MG capsule Take 1 g by mouth 3 (three) times daily.     . Fluticasone Furoate-Vilanterol (BREO ELLIPTA) 100-25 MCG/INH AEPB Inhale into the lungs.    . Glucosamine Sulfate 750 MG TABS Take 750 mg by mouth 2 (two) times daily.    [name] 2 MG TABS Take 2 mg by mouth at bedtime.     . Magnesium 500 MG CAPS Take 500 mg by mouth daily.     . metroNIDAZOLE (METROGEL) 0.75 % vaginal gel Place 1 Applicatorful vaginally at bedtime. 70 g 0  . Misc Natural Products (TART CHERRY ADVANCED PO) Take 1 tablet by mouth 3 (three) times daily.     . Multiple Vitamins-Minerals (MULTIVITAMIN WITH MINERALS) tablet Take 1 tablet by mouth daily.    . promethazine (PHENERGAN) 25 MG tablet as needed.  0  . pseudoephedrine (SUDAFED) 30 MG tablet Take 30 mg by mouth every 4 (four) hours as needed for congestion.      No current facility-administered medications for this visit.     ALLERGIES: Adhesive; Amoxicillin; Septra; and Oxycodone  Family History  Problem Relation Age of Onset  . Cancer Mother 65    ovarian cancer  . Hypertension Mother   . Diabetes Mother   . Alcohol abuse Father     History   Social History  . Marital Status: Married    Spouse Name: N/A    Number of Children: 2  . Years of Education: N/A   Occupational History  . Not on file.   Social History Main Topics  . Smoking status: Former Smoker -- 2.00 packs/day for 40 years    Quit date: 09/21/2008  . Smokeless tobacco: Not on file  . Alcohol Use: Yes     Comment: SOCIALLY  . Drug Use: No  . Sexual Activity: No   Other Topics Concern  . Not on file   Social History Narrative    ROS:  Pertinent items are noted in HPI.  PHYSICAL EXAMINATION:    BP 118/78 mmHg  Pulse 64  Resp 14  Ht 5\' 5"  (1.651 m)  Wt 133 lb (60.328 kg)  BMI 22.13 kg/m2  LMP 09/26/2013    Pelvic exam - normal external genitalia and urethra.  Cervix and vagina no lesions. No discharge and no odor.  Nabothian cyst posterior cervix 1 cm.  Small and nontender uterus.  No adnexal masses or tenderness.  Procedure - endometrial biopsy Consent performed. Speculum place in vagina.  Sterile prep of cervix with hibiclens.  Paracervical block with 10 cc 1% lidocaine - lot [id number], exp - 07/05/14.  Pipelle placed to    9      cm without difficulty twice. Tissue obtained and sent to pathology. Mucous and endometrial tissue.  Speculum removed.  No complications.  General appearance: alert, cooperative and appears stated age   ASSESSMENT  Postmenopausal bleeding.  Fluid in endometrial canal.  Family history of ovarian cancer.   PLAN  Instructions and precautions given.  Biopsy specimen to [facility].  Will contact patient after Thanksgiving holiday with results.  She knows that I am out of the office all next week.      An After Visit  Summary was printed and given to the patient.  __15____ minutes face to face time of which over 50% was spent in counseling.

## 2014-02-20 ENCOUNTER — Ambulatory Visit (INDEPENDENT_AMBULATORY_CARE_PROVIDER_SITE_OTHER): Payer: 59 | Admitting: Nurse Practitioner

## 2014-02-20 ENCOUNTER — Encounter: Payer: Self-pay | Admitting: Nurse Practitioner

## 2014-02-20 VITALS — BP 94/60 | HR 70 | Resp 18 | Ht 66.0 in | Wt 137.0 lb

## 2014-02-20 DIAGNOSIS — Z1211 Encounter for screening for malignant neoplasm of colon: Secondary | ICD-10-CM

## 2014-02-20 DIAGNOSIS — Z0000 Encounter for general adult medical examination without abnormal findings: Secondary | ICD-10-CM

## 2014-02-20 DIAGNOSIS — E559 Vitamin D deficiency, unspecified: Secondary | ICD-10-CM

## 2014-02-20 DIAGNOSIS — Z01419 Encounter for gynecological examination (general) (routine) without abnormal findings: Secondary | ICD-10-CM

## 2014-02-20 MED ORDER — METRONIDAZOLE 0.75 % VA GEL: 1.0000 | Freq: Every day | VAGINAL | Status: DC

## 2014-02-20 NOTE — Progress Notes (Signed)
64 y.o. G15P2002 Married  Caucasian Fe here for annual exam.  Right breast lumpectomy in March last year.  Last CT Scan of lungs was normal.  Still ongoing vaginal discharge that is bothersome.  Something on PUS indicated that vaginal discharge could be related to "fluid in the uterus" ?.  Patient's last menstrual period was 09/26/2013.    PMB on this date.  Last actual menses was about 11/03/2005.      Sexually active: No.  The current method of family planning is post menopausal status.    Exercising: No.  The patient does not participate in regular exercise at present. Smoker:  no  Health Maintenance: Pap:  02/16/12 NEG HR HPV negative  MMG:  3D 02/23/13 ; 03/07/13 Left breast u/s and needle biopsy ; f/u Mammo ; needle localization of the right breast Colonoscopy:  2007- normal f/u in 2017 IFOB is given BMD:  03/11/10 TDaP:  Within 10 years  Labs:  Darcus Austin, MD ; Urine: unable to void       reports that she quit smoking about 5 years ago. She does not have any smokeless tobacco history on file. She reports that she drinks alcohol. She reports that she does not use illicit drugs.  Past Medical History  Diagnosis Date  . Arthritis   . COPD (chronic obstructive pulmonary disease)   . Emphysema of lung   . Hyperlipidemia   . PONV (postoperative nausea and vomiting)     pt "could not eat during hospital stay"  . Shortness of breath   . Wears glasses   . lung ca 05/2003  . Depression   . Reflux esophagitis   . Lung cancer     s/p LUL lobectomy  . Hyperlipidemia   . Osteoarthritis     spine, knees, hands  . Rhinitis     Past Surgical History  Procedure Laterality Date  . Lung lobectomy Left 4/05  . Sinus surgery with instatrak    . Tubal ligation    . Dilation and curettage of uterus    . Vocal cord lateralization, endoscopic approach w/ mlb      nodule removed  . Knee surgery      rt knee torn cartilage  . Cystoscopy  12/10    for evaluation of hematuria  . Breast  surgery  1992    lt br bx  . Eye surgery      catataracts-both  . Retinal detachment surgery  2011    left  . Cholecystectomy  11/11/2011    Procedure: LAPAROSCOPIC CHOLECYSTECTOMY WITH INTRAOPERATIVE CHOLANGIOGRAM;  Surgeon: Odis Hollingshead, MD;  Location: [facility];  Service: General;  Laterality: N/A;  . Breast lumpectomy with needle localization Right 04/11/2013    Procedure: BREAST LUMPECTOMY WITH NEEDLE LOCALIZATION;  Surgeon: Odis Hollingshead, MD;  Location: [place];  Service: General;  Laterality: Right;    Current Outpatient Prescriptions  Medication Sig Dispense Refill  . acetaminophen (TYLENOL) 325 MG tablet Take 650 mg by mouth every 6 (six) hours as needed.    Marland Kitchen albuterol (PROVENTIL HFA;VENTOLIN HFA) 108 (90 BASE) MCG/ACT inhaler Inhale 2 puffs into the lungs every 6 (six) hours as needed for wheezing or shortness of breath.    . ALPRAZolam (XANAX) 0.5 MG tablet Take 0.5 mg by mouth 3 (three) times daily as needed. For anxiety    . cetirizine (ZYRTEC) 10 MG tablet Take 10 mg by mouth daily.    . cholecalciferol (VITAMIN D) 1000 UNITS  tablet Take 1,000 Units by mouth daily.    . CYMBALTA 60 MG capsule Take 60 mg by mouth daily.     . fish oil-omega-3 fatty acids 1000 MG capsule Take 1 g by mouth 3 (three) times daily.     . Fluticasone Furoate-Vilanterol (BREO ELLIPTA) 100-25 MCG/INH AEPB Inhale into the lungs.    . Glucosamine Sulfate 750 MG TABS Take 750 mg by mouth 2 (two) times daily.    Tammy Eaton 2 MG TABS Take 2 mg by mouth at bedtime.     . Magnesium 500 MG CAPS Take 500 mg by mouth daily.     . metroNIDAZOLE (METROGEL) 0.75 % vaginal gel Place 1 Applicatorful vaginally at bedtime. 70 g 0  . Misc Natural Products (TART CHERRY ADVANCED PO) Take 1 tablet by mouth 3 (three) times daily.     . Multiple Vitamins-Minerals (MULTIVITAMIN WITH MINERALS) tablet Take 1 tablet by mouth daily.    . promethazine (PHENERGAN) 25 MG tablet as needed.  0  . pseudoephedrine  (SUDAFED) 30 MG tablet Take 30 mg by mouth every 4 (four) hours as needed for congestion.     No current facility-administered medications for this visit.    Family History  Problem Relation Age of Onset  . Cancer Mother 5    ovarian cancer  . Hypertension Mother   . Diabetes Mother   . Alcohol abuse Father     ROS:  Pertinent items are noted in HPI.  Otherwise, a comprehensive ROS was negative.  Exam:   BP 94/60 mmHg  Pulse 70  Resp 18  Ht 5\' 6"  (1.676 m)  Wt 137 lb (62.143 kg)  BMI 22.12 kg/m2  LMP 09/26/2013 Height: 5\' 6"  (167.6 cm) (with shoes) Ht Readings from Last 3 Encounters:  02/20/14 5\' 6"  (1.676 m)  12/21/13 5\' 5"  (1.651 m)  12/01/13 5\' 5"  (1.651 m)    General appearance: alert, cooperative and appears stated age Head: Normocephalic, without obvious abnormality, atraumatic Neck: no adenopathy, supple, symmetrical, trachea midline and thyroid normal to inspection and palpation Lungs: clear to auscultation bilaterally Breasts: normal appearance, no masses or tenderness, right breast post biopsy site is healed Heart: regular rate and rhythm Abdomen: soft, non-tender; no masses,  no organomegaly Extremities: extremities normal, atraumatic, no cyanosis or edema Skin: Skin color, texture, turgor normal. No rashes or lesions Lymph nodes: Cervical, supraclavicular, and axillary nodes normal. No abnormal inguinal nodes palpated Neurologic: Grossly normal   Pelvic: External genitalia:  no lesions              Urethra:  normal appearing urethra with no masses, tenderness or lesions              Bartholin's and Skene's: normal                 Vagina: normal appearing vagina with normal color and discharge, no lesions              Cervix: anteverted              Pap taken: Yes.   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  Will return for Vit D as the lab has already left for the day  Counseled on breast self exam, mammography screening, adequate intake of calcium and vitamin D, diet and exercise, Kegel's exercises return annually or prn  An After Visit Summary was printed and given to the patient.

## 2014-02-20 NOTE — Patient Instructions (Signed)

## 2014-02-21 NOTE — Progress Notes (Signed)
Encounter reviewed by Dr. Brook Silva.  

## 2014-02-24 LAB — IPS PAP TEST WITH HPV

## 2014-04-03 ENCOUNTER — Other Ambulatory Visit (INDEPENDENT_AMBULATORY_CARE_PROVIDER_SITE_OTHER): Payer: 59

## 2014-04-03 DIAGNOSIS — E559 Vitamin D deficiency, unspecified: Secondary | ICD-10-CM

## 2014-04-04 LAB — VITAMIN D 25 HYDROXY (VIT D DEFICIENCY, FRACTURES): Vit D, 25-Hydroxy: 29 ng/mL — ABNORMAL LOW (ref 30–100)

## 2014-04-11 LAB — FECAL OCCULT BLOOD, IMMUNOCHEMICAL: IFOBT: POSITIVE

## 2014-04-11 NOTE — Addendum Note (Signed)
Addended by: Robley Fries on: 04/11/2014 11:37 AM   Modules accepted: Orders

## 2014-04-18 ENCOUNTER — Telehealth: Payer: Self-pay

## 2014-04-18 NOTE — Telephone Encounter (Signed)
Spoke with patient. Results given as seen below from [place]. Patient is agreeable. "I already have a gastroenterologist. Dr.Schooler at [facility]." Patient is requesting appointment be scheduled for after April 11th as she will be out of town until then. Advised will speak with Dr.Schooler's office and will return call with appointment date and time. Patient is agreeable.

## 2014-04-18 NOTE — Telephone Encounter (Signed)
Spoke with [facility]. Appointment scheduled for 4/12 at 2pm. OV notes and IFOB results sent to [phone] with cover sheet and confirmation for appointment. Spoke with patient. Advised of appointment date and time. Patient is agreeable.  Routing to provider for final review. Patient agreeable to disposition. Will close encounter

## 2014-04-18 NOTE — Telephone Encounter (Signed)
-----   Message from Regina Eck, CNM sent at 04/17/2014  8:50 AM EDT ----- Notify patient she had a positive IFOB and needs GI referral in for evaluation

## 2014-05-16 ENCOUNTER — Other Ambulatory Visit: Payer: Self-pay

## 2014-05-16 DIAGNOSIS — Z1231 Encounter for screening mammogram for malignant neoplasm of breast: Secondary | ICD-10-CM

## 2014-05-22 ENCOUNTER — Ambulatory Visit
Admission: RE | Admit: 2014-05-22 | Discharge: 2014-05-22 | Disposition: A | Discharge status: Home / Self Care | Payer: 59 | Source: Ambulatory Visit

## 2014-05-22 DIAGNOSIS — Z1231 Encounter for screening mammogram for malignant neoplasm of breast: Secondary | ICD-10-CM

## 2014-06-07 ENCOUNTER — Other Ambulatory Visit (HOSPITAL_COMMUNITY): Payer: Self-pay | Admitting: Gastroenterology

## 2014-07-06 ENCOUNTER — Other Ambulatory Visit: Payer: Self-pay

## 2014-09-07 ENCOUNTER — Other Ambulatory Visit: Payer: 59

## 2014-09-07 DIAGNOSIS — E559 Vitamin D deficiency, unspecified: Secondary | ICD-10-CM

## 2014-09-08 LAB — VITAMIN D 25 HYDROXY (VIT D DEFICIENCY, FRACTURES): Vit D, 25-Hydroxy: 27 ng/mL — ABNORMAL LOW (ref 30–100)

## 2014-09-12 ENCOUNTER — Telehealth: Payer: Self-pay | Admitting: *Deleted

## 2014-09-12 NOTE — Telephone Encounter (Signed)
I have attempted to contact this patient by phone with the following results: left message to return call to [place] at [phone] answering machine (home per [facility]).  No personal information given.  [phone] (Home) *Preferred*

## 2014-09-12 NOTE — Telephone Encounter (Signed)
-----   Message from [place], [place] sent at 09/08/2014  7:51 AM EDT ----- Please let pt know that since increase of Vit D to 2000 IU in February the Vit D has actually gone down 2 points.  Is she willing to restart Vit D RX for 3 months?

## 2014-09-13 NOTE — Telephone Encounter (Signed)
Returning a call to [place] via answering machine.

## 2014-09-14 NOTE — Telephone Encounter (Signed)
Pt notified in result note.  Leaving encounter open for future phone calls.

## 2014-09-15 NOTE — Addendum Note (Signed)
Addended by: Abelino Derrick C on: 09/15/2014 01:40 PM   Modules accepted: Orders, SmartSet

## 2014-09-28 NOTE — Telephone Encounter (Signed)
Follow up completed in result note.  Closing encounter.

## 2014-10-17 ENCOUNTER — Telehealth: Payer: Self-pay | Admitting: Hematology

## 2014-10-17 NOTE — Telephone Encounter (Signed)
Moved 10/24 f/u from [facility] to [place]. Left message for patient and also confirmed 10/17 lab/ct. Schedule mailed.

## 2014-11-15 ENCOUNTER — Telehealth: Payer: Self-pay | Admitting: Hematology

## 2014-11-15 NOTE — Telephone Encounter (Signed)
S/w pt, advised 10/24 appt change (md pal). Gave pt appt 10/18 @ 3pm per pt's req.

## 2014-11-20 ENCOUNTER — Other Ambulatory Visit (HOSPITAL_BASED_OUTPATIENT_CLINIC_OR_DEPARTMENT_OTHER): Payer: 59

## 2014-11-20 ENCOUNTER — Other Ambulatory Visit: Payer: Self-pay | Admitting: *Deleted

## 2014-11-20 ENCOUNTER — Ambulatory Visit (HOSPITAL_COMMUNITY)
Admission: RE | Admit: 2014-11-20 | Discharge: 2014-11-20 | Disposition: A | Discharge status: Home / Self Care | Payer: 59 | Source: Ambulatory Visit | Attending: Hematology | Admitting: Hematology

## 2014-11-20 DIAGNOSIS — M5134 Other intervertebral disc degeneration, thoracic region: Secondary | ICD-10-CM | POA: Diagnosis not present

## 2014-11-20 DIAGNOSIS — R918 Other nonspecific abnormal finding of lung field: Secondary | ICD-10-CM | POA: Insufficient documentation

## 2014-11-20 DIAGNOSIS — Z85118 Personal history of other malignant neoplasm of bronchus and lung: Secondary | ICD-10-CM | POA: Diagnosis not present

## 2014-11-20 DIAGNOSIS — J432 Centrilobular emphysema: Secondary | ICD-10-CM | POA: Diagnosis not present

## 2014-11-20 DIAGNOSIS — C3411 Malignant neoplasm of upper lobe, right bronchus or lung: Secondary | ICD-10-CM | POA: Diagnosis not present

## 2014-11-20 DIAGNOSIS — I700 Atherosclerosis of aorta: Secondary | ICD-10-CM | POA: Insufficient documentation

## 2014-11-20 DIAGNOSIS — C3410 Malignant neoplasm of upper lobe, unspecified bronchus or lung: Secondary | ICD-10-CM

## 2014-11-20 DIAGNOSIS — Z87891 Personal history of nicotine dependence: Secondary | ICD-10-CM | POA: Diagnosis not present

## 2014-11-20 LAB — COMPREHENSIVE METABOLIC PANEL (CC13)
ALBUMIN: 3.7 g/dL (ref 3.5–5.0)
ALK PHOS: 78 U/L (ref 40–150)
ALT: 17 U/L (ref 0–55)
AST: 16 U/L (ref 5–34)
Anion Gap: 7 mEq/L (ref 3–11)
BUN: 10.2 mg/dL (ref 7.0–26.0)
CHLORIDE: 108 meq/L (ref 98–109)
CO2: 28 mEq/L (ref 22–29)
Calcium: 9.2 mg/dL (ref 8.4–10.4)
Creatinine: 0.7 mg/dL (ref 0.6–1.1)
EGFR: 88 mL/min/{1.73_m2} — AB (ref >90)
Glucose: 89 mg/dl (ref 70–140)
POTASSIUM: 3.9 meq/L (ref 3.5–5.1)
SODIUM: 143 meq/L (ref 136–145)
Total Bilirubin: 0.87 mg/dL (ref 0.20–1.20)
Total Protein: 6.6 g/dL (ref 6.4–8.3)

## 2014-11-20 LAB — CBC WITH DIFFERENTIAL/PLATELET
BASO%: 1.1 % (ref 0.0–2.0)
Basophils Absolute: 0.1 10e3/uL (ref 0.0–0.1)
EOS%: 2.4 % (ref 0.0–7.0)
Eosinophils Absolute: 0.1 10e3/uL (ref 0.0–0.5)
HCT: 45.5 % (ref 34.8–46.6)
HGB: 15.3 g/dL (ref 11.6–15.9)
LYMPH%: 27.4 % (ref 14.0–49.7)
MCH: 29.4 pg (ref 25.1–34.0)
MCHC: 33.6 g/dL (ref 31.5–36.0)
MCV: 87.5 fL (ref 79.5–101.0)
MONO#: 0.5 10e3/uL (ref 0.1–0.9)
MONO%: 8.5 % (ref 0.0–14.0)
NEUT#: 3.3 10e3/uL (ref 1.5–6.5)
NEUT%: 60.6 % (ref 38.4–76.8)
Platelets: 262 10e3/uL (ref 145–400)
RBC: 5.2 10e6/uL (ref 3.70–5.45)
RDW: 13.6 % (ref 11.2–14.5)
WBC: 5.5 10e3/uL (ref 3.9–10.3)
lymph#: 1.5 10e3/uL (ref 0.9–3.3)

## 2014-11-21 ENCOUNTER — Ambulatory Visit (HOSPITAL_BASED_OUTPATIENT_CLINIC_OR_DEPARTMENT_OTHER): Payer: 59 | Admitting: Hematology

## 2014-11-21 ENCOUNTER — Telehealth: Payer: Self-pay | Admitting: Hematology

## 2014-11-21 ENCOUNTER — Encounter: Payer: Self-pay | Admitting: Hematology

## 2014-11-21 VITALS — BP 99/60 | HR 80 | Temp 98.5°F | Resp 18 | Ht 66.0 in | Wt 143.4 lb

## 2014-11-21 DIAGNOSIS — J449 Chronic obstructive pulmonary disease, unspecified: Secondary | ICD-10-CM | POA: Diagnosis not present

## 2014-11-21 DIAGNOSIS — C3412 Malignant neoplasm of upper lobe, left bronchus or lung: Secondary | ICD-10-CM

## 2014-11-21 DIAGNOSIS — R911 Solitary pulmonary nodule: Secondary | ICD-10-CM

## 2014-11-21 NOTE — Progress Notes (Signed)
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  HISTORY OF PRESENTING ILLNESS:  Tammy Eaton is a wonderful 64 y.o. 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 Lona Kettle on 11/24/2013. She was noted to have an atypical ductal hyperplasia on a breast biopsy and was counseled used any chemo prevention strategies. She had a repeat CT of the chest which shows an indeterminate right upper lobe lesion. She has no acute new symptoms. No significant change in breathing/chest plain/weight loss/night sweats/fevers/hemoptysis/new cough. CT showed no significant adenopathy. These findings were discussed with the patient in detail and she is agreeable to further evaluation with a PET/CT scan.  She has been enjoying life and notes that she has a visit of the beach planned soon. Has not been smoking cigarettes. Does not require oxygen for her COPD.   MEDICAL HISTORY:  Past Medical History  Diagnosis Date  . Arthritis   . COPD (chronic obstructive pulmonary disease)   . Emphysema of lung   . Hyperlipidemia   . PONV (postoperative nausea and vomiting)     pt "could not eat during hospital stay"  . Shortness of breath   . Wears glasses   . lung ca 05/2003  . Depression   . Reflux esophagitis   . Lung cancer     s/p LUL lobectomy  . Hyperlipidemia   . Osteoarthritis       spine, knees, hands  . Rhinitis     SURGICAL HISTORY: Past Surgical History  Procedure Laterality Date  . Lung lobectomy Left 4/05  . Sinus surgery with instatrak    . Tubal ligation    . Dilation and curettage of uterus    . Vocal cord lateralization, endoscopic approach w/ mlb      nodule removed  . Knee surgery      rt knee torn cartilage  . Cystoscopy  12/10    for evaluation of hematuria  . Breast surgery  1992    lt br bx  . Eye surgery      catataracts-both  . Retinal detachment surgery  2011    left  . Cholecystectomy  11/11/2011    Procedure: LAPAROSCOPIC CHOLECYSTECTOMY WITH INTRAOPERATIVE CHOLANGIOGRAM;  Surgeon: Odis Hollingshead, MD;  Location: [facility];  Service: General;  Laterality: N/A;  . Breast lumpectomy with needle localization Right 04/11/2013    Procedure: BREAST LUMPECTOMY WITH NEEDLE LOCALIZATION;  Surgeon: Odis Hollingshead, MD;  Location: [facility];  Service: General;  Laterality: Right;    SOCIAL HISTORY: Social History   Social History  . Marital Status: Married    Spouse Name: N/A  . Number of Children: 2  . Years of Education: N/A   Occupational History  . Not on file.   Social History Main Topics  . Smoking status: Former Smoker -- 2.00 packs/day for 40 years    Quit date: 09/21/2008  .  Smokeless tobacco: Not on file  . Alcohol Use: Yes     Comment: SOCIALLY  . Drug Use: No  . Sexual Activity: No   Other Topics Concern  . Not on file   Social History Narrative    FAMILY HISTORY: Family History  Problem Relation Age of Onset  . Cancer Mother 52    ovarian cancer  . Hypertension Mother   . Diabetes Mother   . Alcohol abuse Father     ALLERGIES:  is allergic to adhesive; amoxicillin; septra; and oxycodone.  MEDICATIONS:  Current Outpatient Prescriptions  Medication Sig Dispense Refill  . acetaminophen (TYLENOL) 325 MG tablet Take 650 mg by mouth every 6 (six) hours as needed.    Marland Kitchen albuterol (PROVENTIL  HFA;VENTOLIN HFA) 108 (90 BASE) MCG/ACT inhaler Inhale 2 puffs into the lungs every 6 (six) hours as needed for wheezing or shortness of breath.    . ALPRAZolam (XANAX) 0.5 MG tablet Take 0.5 mg by mouth 3 (three) times daily as needed. For anxiety    . cetirizine (ZYRTEC) 10 MG tablet Take 10 mg by mouth daily.    . cholecalciferol (VITAMIN D) 1000 UNITS tablet Take 1,000 Units by mouth daily.    . CYMBALTA 60 MG capsule Take 60 mg by mouth daily.     . fish oil-omega-3 fatty acids 1000 MG capsule Take 1 g by mouth 3 (three) times daily.     . Fluticasone Furoate-Vilanterol (BREO ELLIPTA) 100-25 MCG/INH AEPB Inhale into the lungs.    . Glucosamine Sulfate 750 MG TABS Take 750 mg by mouth 2 (two) times daily.    [name] 2 MG TABS Take 2 mg by mouth at bedtime.     . Magnesium 500 MG CAPS Take 500 mg by mouth daily.     . metroNIDAZOLE (METROGEL) 0.75 % vaginal gel Place 1 Applicatorful vaginally at bedtime. 70 g 0  . Misc Natural Products (TART CHERRY ADVANCED PO) Take 1 tablet by mouth 3 (three) times daily.     . Multiple Vitamins-Minerals (MULTIVITAMIN WITH MINERALS) tablet Take 1 tablet by mouth daily.    . promethazine (PHENERGAN) 25 MG tablet as needed.  0  . pseudoephedrine (SUDAFED) 30 MG tablet Take 30 mg by mouth every 4 (four) hours as needed for congestion.     No current facility-administered medications for this visit.    REVIEW OF SYSTEMS:    10 Point review of Systems was done is negative except as noted above.  PHYSICAL EXAMINATION: ECOG PERFORMANCE STATUS: 1 - Symptomatic but completely ambulatory  . Filed Vitals:   11/21/14 1505  BP: 99/60  Pulse: 80  Temp: 98.5 F (36.9 C)  Resp: 18   Filed Weights   11/21/14 1505  Weight: 143 lb 6.4 oz (65.046 kg)   .Body mass index is 23.16 kg/(m^2).  GENERAL:alert, in no acute distress and comfortable SKIN: skin color, texture, turgor are normal, no rashes or significant lesions EYES: normal, conjunctiva are pink  and non-injected, sclera clear OROPHARYNX:no exudate, no erythema and lips, buccal mucosa, and tongue normal  NECK: supple, no JVD, thyroid normal size, non-tender, without nodularity LYMPH:  no palpable lymphadenopathy in the cervical, axillary or inguinal LUNGS: Decreased entry bilaterally with scattered rhonchi no rales  HEART: regular rate & rhythm,  no murmurs and no lower extremity edema ABDOMEN: abdomen soft, non-tender, normoactive bowel sounds  Musculoskeletal: no cyanosis of digits and no clubbing  PSYCH: alert & oriented x 3 with fluent speech NEURO: no  focal motor/sensory deficits  LABORATORY DATA:  I have reviewed the data as listed  . CBC Latest Ref Rng 11/20/2014 11/24/2013 04/11/2013  WBC 3.9 - 10.3 10e3/uL 5.5 5.7 -  Hemoglobin 11.6 - 15.9 g/dL 15.3 16.0(H) 11.2(L)  Hematocrit 34.8 - 46.6 % 45.5 49.0(H) -  Platelets 145 - 400 10e3/uL 262 265 -    . CMP Latest Ref Rng 11/20/2014 11/24/2013 04/06/2013  Glucose 70 - 140 mg/dl 89 81 114(H)  BUN 7.0 - 26.0 mg/dL 10.2 12.7 12  Creatinine 0.6 - 1.1 mg/dL 0.7 0.8 0.63  Sodium 136 - 145 mEq/L 143 144 144  Potassium 3.5 - 5.1 mEq/L 3.9 3.9 4.3  Chloride 96 - 112 mEq/L - - 103  CO2 22 - 29 mEq/L '28 27 28  '$ Calcium 8.4 - 10.4 mg/dL 9.2 9.6 9.2  Total Protein 6.4 - 8.3 g/dL 6.6 7.0 6.9  Total Bilirubin 0.20 - 1.20 mg/dL 0.87 0.74 0.8  Alkaline Phos 40 - 150 U/L 78 86 62  AST 5 - 34 U/L '16 16 23  '$ ALT 0 - 55 U/L '17 19 19     '$ RADIOGRAPHIC STUDIES: I have personally reviewed the radiological images as listed and agreed with the findings in the report. Ct Chest Wo Contrast  11/20/2014  CLINICAL DATA:  Former smoker. History of malignant neoplasm of right upper lobe. EXAM: CT CHEST WITHOUT CONTRAST TECHNIQUE: Multidetector CT imaging of the chest was performed following the standard protocol without IV contrast. COMPARISON:  12/08/2012 FINDINGS: Mediastinum: The heart size appears normal. Aortic atherosclerosis identified. The  trachea appears patent and is midline. Normal appearance of the esophagus. No enlarged mediastinal or hilar lymph nodes. No axillary or supraclavicular adenopathy. Lungs/Pleura: No pleural effusion. Moderate to advanced changes of centrilobular and paraseptal emphysema identified. There is a new opacity within the right upper lobe measuring 2 cm, image 17 of series 5. Upper Abdomen: Previous cholecystectomy. The adrenal glands appear normal. Aortic atherosclerosis noted. The visualized portions of the spleen appear normal. Musculoskeletal: There is multi level degenerative disc disease within the thoracic spine. No aggressive lytic or sclerotic bone lesions. Chronic deformity involving the body of sternum is identified. No aggressive lytic or sclerotic bone lesions. IMPRESSION: 1. New right upper lobe nodular opacity is identified measuring 2 cm.If there are no signs or symptoms of inflammation or infection then PET-CT is advised with subsequent tissue sampling if indicated. If there are signs or symptoms of pneumonia then a followup examination after appropriate antibiotic therapy is recommended to confirm resolution. If this does not resolve then PET-CT is recommended. 2. Emphysema 3. Aortic atherosclerosis Electronically Signed   By: Kerby Moors M.D.   On: 11/20/2014 10:13    ASSESSMENT & PLAN:   64 year old Caucasian female with history of COPD and prior history of left upper lobe stage IA non-small cell lung cancer status post left upper lobectomy here for her annual follow-up. CT showed evidence of 2 cm right upper lobe opacity.  #1 history of stage IA left upper lobe non-small cell lung cancer status post left upper lobectomy in 2005. #2 new right upper lobe lung opacity #3 history of COPD not on home oxygen #4 ex-smoker quit in 2010. An 80-pack-year history of smoking. #5 atypical ductal hyperplasia Plan -Given the fact that the patient does not have any overt signs of pneumonia or COPD  exacerbation we will evaluate this right upper lobe opacity further with a PET/CT scan. -PET/CT scan ordered further evaluate this nodule. -PFTs requested to  determine baseline lung function in case further intervention is necessary. -The pulmonary nodule is not discrete and given her significant emphysema might be high risk to perform a transthoracic needle biopsy. -We will refer the patient to cardiothoracic surgery for their input as well. -If the nodule is PET positive we will need to determine if it looks definitely neoplastic to consider surgery for diagnosis and treatment in the presence of localized disease versus SBRT versus consideration of CT-guided lung mass biopsy. -Part of the decision also hinges on the patient's pulmonary function testing to determine if she has a lung capacity tolerate an additional lobectomy in the setting of significant emphysema and prior left upper lobectomy. -If the nodule is indeterminate might consult pulmonary for their input in addition to the thoracic surgeon's input to determine appropriate follow-up plan.  Return to care in 2 weeks with PET/CT scan results, PFTs and cardiothoracic surgeon input.  All the patient's questions answered to her apparent satisfaction.  I spent 35 minutes counseling the patient face to face. The total time spent in the appointment was 40 minutes and more than 50% was on counseling and direct patient cares.    Sullivan Lone MD [place] AAHIVMS [facility] Hematology/Oncology Physician [facility]  (Office):       [phone] (Work cell):  [phone] (Fax):           [phone]  11/21/2014 3:06 PM

## 2014-11-21 NOTE — Telephone Encounter (Signed)
Gave and printed oct and nov appt MD to put in referral to Cardiac thoracic.Dr. Roxan Hockey ..the patient sched for pft on 10.25 @ 2..the patient ok and aware

## 2014-11-24 ENCOUNTER — Other Ambulatory Visit: Payer: Self-pay | Admitting: Hematology

## 2014-11-24 DIAGNOSIS — C3410 Malignant neoplasm of upper lobe, unspecified bronchus or lung: Secondary | ICD-10-CM

## 2014-11-27 ENCOUNTER — Ambulatory Visit: Payer: 59 | Admitting: Hematology

## 2014-11-28 ENCOUNTER — Ambulatory Visit (HOSPITAL_COMMUNITY)
Admission: RE | Admit: 2014-11-28 | Discharge: 2014-11-28 | Disposition: A | Discharge status: Home / Self Care | Payer: 59 | Source: Ambulatory Visit | Attending: Hematology | Admitting: Hematology

## 2014-11-28 DIAGNOSIS — F17210 Nicotine dependence, cigarettes, uncomplicated: Secondary | ICD-10-CM | POA: Insufficient documentation

## 2014-11-28 DIAGNOSIS — R911 Solitary pulmonary nodule: Secondary | ICD-10-CM

## 2014-11-28 DIAGNOSIS — R0609 Other forms of dyspnea: Secondary | ICD-10-CM | POA: Diagnosis not present

## 2014-11-28 LAB — PULMONARY FUNCTION TEST
DL/VA % PRED: 44 %
DL/VA: 2.22 ml/min/mmHg/L
DLCO COR % PRED: 40 %
DLCO COR: 10.64 ml/min/mmHg
DLCO unc % pred: 43 %
DLCO unc: 11.35 ml/min/mmHg
FEF 25-75 POST: 0.8 L/s
FEF 25-75 Pre: 0.68 L/sec
FEF2575-%CHANGE-POST: 17 %
FEF2575-%Pred-Post: 35 %
FEF2575-%Pred-Pre: 30 %
FEV1-%CHANGE-POST: 2 %
FEV1-%Pred-Post: 72 %
FEV1-%Pred-Pre: 70 %
FEV1-POST: 1.87 L
FEV1-Pre: 1.82 L
FEV1FVC-%CHANGE-POST: 0 %
FEV1FVC-%PRED-PRE: 65 %
FEV6-%Change-Post: 4 %
FEV6-%PRED-PRE: 100 %
FEV6-%Pred-Post: 105 %
FEV6-POST: 3.4 L
FEV6-Pre: 3.24 L
FEV6FVC-%Change-Post: 2 %
FEV6FVC-%PRED-POST: 96 %
FEV6FVC-%Pred-Pre: 93 %
FVC-%CHANGE-POST: 2 %
FVC-%PRED-POST: 109 %
FVC-%PRED-PRE: 107 %
FVC-POST: 3.68 L
FVC-PRE: 3.59 L
POST FEV6/FVC RATIO: 92 %
PRE FEV6/FVC RATIO: 90 %
Post FEV1/FVC ratio: 51 %
Pre FEV1/FVC ratio: 51 %
RV % pred: 125 %
RV: 2.71 L
TLC % PRED: 121 %
TLC: 6.43 L

## 2014-11-28 MED ORDER — ALBUTEROL SULFATE (2.5 MG/3ML) 0.083% IN NEBU
2.5000 mg | INHALATION_SOLUTION | Freq: Once | RESPIRATORY_TRACT | Status: AC
  Administered 2014-11-28: 2.5 mg via RESPIRATORY_TRACT

## 2014-12-04 ENCOUNTER — Encounter (HOSPITAL_COMMUNITY)
Admission: RE | Admit: 2014-12-04 | Discharge: 2014-12-04 | Disposition: A | Discharge status: Home / Self Care | Payer: 59 | Source: Ambulatory Visit | Attending: Hematology | Admitting: Hematology

## 2014-12-04 DIAGNOSIS — R911 Solitary pulmonary nodule: Secondary | ICD-10-CM

## 2014-12-04 LAB — GLUCOSE, CAPILLARY: Glucose-Capillary: 96 mg/dL (ref 65–99)

## 2014-12-04 MED ORDER — FLUDEOXYGLUCOSE F - 18 (FDG) INJECTION
7.0700 | Freq: Once | INTRAVENOUS | Status: DC | PRN
  Administered 2014-12-04: 7.07 via INTRAVENOUS
  Filled 2014-12-04: qty 7.07

## 2014-12-07 ENCOUNTER — Telehealth: Payer: Self-pay | Admitting: Hematology

## 2014-12-07 ENCOUNTER — Ambulatory Visit (HOSPITAL_BASED_OUTPATIENT_CLINIC_OR_DEPARTMENT_OTHER): Payer: 59 | Admitting: Hematology

## 2014-12-07 ENCOUNTER — Encounter: Payer: Self-pay | Admitting: Hematology

## 2014-12-07 VITALS — BP 110/41 | HR 89 | Temp 98.4°F | Resp 18 | Ht 66.0 in | Wt 145.1 lb

## 2014-12-07 DIAGNOSIS — J189 Pneumonia, unspecified organism: Secondary | ICD-10-CM | POA: Diagnosis not present

## 2014-12-07 DIAGNOSIS — J449 Chronic obstructive pulmonary disease, unspecified: Secondary | ICD-10-CM | POA: Diagnosis not present

## 2014-12-07 DIAGNOSIS — Z85118 Personal history of other malignant neoplasm of bronchus and lung: Secondary | ICD-10-CM | POA: Diagnosis not present

## 2014-12-07 DIAGNOSIS — R911 Solitary pulmonary nodule: Secondary | ICD-10-CM

## 2014-12-07 MED ORDER — PREDNISONE 20 MG PO TABS: 40.0000 mg | ORAL_TABLET | Freq: Every day | ORAL | Status: DC

## 2014-12-07 MED ORDER — LEVOFLOXACIN 750 MG PO TABS: 750.0000 mg | ORAL_TABLET | Freq: Every day | ORAL | Status: DC

## 2014-12-07 NOTE — Telephone Encounter (Signed)
Gave and printed appt sched and avs fo rpt for NOV adn DEC pt sched to see Dr. Melvyn Novas on 11.21 @ 11:45

## 2014-12-08 NOTE — Progress Notes (Signed)
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  INTERVAL HISTORY:  Ms Klos is here for f/u of her right upper lobe lung nodule and the results of her recent PET/CT scan. As per the radiologist the right upper lobe lung lesion increase a little bit in size from 2-2.9 cm but has an atypical appearance to be a neoplastic nodule and appears more consistent to be inflammation/infection. The results were discussed in detail with the patient. She is agreeable with treating this with antibiotics and prednisone for a week and doing in interval follow-up CT scan in 4 weeks. We also discussed getting an opinion from the pulmonologist regarding their input on the images and also for optimization of her COPD management.  MEDICAL HISTORY:  Past Medical History  Diagnosis Date  . Arthritis   . COPD (chronic obstructive pulmonary disease) ([place])   . Emphysema of lung ([place])   . Hyperlipidemia   . PONV (postoperative nausea and vomiting)     pt "could not eat during hospital stay"  . Shortness of breath   . Wears glasses   . lung ca 05/2003  . Depression   . Reflux esophagitis   . Lung cancer ([place])     s/p LUL lobectomy  . Hyperlipidemia   . Osteoarthritis     spine, knees, hands  . Rhinitis     SURGICAL HISTORY: Past Surgical History  Procedure Laterality Date  . Lung lobectomy Left 4/05  . Sinus surgery with instatrak    . Tubal ligation    . Dilation and curettage of uterus    . Vocal cord lateralization, endoscopic  approach w/ mlb      nodule removed  . Knee surgery      rt knee torn cartilage  . Cystoscopy  12/10    for evaluation of hematuria  . Breast surgery  1992    lt br bx  . Eye surgery      catataracts-both  . Retinal detachment surgery  2011    left  . Cholecystectomy  11/11/2011    Procedure: LAPAROSCOPIC CHOLECYSTECTOMY WITH INTRAOPERATIVE CHOLANGIOGRAM;  Surgeon: Odis Hollingshead, MD;  Location: [facility] [place];  Service: General;  Laterality: N/A;  . Breast lumpectomy with needle localization Right 04/11/2013    Procedure: BREAST LUMPECTOMY WITH NEEDLE LOCALIZATION;  Surgeon: Odis Hollingshead, MD;  Location: [place];  Service: General;  Laterality: Right;    SOCIAL HISTORY: Social History   Social History  . Marital Status: Married    Spouse Name: N/A  . Number of Children: 2  . Years of Education: N/A   Occupational History  . Not on file.   Social History Main Topics  . Smoking status: Former Smoker -- 2.00 packs/day for 40 years    Quit date: 09/21/2008  . Smokeless tobacco: Not on file  . Alcohol Use: Yes     Comment: SOCIALLY  . Drug Use: No  . Sexual Activity: No   Other Topics Concern  . Not on file   Social History Narrative  FAMILY HISTORY: Family History  Problem Relation Age of Onset  . Cancer Mother 20    ovarian cancer  . Hypertension Mother   . Diabetes Mother   . Alcohol abuse Father     ALLERGIES:  is allergic to adhesive; amoxicillin; septra; and oxycodone.  MEDICATIONS:  Current Outpatient Prescriptions  Medication Sig Dispense Refill  . acetaminophen (TYLENOL) 325 MG tablet Take 650 mg by mouth every 6 (six) hours as needed.    Marland Kitchen albuterol (PROVENTIL HFA;VENTOLIN HFA) 108 (90 BASE) MCG/ACT inhaler Inhale 2 puffs into the lungs every 6 (six) hours as needed for wheezing or shortness of breath.    . ALPRAZolam (XANAX) 0.5 MG tablet Take 0.5 mg by mouth 3 (three) times daily as needed. For anxiety    . cetirizine (ZYRTEC) 10  MG tablet Take 10 mg by mouth daily.    . cholecalciferol (VITAMIN D) 1000 UNITS tablet Take 1,000 Units by mouth daily.    . CYMBALTA 60 MG capsule Take 60 mg by mouth daily.     . fish oil-omega-3 fatty acids 1000 MG capsule Take 1 g by mouth 3 (three) times daily.     . Fluticasone Furoate-Vilanterol (BREO ELLIPTA) 100-25 MCG/INH AEPB Inhale into the lungs.    . Glucosamine Sulfate 750 MG TABS Take 750 mg by mouth 2 (two) times daily.    Marland Kitchen levofloxacin (LEVAQUIN) 750 MG tablet Take 1 tablet (750 mg total) by mouth daily. 7 tablet 0  . LUNESTA 2 MG TABS Take 2 mg by mouth at bedtime.     . Magnesium 500 MG CAPS Take 500 mg by mouth daily.     . metroNIDAZOLE (METROGEL) 0.75 % vaginal gel Place 1 Applicatorful vaginally at bedtime. (Patient not taking: Reported on 12/07/2014) 70 g 0  . Misc Natural Products (TART CHERRY ADVANCED PO) Take 1 tablet by mouth 3 (three) times daily.     . Multiple Vitamins-Minerals (MULTIVITAMIN WITH MINERALS) tablet Take 1 tablet by mouth daily.    . predniSONE (DELTASONE) 20 MG tablet Take 2 tablets (40 mg total) by mouth daily with breakfast. 14 tablet 0  . promethazine (PHENERGAN) 25 MG tablet as needed.  0  . pseudoephedrine (SUDAFED) 30 MG tablet Take 30 mg by mouth every 4 (four) hours as needed for congestion.     No current facility-administered medications for this visit.   Facility-Administered Medications Ordered in Other Visits  Medication Dose Route Frequency Provider Last Rate Last Dose  . fludeoxyglucose F - 18 (FDG) injection 7.07 milli Curie  7.07 milli Curie Intravenous Once PRN Brunetta Genera, MD   7.07 milli Curie at 12/04/14 1053    REVIEW OF SYSTEMS:    10 Point review of Systems was done is negative except as noted above.  PHYSICAL EXAMINATION: ECOG PERFORMANCE STATUS: 1 - Symptomatic but completely ambulatory  . Filed Vitals:   12/07/14 1347  BP: 110/41  Pulse: 89  Temp: 98.4 F (36.9 C)  Resp: 18   Filed Weights    12/07/14 1347  Weight: 145 lb 1.6 oz (65.817 kg)   .Body mass index is 23.43 kg/(m^2).  GENERAL:alert, in no acute distress and comfortable SKIN: skin color, texture, turgor are normal, no rashes or significant lesions EYES: normal, conjunctiva are pink and non-injected, sclera clear OROPHARYNX:no exudate, no erythema and lips, buccal mucosa, and tongue normal  NECK: supple, no JVD, thyroid normal size, non-tender, without nodularity LYMPH:  no palpable lymphadenopathy in the cervical, axillary or inguinal LUNGS:  Decreased entry bilaterally with scattered rhonchi no rales  HEART: regular rate & rhythm,  no murmurs and no lower extremity edema ABDOMEN: abdomen soft, non-tender, normoactive bowel sounds  Musculoskeletal: no cyanosis of digits and no clubbing  PSYCH: alert & oriented x 3 with fluent speech NEURO: no focal motor/sensory deficits  LABORATORY DATA:  I have reviewed the data as listed  . CBC Latest Ref Rng 11/20/2014 11/24/2013 04/11/2013  WBC 3.9 - 10.3 10e3/uL 5.5 5.7 -  Hemoglobin 11.6 - 15.9 g/dL 15.3 16.0(H) 11.2(L)  Hematocrit 34.8 - 46.6 % 45.5 49.0(H) -  Platelets 145 - 400 10e3/uL 262 265 -    . CMP Latest Ref Rng 11/20/2014 11/24/2013 04/06/2013  Glucose 70 - 140 mg/dl 89 81 114(H)  BUN 7.0 - 26.0 mg/dL 10.2 12.7 12  Creatinine 0.6 - 1.1 mg/dL 0.7 0.8 0.63  Sodium 136 - 145 mEq/L 143 144 144  Potassium 3.5 - 5.1 mEq/L 3.9 3.9 4.3  Chloride 96 - 112 mEq/L - - 103  CO2 22 - 29 mEq/L '28 27 28  '$ Calcium 8.4 - 10.4 mg/dL 9.2 9.6 9.2  Total Protein 6.4 - 8.3 g/dL 6.6 7.0 6.9  Total Bilirubin 0.20 - 1.20 mg/dL 0.87 0.74 0.8  Alkaline Phos 40 - 150 U/L 78 86 62  AST 5 - 34 U/L '16 16 23  '$ ALT 0 - 55 U/L '17 19 19     '$ RADIOGRAPHIC STUDIES: I have personally reviewed the radiological images as listed and agreed with the findings in the report. Ct Chest Wo Contrast  11/20/2014  CLINICAL DATA:  Former smoker. History of malignant neoplasm of right upper lobe. EXAM:  CT CHEST WITHOUT CONTRAST TECHNIQUE: Multidetector CT imaging of the chest was performed following the standard protocol without IV contrast. COMPARISON:  12/08/2012 FINDINGS: Mediastinum: The heart size appears normal. Aortic atherosclerosis identified. The trachea appears patent and is midline. Normal appearance of the esophagus. No enlarged mediastinal or hilar lymph nodes. No axillary or supraclavicular adenopathy. Lungs/Pleura: No pleural effusion. Moderate to advanced changes of centrilobular and paraseptal emphysema identified. There is a new opacity within the right upper lobe measuring 2 cm, image 17 of series 5. Upper Abdomen: Previous cholecystectomy. The adrenal glands appear normal. Aortic atherosclerosis noted. The visualized portions of the spleen appear normal. Musculoskeletal: There is multi level degenerative disc disease within the thoracic spine. No aggressive lytic or sclerotic bone lesions. Chronic deformity involving the body of sternum is identified. No aggressive lytic or sclerotic bone lesions. IMPRESSION: 1. New right upper lobe nodular opacity is identified measuring 2 cm.If there are no signs or symptoms of inflammation or infection then PET-CT is advised with subsequent tissue sampling if indicated. If there are signs or symptoms of pneumonia then a followup examination after appropriate antibiotic therapy is recommended to confirm resolution. If this does not resolve then PET-CT is recommended. 2. Emphysema 3. Aortic atherosclerosis Electronically Signed   By: Kerby Moors M.D.   On: 11/20/2014 10:13   Nm Pet Image Initial (pi) Skull Base To Thigh  12/04/2014  CLINICAL DATA:  Initial treatment strategy for right upper lobe lung mass concerning for lung cancer. EXAM: NUCLEAR MEDICINE PET SKULL BASE TO THIGH TECHNIQUE: 7.1 MCi F-18 FDG was injected intravenously. Full-ring PET imaging was performed from the skull base to thigh after the radiotracer. CT data was obtained and used  for attenuation correction and anatomic localization. FASTING BLOOD GLUCOSE:  Value: 96 mg/dl COMPARISON:  Chest CT 11/20/2014.  PET-CT 05/11/2003. FINDINGS:  NECK No hypermetabolic lymph nodes in the neck. Hypermetabolic activity in the region of the vocal cords, without evidence of discrete mass, presumably physiologic. CHEST Again noted is an ill-defined nodular density in the right upper lobe, which appears slightly larger than the prior examination, but predominantly is manifest by a ground-glass attenuation and focal architectural distortion. This lesion is irregular in shape and therefore difficult, to discretely measure but is estimated to be approximately 2.9 x 2.1 cm (image 62 of series 5), and has some peripheral areas adjacent to it which are also slightly nodular in appearance (image 63 of series 5), in the midst of some peripheral bronchiolectasis. Overall, this lesion appears slightly larger than the very recent CT scan from 11/20/2014, and although it demonstrates internal hypermetabolism (SUVmax = 5.1 centrally and 2.7 peripherally), this is strongly favored to be infectious or inflammatory in etiology. No hypermetabolic mediastinal or hilar nodes. Status post left upper lobectomy. There is a background of severe centrilobular and mild paraseptal emphysema. Small calcified granuloma in the right upper lobe is unchanged. Heart size is normal. There is no significant pericardial fluid, thickening or pericardial calcification. There is atherosclerosis of the thoracic aorta, the great vessels of the mediastinum and the coronary arteries, including calcified atherosclerotic plaque in the left anterior descending and right coronary arteries. Several small calcified mediastinal and left hilar lymph nodes are noted. Esophagus is unremarkable in appearance. ABDOMEN/PELVIS No abnormal hypermetabolic activity within the liver, pancreas, adrenal glands, or spleen. No hypermetabolic lymph nodes in the abdomen or  pelvis. Status post cholecystectomy. 7 mm low attenuation lesion in segment 4A of the liver is incompletely characterized, but demonstrates no hypermetabolism and is similar to the prior examination, likely a tiny cyst. 12 mm low-attenuation (-10 HU) left adrenal nodule, compatible with an adrenal adenoma. No significant volume of ascites. No pneumoperitoneum. No pathologic distention of small bowel. Uterus and ovaries are unremarkable in appearance. SKELETON No focal hypermetabolic activity to suggest skeletal metastasis. IMPRESSION: 1. The lesion of concern in the upper pole of the right kidney has slightly increased in size compared to the recent prior examination and demonstrates hypermetabolism both centrally and peripherally. These imaging features are most suggestive of infection/inflammation. An empiric trial of antibiotics is suggested, followed by repeat noncontrast CT of the chest in 3-4 weeks to ensure the resolution of this finding. Although neoplasm can have this appearance, typically the appearance would be seen in the setting of adenocarcinoma which is a very slow-growing lesion that tends to be only minimally hypermetabolic (which is not characteristic of the behavior of this lesion at this time). 2. Severe centrilobular emphysema. 3. Status post left upper lobectomy. 4. Atherosclerosis, including 2 vessel coronary artery disease. Please note that although the presence of coronary artery calcium documents the presence of coronary artery disease, the severity of this disease and any potential stenosis cannot be assessed on this non-gated CT examination. Assessment for potential risk factor modification, dietary therapy or pharmacologic therapy may be warranted, if clinically indicated. 5. Additional incidental findings, as above. Electronically Signed   By: Vinnie Langton M.D.   On: 12/04/2014 13:52    ASSESSMENT & PLAN:   64 year old Caucasian female with history of COPD and prior history of  left upper lobe stage IA non-small cell lung cancer status post left upper lobectomy here for her annual follow-up. CT showed evidence of 2 cm right upper lobe opacity. PET/CT scan did not showing definite evidence of a neoplastic nodule and cannot rule out the  presence of inflammation/infection causing uneven FDG avidity.  #1 history of stage IA left upper lobe non-small cell lung cancer status post left upper lobectomy in 2005. #2 new right upper lobe lung opacity #3 history of COPD not on home oxygen #4 ex-smoker quit in 2010. An 80-pack-year history of smoking. #5 atypical ductal hyperplasia Plan -We'll treat with levofloxacin and prednisone for 1 week to treat any possible infection or inflammation. -Repeat CT chest without contrast in 4 weeks to check for resolution of this right upper lobe opacity. -We'll place referral for evaluation by pulmonologist to review her images and also to help determine further follow-up of this indeterminate nodule and also to set up cares for ongoing management of her significant emphysema. -Further management based on repeat CT scan in 4 weeks.  -If still indeterminate would likely need to present her case in the thoracic tumor Board for multidisciplinary input. -Still elevating cardiothoracic surgery appointment. -PFTs reviewed DLCO about 40%  Return to care in 4 weeks with CT w/o contrast.  All the patient's questions answered to her apparent satisfaction.  I spent 15 minutes counseling the patient face to face. The total time spent in the appointment was 20 minutes and more than 50% was on counseling and direct patient cares.    Sullivan Lone MD [place] AAHIVMS [facility] Hematology/Oncology Physician [place]  (Office):       [phone] (Work cell):  [phone] (Fax):           [phone]

## 2014-12-11 ENCOUNTER — Encounter: Payer: Self-pay | Admitting: Radiation Oncology

## 2014-12-11 ENCOUNTER — Ambulatory Visit
Admission: RE | Admit: 2014-12-11 | Discharge: 2014-12-11 | Disposition: A | Discharge status: Home / Self Care | Payer: 59 | Source: Ambulatory Visit | Attending: Radiation Oncology | Admitting: Radiation Oncology

## 2014-12-11 VITALS — BP 112/48 | HR 81 | Resp 16 | Wt 145.6 lb

## 2014-12-11 DIAGNOSIS — E7800 Pure hypercholesterolemia, unspecified: Secondary | ICD-10-CM | POA: Diagnosis not present

## 2014-12-11 DIAGNOSIS — Z902 Acquired absence of lung [part of]: Secondary | ICD-10-CM | POA: Insufficient documentation

## 2014-12-11 DIAGNOSIS — J984 Other disorders of lung: Secondary | ICD-10-CM | POA: Insufficient documentation

## 2014-12-11 DIAGNOSIS — Z85118 Personal history of other malignant neoplasm of bronchus and lung: Secondary | ICD-10-CM | POA: Insufficient documentation

## 2014-12-11 DIAGNOSIS — E785 Hyperlipidemia, unspecified: Secondary | ICD-10-CM | POA: Insufficient documentation

## 2014-12-11 DIAGNOSIS — K219 Gastro-esophageal reflux disease without esophagitis: Secondary | ICD-10-CM | POA: Diagnosis not present

## 2014-12-11 DIAGNOSIS — F1010 Alcohol abuse, uncomplicated: Secondary | ICD-10-CM | POA: Diagnosis not present

## 2014-12-11 DIAGNOSIS — R911 Solitary pulmonary nodule: Secondary | ICD-10-CM

## 2014-12-11 DIAGNOSIS — Z87891 Personal history of nicotine dependence: Secondary | ICD-10-CM | POA: Diagnosis not present

## 2014-12-11 DIAGNOSIS — C3412 Malignant neoplasm of upper lobe, left bronchus or lung: Secondary | ICD-10-CM

## 2014-12-11 DIAGNOSIS — J449 Chronic obstructive pulmonary disease, unspecified: Secondary | ICD-10-CM | POA: Insufficient documentation

## 2014-12-11 NOTE — Progress Notes (Signed)
Thoracic Location of Tumor / Histology: moderately differentiated adenocarcinoma left upper lobe lung  Patient presented 11/20/14 for annual CT chest.      Tobacco/Marijuana/Snuff/ETOH use: Former smoker   Past/Anticipated interventions by cardiothoracic surgery, if any: left lung lobectomy 05/2003  Past/Anticipated interventions by medical oncology, if any: active surveillance  Signs/Symptoms  Weight changes, if any: No  Respiratory complaints, if any: SOB with great exertion like carrying her grandchild long distances or pushing a    Wheel barrel.   Hemoptysis, if any: No  Pain issues, if any:  Related to effects of arthritis; reports pain is worst in her hands and low back  SAFETY ISSUES:  Prior radiation? No   Pacemaker/ICD?  No  Possible current pregnancy?no  Is the patient on methotrexate? no  Current Complaints / other details:  64 year old female. Married with two children.  Right upper lobe lung lesion increase a little bit in size from 2-2.9 cm but has an atypical appearance to be a neoplastic nodule and appears more consistent to be inflammation/infection. The results were discussed in detail with the patient. She is agreeable with treating this with antibiotics and prednisone for a week and doing in interval follow-up CT scan in 4 weeks.  Taking Levaquin once per day and prednisone 40 mg once per day. Reports she has four more Levaquin pills to take before she completes the course.

## 2014-12-11 NOTE — Progress Notes (Signed)
Radiation Oncology         [phone] ________________________________  Initial outpatient Consultation  Name: Tammy Eaton MRN: [id number]  Date: 12/11/2014  DOB: Jun 07, 1950  NU:UVOZD,GUYQI RUTH, MD  Brunetta Genera, MD   REFERRING PHYSICIAN: Brunetta Genera, MD  DIAGNOSIS: 64 yo woman with an enlarging hypermetabolic right upper lung nodule and history of left upper lung cancer.    ICD-9-CM ICD-10-CM   1. Lesion of lung 518.89 J98.4   2. Primary cancer of left upper lobe of lung (HCC) 162.3 C34.12   3. Nodule of right lung 793.11 R91.1     HISTORY OF PRESENT ILLNESS::Tammy Eaton is a 64 y.o. 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 Patient had a follow up with Dr. Ralene Ok and then seen by Dr. Lona Kettle and now Dr.Kale. Annual CT of the chest on 11/20/14 has showed a right upper lobe lung lesion that has increased in size from 2 to 2.9 cm but has an atypical appearance to be a neoplastic nodule and appears more consistent to be inflammation/infection. The results were discussed in detail with the patient. She is agreeable with treating this with antibiotics and prednisone for a week and doing in interval follow-up CT scan in 4 weeks.   The patient denies SOB,cough or other pains. Not using supplemental oxygen. Smoked for 50 years , quit since 2010.  Patient will see Dr. Melvyn Novas in pulmonology on 12/25/14.   PREVIOUS RADIATION THERAPY: No  PAST MEDICAL HISTORY:  has a past medical history of Arthritis; COPD (chronic obstructive pulmonary disease) ([place]); Emphysema of lung ([place]); Hyperlipidemia; PONV (postoperative nausea and vomiting); Shortness of breath; Wears glasses; lung ca (05/2003); Depression; Reflux esophagitis; Lung cancer ([place]); Hyperlipidemia; Osteoarthritis; and Rhinitis.    PAST SURGICAL HISTORY: Past Surgical History  Procedure Laterality Date  . Lung lobectomy  Left 4/05  . Sinus surgery with instatrak    . Tubal ligation    . Dilation and curettage of uterus    . Vocal cord lateralization, endoscopic approach w/ mlb      nodule removed  . Knee surgery      rt knee torn cartilage  . Cystoscopy  12/10    for evaluation of hematuria  . Breast surgery  1992    lt br bx  . Eye surgery      catataracts-both  . Retinal detachment surgery  2011    left  . Cholecystectomy  11/11/2011    Procedure: LAPAROSCOPIC CHOLECYSTECTOMY WITH INTRAOPERATIVE CHOLANGIOGRAM;  Surgeon: Odis Hollingshead, MD;  Location: [place];  Service: General;  Laterality: N/A;  . Breast lumpectomy with needle localization Right 04/11/2013    Procedure: BREAST LUMPECTOMY WITH NEEDLE LOCALIZATION;  Surgeon: Odis Hollingshead, MD;  Location: [place];  Service: General;  Laterality: Right;    FAMILY HISTORY: family history includes Alcohol abuse in her father; Cancer (age of onset: 24) in her mother; Diabetes in her mother; Hypertension in her mother.  SOCIAL HISTORY:  Social History   Social History  . Marital Status: Married    Spouse Name: N/A  . Number of Children: 2  . Years of Education: N/A   Occupational History  . Not on file.   Social History Main Topics  . Smoking status: Former Smoker -- 2.00 packs/day for 40 years    Quit date: 09/21/2008  . Smokeless tobacco: Not on file  .  Alcohol Use: Yes     Comment: SOCIALLY  . Drug Use: No  . Sexual Activity: No   Other Topics Concern  . Not on file   Social History Narrative    ALLERGIES: Adhesive; Amoxicillin; Septra; and Oxycodone  MEDICATIONS:  Current Outpatient Prescriptions  Medication Sig Dispense Refill  . acetaminophen (TYLENOL) 325 MG tablet Take 650 mg by mouth every 6 (six) hours as needed.    Marland Kitchen albuterol (PROVENTIL HFA;VENTOLIN HFA) 108 (90 BASE) MCG/ACT inhaler Inhale 2 puffs into the lungs every 6 (six) hours as needed for wheezing or shortness of breath.    . ALPRAZolam  (XANAX) 0.5 MG tablet Take 0.5 mg by mouth 3 (three) times daily as needed. For anxiety    . cetirizine (ZYRTEC) 10 MG tablet Take 10 mg by mouth daily.    . cholecalciferol (VITAMIN D) 1000 UNITS tablet Take 1,000 Units by mouth daily.    . CYMBALTA 60 MG capsule Take 60 mg by mouth daily.     . fish oil-omega-3 fatty acids 1000 MG capsule Take 1 g by mouth 3 (three) times daily.     . Fluticasone Furoate-Vilanterol (BREO ELLIPTA) 100-25 MCG/INH AEPB Inhale into the lungs.    . Glucosamine Sulfate 750 MG TABS Take 750 mg by mouth 2 (two) times daily.    Marland Kitchen levofloxacin (LEVAQUIN) 750 MG tablet Take 1 tablet (750 mg total) by mouth daily. 7 tablet 0  . LUNESTA 2 MG TABS Take 2 mg by mouth at bedtime.     . Magnesium 500 MG CAPS Take 500 mg by mouth daily.     . Multiple Vitamins-Minerals (MULTIVITAMIN WITH MINERALS) tablet Take 1 tablet by mouth daily.    . predniSONE (DELTASONE) 20 MG tablet Take 2 tablets (40 mg total) by mouth daily with breakfast. 14 tablet 0  . metroNIDAZOLE (METROGEL) 0.75 % vaginal gel Place 1 Applicatorful vaginally at bedtime. (Patient not taking: Reported on 12/07/2014) 70 g 0  . Misc Natural Products (TART CHERRY ADVANCED PO) Take 1 tablet by mouth 3 (three) times daily.     . promethazine (PHENERGAN) 25 MG tablet as needed.  0  . pseudoephedrine (SUDAFED) 30 MG tablet Take 30 mg by mouth every 4 (four) hours as needed for congestion.     No current facility-administered medications for this encounter.    REVIEW OF SYSTEMS:  A 15 point review of systems is documented in the electronic medical record. This was obtained by the nursing staff. However, I reviewed this with the patient to discuss relevant findings and make appropriate changes.  Pertinent items are noted in HPI.   PHYSICAL EXAM:  weight is 145 lb 9.6 oz (66.044 kg). Her blood pressure is 112/48 and her pulse is 81. Her respiration is 16 and oxygen saturation is 95%.   Alert and Oriented x3  The patient's  head is normocephalic and atraumatic. Speech is fluent articulate Respiratory effort unremarkable The patient's mood and affect are appropriate  KPS = 90  100 - Normal; no complaints; no evidence of disease. 90   - Able to carry on normal activity; minor signs or symptoms of disease. 80   - Normal activity with effort; some signs or symptoms of disease. 95   - Cares for self; unable to carry on normal activity or to do active work. 60   - Requires occasional assistance, but is able to care for most of his personal needs. 50   - Requires considerable assistance and frequent  medical care. 63   - Disabled; requires special care and assistance. 48   - Severely disabled; hospital admission is indicated although death not imminent. 54   - Very sick; hospital admission necessary; active supportive treatment necessary. 10   - Moribund; fatal processes progressing rapidly. 0     - Dead  Karnofsky DA, Abelmann McGill, Craver LS and [facility] [phone]) The use of the nitrogen mustards in the palliative treatment of carcinoma: with particular reference to bronchogenic carcinoma Cancer 1 634-56  LABORATORY DATA:  Lab Results  Component Value Date   WBC 5.5 11/20/2014   HGB 15.3 11/20/2014   HCT 45.5 11/20/2014   MCV 87.5 11/20/2014   PLT 262 11/20/2014   Lab Results  Component Value Date   NA 143 11/20/2014   K 3.9 11/20/2014   CL 103 04/06/2013   CO2 28 11/20/2014   Lab Results  Component Value Date   ALT 17 11/20/2014   AST 16 11/20/2014   ALKPHOS 78 11/20/2014   BILITOT 0.87 11/20/2014     RADIOGRAPHY: Ct Chest Wo Contrast  11/20/2014  CLINICAL DATA:  Former smoker. History of malignant neoplasm of right upper lobe. EXAM: CT CHEST WITHOUT CONTRAST TECHNIQUE: Multidetector CT imaging of the chest was performed following the standard protocol without IV contrast. COMPARISON:  12/08/2012 FINDINGS: Mediastinum: The heart size appears normal. Aortic atherosclerosis identified. The trachea  appears patent and is midline. Normal appearance of the esophagus. No enlarged mediastinal or hilar lymph nodes. No axillary or supraclavicular adenopathy. Lungs/Pleura: No pleural effusion. Moderate to advanced changes of centrilobular and paraseptal emphysema identified. There is a new opacity within the right upper lobe measuring 2 cm, image 17 of series 5. Upper Abdomen: Previous cholecystectomy. The adrenal glands appear normal. Aortic atherosclerosis noted. The visualized portions of the spleen appear normal. Musculoskeletal: There is multi level degenerative disc disease within the thoracic spine. No aggressive lytic or sclerotic bone lesions. Chronic deformity involving the body of sternum is identified. No aggressive lytic or sclerotic bone lesions. IMPRESSION: 1. New right upper lobe nodular opacity is identified measuring 2 cm.If there are no signs or symptoms of inflammation or infection then PET-CT is advised with subsequent tissue sampling if indicated. If there are signs or symptoms of pneumonia then a followup examination after appropriate antibiotic therapy is recommended to confirm resolution. If this does not resolve then PET-CT is recommended. 2. Emphysema 3. Aortic atherosclerosis Electronically Signed   By: Kerby Moors M.D.   On: 11/20/2014 10:13   Nm Pet Image Initial (pi) Skull Base To Thigh  12/04/2014  CLINICAL DATA:  Initial treatment strategy for right upper lobe lung mass concerning for lung cancer. EXAM: NUCLEAR MEDICINE PET SKULL BASE TO THIGH TECHNIQUE: 7.1 MCi F-18 FDG was injected intravenously. Full-ring PET imaging was performed from the skull base to thigh after the radiotracer. CT data was obtained and used for attenuation correction and anatomic localization. FASTING BLOOD GLUCOSE:  Value: 96 mg/dl COMPARISON:  Chest CT 11/20/2014.  PET-CT 05/11/2003. FINDINGS: NECK No hypermetabolic lymph nodes in the neck. Hypermetabolic activity in the region of the vocal cords,  without evidence of discrete mass, presumably physiologic. CHEST Again noted is an ill-defined nodular density in the right upper lobe, which appears slightly larger than the prior examination, but predominantly is manifest by a ground-glass attenuation and focal architectural distortion. This lesion is irregular in shape and therefore difficult, to discretely measure but is estimated to be approximately 2.9 x 2.1 cm (image  62 of series 5), and has some peripheral areas adjacent to it which are also slightly nodular in appearance (image 63 of series 5), in the midst of some peripheral bronchiolectasis. Overall, this lesion appears slightly larger than the very recent CT scan from 11/20/2014, and although it demonstrates internal hypermetabolism (SUVmax = 5.1 centrally and 2.7 peripherally), this is strongly favored to be infectious or inflammatory in etiology. No hypermetabolic mediastinal or hilar nodes. Status post left upper lobectomy. There is a background of severe centrilobular and mild paraseptal emphysema. Small calcified granuloma in the right upper lobe is unchanged. Heart size is normal. There is no significant pericardial fluid, thickening or pericardial calcification. There is atherosclerosis of the thoracic aorta, the great vessels of the mediastinum and the coronary arteries, including calcified atherosclerotic plaque in the left anterior descending and right coronary arteries. Several small calcified mediastinal and left hilar lymph nodes are noted. Esophagus is unremarkable in appearance. ABDOMEN/PELVIS No abnormal hypermetabolic activity within the liver, pancreas, adrenal glands, or spleen. No hypermetabolic lymph nodes in the abdomen or pelvis. Status post cholecystectomy. 7 mm low attenuation lesion in segment 4A of the liver is incompletely characterized, but demonstrates no hypermetabolism and is similar to the prior examination, likely a tiny cyst. 12 mm low-attenuation (-10 HU) left adrenal  nodule, compatible with an adrenal adenoma. No significant volume of ascites. No pneumoperitoneum. No pathologic distention of small bowel. Uterus and ovaries are unremarkable in appearance. SKELETON No focal hypermetabolic activity to suggest skeletal metastasis. IMPRESSION: 1. The lesion of concern in the upper pole of the right kidney has slightly increased in size compared to the recent prior examination and demonstrates hypermetabolism both centrally and peripherally. These imaging features are most suggestive of infection/inflammation. An empiric trial of antibiotics is suggested, followed by repeat noncontrast CT of the chest in 3-4 weeks to ensure the resolution of this finding. Although neoplasm can have this appearance, typically the appearance would be seen in the setting of adenocarcinoma which is a very slow-growing lesion that tends to be only minimally hypermetabolic (which is not characteristic of the behavior of this lesion at this time). 2. Severe centrilobular emphysema. 3. Status post left upper lobectomy. 4. Atherosclerosis, including 2 vessel coronary artery disease. Please note that although the presence of coronary artery calcium documents the presence of coronary artery disease, the severity of this disease and any potential stenosis cannot be assessed on this non-gated CT examination. Assessment for potential risk factor modification, dietary therapy or pharmacologic therapy may be warranted, if clinically indicated. 5. Additional incidental findings, as above. Electronically Signed   By: Vinnie Langton M.D.   On: 12/04/2014 13:52      IMPRESSION: Tammy Eaton is a 64 y.o. 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 Recent CT scan, has revealed a right upper lobe lung lesion that has increased in size from 2 to 2.9 cm. The patient will initially be treated with antibiotics to determine infection vs cancer. The patient has significant  emphysema therefore making biopsy of the mass difficult and she is potentially at risk for pneumothorax.   PLAN: The patient will follow up with Dr.Kale regarding antibiotics/predisone with a subsequent CT scan 4 weeks from now. She will also discuss the pros and cons of biopsying the mass. I will review her case after her antibiotics treatment and determine if she will be a good candidate for radiation treatment to the lung mass.   Follow  up in radiation oncology on Dec 5th  I spent 60 minutes minutes face to face with the patient and more than 50% of that time was spent in counseling and/or coordination of care.   ------------------------------------------------  Sheral Apley. Tammi Klippel, M.D.  This document serves as a record of services personally performed by Tyler Pita, MD. It was created on his behalf by Derek Mound, a trained medical scribe. The creation of this record is based on the scribe's personal observations and the provider's statements to them. This document has been checked and approved by the attending provider.

## 2014-12-11 NOTE — Progress Notes (Signed)
See progress note under physician encounter. 

## 2014-12-18 ENCOUNTER — Other Ambulatory Visit (INDEPENDENT_AMBULATORY_CARE_PROVIDER_SITE_OTHER): Payer: 59

## 2014-12-18 DIAGNOSIS — E559 Vitamin D deficiency, unspecified: Secondary | ICD-10-CM

## 2014-12-19 LAB — VITAMIN D 25 HYDROXY (VIT D DEFICIENCY, FRACTURES): VIT D 25 HYDROXY: 40 ng/mL (ref 30–100)

## 2014-12-20 ENCOUNTER — Telehealth: Payer: Self-pay | Admitting: *Deleted

## 2014-12-20 NOTE — Telephone Encounter (Signed)
-----   Message from Kem Boroughs, [place] sent at 12/19/2014  8:35 AM EST ----- Please let pt know that Vit D has done great with RX:  Went from 27 to 40.  Because she did not respond very well to OTC 2000 IU daily - For the winter I want her to continue RX Vit D at every other week and will recheck at [facility].

## 2014-12-20 NOTE — Telephone Encounter (Signed)
I have attempted to contact this patient by phone with the following results: left message to return call to [place] at [phone] answering machine (home per [facility]).  Advised call was regarding recent labs.  [phone] (Home) *Preferred*

## 2014-12-21 NOTE — Telephone Encounter (Signed)
Patient returning your call.

## 2014-12-22 MED ORDER — VITAMIN D (ERGOCALCIFEROL) 1.25 MG (50000 UNIT) PO CAPS: 50000.0000 [IU] | ORAL_CAPSULE | ORAL | Status: DC

## 2014-12-22 NOTE — Telephone Encounter (Signed)
Notified patient of Vit D results and recommendations.  Pt is agreeable with this plan.  Pt does not have prescription Vit D.  This is sent to [facility] for her.

## 2014-12-25 ENCOUNTER — Encounter: Payer: Self-pay | Admitting: Internal Medicine

## 2014-12-25 ENCOUNTER — Ambulatory Visit (INDEPENDENT_AMBULATORY_CARE_PROVIDER_SITE_OTHER): Payer: 59 | Admitting: Internal Medicine

## 2014-12-25 VITALS — BP 96/62 | HR 80 | Ht 66.0 in | Wt 148.0 lb

## 2014-12-25 DIAGNOSIS — R911 Solitary pulmonary nodule: Secondary | ICD-10-CM | POA: Diagnosis not present

## 2014-12-25 DIAGNOSIS — J449 Chronic obstructive pulmonary disease, unspecified: Secondary | ICD-10-CM | POA: Diagnosis not present

## 2014-12-25 NOTE — Patient Instructions (Addendum)
My recommendation is to do an excisional bx if this lesion is growing and you could tolerate a full Right upper lobectomy if needed and Dr Roxan Hockey is the one I recommend use it.   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

## 2014-12-25 NOTE — Assessment & Plan Note (Signed)
PFT's 11/28/14   FEV1 1.87 (72%) ratio 51 and dlco 43 corrects to 44   She only has moderate dz and with MMRC = 0 / 1 so could easily tolerate a RULobectomy if this proves to be malignant as I suspect is more likley than not since has no signs or h/o symptoms pointing to infection   Discussed in detail all the  indications, usual  risks and alternatives  relative to the benefits with patient who agrees to proceed with T surgery consultation if this lesion is growing and if it is not can be watched q 3 m vs attempt to bx via navigational bx / Dr Lamonte Sakai.  I had an extended discussion with the patient reviewing all relevant studies completed to date and  lasting 57mnutes of a 60 minute visit    Each maintenance medication was reviewed in detail including most importantly the difference between maintenance and prns and under what circumstances the prns are to be triggered using an action plan format that is not reflected in the computer generated alphabetically organized AVS.    Please see instructions for details which were reviewed in writing and the patient given a copy highlighting the part that I personally wrote and discussed at today's ov.

## 2014-12-25 NOTE — Assessment & Plan Note (Addendum)
See comments re excisional bx / formal RULobectomy recs but would do a quantiferon GOLD Tb test now and if pos delay any bx's until prove this isn't TB

## 2014-12-25 NOTE — Progress Notes (Signed)
Subjective:    Patient ID: Tammy Eaton, female    DOB: [birth year],    MRN: [id number]  HPI  77 yowf quit smoking 2010 s/p LULobectomy by Arlyce Dice stage 1 with no adjuvant therapy on yearly CT Chest with new RUL nodule on f/u by Dr Irene Limbo who referred her to pulmonary clinic 12/25/2014     12/25/2014 1st [facility] office visit/ Tammy Eaton   Chief Complaint  Patient presents with  . Pulmonary Consult    Referred by Dr. Irene Limbo for eval of abnormal ct chest. Pt states had Lung CA back in 2005.  She had recent CT Chest and PET scan.   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 No unusual exp hx or h/o childhood pna/ asthma or knowledge of premature birth.  Sleeping ok without nocturnal  or early am exacerbation  of respiratory  c/o's or need for noct saba. Also denies any obvious fluctuation of symptoms with weather or environmental changes or other aggravating or alleviating factors except as outlined above   Current Medications, Allergies, Complete Past Medical History, Past Surgical History, Family History, and Social History were reviewed in [employer] record.            Review of Systems  Constitutional: Negative for fever, chills and unexpected weight change.  HENT: Positive for congestion. Negative for dental problem, ear pain, nosebleeds, postnasal drip, rhinorrhea, sinus pressure, sneezing, sore throat, trouble swallowing and voice change.   Eyes: Negative for visual disturbance.  Respiratory: Negative for cough,  choking and shortness of breath.   Cardiovascular: Negative for chest pain and leg swelling.  Gastrointestinal: Negative for vomiting, abdominal pain and diarrhea.  Genitourinary: Negative for difficulty urinating.  Musculoskeletal: Positive for arthralgias.  Skin: Negative for rash.  Neurological: Negative for tremors, syncope and headaches.  Hematological: Does not bruise/bleed easily.       Objective:   Physical Exam  Anxious but pleasant amb wf nad  Wt Readings from Last 3 Encounters:  12/25/14 148 lb (67.132 kg)  12/11/14 145 lb 9.6 oz (66.044 kg)  12/07/14 145 lb 1.6 oz (65.817 kg)    Vital signs reviewed  HEENT: nl dentition, turbinates, and oropharynx. Nl external ear canals without cough reflex   NECK :  without JVD/Nodes/TM/ nl carotid upstrokes bilaterally   LUNGS: no acc muscle use, clear to A and P bilaterally without cough on insp or exp maneuvers   CV:  RRR  no s3 or murmur or increase in P2, no edema   ABD:  soft and nontender with nl excursion in the supine position. No bruits or organomegaly, bowel sounds nl  MS:  warm without deformities, calf tenderness, cyanosis or clubbing  SKIN: warm and dry without lesions    NEURO:  alert, approp, no deficits    I personally reviewed images and agree with radiology impression as follows:  CT 11/20/14 vs 12/2012  1. New right upper lobe nodular opacity is identified measuring 2 Cm 2.  Emphysema      Assessment & Plan:

## 2014-12-26 ENCOUNTER — Telehealth: Payer: Self-pay | Admitting: *Deleted

## 2014-12-26 DIAGNOSIS — R911 Solitary pulmonary nodule: Secondary | ICD-10-CM

## 2014-12-26 DIAGNOSIS — R9389 Abnormal findings on diagnostic imaging of other specified body structures: Secondary | ICD-10-CM

## 2014-12-26 NOTE — Telephone Encounter (Signed)
-----   Message from Tanda Rockers, MD sent at 12/25/2014  8:03 PM EST ----- Let her know after review of all her studies I rec Quantiferon Gold next

## 2014-12-26 NOTE — Telephone Encounter (Signed)
LMTCB

## 2014-12-27 NOTE — Telephone Encounter (Signed)
Called and spoke to pt. Informed her of the recs per MW. Order placed. Pt verbalized understanding and denied any further questions or concerns at this time.

## 2014-12-29 ENCOUNTER — Other Ambulatory Visit: Payer: 59

## 2014-12-29 ENCOUNTER — Telehealth: Payer: Self-pay | Admitting: Hematology

## 2014-12-29 DIAGNOSIS — R9389 Abnormal findings on diagnostic imaging of other specified body structures: Secondary | ICD-10-CM

## 2014-12-29 NOTE — Telephone Encounter (Signed)
PAL - moved 12/1 f/u to 11/30. Spoke with patient she is aware.

## 2014-12-31 LAB — QUANTIFERON TB GOLD ASSAY (BLOOD)
Interferon Gamma Release Assay: NEGATIVE
QUANTIFERON NIL VALUE: 0.03 [IU]/mL
Quantiferon Tb Ag Minus Nil Value: 0 IU/mL
TB AG VALUE: 0.03 [IU]/mL

## 2015-01-01 NOTE — Progress Notes (Signed)
Quick Note:  Contacted pt with results per MW Pt expressed understanding, no further concerns ______

## 2015-01-02 ENCOUNTER — Ambulatory Visit (HOSPITAL_COMMUNITY)
Admission: RE | Admit: 2015-01-02 | Discharge: 2015-01-02 | Disposition: A | Discharge status: Home / Self Care | Payer: 59 | Source: Ambulatory Visit | Attending: Hematology | Admitting: Hematology

## 2015-01-02 DIAGNOSIS — R911 Solitary pulmonary nodule: Secondary | ICD-10-CM | POA: Diagnosis not present

## 2015-01-02 DIAGNOSIS — J439 Emphysema, unspecified: Secondary | ICD-10-CM | POA: Insufficient documentation

## 2015-01-02 DIAGNOSIS — Z792 Long term (current) use of antibiotics: Secondary | ICD-10-CM | POA: Insufficient documentation

## 2015-01-02 DIAGNOSIS — J189 Pneumonia, unspecified organism: Secondary | ICD-10-CM

## 2015-01-02 DIAGNOSIS — Z7952 Long term (current) use of systemic steroids: Secondary | ICD-10-CM | POA: Insufficient documentation

## 2015-01-03 ENCOUNTER — Other Ambulatory Visit: Payer: Self-pay | Admitting: *Deleted

## 2015-01-03 ENCOUNTER — Ambulatory Visit (HOSPITAL_BASED_OUTPATIENT_CLINIC_OR_DEPARTMENT_OTHER): Payer: 59 | Admitting: Hematology

## 2015-01-03 ENCOUNTER — Telehealth: Payer: Self-pay | Admitting: Hematology

## 2015-01-03 ENCOUNTER — Encounter: Payer: Self-pay | Admitting: Hematology

## 2015-01-03 VITALS — BP 107/76 | HR 86 | Temp 98.2°F | Resp 18 | Ht 66.0 in | Wt 143.5 lb

## 2015-01-03 DIAGNOSIS — Z85118 Personal history of other malignant neoplasm of bronchus and lung: Secondary | ICD-10-CM

## 2015-01-03 DIAGNOSIS — R911 Solitary pulmonary nodule: Secondary | ICD-10-CM | POA: Diagnosis not present

## 2015-01-03 NOTE — Telephone Encounter (Signed)
GAVE PATIENT AVS REPORT AND APPOINTMENTS FOR MARCH. LEFT MESSAGE WITH SERVICE AT [facility] REQUESTING A CALL BACK WITH THE STATUS OF REFERRAL FROM 10/21.

## 2015-01-04 ENCOUNTER — Ambulatory Visit: Payer: 59 | Admitting: Hematology

## 2015-01-08 ENCOUNTER — Institutional Professional Consult (permissible substitution) (INDEPENDENT_AMBULATORY_CARE_PROVIDER_SITE_OTHER): Payer: 59 | Admitting: Thoracic Surgery (Cardiothoracic Vascular Surgery)

## 2015-01-08 ENCOUNTER — Encounter: Payer: Self-pay | Admitting: Thoracic Surgery (Cardiothoracic Vascular Surgery)

## 2015-01-08 VITALS — BP 107/66 | HR 72 | Resp 16 | Ht 66.0 in | Wt 143.0 lb

## 2015-01-08 DIAGNOSIS — D381 Neoplasm of uncertain behavior of trachea, bronchus and lung: Secondary | ICD-10-CM | POA: Diagnosis not present

## 2015-01-08 DIAGNOSIS — Z85118 Personal history of other malignant neoplasm of bronchus and lung: Secondary | ICD-10-CM | POA: Diagnosis not present

## 2015-01-08 NOTE — Progress Notes (Signed)
PCP is Marjorie Smolder, MD Referring Provider is Brunetta Genera, MD/ Christinia Gully, MD- Pulmonary  Chief Complaint  Patient presents with  . Lung Lesion    right upper lobe per CT CHEST and PET    HPI: 64 year old woman sent for consultation regarding a right upper lobe lung nodule.  Mrs. Bastien is a 64 year old woman with a history of tobacco abuse, COPD, and stage IA lung cancer treated with a left upper lobectomy by Dr. Arlyce Dice in 2005. Her past medical history is also significant for arthritis, hiatal hernia with reflux esophagitis, depression, hyperlipidemia, and osteoarthritis.  She had an appointment with Dr. Irene Limbo in October. A CT scan showed a new right upper lobe opacity. This was in the background of severe emphysematous changes in both lungs. A PET CT was done which showed the area had increased slightly in size and was hypermetabolic. Radiology favored this being an infectious or inflammatory in nature. A repeat CT was done last week which again showed increase in size of the nodule. Radiology still felt this was likely infectious or inflammatory.  She has been feeling well. She does have shortness of breath with exertion when she pushes a wheelbarrow or caries her 30 pound grandchild more than about 100 yards. She has not had any shortness of breath with routine activities. She does have wheezing occasionally. She has not had any unusual cough or hemoptysis. She says she's gained 10 pounds in the past 3 months. She has not had any chest pain, pressure, or tightness. She denies any exposure to tuberculosis. She denies fevers, chills, and night sweats.  Zubrod Score: At the time of surgery this patient's most appropriate activity status/level should be described as: '[]'$     0    Normal activity, no symptoms '[x]'$     1    Restricted in physical strenuous activity but ambulatory, able to do out light work '[]'$     2    Ambulatory and capable of self care, unable to do work activities, up  and about >50 % of waking hours                              '[]'$     3    Only limited self care, in bed greater than 50% of waking hours '[]'$     4    Completely disabled, no self care, confined to bed or chair '[]'$     5    Moribund   Past Medical History  Diagnosis Date  . Arthritis   . COPD (chronic obstructive pulmonary disease) ([place])   . Emphysema of lung ([place])   . Hyperlipidemia   . PONV (postoperative nausea and vomiting)     pt "could not eat during hospital stay"  . Shortness of breath   . Wears glasses   . lung ca 05/2003  . Depression   . Reflux esophagitis   . Lung cancer ([place])     s/p LUL lobectomy  . Hyperlipidemia   . Osteoarthritis     spine, knees, hands  . Rhinitis     Past Surgical History  Procedure Laterality Date  . Lung lobectomy Left 4/05  . Sinus surgery with instatrak    . Tubal ligation    . Dilation and curettage of uterus    . Vocal cord lateralization, endoscopic approach w/ mlb      nodule removed  . Knee surgery      rt  knee torn cartilage  . Cystoscopy  12/10    for evaluation of hematuria  . Breast surgery  1992    lt br bx  . Eye surgery      catataracts-both  . Retinal detachment surgery  2011    left  . Cholecystectomy  11/11/2011    Procedure: LAPAROSCOPIC CHOLECYSTECTOMY WITH INTRAOPERATIVE CHOLANGIOGRAM;  Surgeon: Odis Hollingshead, MD;  Location: [facility];  Service: General;  Laterality: N/A;  . Breast lumpectomy with needle localization Right 04/11/2013    Procedure: BREAST LUMPECTOMY WITH NEEDLE LOCALIZATION;  Surgeon: Odis Hollingshead, MD;  Location: [facility];  Service: General;  Laterality: Right;    Family History  Problem Relation Age of Onset  . Cancer Mother 12    ovarian cancer  . Hypertension Mother   . Diabetes Mother   . Alcohol abuse Father     Social History Social History  Substance Use Topics  . Smoking status: Former Smoker -- 2.00 packs/day for 40 years    Quit date: 09/21/2008  . Smokeless  tobacco: None  . Alcohol Use: Yes     Comment: SOCIALLY    Current Outpatient Prescriptions  Medication Sig Dispense Refill  . acetaminophen (TYLENOL) 325 MG tablet Take 650 mg by mouth every 6 (six) hours as needed.    Marland Kitchen albuterol (PROVENTIL HFA;VENTOLIN HFA) 108 (90 BASE) MCG/ACT inhaler Inhale 2 puffs into the lungs every 6 (six) hours as needed for wheezing or shortness of breath.    . ALPRAZolam (XANAX) 0.5 MG tablet Take 0.5 mg by mouth 3 (three) times daily as needed. For anxiety    . cetirizine (ZYRTEC) 10 MG tablet Take 10 mg by mouth daily.    . cholecalciferol (VITAMIN D) 1000 UNITS tablet Take 1,000 Units by mouth daily.    . CYMBALTA 60 MG capsule Take 60 mg by mouth daily.     . fish oil-omega-3 fatty acids 1000 MG capsule Take 1 g by mouth 3 (three) times daily.     . Fluticasone Furoate-Vilanterol (BREO ELLIPTA) 100-25 MCG/INH AEPB Inhale 1 puff into the lungs daily.     . Glucosamine Sulfate 750 MG TABS Take 750 mg by mouth 2 (two) times daily.    [name] 2 MG TABS Take 2 mg by mouth at bedtime.     . Magnesium 500 MG CAPS Take 500 mg by mouth daily.     . pseudoephedrine (SUDAFED) 30 MG tablet Take 30 mg by mouth every 4 (four) hours as needed for congestion.    . Vitamin D, Ergocalciferol, (DRISDOL) 50000 UNITS CAPS capsule Take 1 capsule (50,000 Units total) by mouth every 14 (fourteen) days. 30 capsule 1   No current facility-administered medications for this visit.    Allergies  Allergen Reactions  . Adhesive [Tape] Other (See Comments)    Burned skin  . Amoxicillin Other (See Comments)    thrush  . Septra [Sulfamethoxazole-Trimethoprim] Hives  . Oxycodone Rash    Review of Systems  Constitutional: Positive for unexpected weight change (10 pound weight gain in past 3-6 months). Negative for fever, chills, diaphoresis, activity change, appetite change and fatigue.  HENT: Negative for dental problem and trouble swallowing.   Eyes: Negative for visual  disturbance.  Respiratory: Positive for shortness of breath (with heavy exertion) and wheezing. Negative for cough and stridor.   Cardiovascular: Negative for chest pain and leg swelling.  Gastrointestinal: Positive for constipation.       Hiatal hernia  Genitourinary: Negative for dysuria and hematuria.  Musculoskeletal: Positive for joint swelling and arthralgias.  Neurological: Negative for dizziness, syncope and weakness.  Hematological: Negative for adenopathy. Does not bruise/bleed easily.  Psychiatric/Behavioral: Positive for dysphoric mood. The patient is nervous/anxious.   All other systems reviewed and are negative.   BP 107/66 mmHg  Pulse 72  Resp 16  Ht '5\' 6"'$  (1.676 m)  Wt 143 lb (64.864 kg)  BMI 23.09 kg/m2  SpO2 95%  LMP 09/26/2013 Physical Exam  Constitutional: She is oriented to person, place, and time. She appears well-developed and well-nourished. No distress.  HENT:  Head: Normocephalic and atraumatic.  Mouth/Throat: No oropharyngeal exudate.  Eyes: Conjunctivae and EOM are normal. Pupils are equal, round, and reactive to light. No scleral icterus.  Neck: Neck supple. No tracheal deviation present. No thyromegaly present.  Cardiovascular: Normal rate, regular rhythm, normal heart sounds and intact distal pulses.   No murmur heard. Pulmonary/Chest: Effort normal. She has no wheezes. She has no rales.  Well healed surgical scar left chest Diminished BS bilaterally  Abdominal: Soft. She exhibits no distension. There is no tenderness.  Musculoskeletal: Normal range of motion. She exhibits no edema.  Lymphadenopathy:    She has no cervical adenopathy.  Neurological: She is alert and oriented to person, place, and time. No cranial nerve deficit.  Motor intact  Skin: Skin is warm and dry.  Vitals reviewed.    Diagnostic Tests: CT CHEST WITHOUT CONTRAST  TECHNIQUE: Multidetector CT imaging of the chest was performed following the standard protocol without  IV contrast.  COMPARISON: PET-CT 12/04/2014. Chest CTs 11/20/2014 and 12/08/2012.  FINDINGS: Mediastinum/Nodes: There are no enlarged mediastinal, hilar or axillary lymph nodes. The thyroid gland, trachea and esophagus demonstrate no significant findings. The heart size is normal. There is no pericardial effusion. There is atherosclerosis of the aorta, great vessels and coronary arteries.  Lungs/Pleura: There is no pleural effusion. Again demonstrated is severe emphysema. There are scattered calcified granulomas in the right lung. The ill-defined parenchymal density in the right upper lobe appears mildly progressive and difficult to measure given its irregular shape. It does measure approximately 3.1 x 2.3 cm on image 18. Although this has central solid components, most obvious on the reformatted images, this is still favored to reflect an inflammatory process, especially given its evolution over this short interval. No other pulmonary nodules or infiltrates are identified. Azygos fissure noted.  Upper abdomen: No suspicious findings are identified within the upper abdomen. There is no adrenal mass. Low-density lesion in the left hepatic lobe on image 55 is stable. Previous cholecystectomy.  Musculoskeletal/Chest wall: There is no chest wall mass or suspicious osseous finding. Stable degenerative changes within upper lumbar spine and posttraumatic deformity of the mid sternum.  IMPRESSION: 1. The recently demonstrated right apical lesion has not resolved and appears slightly larger. However, based on the ill-defined nature of this lesion and its evolution, an inflammatory process is still favored over atypical neoplasm. Consider tuberculosis or fungal infection. 2. Patient reportedly has no clinical evidence of infection at this time, and therefore, atypical neoplasm cannot be completely excluded. Percutaneous tissue sampling is likely to the difficult given the  patient's severe emphysema. If tissue sampling is not performed, continued CT follow-up recommended. 3. No adenopathy or new findings. Diffuse atherosclerosis.   Electronically Signed  By: Richardean Sale M.D.  On: 01/02/2015 17:01  NUCLEAR MEDICINE PET SKULL BASE TO THIGH  TECHNIQUE: 7.1 MCi F-18 FDG was injected intravenously. Full-ring PET imaging was  performed from the skull base to thigh after the radiotracer. CT data was obtained and used for attenuation correction and anatomic localization.  FASTING BLOOD GLUCOSE: Value: 96 mg/dl  COMPARISON: Chest CT 11/20/2014. PET-CT 05/11/2003.  FINDINGS: NECK  No hypermetabolic lymph nodes in the neck. Hypermetabolic activity in the region of the vocal cords, without evidence of discrete mass, presumably physiologic.  CHEST  Again noted is an ill-defined nodular density in the right upper lobe, which appears slightly larger than the prior examination, but predominantly is manifest by a ground-glass attenuation and focal architectural distortion. This lesion is irregular in shape and therefore difficult, to discretely measure but is estimated to be approximately 2.9 x 2.1 cm (image 62 of series 5), and has some peripheral areas adjacent to it which are also slightly nodular in appearance (image 63 of series 5), in the midst of some peripheral bronchiolectasis. Overall, this lesion appears slightly larger than the very recent CT scan from 11/20/2014, and although it demonstrates internal hypermetabolism (SUVmax = 5.1 centrally and 2.7 peripherally), this is strongly favored to be infectious or inflammatory in etiology. No hypermetabolic mediastinal or hilar nodes. Status post left upper lobectomy. There is a background of severe centrilobular and mild paraseptal emphysema. Small calcified granuloma in the right upper lobe is unchanged. Heart size is normal. There is no significant pericardial fluid, thickening  or pericardial calcification. There is atherosclerosis of the thoracic aorta, the great vessels of the mediastinum and the coronary arteries, including calcified atherosclerotic plaque in the left anterior descending and right coronary arteries. Several small calcified mediastinal and left hilar lymph nodes are noted. Esophagus is unremarkable in appearance.  ABDOMEN/PELVIS  No abnormal hypermetabolic activity within the liver, pancreas, adrenal glands, or spleen. No hypermetabolic lymph nodes in the abdomen or pelvis. Status post cholecystectomy. 7 mm low attenuation lesion in segment 4A of the liver is incompletely characterized, but demonstrates no hypermetabolism and is similar to the prior examination, likely a tiny cyst. 12 mm low-attenuation (-10 HU) left adrenal nodule, compatible with an adrenal adenoma. No significant volume of ascites. No pneumoperitoneum. No pathologic distention of small bowel. Uterus and ovaries are unremarkable in appearance.  SKELETON  No focal hypermetabolic activity to suggest skeletal metastasis.  IMPRESSION: 1. The lesion of concern in the upper pole of the right kidney has slightly increased in size compared to the recent prior examination and demonstrates hypermetabolism both centrally and peripherally. These imaging features are most suggestive of infection/inflammation. An empiric trial of antibiotics is suggested, followed by repeat noncontrast CT of the chest in 3-4 weeks to ensure the resolution of this finding. Although neoplasm can have this appearance, typically the appearance would be seen in the setting of adenocarcinoma which is a very slow-growing lesion that tends to be only minimally hypermetabolic (which is not characteristic of the behavior of this lesion at this time). 2. Severe centrilobular emphysema. 3. Status post left upper lobectomy. 4. Atherosclerosis, including 2 vessel coronary artery disease. Please note  that although the presence of coronary artery calcium documents the presence of coronary artery disease, the severity of this disease and any potential stenosis cannot be assessed on this non-gated CT examination. Assessment for potential risk factor modification, dietary therapy or pharmacologic therapy may be warranted, if clinically indicated. 5. Additional incidental findings, as above.  Electronically Signed: By: Vinnie Langton M.D. On: 12/04/2014 13:52   Pulmonary function testing FVC= 3.60(107%), 3.68 postbronchodilator FEV1= 2.82(70%), 1.87 postbronchodilator FEV1/FVC= 51% DLCO= 40% predicted  I personally reviewed  the CT and PET/CT images and concur with the findings above  Impression: 64 year old woman with a history of for stage IA lung cancer 11 years ago as well as a history of heavy tobacco abuse (quit in 2010) and COPD who has an irregular nodule in the right upper lobe. While the possibility of a cancer cannot be ruled out, the radiographic appearance is more consistent with an inflammatory or infectious etiology.  I had a long discussion with Mrs. Thaker regarding our options for diagnosis and treatment. I discussed the relative advantages and disadvantages of each of those approaches with her. One option would be continued radiographic follow-up. A second option would be navigational bronchoscopy for tissue sampling. Another possibility would be to treat with SBR T either with or without a diagnosis. And finally, surgical resection is an option. Of these a wedge resection would be the most definitive, but also would carry significant amount of risk given the severity of her emphysema. She is very reluctant to consider surgery given pain issues related to her previous operation.  My recommendation was that we attempt to do a navigational bronchoscopy for tissue sampling. I described the proposed procedure to her. She understands this would be done in the operating room  under general anesthesia with the expectation for an outpatient procedure. She understands that no guarantee can be given that we will be able to make a definitive diagnosis. She also understands that we might not be able to rule out cancer. I do think it is a reasonable first approach particularly given that the area in question appears more inflammatory radiographically and the high risk associated with surgery in her case. She understands that there is significant risk for failure to make a diagnosis and pneumothorax. She understands that if a pneumothorax occur she would likely require a chest tube and might need surgery.  Plan: Electromagnetic navigational bronchoscopy on Monday, 01/15/2015  I spent 60 minutes face-to-face with Mrs. Blauvelt during this visit, > 50% was spent in counseling  [name], MD [facility] [phone]

## 2015-01-09 ENCOUNTER — Other Ambulatory Visit: Payer: Self-pay | Admitting: *Deleted

## 2015-01-09 DIAGNOSIS — R911 Solitary pulmonary nodule: Secondary | ICD-10-CM

## 2015-01-11 ENCOUNTER — Encounter (HOSPITAL_COMMUNITY): Payer: Self-pay

## 2015-01-11 ENCOUNTER — Encounter (HOSPITAL_COMMUNITY)
Admission: RE | Admit: 2015-01-11 | Discharge: 2015-01-11 | Disposition: A | Discharge status: Home / Self Care | Payer: 59 | Source: Ambulatory Visit | Attending: Thoracic Surgery (Cardiothoracic Vascular Surgery) | Admitting: Thoracic Surgery (Cardiothoracic Vascular Surgery)

## 2015-01-11 VITALS — BP 102/54 | HR 80 | Temp 98.3°F | Resp 18 | Ht 66.0 in | Wt 144.7 lb

## 2015-01-11 DIAGNOSIS — R911 Solitary pulmonary nodule: Secondary | ICD-10-CM | POA: Diagnosis not present

## 2015-01-11 DIAGNOSIS — Z01812 Encounter for preprocedural laboratory examination: Secondary | ICD-10-CM | POA: Insufficient documentation

## 2015-01-11 HISTORY — DX: Adverse effect of unspecified anesthetic, initial encounter: T41.45XA

## 2015-01-11 HISTORY — DX: Other complications of anesthesia, initial encounter: T88.59XA

## 2015-01-11 HISTORY — DX: Anxiety disorder, unspecified: F41.9

## 2015-01-11 HISTORY — DX: Personal history of pneumonia (recurrent): Z87.01

## 2015-01-11 HISTORY — DX: Personal history of other diseases of the respiratory system: Z87.09

## 2015-01-11 LAB — COMPREHENSIVE METABOLIC PANEL
ALBUMIN: 3.9 g/dL (ref 3.5–5.0)
ALK PHOS: 81 U/L (ref 38–126)
ALT: 23 U/L (ref 14–54)
ANION GAP: 8 (ref 5–15)
AST: 24 U/L (ref 15–41)
BILIRUBIN TOTAL: 0.7 mg/dL (ref 0.3–1.2)
BUN: 11 mg/dL (ref 6–20)
CALCIUM: 9.3 mg/dL (ref 8.9–10.3)
CO2: 26 mmol/L (ref 22–32)
Chloride: 107 mmol/L (ref 101–111)
Creatinine, Ser: 0.59 mg/dL (ref 0.44–1.00)
GFR calc Af Amer: >60 mL/min (ref >60)
GLUCOSE: 109 mg/dL — AB (ref 65–99)
POTASSIUM: 4.2 mmol/L (ref 3.5–5.1)
SODIUM: 141 mmol/L (ref 135–145)
TOTAL PROTEIN: 6.7 g/dL (ref 6.5–8.1)

## 2015-01-11 LAB — PROTIME-INR
INR: 1.07 (ref 0.00–1.49)
PROTHROMBIN TIME: 14.1 s (ref 11.6–15.2)

## 2015-01-11 LAB — CBC
HCT: 45.4 % (ref 36.0–46.0)
HEMOGLOBIN: 15 g/dL (ref 12.0–15.0)
MCH: 29.8 pg (ref 26.0–34.0)
MCHC: 33 g/dL (ref 30.0–36.0)
MCV: 90.1 fL (ref 78.0–100.0)
Platelets: 282 10*3/uL (ref 150–400)
RBC: 5.04 MIL/uL (ref 3.87–5.11)
RDW: 13.2 % (ref 11.5–15.5)
WBC: 5.7 10*3/uL (ref 4.0–10.5)

## 2015-01-11 LAB — APTT: APTT: 32 s (ref 24–37)

## 2015-01-11 NOTE — Progress Notes (Signed)
Marland Kitchen    HEMATOLOGY/ONCOLOGY CONSULTATION NOTE  Date of Service: .  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  INTERVAL HISTORY:  Ms Sancho is here for f/u of her right upper lobe lung nodule and the results of her recent interval CT chest.  She has met with rad onc and pulmonary for their input. Dr Melvyn Novas suggested consideration for RULobectomy. She has not had a chance to meet with Dr Roxan Hockey yet at this time of this clinic visit. Had a good thanksgiving vacation in [place]. No other new pulmonary symptoms.  MEDICAL HISTORY:  Past Medical History  Diagnosis Date  . Arthritis   . COPD (chronic obstructive pulmonary disease) ([place])   . Emphysema of lung ([place])   . Hyperlipidemia   . PONV (postoperative nausea and vomiting)     pt "could not eat during hospital stay"  . Shortness of breath   . Wears glasses   . lung ca 05/2003  . Depression   . Reflux esophagitis   . Lung cancer ([place])     s/p LUL lobectomy  . Hyperlipidemia   . Osteoarthritis     spine, knees, hands  . Rhinitis     SURGICAL HISTORY: Past Surgical History  Procedure Laterality Date  . Lung lobectomy Left 4/05  . Sinus surgery with instatrak    . Tubal ligation    . Dilation and curettage of uterus    . Vocal cord lateralization, endoscopic approach w/ mlb      nodule removed  . Knee surgery      rt knee torn cartilage  . Cystoscopy  12/10    for evaluation of hematuria  . Breast surgery  1992    lt br bx  . Eye surgery      catataracts-both  . Retinal detachment surgery  2011    left  .  Cholecystectomy  11/11/2011    Procedure: LAPAROSCOPIC CHOLECYSTECTOMY WITH INTRAOPERATIVE CHOLANGIOGRAM;  Surgeon: Odis Hollingshead, MD;  Location: [facility];  Service: General;  Laterality: N/A;  . Breast lumpectomy with needle localization Right 04/11/2013    Procedure: BREAST LUMPECTOMY WITH NEEDLE LOCALIZATION;  Surgeon: Odis Hollingshead, MD;  Location: [facility];  Service: General;  Laterality: Right;    SOCIAL HISTORY: Social History   Social History  . Marital Status: Married    Spouse Name: N/A  . Number of Children: 2  . Years of Education: N/A   Occupational History  . Retired    Social History Main Topics  . Smoking status: Former Smoker -- 2.00 packs/day for 40 years    Quit date: 09/21/2008  . Smokeless tobacco: Not on file  . Alcohol Use: Yes     Comment: SOCIALLY  . Drug Use: No  . Sexual Activity: No   Other Topics Concern  . Not on file   Social History Narrative    FAMILY HISTORY: Family History  Problem Relation Age of Onset  . Cancer Mother 15    ovarian cancer  . Hypertension Mother   . Diabetes Mother   . Alcohol abuse Father     ALLERGIES:  is  allergic to adhesive; amoxicillin; septra; and oxycodone.  MEDICATIONS:  Current Outpatient Prescriptions  Medication Sig Dispense Refill  . acetaminophen (TYLENOL) 325 MG tablet Take 650 mg by mouth every 6 (six) hours as needed.    Marland Kitchen albuterol (PROVENTIL HFA;VENTOLIN HFA) 108 (90 BASE) MCG/ACT inhaler Inhale 2 puffs into the lungs every 6 (six) hours as needed for wheezing or shortness of breath.    . ALPRAZolam (XANAX) 0.5 MG tablet Take 0.5 mg by mouth 3 (three) times daily as needed. For anxiety    . cetirizine (ZYRTEC) 10 MG tablet Take 10 mg by mouth daily.    . cholecalciferol (VITAMIN D) 1000 UNITS tablet Take 1,000 Units by mouth daily.    . CYMBALTA 60 MG capsule Take 60 mg by mouth daily.     . fish oil-omega-3 fatty acids 1000 MG capsule Take 1 g by mouth 3 (three) times  daily.     . Fluticasone Furoate-Vilanterol (BREO ELLIPTA) 100-25 MCG/INH AEPB Inhale 1 puff into the lungs daily.     . Glucosamine Sulfate 750 MG TABS Take 750 mg by mouth 2 (two) times daily.    [name] 2 MG TABS Take 2 mg by mouth at bedtime.     . Magnesium 500 MG CAPS Take 500 mg by mouth daily.     . pseudoephedrine (SUDAFED) 30 MG tablet Take 30 mg by mouth every 4 (four) hours as needed for congestion.    . Vitamin D, Ergocalciferol, (DRISDOL) 50000 UNITS CAPS capsule Take 1 capsule (50,000 Units total) by mouth every 14 (fourteen) days. 30 capsule 1   No current facility-administered medications for this visit.    REVIEW OF SYSTEMS:    10 Point review of Systems was done is negative except as noted above.  PHYSICAL EXAMINATION: ECOG PERFORMANCE STATUS: 1 - Symptomatic but completely ambulatory  . Filed Vitals:   01/03/15 1542  BP: 107/76  Pulse: 86  Temp: 98.2 F (36.8 C)  Resp: 18   Filed Weights   01/03/15 1542  Weight: 143 lb 8 oz (65.091 kg)   .Body mass index is 23.17 kg/(m^2).  GENERAL:alert, in no acute distress and comfortable SKIN: skin color, texture, turgor are normal, no rashes or significant lesions EYES: normal, conjunctiva are pink and non-injected, sclera clear OROPHARYNX:no exudate, no erythema and lips, buccal mucosa, and tongue normal  NECK: supple, no JVD, thyroid normal size, non-tender, without nodularity LYMPH:  no palpable lymphadenopathy in the cervical, axillary or inguinal LUNGS: Decreased entry bilaterally with scattered rhonchi no rales  HEART: regular rate & rhythm,  no murmurs and no lower extremity edema ABDOMEN: abdomen soft, non-tender, normoactive bowel sounds  Musculoskeletal: no cyanosis of digits and no clubbing  PSYCH: alert & oriented x 3 with fluent speech NEURO: no focal motor/sensory deficits  LABORATORY DATA:  I have reviewed the data as listed  . CBC Latest Ref Rng 11/20/2014 11/24/2013 04/11/2013  WBC 3.9 -  10.3 10e3/uL 5.5 5.7 -  Hemoglobin 11.6 - 15.9 g/dL 15.3 16.0(H) 11.2(L)  Hematocrit 34.8 - 46.6 % 45.5 49.0(H) -  Platelets 145 - 400 10e3/uL 262 265 -    . CMP Latest Ref Rng 11/20/2014 11/24/2013 04/06/2013  Glucose 70 - 140 mg/dl 89 81 114(H)  BUN 7.0 - 26.0 mg/dL 10.2 12.7 12  Creatinine 0.6 - 1.1 mg/dL 0.7 0.8 0.63  Sodium 136 - 145 mEq/L 143 144 144  Potassium 3.5 - 5.1 mEq/L 3.9 3.9 4.3  Chloride 96 - 112 mEq/L - -  103  CO2 22 - 29 mEq/L 28 27 28   Calcium 8.4 - 10.4 mg/dL 9.2 9.6 9.2  Total Protein 6.4 - 8.3 g/dL 6.6 7.0 6.9  Total Bilirubin 0.20 - 1.20 mg/dL 0.87 0.74 0.8  Alkaline Phos 40 - 150 U/L 78 86 62  AST 5 - 34 U/L 16 16 23   ALT 0 - 55 U/L 17 19 19      RADIOGRAPHIC STUDIES: I have personally reviewed the radiological images as listed and agreed with the findings in the report. Ct Chest Wo Contrast  01/02/2015  CLINICAL DATA:  Follow up right lung nodule. History of left-sided lung cancer diagnosed in 2005 with surgery. Subsequent encounter. EXAM: CT CHEST WITHOUT CONTRAST TECHNIQUE: Multidetector CT imaging of the chest was performed following the standard protocol without IV contrast. COMPARISON:  PET-CT 12/04/2014. Chest CTs 11/20/2014 and 12/08/2012. FINDINGS: Mediastinum/Nodes: There are no enlarged mediastinal, hilar or axillary lymph nodes. The thyroid gland, trachea and esophagus demonstrate no significant findings. The heart size is normal. There is no pericardial effusion. There is atherosclerosis of the aorta, great vessels and coronary arteries. Lungs/Pleura: There is no pleural effusion. Again demonstrated is severe emphysema. There are scattered calcified granulomas in the right lung. The ill-defined parenchymal density in the right upper lobe appears mildly progressive and difficult to measure given its irregular shape. It does measure approximately 3.1 x 2.3 cm on image 18. Although this has central solid components, most obvious on the reformatted images,  this is still favored to reflect an inflammatory process, especially given its evolution over this short interval. No other pulmonary nodules or infiltrates are identified. Azygos fissure noted. Upper abdomen: No suspicious findings are identified within the upper abdomen. There is no adrenal mass. Low-density lesion in the left hepatic lobe on image 55 is stable. Previous cholecystectomy. Musculoskeletal/Chest wall: There is no chest wall mass or suspicious osseous finding. Stable degenerative changes within upper lumbar spine and posttraumatic deformity of the mid sternum. IMPRESSION: 1. The recently demonstrated right apical lesion has not resolved and appears slightly larger. However, based on the ill-defined nature of this lesion and its evolution, an inflammatory process is still favored over atypical neoplasm. Consider tuberculosis or fungal infection. 2. Patient reportedly has no clinical evidence of infection at this time, and therefore, atypical neoplasm cannot be completely excluded. Percutaneous tissue sampling is likely to the difficult given the patient's severe emphysema. If tissue sampling is not performed, continued CT follow-up recommended. 3. No adenopathy or new findings.  Diffuse atherosclerosis. Electronically Signed   By: Richardean Sale M.D.   On: 01/02/2015 17:01    ASSESSMENT & PLAN:   64 year old Caucasian female with history of COPD and prior history of left upper lobe stage IA non-small cell lung cancer status post left upper lobectomy here for her annual follow-up. CT showed evidence of 2 cm right upper lobe opacity. PET/CT scan did not showing definite evidence of a neoplastic nodule and cannot rule out the presence of inflammation/infection causing uneven FDG avidity.  #1 history of stage IA left upper lobe non-small cell lung cancer status post left upper lobectomy in 2005. #2 new right upper lobe lung opacity - unresolved/slightly larger on f/u CT chest. Did not improve with  steroids and empiric antibiotics. TB quantiferon assay neg. #3 history of COPD not on home oxygen #4 ex-smoker quit in 2010. An 80-pack-year history of smoking. #5 atypical ductal hyperplasia Plan -seen by radiation oncology (Dr Tammi Klippel) and pulmonary (Dr Melvyn Novas) -as per pulmonary -  appears to have adequate lung capacity to tolerate RUL if so recommeded by surgery. -percutaneous biopsy difficulty due to significant emphysema. -Messaged Dr Roxan Hockey (CT surgery) -- patient was subsequently seen by him in clinic and after discussion of possible treatment options -patient chooses to pursue a navigational bronchoscopic evaluation/biopsy of the RUL lesion. -further management based on results of bronchoscopic evaluation.  Return to care in 12 weeks with Dr Irene Limbo for f/u post surgical evaluation/treatment if necessary.  All the patient's questions answered to her apparent satisfaction.  I spent 15 minutes counseling the patient face to face. The total time spent in the appointment was 20 minutes and more than 50% was on counseling and direct patient cares.    Sullivan Lone MD [place] AAHIVMS [facility] Hematology/Oncology Physician [place]  (Office):       [phone] (Work cell):  [phone] (Fax):           [phone]

## 2015-01-11 NOTE — Pre-Procedure Instructions (Signed)
    SIGNORA ZUCCO  01/11/2015      [email or web address] [address]Phone:: [phone]:: [phone]   Your procedure is scheduled on Monday, December 12th, 2016.  [facility]  Call this number if you have problems the morning of surgery:  [phone]   Remember:  Do not eat food or drink liquids after midnight.   Take these medicines the morning of surgery with A SIP OF WATER: Acetaminophen (Tylenol) if needed, Albuterol inhaler if needed (please bring with you), Alprazolam (Xanax) if needed, Cetirizine (Zyrtec), Cymbalta, Fluticasone Furoate-Vilanterol (Breo Ellipta) inhaler.  Stop taking: Fish Oil, Pseudoephedrine (Sudafed), Aspirin, NSAIDS, Aleve, Naproxen, Ibuprofen, Advil, Motrin, BC's, Goody's, all herbal medications, and all vitamins.    Do not wear jewelry, make-up or nail polish.  Do not wear lotions, powders, or perfumes.  You may wear deodorant.  Do not shave 48 hours prior to surgery.    Do not bring valuables to the hospital.  [facility] belongings or [facility].  Contacts, dentures or bridgework may not be worn into surgery.  Leave your suitcase in the car.  After surgery it may be brought to your room.  For patients admitted to the hospital, discharge time will be determined by your treatment team.  Patients discharged the day of surgery will not be allowed to drive home.   Special instructions:  See attached.   Please read over the following fact sheets that you were given. Pain Booklet, Coughing and Deep Breathing and Surgical Site Infection Prevention

## 2015-01-11 NOTE — Progress Notes (Signed)
PCP - Dr. Darcus Austin Cardiologist - Dr. Irish Lack  EKG - denies CXR- scheduled for DOS  Stress test- 2013 - Epic Cardiac Cath - denies  Patient denies chest pain and shortness of breath at PAT appointment.

## 2015-01-14 NOTE — Anesthesia Preprocedure Evaluation (Addendum)
Anesthesia Evaluation  Patient identified by MRN, date of birth, ID band Patient awake    Reviewed: Allergy & Precautions, NPO status , Patient's Chart, lab work & pertinent test results  History of Anesthesia Complications (+) PONV  Airway Mallampati: I  TM Distance: >3 FB Neck ROM: Full    Dental  (+) Teeth Intact, Dental Advisory Given   Pulmonary COPD, former smoker,    breath sounds clear to auscultation       Cardiovascular negative cardio ROS   Rhythm:Regular Rate:Normal     Neuro/Psych Anxiety Depression negative neurological ROS     GI/Hepatic negative GI ROS, Neg liver ROS,   Endo/Other  negative endocrine ROS  Renal/GU negative Renal ROS     Musculoskeletal  (+) Arthritis ,   Abdominal   Peds  Hematology negative hematology ROS (+)   Anesthesia Other Findings   Reproductive/Obstetrics                              Component Value Date/Time   WBC 5.7 01/11/2015 1601   WBC 5.5 11/20/2014 0919   RBC 5.04 01/11/2015 1601   RBC 5.20 11/20/2014 0919   HGB 15.0 01/11/2015 1601   HGB 15.3 11/20/2014 0919   HCT 45.4 01/11/2015 1601   HCT 45.5 11/20/2014 0919   PLT 282 01/11/2015 1601   PLT 262 11/20/2014 0919   LABPROT 14.1 01/11/2015 1601   INR 1.07 01/11/2015 1601   APTT 32 01/11/2015 1601      Component Value Date/Time   NA 141 01/11/2015 1601   NA 143 11/20/2014 0920   K 4.2 01/11/2015 1601   K 3.9 11/20/2014 0920   CL 107 01/11/2015 1601   CL 104 11/27/2011 1349   CO2 26 01/11/2015 1601   CO2 28 11/20/2014 0920   BUN 11 01/11/2015 1601   BUN 10.2 11/20/2014 0920   CREATININE 0.59 01/11/2015 1601   CREATININE 0.7 11/20/2014 0920   CALCIUM 9.3 01/11/2015 1601   CALCIUM 9.2 11/20/2014 0920   ALKPHOS 81 01/11/2015 1601   ALKPHOS 78 11/20/2014 0920   AST 24 01/11/2015 1601   AST 16 11/20/2014 0920   ALT 23 01/11/2015 1601   ALT 17 11/20/2014 0920   BILITOT 0.7  01/11/2015 1601   BILITOT 0.87 11/20/2014 0920      Anesthesia Physical Anesthesia Plan  ASA: III  Anesthesia Plan: General   Post-op Pain Management:    Induction: Intravenous  Airway Management Planned: Oral ETT  Additional Equipment:   Intra-op Plan:   Post-operative Plan: Extubation in OR  Informed Consent: I have reviewed the patients History and Physical, chart, labs and discussed the procedure including the risks, benefits and alternatives for the proposed anesthesia with the patient or authorized representative who has indicated his/her understanding and acceptance.     Plan Discussed with: CRNA  Anesthesia Plan Comments:         Anesthesia Quick Evaluation

## 2015-01-15 ENCOUNTER — Encounter (HOSPITAL_COMMUNITY)
Admission: RE | Disposition: A | Discharge status: Home / Self Care | Payer: Self-pay | Source: Ambulatory Visit | Attending: Thoracic Surgery (Cardiothoracic Vascular Surgery)

## 2015-01-15 ENCOUNTER — Ambulatory Visit (HOSPITAL_COMMUNITY): Payer: 59 | Admitting: Anesthesiology

## 2015-01-15 ENCOUNTER — Ambulatory Visit (HOSPITAL_COMMUNITY)
Admission: RE | Admit: 2015-01-15 | Discharge: 2015-01-15 | Disposition: A | Discharge status: Home / Self Care | Payer: 59 | Source: Ambulatory Visit | Attending: Thoracic Surgery (Cardiothoracic Vascular Surgery) | Admitting: Thoracic Surgery (Cardiothoracic Vascular Surgery)

## 2015-01-15 ENCOUNTER — Ambulatory Visit (HOSPITAL_COMMUNITY): Payer: 59

## 2015-01-15 ENCOUNTER — Encounter (HOSPITAL_COMMUNITY): Payer: Self-pay | Admitting: *Deleted

## 2015-01-15 DIAGNOSIS — M1990 Unspecified osteoarthritis, unspecified site: Secondary | ICD-10-CM | POA: Insufficient documentation

## 2015-01-15 DIAGNOSIS — J432 Centrilobular emphysema: Secondary | ICD-10-CM | POA: Diagnosis not present

## 2015-01-15 DIAGNOSIS — E785 Hyperlipidemia, unspecified: Secondary | ICD-10-CM | POA: Insufficient documentation

## 2015-01-15 DIAGNOSIS — F329 Major depressive disorder, single episode, unspecified: Secondary | ICD-10-CM | POA: Insufficient documentation

## 2015-01-15 DIAGNOSIS — I2510 Atherosclerotic heart disease of native coronary artery without angina pectoris: Secondary | ICD-10-CM | POA: Diagnosis not present

## 2015-01-15 DIAGNOSIS — Z87891 Personal history of nicotine dependence: Secondary | ICD-10-CM | POA: Insufficient documentation

## 2015-01-15 DIAGNOSIS — K210 Gastro-esophageal reflux disease with esophagitis: Secondary | ICD-10-CM | POA: Insufficient documentation

## 2015-01-15 DIAGNOSIS — K449 Diaphragmatic hernia without obstruction or gangrene: Secondary | ICD-10-CM | POA: Diagnosis not present

## 2015-01-15 DIAGNOSIS — F419 Anxiety disorder, unspecified: Secondary | ICD-10-CM | POA: Insufficient documentation

## 2015-01-15 DIAGNOSIS — Z7951 Long term (current) use of inhaled steroids: Secondary | ICD-10-CM | POA: Insufficient documentation

## 2015-01-15 DIAGNOSIS — Z419 Encounter for procedure for purposes other than remedying health state, unspecified: Secondary | ICD-10-CM

## 2015-01-15 DIAGNOSIS — Z79899 Other long term (current) drug therapy: Secondary | ICD-10-CM | POA: Diagnosis not present

## 2015-01-15 DIAGNOSIS — R911 Solitary pulmonary nodule: Secondary | ICD-10-CM | POA: Insufficient documentation

## 2015-01-15 DIAGNOSIS — Z902 Acquired absence of lung [part of]: Secondary | ICD-10-CM | POA: Diagnosis not present

## 2015-01-15 DIAGNOSIS — Z85118 Personal history of other malignant neoplasm of bronchus and lung: Secondary | ICD-10-CM | POA: Diagnosis not present

## 2015-01-15 HISTORY — PX: VIDEO BRONCHOSCOPY WITH ENDOBRONCHIAL NAVIGATION: SHX6175

## 2015-01-15 SURGERY — VIDEO BRONCHOSCOPY WITH ENDOBRONCHIAL NAVIGATION
Anesthesia: General | Site: Chest

## 2015-01-15 MED ORDER — LIDOCAINE HCL (CARDIAC) 20 MG/ML IV SOLN
INTRAVENOUS | Status: AC
  Filled 2015-01-15: qty 5

## 2015-01-15 MED ORDER — GLYCOPYRROLATE 0.2 MG/ML IJ SOLN
INTRAMUSCULAR | Status: DC | PRN
  Administered 2015-01-15: 0.6 mg via INTRAVENOUS

## 2015-01-15 MED ORDER — PROPOFOL 10 MG/ML IV BOLUS
INTRAVENOUS | Status: AC
  Filled 2015-01-15: qty 20

## 2015-01-15 MED ORDER — GLYCOPYRROLATE 0.2 MG/ML IJ SOLN
INTRAMUSCULAR | Status: AC
  Filled 2015-01-15: qty 3

## 2015-01-15 MED ORDER — FENTANYL CITRATE (PF) 250 MCG/5ML IJ SOLN
INTRAMUSCULAR | Status: AC
  Filled 2015-01-15: qty 5

## 2015-01-15 MED ORDER — 0.9 % SODIUM CHLORIDE (POUR BTL) OPTIME
TOPICAL | Status: DC | PRN
  Administered 2015-01-15: 1000 mL

## 2015-01-15 MED ORDER — MIDAZOLAM HCL 5 MG/5ML IJ SOLN
INTRAMUSCULAR | Status: DC | PRN
  Administered 2015-01-15: 2 mg via INTRAVENOUS

## 2015-01-15 MED ORDER — HYDROCODONE-ACETAMINOPHEN 7.5-325 MG PO TABS: 1.0000 | ORAL_TABLET | Freq: Once | ORAL | Status: DC | PRN

## 2015-01-15 MED ORDER — LACTATED RINGERS IV SOLN
INTRAVENOUS | Status: DC | PRN
  Administered 2015-01-15: 07:00:00 via INTRAVENOUS

## 2015-01-15 MED ORDER — FENTANYL CITRATE (PF) 100 MCG/2ML IJ SOLN: 25.0000 ug | INTRAMUSCULAR | Status: DC | PRN

## 2015-01-15 MED ORDER — PROMETHAZINE HCL 25 MG/ML IJ SOLN: 6.2500 mg | INTRAMUSCULAR | Status: DC | PRN

## 2015-01-15 MED ORDER — ROCURONIUM BROMIDE 50 MG/5ML IV SOLN
INTRAVENOUS | Status: AC
  Filled 2015-01-15: qty 1

## 2015-01-15 MED ORDER — SUCCINYLCHOLINE CHLORIDE 20 MG/ML IJ SOLN
INTRAMUSCULAR | Status: AC
  Filled 2015-01-15: qty 1

## 2015-01-15 MED ORDER — MIDAZOLAM HCL 2 MG/2ML IJ SOLN
INTRAMUSCULAR | Status: AC
  Filled 2015-01-15: qty 2

## 2015-01-15 MED ORDER — ROCURONIUM BROMIDE 100 MG/10ML IV SOLN
INTRAVENOUS | Status: DC | PRN
  Administered 2015-01-15 (×2): 10 mg via INTRAVENOUS
  Administered 2015-01-15: 30 mg via INTRAVENOUS

## 2015-01-15 MED ORDER — EPINEPHRINE HCL 1 MG/ML IJ SOLN
INTRAMUSCULAR | Status: AC
  Filled 2015-01-15: qty 1

## 2015-01-15 MED ORDER — SODIUM CHLORIDE 0.9 % IJ SOLN
INTRAMUSCULAR | Status: AC
  Filled 2015-01-15: qty 10

## 2015-01-15 MED ORDER — NEOSTIGMINE METHYLSULFATE 10 MG/10ML IV SOLN
INTRAVENOUS | Status: DC | PRN
  Administered 2015-01-15: 4 mg via INTRAVENOUS

## 2015-01-15 MED ORDER — ONDANSETRON HCL 4 MG/2ML IJ SOLN
INTRAMUSCULAR | Status: DC | PRN
Start: 2015-01-15 — End: 2015-01-15
  Administered 2015-01-15: 4 mg via INTRAVENOUS

## 2015-01-15 MED ORDER — EPHEDRINE SULFATE 50 MG/ML IJ SOLN
INTRAMUSCULAR | Status: AC
  Filled 2015-01-15: qty 1

## 2015-01-15 MED ORDER — FENTANYL CITRATE (PF) 100 MCG/2ML IJ SOLN
INTRAMUSCULAR | Status: DC | PRN
  Administered 2015-01-15 (×2): 50 ug via INTRAVENOUS
  Administered 2015-01-15: 100 ug via INTRAVENOUS

## 2015-01-15 MED ORDER — PROPOFOL 10 MG/ML IV BOLUS
INTRAVENOUS | Status: DC | PRN
  Administered 2015-01-15: 120 mg via INTRAVENOUS

## 2015-01-15 MED ORDER — LIDOCAINE HCL (CARDIAC) 20 MG/ML IV SOLN
INTRAVENOUS | Status: DC | PRN
  Administered 2015-01-15: 60 mg via INTRAVENOUS
  Administered 2015-01-15: 100 mg via INTRAVENOUS

## 2015-01-15 MED ORDER — SODIUM CHLORIDE 0.9 % IV SOLN
10.0000 mg | INTRAVENOUS | Status: DC | PRN
  Administered 2015-01-15: 10 ug/min via INTRAVENOUS

## 2015-01-15 MED ORDER — ONDANSETRON HCL 4 MG/2ML IJ SOLN
INTRAMUSCULAR | Status: AC
  Filled 2015-01-15: qty 2

## 2015-01-15 SURGICAL SUPPLY — 34 items
BRUSH SUPERTRAX BIOPSY (INSTRUMENTS) IMPLANT
BRUSH SUPERTRAX NDL-TIP CYTO (INSTRUMENTS) ×2 IMPLANT
CANISTER SUCTION 2500CC (MISCELLANEOUS) ×2 IMPLANT
CHANNEL WORK EXTEND EDGE 180 (KITS) IMPLANT
CHANNEL WORK EXTEND EDGE 45 (KITS) IMPLANT
CHANNEL WORK EXTEND EDGE 90 (KITS) IMPLANT
CONT SPEC 4OZ CLIKSEAL STRL BL (MISCELLANEOUS) ×6 IMPLANT
COVER TABLE BACK 60X90 (DRAPES) ×2 IMPLANT
FILTER STRAW FLUID ASPIR (MISCELLANEOUS) IMPLANT
FORCEPS BIOP SUPERTRX PREMAR (INSTRUMENTS) ×2 IMPLANT
GAUZE SPONGE 4X4 12PLY STRL (GAUZE/BANDAGES/DRESSINGS) ×2 IMPLANT
GLOVE BIO SURGEON STRL SZ7 (GLOVE) ×2 IMPLANT
GLOVE SURG SIGNA 7.5 PF LTX (GLOVE) ×2 IMPLANT
GOWN STRL REUS W/ TWL XL LVL3 (GOWN DISPOSABLE) ×2 IMPLANT
GOWN STRL REUS W/TWL XL LVL3 (GOWN DISPOSABLE) ×2
KIT CLEAN ENDO COMPLIANCE (KITS) ×2 IMPLANT
KIT PROCEDURE EDGE 180 (KITS) IMPLANT
KIT PROCEDURE EDGE 45 (KITS) IMPLANT
KIT PROCEDURE EDGE 90 (KITS) ×2 IMPLANT
KIT ROOM TURNOVER OR (KITS) ×2 IMPLANT
MARKER SKIN DUAL TIP RULER LAB (MISCELLANEOUS) ×2 IMPLANT
NEEDLE SUPERTRX PREMARK BIOPSY (NEEDLE) ×2 IMPLANT
NS IRRIG 1000ML POUR BTL (IV SOLUTION) ×2 IMPLANT
OIL SILICONE PENTAX (PARTS (SERVICE/REPAIRS)) ×2 IMPLANT
PAD ARMBOARD 7.5X6 YLW CONV (MISCELLANEOUS) ×4 IMPLANT
PATCHES PATIENT (LABEL) ×6 IMPLANT
SYR 20CC LL (SYRINGE) ×2 IMPLANT
SYR 20ML ECCENTRIC (SYRINGE) ×2 IMPLANT
SYR 30ML LL (SYRINGE) ×2 IMPLANT
SYR 5ML LL (SYRINGE) ×2 IMPLANT
SYSTEM GENCUT CORE BIOPSY (NEEDLE) ×2 IMPLANT
TOWEL OR 17X24 6PK STRL BLUE (TOWEL DISPOSABLE) ×2 IMPLANT
TRAP SPECIMEN MUCOUS 40CC (MISCELLANEOUS) ×2 IMPLANT
TUBE CONNECTING 20X1/4 (TUBING) ×4 IMPLANT

## 2015-01-15 NOTE — Transfer of Care (Signed)
Immediate Anesthesia Transfer of Care Note  Patient: Tammy Eaton  Procedure(s) Performed: Procedure(s): VIDEO BRONCHOSCOPY WITH ENDOBRONCHIAL NAVIGATION (N/A)  Patient Location: PACU  Anesthesia Type:General  Level of Consciousness: awake, alert  and oriented  Airway & Oxygen Therapy: Patient Spontanous Breathing and Patient connected to nasal cannula oxygen  Post-op Assessment: Report given to RN, Post -op Vital signs reviewed and stable and Patient moving all extremities X 4  Post vital signs: Reviewed and stable  Last Vitals:  Filed Vitals:   01/15/15 0549 01/15/15 0906  BP: 124/46 110/60  Pulse: 79 86  Temp: 36.2 C 36.3 C  Resp: 16 7    Complications: No apparent anesthesia complications

## 2015-01-15 NOTE — Interval H&P Note (Signed)
History and Physical Interval Note:  01/15/2015 7:23 AM  Tammy Eaton  has presented today for surgery, with the diagnosis of RIGHT UPPER LOBE NODULE  The various methods of treatment have been discussed with the patient and family. After consideration of risks, benefits and other options for treatment, the patient has consented to  Procedure(s): [place] (N/A) as a surgical intervention .  The patient's history has been reviewed, patient examined, no change in status, stable for surgery.  I have reviewed the patient's chart and labs.  Questions were answered to the patient's satisfaction.     [name]

## 2015-01-15 NOTE — Discharge Instructions (Signed)
Do not drive or engage in heavy physical activity for 24 hours  You may resume normal activities tomorrow  You may cough up small amounts of blood over the next few days  Call [phone] if you develop chest pain, shortness of breath, fever > 101F or cough up large amounts of blood  My office will contact you with follow up information

## 2015-01-15 NOTE — Progress Notes (Signed)
Pts O2 sats dipping into high 80's low 90's, Dr. Roxan Hockey called and made aware, okay to send pt home with instructions to continue pulmonary toileting with incentive spirometry.

## 2015-01-15 NOTE — Anesthesia Postprocedure Evaluation (Signed)
Anesthesia Post Note  Patient: Tammy Eaton  Procedure(s) Performed: Procedure(s) (LRB): VIDEO BRONCHOSCOPY WITH ENDOBRONCHIAL NAVIGATION (N/A)  Patient location during evaluation: PACU Anesthesia Type: General Level of consciousness: awake and alert Pain management: pain level controlled Vital Signs Assessment: post-procedure vital signs reviewed and stable Respiratory status: spontaneous breathing Cardiovascular status: blood pressure returned to baseline Anesthetic complications: no    Last Vitals:  Filed Vitals:   01/15/15 0945 01/15/15 1036  BP: 95/62 93/65  Pulse:  67  Temp:    Resp:      Last Pain:  Filed Vitals:   01/15/15 1037  PainSc: 1                  Tiajuana Amass

## 2015-01-15 NOTE — Brief Op Note (Signed)
01/15/2015  9:23 AM  PATIENT:  Tammy Eaton  64 y.o. female  PRE-OPERATIVE DIAGNOSIS:  RIGHT UPPER LOBE NODULE  POST-OPERATIVE DIAGNOSIS:  RIGHT UPPER LOBE NODULE  PROCEDURE:  Procedure(s): VIDEO BRONCHOSCOPY WITH ENDOBRONCHIAL NAVIGATION (N/A)- needle aspirations, brushings , biopsies, Gen Cut biopsies and BAL  SURGEON:  Surgeon(s) and Role:    * [name], MD - Primary   ANESTHESIA:   general  EBL:  Total I/O In: 600 [I.V.:600] Out: -   BLOOD ADMINISTERED:none  DRAINS: none   LOCAL MEDICATIONS USED:  NONE  SPECIMEN:  Source of Specimen:  RUL nodule  DISPOSITION OF SPECIMEN:  PATH and MICRO  PLAN OF CARE: Discharge to home after PACU  PATIENT DISPOSITION:  PACU - hemodynamically stable.   Delay start of Pharmacological VTE agent (>24hrs) due to surgical blood loss or risk of bleeding: not applicable

## 2015-01-15 NOTE — Anesthesia Procedure Notes (Signed)
Procedure Name: Intubation Date/Time: 01/15/2015 7:39 AM Performed by: Kyung Rudd Pre-anesthesia Checklist: Patient identified, Emergency Drugs available, Suction available, Patient being monitored and Timeout performed Patient Re-evaluated:Patient Re-evaluated prior to inductionOxygen Delivery Method: Circle system utilized Preoxygenation: Pre-oxygenation with 100% oxygen Intubation Type: IV induction Ventilation: Mask ventilation without difficulty Laryngoscope Size: Mac and 3 Grade View: Grade I Tube type: Oral Tube size: 8.5 mm Number of attempts: 1 Airway Equipment and Method: Stylet and LTA kit utilized Placement Confirmation: ETT inserted through vocal cords under direct vision,  positive ETCO2 and breath sounds checked- equal and bilateral Secured at: 20 cm Tube secured with: Tape Dental Injury: Teeth and Oropharynx as per pre-operative assessment

## 2015-01-15 NOTE — H&P (View-Only) (Signed)
PCP is Marjorie Smolder, MD Referring Provider is Brunetta Genera, MD/ Christinia Gully, MD- Pulmonary  Chief Complaint  Patient presents with  . Lung Lesion    right upper lobe per CT CHEST and PET    HPI: 64 year old woman sent for consultation regarding a right upper lobe lung nodule.  Tammy Eaton is a 64 year old woman with a history of tobacco abuse, COPD, and stage IA lung cancer treated with a left upper lobectomy by Dr. Arlyce Dice in 2005. Her past medical history is also significant for arthritis, hiatal hernia with reflux esophagitis, depression, hyperlipidemia, and osteoarthritis.  She had an appointment with Dr. Irene Limbo in October. A CT scan showed a new right upper lobe opacity. This was in the background of severe emphysematous changes in both lungs. A PET CT was done which showed the area had increased slightly in size and was hypermetabolic. Radiology favored this being an infectious or inflammatory in nature. A repeat CT was done last week which again showed increase in size of the nodule. Radiology still felt this was likely infectious or inflammatory.  She has been feeling well. She does have shortness of breath with exertion when she pushes a wheelbarrow or caries her 30 pound grandchild more than about 100 yards. She has not had any shortness of breath with routine activities. She does have wheezing occasionally. She has not had any unusual cough or hemoptysis. She says she's gained 10 pounds in the past 3 months. She has not had any chest pain, pressure, or tightness. She denies any exposure to tuberculosis. She denies fevers, chills, and night sweats.  Zubrod Score: At the time of surgery this patient's most appropriate activity status/level should be described as: '[]'$     0    Normal activity, no symptoms '[x]'$     1    Restricted in physical strenuous activity but ambulatory, able to do out light work '[]'$     2    Ambulatory and capable of self care, unable to do work activities, up  and about >50 % of waking hours                              '[]'$     3    Only limited self care, in bed greater than 50% of waking hours '[]'$     4    Completely disabled, no self care, confined to bed or chair '[]'$     5    Moribund   Past Medical History  Diagnosis Date  . Arthritis   . COPD (chronic obstructive pulmonary disease) ([place])   . Emphysema of lung ([place])   . Hyperlipidemia   . PONV (postoperative nausea and vomiting)     pt "could not eat during hospital stay"  . Shortness of breath   . Wears glasses   . lung ca 05/2003  . Depression   . Reflux esophagitis   . Lung cancer ([place])     s/p LUL lobectomy  . Hyperlipidemia   . Osteoarthritis     spine, knees, hands  . Rhinitis     Past Surgical History  Procedure Laterality Date  . Lung lobectomy Left 4/05  . Sinus surgery with instatrak    . Tubal ligation    . Dilation and curettage of uterus    . Vocal cord lateralization, endoscopic approach w/ mlb      nodule removed  . Knee surgery      rt  knee torn cartilage  . Cystoscopy  12/10    for evaluation of hematuria  . Breast surgery  1992    lt br bx  . Eye surgery      catataracts-both  . Retinal detachment surgery  2011    left  . Cholecystectomy  11/11/2011    Procedure: LAPAROSCOPIC CHOLECYSTECTOMY WITH INTRAOPERATIVE CHOLANGIOGRAM;  Surgeon: Odis Hollingshead, MD;  Location: [facility];  Service: General;  Laterality: N/A;  . Breast lumpectomy with needle localization Right 04/11/2013    Procedure: BREAST LUMPECTOMY WITH NEEDLE LOCALIZATION;  Surgeon: Odis Hollingshead, MD;  Location: [facility];  Service: General;  Laterality: Right;    Family History  Problem Relation Age of Onset  . Cancer Mother 60    ovarian cancer  . Hypertension Mother   . Diabetes Mother   . Alcohol abuse Father     Social History Social History  Substance Use Topics  . Smoking status: Former Smoker -- 2.00 packs/day for 40 years    Quit date: 09/21/2008  . Smokeless  tobacco: None  . Alcohol Use: Yes     Comment: SOCIALLY    Current Outpatient Prescriptions  Medication Sig Dispense Refill  . acetaminophen (TYLENOL) 325 MG tablet Take 650 mg by mouth every 6 (six) hours as needed.    Marland Kitchen albuterol (PROVENTIL HFA;VENTOLIN HFA) 108 (90 BASE) MCG/ACT inhaler Inhale 2 puffs into the lungs every 6 (six) hours as needed for wheezing or shortness of breath.    . ALPRAZolam (XANAX) 0.5 MG tablet Take 0.5 mg by mouth 3 (three) times daily as needed. For anxiety    . cetirizine (ZYRTEC) 10 MG tablet Take 10 mg by mouth daily.    . cholecalciferol (VITAMIN D) 1000 UNITS tablet Take 1,000 Units by mouth daily.    . CYMBALTA 60 MG capsule Take 60 mg by mouth daily.     . fish oil-omega-3 fatty acids 1000 MG capsule Take 1 g by mouth 3 (three) times daily.     . Fluticasone Furoate-Vilanterol (BREO ELLIPTA) 100-25 MCG/INH AEPB Inhale 1 puff into the lungs daily.     . Glucosamine Sulfate 750 MG TABS Take 750 mg by mouth 2 (two) times daily.    [name] 2 MG TABS Take 2 mg by mouth at bedtime.     . Magnesium 500 MG CAPS Take 500 mg by mouth daily.     . pseudoephedrine (SUDAFED) 30 MG tablet Take 30 mg by mouth every 4 (four) hours as needed for congestion.    . Vitamin D, Ergocalciferol, (DRISDOL) 50000 UNITS CAPS capsule Take 1 capsule (50,000 Units total) by mouth every 14 (fourteen) days. 30 capsule 1   No current facility-administered medications for this visit.    Allergies  Allergen Reactions  . Adhesive [Tape] Other (See Comments)    Burned skin  . Amoxicillin Other (See Comments)    thrush  . Septra [Sulfamethoxazole-Trimethoprim] Hives  . Oxycodone Rash    Review of Systems  Constitutional: Positive for unexpected weight change (10 pound weight gain in past 3-6 months). Negative for fever, chills, diaphoresis, activity change, appetite change and fatigue.  HENT: Negative for dental problem and trouble swallowing.   Eyes: Negative for visual  disturbance.  Respiratory: Positive for shortness of breath (with heavy exertion) and wheezing. Negative for cough and stridor.   Cardiovascular: Negative for chest pain and leg swelling.  Gastrointestinal: Positive for constipation.       Hiatal hernia  Genitourinary: Negative for dysuria and hematuria.  Musculoskeletal: Positive for joint swelling and arthralgias.  Neurological: Negative for dizziness, syncope and weakness.  Hematological: Negative for adenopathy. Does not bruise/bleed easily.  Psychiatric/Behavioral: Positive for dysphoric mood. The patient is nervous/anxious.   All other systems reviewed and are negative.   BP 107/66 mmHg  Pulse 72  Resp 16  Ht '5\' 6"'$  (1.676 m)  Wt 143 lb (64.864 kg)  BMI 23.09 kg/m2  SpO2 95%  LMP 09/26/2013 Physical Exam  Constitutional: She is oriented to person, place, and time. She appears well-developed and well-nourished. No distress.  HENT:  Head: Normocephalic and atraumatic.  Mouth/Throat: No oropharyngeal exudate.  Eyes: Conjunctivae and EOM are normal. Pupils are equal, round, and reactive to light. No scleral icterus.  Neck: Neck supple. No tracheal deviation present. No thyromegaly present.  Cardiovascular: Normal rate, regular rhythm, normal heart sounds and intact distal pulses.   No murmur heard. Pulmonary/Chest: Effort normal. She has no wheezes. She has no rales.  Well healed surgical scar left chest Diminished BS bilaterally  Abdominal: Soft. She exhibits no distension. There is no tenderness.  Musculoskeletal: Normal range of motion. She exhibits no edema.  Lymphadenopathy:    She has no cervical adenopathy.  Neurological: She is alert and oriented to person, place, and time. No cranial nerve deficit.  Motor intact  Skin: Skin is warm and dry.  Vitals reviewed.    Diagnostic Tests: CT CHEST WITHOUT CONTRAST  TECHNIQUE: Multidetector CT imaging of the chest was performed following the standard protocol without  IV contrast.  COMPARISON: PET-CT 12/04/2014. Chest CTs 11/20/2014 and 12/08/2012.  FINDINGS: Mediastinum/Nodes: There are no enlarged mediastinal, hilar or axillary lymph nodes. The thyroid gland, trachea and esophagus demonstrate no significant findings. The heart size is normal. There is no pericardial effusion. There is atherosclerosis of the aorta, great vessels and coronary arteries.  Lungs/Pleura: There is no pleural effusion. Again demonstrated is severe emphysema. There are scattered calcified granulomas in the right lung. The ill-defined parenchymal density in the right upper lobe appears mildly progressive and difficult to measure given its irregular shape. It does measure approximately 3.1 x 2.3 cm on image 18. Although this has central solid components, most obvious on the reformatted images, this is still favored to reflect an inflammatory process, especially given its evolution over this short interval. No other pulmonary nodules or infiltrates are identified. Azygos fissure noted.  Upper abdomen: No suspicious findings are identified within the upper abdomen. There is no adrenal mass. Low-density lesion in the left hepatic lobe on image 55 is stable. Previous cholecystectomy.  Musculoskeletal/Chest wall: There is no chest wall mass or suspicious osseous finding. Stable degenerative changes within upper lumbar spine and posttraumatic deformity of the mid sternum.  IMPRESSION: 1. The recently demonstrated right apical lesion has not resolved and appears slightly larger. However, based on the ill-defined nature of this lesion and its evolution, an inflammatory process is still favored over atypical neoplasm. Consider tuberculosis or fungal infection. 2. Patient reportedly has no clinical evidence of infection at this time, and therefore, atypical neoplasm cannot be completely excluded. Percutaneous tissue sampling is likely to the difficult given the  patient's severe emphysema. If tissue sampling is not performed, continued CT follow-up recommended. 3. No adenopathy or new findings. Diffuse atherosclerosis.   Electronically Signed  By: Richardean Sale M.D.  On: 01/02/2015 17:01  NUCLEAR MEDICINE PET SKULL BASE TO THIGH  TECHNIQUE: 7.1 MCi F-18 FDG was injected intravenously. Full-ring PET imaging was  performed from the skull base to thigh after the radiotracer. CT data was obtained and used for attenuation correction and anatomic localization.  FASTING BLOOD GLUCOSE: Value: 96 mg/dl  COMPARISON: Chest CT 11/20/2014. PET-CT 05/11/2003.  FINDINGS: NECK  No hypermetabolic lymph nodes in the neck. Hypermetabolic activity in the region of the vocal cords, without evidence of discrete mass, presumably physiologic.  CHEST  Again noted is an ill-defined nodular density in the right upper lobe, which appears slightly larger than the prior examination, but predominantly is manifest by a ground-glass attenuation and focal architectural distortion. This lesion is irregular in shape and therefore difficult, to discretely measure but is estimated to be approximately 2.9 x 2.1 cm (image 62 of series 5), and has some peripheral areas adjacent to it which are also slightly nodular in appearance (image 63 of series 5), in the midst of some peripheral bronchiolectasis. Overall, this lesion appears slightly larger than the very recent CT scan from 11/20/2014, and although it demonstrates internal hypermetabolism (SUVmax = 5.1 centrally and 2.7 peripherally), this is strongly favored to be infectious or inflammatory in etiology. No hypermetabolic mediastinal or hilar nodes. Status post left upper lobectomy. There is a background of severe centrilobular and mild paraseptal emphysema. Small calcified granuloma in the right upper lobe is unchanged. Heart size is normal. There is no significant pericardial fluid, thickening  or pericardial calcification. There is atherosclerosis of the thoracic aorta, the great vessels of the mediastinum and the coronary arteries, including calcified atherosclerotic plaque in the left anterior descending and right coronary arteries. Several small calcified mediastinal and left hilar lymph nodes are noted. Esophagus is unremarkable in appearance.  ABDOMEN/PELVIS  No abnormal hypermetabolic activity within the liver, pancreas, adrenal glands, or spleen. No hypermetabolic lymph nodes in the abdomen or pelvis. Status post cholecystectomy. 7 mm low attenuation lesion in segment 4A of the liver is incompletely characterized, but demonstrates no hypermetabolism and is similar to the prior examination, likely a tiny cyst. 12 mm low-attenuation (-10 HU) left adrenal nodule, compatible with an adrenal adenoma. No significant volume of ascites. No pneumoperitoneum. No pathologic distention of small bowel. Uterus and ovaries are unremarkable in appearance.  SKELETON  No focal hypermetabolic activity to suggest skeletal metastasis.  IMPRESSION: 1. The lesion of concern in the upper pole of the right kidney has slightly increased in size compared to the recent prior examination and demonstrates hypermetabolism both centrally and peripherally. These imaging features are most suggestive of infection/inflammation. An empiric trial of antibiotics is suggested, followed by repeat noncontrast CT of the chest in 3-4 weeks to ensure the resolution of this finding. Although neoplasm can have this appearance, typically the appearance would be seen in the setting of adenocarcinoma which is a very slow-growing lesion that tends to be only minimally hypermetabolic (which is not characteristic of the behavior of this lesion at this time). 2. Severe centrilobular emphysema. 3. Status post left upper lobectomy. 4. Atherosclerosis, including 2 vessel coronary artery disease. Please note  that although the presence of coronary artery calcium documents the presence of coronary artery disease, the severity of this disease and any potential stenosis cannot be assessed on this non-gated CT examination. Assessment for potential risk factor modification, dietary therapy or pharmacologic therapy may be warranted, if clinically indicated. 5. Additional incidental findings, as above.  Electronically Signed: By: Vinnie Langton M.D. On: 12/04/2014 13:52   Pulmonary function testing FVC= 3.60(107%), 3.68 postbronchodilator FEV1= 2.82(70%), 1.87 postbronchodilator FEV1/FVC= 51% DLCO= 40% predicted  I personally reviewed  the CT and PET/CT images and concur with the findings above  Impression: 64 year old woman with a history of for stage IA lung cancer 11 years ago as well as a history of heavy tobacco abuse (quit in 2010) and COPD who has an irregular nodule in the right upper lobe. While the possibility of a cancer cannot be ruled out, the radiographic appearance is more consistent with an inflammatory or infectious etiology.  I had a long discussion with Tammy Eaton regarding our options for diagnosis and treatment. I discussed the relative advantages and disadvantages of each of those approaches with her. One option would be continued radiographic follow-up. A second option would be navigational bronchoscopy for tissue sampling. Another possibility would be to treat with SBR T either with or without a diagnosis. And finally, surgical resection is an option. Of these a wedge resection would be the most definitive, but also would carry significant amount of risk given the severity of her emphysema. She is very reluctant to consider surgery given pain issues related to her previous operation.  My recommendation was that we attempt to do a navigational bronchoscopy for tissue sampling. I described the proposed procedure to her. She understands this would be done in the operating room  under general anesthesia with the expectation for an outpatient procedure. She understands that no guarantee can be given that we will be able to make a definitive diagnosis. She also understands that we might not be able to rule out cancer. I do think it is a reasonable first approach particularly given that the area in question appears more inflammatory radiographically and the high risk associated with surgery in her case. She understands that there is significant risk for failure to make a diagnosis and pneumothorax. She understands that if a pneumothorax occur she would likely require a chest tube and might need surgery.  Plan: Electromagnetic navigational bronchoscopy on Monday, 01/15/2015  I spent 60 minutes face-to-face with Tammy Eaton during this visit, > 50% was spent in counseling  [name], MD [facility] [phone]

## 2015-01-16 ENCOUNTER — Encounter (HOSPITAL_COMMUNITY): Payer: Self-pay | Admitting: Thoracic Surgery (Cardiothoracic Vascular Surgery)

## 2015-01-16 NOTE — Op Note (Signed)
Tammy Eaton, ETHRIDGE NO.:  [id number]  MEDICAL RECORD NO.:  [id number]  LOCATION:  [facility]                         FACILITY:  [facility]  PHYSICIAN:  Revonda Standard. Roxan Hockey, M.D.DATE OF BIRTH:  1950/11/01  DATE OF PROCEDURE:  01/15/2015 DATE OF DISCHARGE:                              OPERATIVE REPORT   PREOPERATIVE DIAGNOSIS:  Right upper lobe nodule.  POSTOPERATIVE DIAGNOSIS:  Right upper lobe nodule.  PROCEDURES:  Electromagnetic navigational bronchoscopy with needle aspirations, brushings, biopsies, GenCut biopsy aspirations and bronchoalveolar lavage.  SURGEON:  Revonda Standard. Roxan Hockey, M.D.  ASSISTANT:  None.  ANESTHESIA:  General.  FINDINGS:  Navigated to within 1 cm of the right upper lobe nodule with good alignment.  Samples taken with fluoroscopic visualization, initial needle aspirations and brushings were bloody with few cells.  CLINICAL NOTE:  Tammy Eaton is a 64 year old woman with history of a left upper lobectomy for lung cancer in 2005, tobacco abuse and COPD.  She recently had a CT of the chest, which showed a new right upper lobe opacity in the background of severe emphysematous changes.  PET-CT showed this was hypermetabolic, but the official report suggested possible infectious or inflammatory source.  The patient was advised to undergo navigational bronchoscopy for sampling to attempt to determine the underlying etiology of the nodule.  The indications, risks, benefits, alternatives, and limitations of the procedure were discussed in detail with the patient.  She understood and accepted the risks and wished to proceed.  OPERATIVE NOTE:  Tammy Eaton was brought to the operating room on January 15, 2015, and had induction of general anesthesia.  She was intubated.  Sequential compression devices were placed on the calves for DVT prophylaxis.  After performing a surgical time-out, flexible fiberoptic bronchoscopy was performed.  The left upper  lobe stump was well healed.  The remainder of the bronchial tree was normal with no evidence of masses seen to the level of the subsegmental bronchi.  The locatable guide for navigation then was advanced through the scope and registration was performed.  There was good correlation of the video and virtual bronchoscopy.  The locatable guide then was advanced to within 1 cm of the right upper lobe nodule.  The locatable guide was removed and the sheath was left in place.  Sampling was done through the sheath. Needle aspirations were performed first with part of the specimen being placed onto slides for immediate evaluation.  The remainder being placed into cytologic preparation fluid.  Four needle aspirations were performed.  Next, the needle brush was used to obtain brushings and five brushings were taken, these were sent to Pathology.  The GenCut aspiration biopsy device then was used to obtain multiple samples and then additional biopsies were taken.  The initial slides showed bloody preparations with few viable cells seen.  The catheter was repositioned again within a cm of the nodule, but with a slightly different orientation.  Additional brushings and biopsies were obtained from this area.  Bronchoalveolar lavage then was performed twice with one specimen being sent for AFB and fungal cultures and the remainder being sent for pathology.  The sheath was removed.  A final inspection was made  with the bronchoscope and there was no ongoing bleeding.  The bronchoscope was removed.  The patient was extubated in the operating room and taken to the postanesthetic care unit in good condition.     Revonda Standard Roxan Hockey, M.D.     SCH/MEDQ  D:  01/15/2015  T:  01/16/2015  Job:  [id number]

## 2015-01-18 ENCOUNTER — Ambulatory Visit: Payer: 59 | Admitting: Thoracic Surgery (Cardiothoracic Vascular Surgery)

## 2015-01-18 LAB — CULTURE, RESPIRATORY W GRAM STAIN: Culture: NORMAL

## 2015-01-18 LAB — TISSUE CULTURE
CULTURE: NO GROWTH
GRAM STAIN: NONE SEEN

## 2015-01-18 LAB — CULTURE, RESPIRATORY

## 2015-01-19 ENCOUNTER — Encounter: Payer: Self-pay | Admitting: Thoracic Surgery (Cardiothoracic Vascular Surgery)

## 2015-01-19 ENCOUNTER — Ambulatory Visit (INDEPENDENT_AMBULATORY_CARE_PROVIDER_SITE_OTHER): Payer: 59 | Admitting: Thoracic Surgery (Cardiothoracic Vascular Surgery)

## 2015-01-19 VITALS — BP 120/67 | HR 83 | Resp 16 | Ht 66.0 in | Wt 143.0 lb

## 2015-01-19 DIAGNOSIS — D381 Neoplasm of uncertain behavior of trachea, bronchus and lung: Secondary | ICD-10-CM | POA: Diagnosis not present

## 2015-01-19 NOTE — Progress Notes (Signed)
[address]       [place] [address]             [phone]       HPI: Tammy Eaton returns to discuss the results of her navigational bronchoscopy.  She is a 64 year old woman with a history of tobacco abuse, COPD, and stage IA lung cancer treated with a left upper lobectomy in 2005. She had a CT scan in October which showed a new right upper lobe opacity. A PET CT was done which showed lesion was hypermetabolic. The official radiology report indicates that this is suspicious for an infectious or inflammatory nodule. She then had a repeat CT done at the end of November which showed the nodule had increased in size.  I saw our couple weeks ago discussed various options. After reviewing the pros and cons of those approaches she wished to proceed with a navigational bronchoscopy for biopsies and cultures. I navigational bronchoscopy on 01/15/2015. Multiple brushings, biopsies, needle aspirations and bronchoalveolar lavage were performed. She tolerated the procedure well. She now returns to discuss the results.  Past Medical History  Diagnosis Date  . Arthritis   . COPD (chronic obstructive pulmonary disease) ([place])   . Emphysema of lung ([place])   . Hyperlipidemia   . Shortness of breath   . Wears glasses   . lung ca 05/2003  . Depression   . Reflux esophagitis   . Lung cancer ([place])     s/p LUL lobectomy  . Hyperlipidemia   . Osteoarthritis     spine, knees, hands  . Rhinitis   . PONV (postoperative nausea and vomiting)     pt "could not eat during hospital stay"  . Complication of anesthesia     "low blood pressure after retinal detachment surgery"  . Anxiety   . History of pneumonia     "multiple times"  . History of bronchitis       Current Outpatient Prescriptions  Medication Sig Dispense Refill  . acetaminophen (TYLENOL) 325 MG tablet Take 650 mg by mouth every 6 (six) hours as needed.    Marland Kitchen albuterol (PROVENTIL HFA;VENTOLIN HFA) 108 (90 BASE)  MCG/ACT inhaler Inhale 2 puffs into the lungs every 6 (six) hours as needed for wheezing or shortness of breath.    . ALPRAZolam (XANAX) 0.5 MG tablet Take 0.5 mg by mouth 3 (three) times daily as needed. For anxiety    . cetirizine (ZYRTEC) 10 MG tablet Take 10 mg by mouth daily.    . cholecalciferol (VITAMIN D) 1000 UNITS tablet Take 1,000 Units by mouth daily.    . CYMBALTA 60 MG capsule Take 60 mg by mouth daily.     . fish oil-omega-3 fatty acids 1000 MG capsule Take 1 g by mouth 3 (three) times daily.     . Fluticasone Furoate-Vilanterol (BREO ELLIPTA) 100-25 MCG/INH AEPB Inhale 1 puff into the lungs daily.     . Glucosamine Sulfate 750 MG TABS Take 750 mg by mouth 2 (two) times daily.    Tammy Eaton 2 MG TABS Take 2 mg by mouth at bedtime.     . Magnesium 500 MG CAPS Take 500 mg by mouth daily.     . pseudoephedrine (SUDAFED) 30 MG tablet Take 30 mg by mouth every 4 (four) hours as needed for congestion.    . Vitamin D, Ergocalciferol, (DRISDOL) 50000 UNITS CAPS capsule Take 1 capsule (50,000 Units total) by mouth every 14 (fourteen) days. [place]  capsule 1   No current facility-administered medications for this visit.    Physical Exam BP 120/67 mmHg  Pulse 83  Resp 16  Ht '5\' 6"'$  (1.676 m)  Wt 143 lb (64.864 kg)  BMI 23.09 kg/m2  SpO2 95%  LMP 09/26/2013 64 year old woman in no acute distress Well-developed well-nourished  Diagnostic Tests: Diagnosis Lung, biopsy, Right upper lobe - BENIGN LUNG TISSUE, SEE COMMENT. - NEGATIVE FOR ATYPIA AND MALIGNANCY. Microscopic Comment Slide sections demonstrate benign lung tissue with areas of hyalinized and fibro-elastotic parenchyma associated with chronic lymphohistiocytic inflammation and anthracotic pigment deposition. Definitive features of malignancy are not present. TTF-1, cytokeratin 5/6, and CD56 immunostains were used in the diagnostic workup of the case. The case was reviewed with Dr. Gari Crown who concurs. Please see the corresponding  cytology specimens. (CRR:ds 01/17/15) Mali RUND DO Pathologist, Electronic Signature (Case signed 01/17/2015) Specimen Gross and Clinical Information AFB, fungal, and bacterial cultures- stains negative, AFB and fungal cultures in progress  Impression: 64 year old woman with a history of a stage IA lung cancer and treated with a left upper lobectomy 11 years ago. She now has a right upper lobe nodule that is irregular. Radiology thinks this mass is suspicious for an infectious or inflammatory mass. I did a navigational bronchoscopy on her. Biopsy showed only hyalinized and fibroelastic parenchyma with chronic lymphohistiocytic inflammation. No malignancy was seen. Her initial stains for AFB and fungal organisms were negative and the cultures are in progress.  I reviewed the pathology with Mr. and Mrs. Savich. They understand that the absence of malignant cells on these biopsies does not rule out the possibility of cancer. However the absence of malignant cells and the appearance on CT do suggest this may just be infectious or inflammatory.  We discussed the options of radiation therapy without a diagnosis of malignancy versus surgery versus continued radiographic follow-up.   My recommendation to her was that we wait until we have the final AFB and fungal culture results which takes about 6 weeks. That would be 8 weeks from her last CT scan. We would then plan to do another CT, and make a decision at that point. After answering their questions she her husband would like to take that approach.   Plan: Return in 6 weeks with CT of chest  I spent 15 minutes with Mr. and Mrs. Kimpel reviewing the results and discussing options. Greater than 50% of the time was spent in counseling. [name], MD [facility] [phone]

## 2015-01-22 ENCOUNTER — Other Ambulatory Visit: Payer: Self-pay | Admitting: *Deleted

## 2015-01-22 DIAGNOSIS — R911 Solitary pulmonary nodule: Secondary | ICD-10-CM

## 2015-02-08 LAB — FUNGUS CULTURE W SMEAR: Fungal Smear: NONE SEEN

## 2015-02-12 LAB — FUNGUS CULTURE W SMEAR: Fungal Smear: NONE SEEN

## 2015-02-22 ENCOUNTER — Ambulatory Visit: Payer: 59 | Admitting: Nurse Practitioner

## 2015-02-23 ENCOUNTER — Ambulatory Visit (INDEPENDENT_AMBULATORY_CARE_PROVIDER_SITE_OTHER): Payer: Medicare Other | Admitting: Nurse Practitioner

## 2015-02-23 ENCOUNTER — Encounter: Payer: Self-pay | Admitting: Nurse Practitioner

## 2015-02-23 VITALS — BP 118/74 | HR 66 | Ht 66.5 in | Wt 147.0 lb

## 2015-02-23 DIAGNOSIS — E559 Vitamin D deficiency, unspecified: Secondary | ICD-10-CM | POA: Diagnosis not present

## 2015-02-23 DIAGNOSIS — Z0000 Encounter for general adult medical examination without abnormal findings: Secondary | ICD-10-CM

## 2015-02-23 DIAGNOSIS — Z01419 Encounter for gynecological examination (general) (routine) without abnormal findings: Secondary | ICD-10-CM | POA: Diagnosis not present

## 2015-02-23 MED ORDER — VITAMIN D (ERGOCALCIFEROL) 1.25 MG (50000 UNIT) PO CAPS: 50000.0000 [IU] | ORAL_CAPSULE | ORAL | Status: DC

## 2015-02-23 NOTE — Progress Notes (Signed)
Patient ID: Tammy Eaton, female   DOB: [birth year], 65 y.o.   MRN: [id number]  65 y.o. G7P2002 Married  Caucasian Fe here for annual exam.  New area showed up on CXR and multiple studies have been done - none are conclusive.  Going to see surgeon and decide if removal of right upper lobe is necessary.  Patient's last menstrual period was 09/26/2013 (exact date).          Sexually active: No.  The current method of family planning is none.    Exercising: No.  The patient does not participate in regular exercise at present. Smoker:  no  Health Maintenance: Pap:  02/20/14, Negative with neg HR HPV MMG: 05/22/14, Bi-Rads: 1: Negative Colonoscopy: 06/07/14, polyp, repeat in 5 years BMD: 03/11/10, T Score -1.4 Spine / -1.2 Left Femur Neck TDaP:  02/2011 Shingles: 02/2011 Pneumonia: 02/22/2015 Hep C and HIV: will do today Labs: in EPIC, we follow Vit D   reports that she quit smoking about 6 years ago. She does not have any smokeless tobacco history on file. She reports that she drinks alcohol. She reports that she does not use illicit drugs.  Past Medical History  Diagnosis Date  . Arthritis   . COPD (chronic obstructive pulmonary disease) ([place])   . Emphysema of lung ([place])   . Hyperlipidemia   . Shortness of breath   . Wears glasses   . lung ca 05/2003  . Depression   . Reflux esophagitis   . Lung cancer ([place])     s/p LUL lobectomy  . Hyperlipidemia   . Osteoarthritis     spine, knees, hands  . Rhinitis   . PONV (postoperative nausea and vomiting)     pt "could not eat during hospital stay"  . Complication of anesthesia     "low blood pressure after retinal detachment surgery"  . Anxiety   . History of pneumonia     "multiple times"  . History of bronchitis     Past Surgical History  Procedure Laterality Date  . Lung lobectomy Left 4/05  . Sinus surgery with instatrak    . Tubal ligation    . Dilation and curettage of uterus    . Vocal cord lateralization, endoscopic approach  w/ mlb      nodule removed  . Knee surgery      rt knee torn cartilage  . Cystoscopy  12/10    for evaluation of hematuria  . Breast surgery  1992    lt br bx  . Eye surgery      catataracts-both  . Retinal detachment surgery  2011    left; x2  . Cholecystectomy  11/11/2011    Procedure: LAPAROSCOPIC CHOLECYSTECTOMY WITH INTRAOPERATIVE CHOLANGIOGRAM;  Surgeon: Odis Hollingshead, MD;  Location: [facility];  Service: General;  Laterality: N/A;  . Breast lumpectomy with needle localization Right 04/11/2013    Procedure: BREAST LUMPECTOMY WITH NEEDLE LOCALIZATION;  Surgeon: Odis Hollingshead, MD;  Location: [facility];  Service: General;  Laterality: Right;  . Colonoscopy    . Esophagogastroduodenoscopy    . Video bronchoscopy with endobronchial navigation N/A 01/15/2015    Procedure: VIDEO BRONCHOSCOPY WITH ENDOBRONCHIAL NAVIGATION;  Surgeon: [name], MD;  Location: [facility];  Service: Thoracic;  Laterality: N/A;    Current Outpatient Prescriptions  Medication Sig Dispense Refill  . acetaminophen (TYLENOL) 325 MG tablet Take 650 mg by mouth every 6 (six) hours as needed.    Marland Kitchen albuterol (  PROVENTIL HFA;VENTOLIN HFA) 108 (90 BASE) MCG/ACT inhaler Inhale 2 puffs into the lungs every 6 (six) hours as needed for wheezing or shortness of breath.    . ALPRAZolam (XANAX) 0.5 MG tablet Take 0.5 mg by mouth 3 (three) times daily as needed. For anxiety    . cetirizine (ZYRTEC) 10 MG tablet Take 10 mg by mouth daily.    . cholecalciferol (VITAMIN D) 1000 UNITS tablet Take 1,000 Units by mouth daily.    . CYMBALTA 60 MG capsule Take 60 mg by mouth daily.     . fish oil-omega-3 fatty acids 1000 MG capsule Take 1 g by mouth 3 (three) times daily.     . Fluticasone Furoate-Vilanterol (BREO ELLIPTA) 100-25 MCG/INH AEPB Inhale 1 puff into the lungs daily.     . Glucosamine Sulfate 750 MG TABS Take 750 mg by mouth 2 (two) times daily.    . Magnesium 500 MG CAPS Take 500 mg by mouth daily.      . meloxicam (MOBIC) 15 MG tablet Take 1 tablet by mouth daily as needed.  3  . pseudoephedrine (SUDAFED) 30 MG tablet Take 30 mg by mouth every 4 (four) hours as needed for congestion.    . Vitamin D, Ergocalciferol, (DRISDOL) 50000 units CAPS capsule Take 1 capsule (50,000 Units total) by mouth every 14 (fourteen) days. 30 capsule 3   No current facility-administered medications for this visit.    Family History  Problem Relation Age of Onset  . Cancer Mother 29    ovarian cancer  . Hypertension Mother   . Diabetes Mother   . Alcohol abuse Father     ROS:  Pertinent items are noted in HPI.  Otherwise, a comprehensive ROS was negative.  Exam:   BP 118/74 mmHg  Pulse 66  Ht 5' 6.5" (1.689 m)  Wt 147 lb (66.679 kg)  BMI 23.37 kg/m2  LMP 09/26/2013 (Exact Date) Height: 5' 6.5" (168.9 cm) Ht Readings from Last 3 Encounters:  02/23/15 5' 6.5" (1.689 m)  01/19/15 '5\' 6"'$  (1.676 m)  01/11/15 '5\' 6"'$  (1.676 m)    General appearance: alert, cooperative and appears stated age Head: Normocephalic, without obvious abnormality, atraumatic Neck: no adenopathy, supple, symmetrical, trachea midline and thyroid normal to inspection and palpation Lungs: clear to auscultation bilaterally Breasts: normal appearance, no masses or tenderness Heart: regular rate and rhythm Abdomen: soft, non-tender; no masses,  no organomegaly Extremities: extremities normal, atraumatic, no cyanosis or edema Skin: Skin color, texture, turgor normal. No rashes or lesions Lymph nodes: Cervical, supraclavicular, and axillary nodes normal. No abnormal inguinal nodes palpated Neurologic: Grossly normal   Pelvic: External genitalia:  no lesions              Urethra:  normal appearing urethra with no masses, tenderness or lesions              Bartholin's and Skene's: normal                 Vagina: normal appearing vagina with normal color and discharge, no lesions              Cervix: anteverted              Pap  taken: No. Bimanual Exam:  Uterus:  normal size, contour, position, consistency, mobility, non-tender              Adnexa: no mass, fullness, tenderness  Rectovaginal: Confirms               Anus:  normal sphincter tone, no lesions  Chaperone present: no  A:  Well Woman with normal exam  Postmenopausal off HRT 1995 - 12/02 S/P LUL Lobectomy for Lung cancer stage I A- 05/2003, followed by Oncology Vit D deficiency PMB with negative endo biopsy and PUS 10/29 & 12/21/13 S/P right breast lumpectomy 04/11/13 with intraductal papilloma with atypical ductal hyperplasia with wide excision  01/2015 New right lung nodule with recent test (Bronchoscopy, tissue biopsy, AFB culture)  inconclusive - may need surgical removal - meeting with surgeon to decide   P:   Reviewed health and wellness pertinent to exam  Pap smear as above  Mammogram is due 05/2015  Refill Vit D and follow wit labs.  Follow with other labs as well  Counseled on breast self exam, mammography screening, adequate intake of calcium and vitamin D, diet and exercise, Kegel's exercises return annually or prn  An After Visit Summary was printed and given to the patient.

## 2015-02-23 NOTE — Patient Instructions (Signed)

## 2015-02-24 LAB — HEPATITIS C ANTIBODY: HCV AB: NEGATIVE

## 2015-02-24 LAB — HIV ANTIBODY (ROUTINE TESTING W REFLEX): HIV: NONREACTIVE

## 2015-02-24 LAB — VITAMIN D 25 HYDROXY (VIT D DEFICIENCY, FRACTURES): Vit D, 25-Hydroxy: 55 ng/mL (ref 30–100)

## 2015-02-25 NOTE — Progress Notes (Signed)
Encounter reviewed by Dr. Ernestine Langworthy Amundson C. Silva.  

## 2015-02-27 LAB — AFB CULTURE WITH SMEAR (NOT AT ARMC): Acid Fast Smear: NONE SEEN

## 2015-02-28 LAB — AFB CULTURE WITH SMEAR (NOT AT ARMC): ACID FAST SMEAR: NONE SEEN

## 2015-03-06 ENCOUNTER — Ambulatory Visit (INDEPENDENT_AMBULATORY_CARE_PROVIDER_SITE_OTHER): Payer: Medicare Other | Admitting: Thoracic Surgery (Cardiothoracic Vascular Surgery)

## 2015-03-06 ENCOUNTER — Ambulatory Visit
Admission: RE | Admit: 2015-03-06 | Discharge: 2015-03-06 | Disposition: A | Discharge status: Home / Self Care | Payer: Medicare Other | Source: Ambulatory Visit | Attending: Thoracic Surgery (Cardiothoracic Vascular Surgery) | Admitting: Thoracic Surgery (Cardiothoracic Vascular Surgery)

## 2015-03-06 ENCOUNTER — Encounter: Payer: Self-pay | Admitting: Thoracic Surgery (Cardiothoracic Vascular Surgery)

## 2015-03-06 VITALS — BP 113/68 | HR 87 | Resp 16 | Ht 65.5 in | Wt 147.0 lb

## 2015-03-06 DIAGNOSIS — D381 Neoplasm of uncertain behavior of trachea, bronchus and lung: Secondary | ICD-10-CM | POA: Diagnosis not present

## 2015-03-06 DIAGNOSIS — Z85118 Personal history of other malignant neoplasm of bronchus and lung: Secondary | ICD-10-CM

## 2015-03-06 DIAGNOSIS — R911 Solitary pulmonary nodule: Secondary | ICD-10-CM

## 2015-03-06 NOTE — Progress Notes (Signed)
[address]       [place] [address]             [phone]       HPI: Mrs. Tammy Eaton turns to discuss the results of her CT chest.  She is a 65 year old woman with a history of tobacco abuse (quit in 1020), COPD, and stage IA lung cancer treated with a left upper lobectomy in 2005. She had a CT scan in October which showed a new right upper lobe opacity. A PET CT was done which showed the lesion was hypermetabolic. The official radiology report indicates that this is suspicious for an infectious or inflammatory nodule. She then had a repeat CT done at the end of November which showed the nodule had increased in size.  She wished to proceed with a navigational bronchoscopy so I did that on 01/15/2015. Biopsy showed inflammation. AFB fungal stains and cultures were negative.  I recommended that we do a short interval follow-up at 6 weeks. She now returns for that.  In the interim since her last visit she has started taking meloxicam for hip pain. She has had dramatic improvement in her arthritis symptoms. She has not had any fevers, chills, or sweats. She denies productive cough.  Past Medical History  Diagnosis Date  . Arthritis   . COPD (chronic obstructive pulmonary disease) ([place])   . Emphysema of lung ([place])   . Hyperlipidemia   . Shortness of breath   . Wears glasses   . lung ca 05/2003  . Depression   . Reflux esophagitis   . Lung cancer ([place])     s/p LUL lobectomy  . Hyperlipidemia   . Osteoarthritis     spine, knees, hands  . Rhinitis   . PONV (postoperative nausea and vomiting)     pt "could not eat during hospital stay"  . Complication of anesthesia     "low blood pressure after retinal detachment surgery"  . Anxiety   . History of pneumonia     "multiple times"  . History of bronchitis       Current Outpatient Prescriptions  Medication Sig Dispense Refill  . acetaminophen (TYLENOL) 325 MG tablet Take 650 mg by mouth every 6 (six) hours  as needed.    Marland Kitchen albuterol (PROVENTIL HFA;VENTOLIN HFA) 108 (90 BASE) MCG/ACT inhaler Inhale 2 puffs into the lungs every 6 (six) hours as needed for wheezing or shortness of breath.    . ALPRAZolam (XANAX) 0.5 MG tablet Take 0.5 mg by mouth 3 (three) times daily as needed. For anxiety    . cetirizine (ZYRTEC) 10 MG tablet Take 10 mg by mouth daily.    . cholecalciferol (VITAMIN D) 1000 UNITS tablet Take 1,000 Units by mouth daily.    . CYMBALTA 60 MG capsule Take 60 mg by mouth daily.     . fish oil-omega-3 fatty acids 1000 MG capsule Take 1 g by mouth 3 (three) times daily.     . Fluticasone Furoate-Vilanterol (BREO ELLIPTA) 100-25 MCG/INH AEPB Inhale 1 puff into the lungs daily.     . Glucosamine Sulfate 750 MG TABS Take 750 mg by mouth 2 (two) times daily.    . Magnesium 500 MG CAPS Take 500 mg by mouth daily.     . meloxicam (MOBIC) 15 MG tablet Take 1 tablet by mouth daily as needed.  3  . pseudoephedrine (SUDAFED) 30 MG tablet Take 30 mg by mouth every 4 (four) hours as  needed for congestion.    . Vitamin D, Ergocalciferol, (DRISDOL) 50000 units CAPS capsule Take 1 capsule (50,000 Units total) by mouth every 14 (fourteen) days. 30 capsule 3   No current facility-administered medications for this visit.    Physical Exam BP 113/68 mmHg  Pulse 87  Resp 16  Ht 5' 5.5" (1.664 m)  Wt 147 lb (66.679 kg)  BMI 24.08 kg/m2  SpO2 93%  LMP 09/26/2013 (Exact Date) 65 year old woman in no acute distress Alert and oriented 3 with no focal deficits Lungs with diminished breath sounds bilaterally, no wheezing Cardiac regular rate and rhythm S1 and S2  Diagnostic Tests: CT CHEST WITHOUT CONTRAST  TECHNIQUE: Multidetector CT imaging of the chest was performed following the standard protocol without IV contrast.  COMPARISON: Chest CT 01/02/2015. PET-CT 12/04/2014.  FINDINGS: Mediastinum/Nodes: There are no enlarged mediastinal, hilar or axillary lymph nodes. The heart size is  normal. There is no pericardial effusion. Atherosclerosis of the aorta, great vessels and coronary arteries again noted.  Lungs/Pleura: There is no pleural effusion. Severe emphysematous changes are again noted status post left upper lobe resection. The irregular right upper lobe lesion remains difficult to measure given its irregular shape, although demonstrates interval retraction, most consistent with a resolving inflammatory process. Scattered calcified granulomas are present bilaterally. There are no new or enlarging pulmonary nodules.  Upper abdomen: The visualized upper abdomen appears stable without suspicious findings. There is a stable low-density lesion in the left hepatic lobe.  Musculoskeletal/Chest wall: There is no chest wall mass or suspicious osseous finding. Endplate degenerative changes in the upper lumbar spine and posttraumatic deformity of the mid sternum are unchanged.  IMPRESSION: 1. Interval retraction of irregular right upper lobe density, remaining most consistent with an evolving inflammatory process. Continued CT follow-up recommended. 2. No worrisome pulmonary nodule, adenopathy or acute findings demonstrated. 3. Severe emphysema and atherosclerosis again noted.   Electronically Signed  By: Richardean Sale M.D.  On: 03/06/2015 12:46 I personally reviewed the CT chest in her was advised as noted above  Impression: 65 year old woman with a remote history of stage IA lung cancer, heavy tobacco abuse, and COPD who has an opacity in the right upper lobe. This is in the setting of severe surrounding emphysema. Biopsies showed inflammation, which is consistent with the findings on CT and PET/CT. AFB and fungal stains and cultures were negative.  She has been feeling better in the interim since her last visit. She attributes this to meloxicam.  Her CT today shows some evolution of the opacity in the right upper lobe with some retraction and a  decrease in size of the more nodular component. This again is more consistent with an infectious/inflammatory process than neoplasm. I do think this area requires continued follow-up until stability. I recommended we do another CT in 3 months.  Plan: Return in 3 months with CT chest  [name], MD [facility] [phone]

## 2015-04-04 ENCOUNTER — Telehealth: Payer: Self-pay | Admitting: Hematology

## 2015-04-04 ENCOUNTER — Encounter: Payer: Self-pay | Admitting: Hematology

## 2015-04-04 ENCOUNTER — Ambulatory Visit (HOSPITAL_BASED_OUTPATIENT_CLINIC_OR_DEPARTMENT_OTHER): Payer: Medicare Other | Admitting: Hematology

## 2015-04-04 VITALS — BP 109/56 | HR 85 | Temp 98.4°F | Resp 18 | Wt 145.6 lb

## 2015-04-04 DIAGNOSIS — Z85118 Personal history of other malignant neoplasm of bronchus and lung: Secondary | ICD-10-CM

## 2015-04-04 DIAGNOSIS — R911 Solitary pulmonary nodule: Secondary | ICD-10-CM | POA: Diagnosis not present

## 2015-04-04 NOTE — Telephone Encounter (Signed)
Pt confirmed appt and received the avs

## 2015-04-04 NOTE — Progress Notes (Signed)
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  INTERVAL HISTORY:  Tammy Eaton is here for f/u of her right upper lobe lung nodule and the results of her recent interval CT chest.  She had initially presented with a right upper lobe lesion that had shown some initial increase in size after being empirically treated with antibiotics and steroids. She was subsequently seen by Dr. Jasmine Pang and had a navigational bronchoscopy which showed no evidence of lung cancer. On her follow-up CT scan on 03/06/2015 this lesion appears to have reduced in size. Patient notes that she's had some low back pain due to arthritis and has been on meloxicam which she feels might have been useful as an anti-inflammatory medication. No other new focal symptoms. Notes that the meloxicam has certainly helped a lot with her arthritis pain and she is having a good quality of life. She is looking forward to seeing her grandchild in April and attending her son's graduation in May this year.   MEDICAL HISTORY:  Past Medical History  Diagnosis Date  . Arthritis   . COPD (chronic obstructive pulmonary disease) ([place])   . Emphysema of lung ([place])   . Hyperlipidemia   . Shortness of breath   . Wears glasses   . lung ca 05/2003  . Depression   . Reflux esophagitis   . Lung cancer ([place])     s/p LUL lobectomy  . Hyperlipidemia   . Osteoarthritis     spine, knees, hands  . Rhinitis   . PONV (postoperative nausea and vomiting)     pt "could not eat  during hospital stay"  . Complication of anesthesia     "low blood pressure after retinal detachment surgery"  . Anxiety   . History of pneumonia     "multiple times"  . History of bronchitis     SURGICAL HISTORY: Past Surgical History  Procedure Laterality Date  . Lung lobectomy Left 4/05  . Sinus surgery with instatrak    . Tubal ligation    . Dilation and curettage of uterus    . Vocal cord lateralization, endoscopic approach w/ mlb      nodule removed  . Knee surgery      rt knee torn cartilage  . Cystoscopy  12/10    for evaluation of hematuria  . Breast surgery  1992    lt br bx  . Eye surgery      catataracts-both  . Retinal detachment surgery  2011    left; x2  . Cholecystectomy  11/11/2011    Procedure: LAPAROSCOPIC CHOLECYSTECTOMY WITH INTRAOPERATIVE CHOLANGIOGRAM;  Surgeon: Odis Hollingshead, MD;  Location: [facility];  Service: General;  Laterality: N/A;  . Breast lumpectomy with needle localization Right 04/11/2013    Procedure: BREAST LUMPECTOMY WITH NEEDLE LOCALIZATION;  Surgeon: Odis Hollingshead, MD;  Location: [facility];  Service: General;  Laterality: Right;  . Colonoscopy    . Esophagogastroduodenoscopy    . Video bronchoscopy with endobronchial navigation N/A 01/15/2015    Procedure: VIDEO BRONCHOSCOPY WITH ENDOBRONCHIAL  NAVIGATION;  Surgeon: [name], MD;  Location: [facility];  Service: Thoracic;  Laterality: N/A;    SOCIAL HISTORY: Social History   Social History  . Marital Status: Married    Spouse Name: N/A  . Number of Children: 2  . Years of Education: N/A   Occupational History  . Retired    Social History Main Topics  . Smoking status: Former Smoker -- 2.00 packs/day for 40 years    Quit date: 09/21/2008  . Smokeless tobacco: Not on file  . Alcohol Use: Yes     Comment: SOCIALLY  . Drug Use: No  . Sexual Activity: No   Other Topics Concern  . Not on file   Social History Narrative    FAMILY HISTORY: Family  History  Problem Relation Age of Onset  . Cancer Mother 98    ovarian cancer  . Hypertension Mother   . Diabetes Mother   . Alcohol abuse Father     ALLERGIES:  is allergic to adhesive; amoxicillin; septra; and oxycodone.  MEDICATIONS:  Current Outpatient Prescriptions  Medication Sig Dispense Refill  . acetaminophen (TYLENOL) 325 MG tablet Take 650 mg by mouth every 6 (six) hours as needed.    Marland Kitchen albuterol (PROVENTIL HFA;VENTOLIN HFA) 108 (90 BASE) MCG/ACT inhaler Inhale 2 puffs into the lungs every 6 (six) hours as needed for wheezing or shortness of breath.    . ALPRAZolam (XANAX) 0.5 MG tablet Take 0.5 mg by mouth 3 (three) times daily as needed. For anxiety    . cetirizine (ZYRTEC) 10 MG tablet Take 10 mg by mouth daily.    . cholecalciferol (VITAMIN D) 1000 UNITS tablet Take 1,000 Units by mouth daily.    . CYMBALTA 60 MG capsule Take 60 mg by mouth daily.     . Fluticasone Furoate-Vilanterol (BREO ELLIPTA) 100-25 MCG/INH AEPB Inhale 1 puff into the lungs daily.     . meloxicam (MOBIC) 15 MG tablet Take 1 tablet by mouth daily as needed.  3  . pseudoephedrine (SUDAFED) 30 MG tablet Take 30 mg by mouth every 4 (four) hours as needed for congestion.    . Vitamin D, Ergocalciferol, (DRISDOL) 50000 units CAPS capsule Take 1 capsule (50,000 Units total) by mouth every 14 (fourteen) days. 30 capsule 3   No current facility-administered medications for this visit.    REVIEW OF SYSTEMS:    10 Point review of Systems was done is negative except as noted above.  PHYSICAL EXAMINATION: ECOG PERFORMANCE STATUS: 1 - Symptomatic but completely ambulatory  . Filed Vitals:   04/04/15 1303  BP: 109/56  Pulse: 85  Temp: 98.4 F (36.9 C)  Resp: 18   Filed Weights   04/04/15 1303  Weight: 145 lb 9.6 oz (66.044 kg)   .Body mass index is 23.85 kg/(m^2).  GENERAL:alert, in no acute distress and comfortable SKIN: skin color, texture, turgor are normal, no rashes or significant  lesions EYES: normal, conjunctiva are pink and non-injected, sclera clear OROPHARYNX:no exudate, no erythema and lips, buccal mucosa, and tongue normal  NECK: supple, no JVD, thyroid normal size, non-tender, without nodularity LYMPH:  no palpable lymphadenopathy in the cervical, axillary or inguinal LUNGS: Decreased entry bilaterally with scattered rhonchi no rales  HEART: regular rate & rhythm,  no murmurs and no lower extremity edema ABDOMEN: abdomen soft, non-tender, normoactive bowel sounds  Musculoskeletal: no cyanosis of digits and no clubbing  PSYCH: alert & oriented x 3 with fluent speech NEURO: no focal motor/sensory deficits  LABORATORY DATA:  I have reviewed the data as listed  . CBC Latest Ref Rng 01/11/2015 11/20/2014 11/24/2013  WBC 4.0 - 10.5 K/uL 5.7 5.5 5.7  Hemoglobin 12.0 - 15.0 g/dL 15.0 15.3 16.0(H)  Hematocrit 36.0 - 46.0 % 45.4 45.5 49.0(H)  Platelets 150 - 400 K/uL 282 262 265    . CMP Latest Ref Rng 01/11/2015 11/20/2014 11/24/2013  Glucose 65 - 99 mg/dL 109(H) 89 81  BUN 6 - 20 mg/dL 11 10.2 12.7  Creatinine 0.44 - 1.00 mg/dL 0.59 0.7 0.8  Sodium 135 - 145 mmol/L 141 143 144  Potassium 3.5 - 5.1 mmol/L 4.2 3.9 3.9  Chloride 101 - 111 mmol/L 107 - -  CO2 22 - 32 mmol/L '26 28 27  '$ Calcium 8.9 - 10.3 mg/dL 9.3 9.2 9.6  Total Protein 6.5 - 8.1 g/dL 6.7 6.6 7.0  Total Bilirubin 0.3 - 1.2 mg/dL 0.7 0.87 0.74  Alkaline Phos 38 - 126 U/L 81 78 86  AST 15 - 41 U/L '24 16 16  '$ ALT 14 - 54 U/L '23 17 19     '$ RADIOGRAPHIC STUDIES: I have personally reviewed the radiological images as listed and agreed with the findings in the report. Ct Chest Wo Contrast  03/06/2015  CLINICAL DATA:  Follow up pulmonary nodule. History of lung cancer with left upper lobe surgery in 2005. Benign right upper lobe biopsy 2 months ago. EXAM: CT CHEST WITHOUT CONTRAST TECHNIQUE: Multidetector CT imaging of the chest was performed following the standard protocol without IV contrast.  COMPARISON:  Chest CT 01/02/2015.  PET-CT 12/04/2014. FINDINGS: Mediastinum/Nodes: There are no enlarged mediastinal, hilar or axillary lymph nodes. The heart size is normal. There is no pericardial effusion. Atherosclerosis of the aorta, great vessels and coronary arteries again noted. Lungs/Pleura: There is no pleural effusion. Severe emphysematous changes are again noted status post left upper lobe resection. The irregular right upper lobe lesion remains difficult to measure given its irregular shape, although demonstrates interval retraction, most consistent with a resolving inflammatory process. Scattered calcified granulomas are present bilaterally. There are no new or enlarging pulmonary nodules. Upper abdomen: The visualized upper abdomen appears stable without suspicious findings. There is a stable low-density lesion in the left hepatic lobe. Musculoskeletal/Chest wall: There is no chest wall mass or suspicious osseous finding. Endplate degenerative changes in the upper lumbar spine and posttraumatic deformity of the mid sternum are unchanged. IMPRESSION: 1. Interval retraction of irregular right upper lobe density, remaining most consistent with an evolving inflammatory process. Continued CT follow-up recommended. 2. No worrisome pulmonary nodule, adenopathy or acute findings demonstrated. 3. Severe emphysema and atherosclerosis again noted. Electronically Signed   By: Richardean Sale M.D.   On: 03/06/2015 12:46    ASSESSMENT & PLAN:   65 year old Caucasian female with history of COPD and prior history of left upper lobe stage IA non-small cell lung cancer status post left upper lobectomy here for her annual follow-up. CT showed evidence of 2 cm right upper lobe opacity. PET/CT scan did not showing definite evidence of a neoplastic nodule and cannot rule out the presence of inflammation/infection causing uneven FDG avidity.  #1 history of stage IA left upper lobe non-small cell lung cancer status  post left upper lobectomy in 2005. #2 new right upper lobe lung opacity - unresolved/slightly larger on f/u CT chest. Did not improve with steroids and empiric antibiotics. TB quantiferon assay neg. subsequent navigational bronchoscopy was negative for lung cancer. Follow-up CT scan on 03/06/2015 shows interval retraction  of the irregular right upper lobe nodule with the imaging characteristics consistent with an inflammatory process. No other worrisome pulmonary nodules or adenopathy noted.  #3 history of COPD not on home oxygen severe emphysema. #4 ex-smoker quit in 2010. An 80-pack-year history of smoking. #5 atypical ductal hyperplasia Plan -Patient has no evidence of lung cancer at this time and needs no other acute intervention. -She notes that she has follow-up with Dr. Jasmine Pang in 3 months with a repeat CT scan. -We will let her have her CT as planned and will look out for the results. -Assuming that there is nothing else that is concerning on this follow-up CT scan we shall see her back in 6 months for her regular continued follow-up.  Return to care in 6 months with Dr Irene Limbo for continued f/u. Earlier if any new concerns on CT chest planned in 3 months with Dr. Leonarda Salon office.  All the patient's questions answered to her apparent satisfaction.  I spent 15 minutes counseling the patient face to face. The total time spent in the appointment was 20 minutes and more than 50% was on counseling and direct patient cares.    Sullivan Lone MD [place] AAHIVMS [facility] [address] [facility]ematology/Oncology Physician [place]  (Office):       [phone] (Work cell):  [phone] (Fax):           [phone]

## 2015-05-11 ENCOUNTER — Other Ambulatory Visit: Payer: Self-pay

## 2015-05-11 DIAGNOSIS — Z1231 Encounter for screening mammogram for malignant neoplasm of breast: Secondary | ICD-10-CM

## 2015-05-24 ENCOUNTER — Other Ambulatory Visit: Payer: Self-pay | Admitting: Thoracic Surgery (Cardiothoracic Vascular Surgery)

## 2015-05-24 DIAGNOSIS — R918 Other nonspecific abnormal finding of lung field: Secondary | ICD-10-CM

## 2015-05-29 ENCOUNTER — Ambulatory Visit: Payer: Medicare Other

## 2015-05-30 ENCOUNTER — Ambulatory Visit: Payer: Medicare Other

## 2015-05-31 ENCOUNTER — Ambulatory Visit
Admission: RE | Admit: 2015-05-31 | Discharge: 2015-05-31 | Disposition: A | Discharge status: Home / Self Care | Payer: Medicare Other | Source: Ambulatory Visit

## 2015-05-31 DIAGNOSIS — Z1231 Encounter for screening mammogram for malignant neoplasm of breast: Secondary | ICD-10-CM

## 2015-06-26 ENCOUNTER — Ambulatory Visit
Admission: RE | Admit: 2015-06-26 | Discharge: 2015-06-26 | Disposition: A | Discharge status: Home / Self Care | Payer: Medicare Other | Source: Ambulatory Visit | Attending: Thoracic Surgery (Cardiothoracic Vascular Surgery) | Admitting: Thoracic Surgery (Cardiothoracic Vascular Surgery)

## 2015-06-26 ENCOUNTER — Ambulatory Visit (INDEPENDENT_AMBULATORY_CARE_PROVIDER_SITE_OTHER): Payer: Medicare Other | Admitting: Thoracic Surgery (Cardiothoracic Vascular Surgery)

## 2015-06-26 ENCOUNTER — Encounter: Payer: Self-pay | Admitting: Thoracic Surgery (Cardiothoracic Vascular Surgery)

## 2015-06-26 VITALS — BP 123/76 | HR 86 | Resp 20 | Ht 65.5 in | Wt 145.0 lb

## 2015-06-26 DIAGNOSIS — D381 Neoplasm of uncertain behavior of trachea, bronchus and lung: Secondary | ICD-10-CM

## 2015-06-26 DIAGNOSIS — Z85118 Personal history of other malignant neoplasm of bronchus and lung: Secondary | ICD-10-CM

## 2015-06-26 DIAGNOSIS — R918 Other nonspecific abnormal finding of lung field: Secondary | ICD-10-CM

## 2015-06-26 NOTE — Progress Notes (Signed)
[id number]       ,[place] [address]             [phone]      HPI: Tammy Eaton returns for a scheduled follow-up visit regarding her right upper lobe nodule.  She is a 65 year old woman with a history of tobacco abuse (quit in 1020), COPD, and stage IA lung cancer treated with a left upper lobectomy in 2005. She had a CT scan in October which showed a new right upper lobe opacity. PET CT showed the lesion was hypermetabolic. The official radiology report indicated that it was suspicious for an infectious or inflammatory nodule. She had a repeat CT in November 2017, which showed the nodule had increased in size.  Navigational bronchoscopy on 01/15/2015. Biopsy showed inflammation. AFB and fungal stains and cultures were negative.  I saw her back in January with an interval CT. It showed some retraction of the lesion.  In the interim since her last visit she had to stop taking meloxicam due to gastrointestinal issues.   She has not had any fevers, chills, or sweats. She denies productive cough.  Past Medical History  Diagnosis Date  . Arthritis   . COPD (chronic obstructive pulmonary disease) ([place])   . Emphysema of lung ([place])   . Hyperlipidemia   . Shortness of breath   . Wears glasses   . lung ca 05/2003  . Depression   . Reflux esophagitis   . Lung cancer ([place])     s/p LUL lobectomy  . Hyperlipidemia   . Osteoarthritis     spine, knees, hands  . Rhinitis   . PONV (postoperative nausea and vomiting)     pt "could not eat during hospital stay"  . Complication of anesthesia     "low blood pressure after retinal detachment surgery"  . Anxiety   . History of pneumonia     "multiple times"  . History of bronchitis     Current Outpatient Prescriptions  Medication Sig Dispense Refill  . acetaminophen (TYLENOL) 325 MG tablet Take 650 mg by mouth every 6 (six) hours as needed.    Marland Kitchen albuterol (PROVENTIL HFA;VENTOLIN HFA) 108 (90 BASE) MCG/ACT inhaler  Inhale 2 puffs into the lungs every 6 (six) hours as needed for wheezing or shortness of breath.    . ALPRAZolam (XANAX) 0.5 MG tablet Take 0.5 mg by mouth 3 (three) times daily as needed. For anxiety    . cetirizine (ZYRTEC) 10 MG tablet Take 10 mg by mouth daily.    . cholecalciferol (VITAMIN D) 1000 UNITS tablet Take 1,000 Units by mouth daily.    . CYMBALTA 60 MG capsule Take 60 mg by mouth daily.     . Fluticasone Furoate-Vilanterol (BREO ELLIPTA) 100-25 MCG/INH AEPB Inhale 1 puff into the lungs daily.     . meloxicam (MOBIC) 15 MG tablet Take 1 tablet by mouth daily as needed. Reported on 06/26/2015  3  . pseudoephedrine (SUDAFED) 30 MG tablet Take 30 mg by mouth every 4 (four) hours as needed for congestion.    . Vitamin D, Ergocalciferol, (DRISDOL) 50000 units CAPS capsule Take 1 capsule (50,000 Units total) by mouth every 14 (fourteen) days. 30 capsule 3   No current facility-administered medications for this visit.    Physical Exam BP 123/76 mmHg  Pulse 86  Resp 20  Ht 5' 5.5" (1.664 m)  Wt 145 lb (65.772 kg)  BMI 23.75 kg/m2  SpO2 97%  LMP 09/26/2013 (Exact Date) 65 year old woman in no acute distress Alert and oriented 3 with no focal deficits No cervical or subclavicular adenopathy Lungs diminished bilaterally, no wheezing Cardiac regular rate and rhythm normal S1 and S2  Diagnostic Tests: CT CHEST WITHOUT CONTRAST  TECHNIQUE: Multidetector CT imaging of the chest was performed following the standard protocol without IV contrast.  COMPARISON: 03/06/2015  FINDINGS: Mediastinum: The heart size appears normal. There is no pericardial effusion identified. Aortic atherosclerosis is noted. Calcification in the RCA, and LAD coronary artery noted. The trachea appears patent and is midline. Unremarkable appearance of the esophagus. There is no enlarged mediastinal or hilar adenopathy.  Lungs/Pleura: No pleural effusion identified. Advanced changes  of centrilobular emphysema identified. Scar, architectural distortion and central nodule are again identified within the right upper lobe. Index nodule within the right upper lobe measures 6 x 10 mm, image 25 of series 4. Previously 9 x 16 mm. No new or progressive nodularity identified.  Upper Abdomen: Low density structure within the anterior aspect of the left lobe of liver measures 8 mm and is unchanged, image 108 of series 3. Normal appearance of the spleen. The adrenal glands are unremarkable. The visualized portions of the left kidney are normal.  Musculoskeletal: Spondylosis is noted within the thoracic spine. No aggressive lytic or sclerotic bone lesions chronic healed deformity involving the sternum.  IMPRESSION: 1. There has been continued retraction of irregular right upper lobe nodular density with surrounding scarring and architectural distortion. No new or progressive pulmonary nodularity or adenopathy identified. 2. Emphysema 3. Aortic atherosclerosis and coronary artery calcification.   Electronically Signed  By: Kerby Moors M.D.  On: 06/26/2015 13:11  I personally reviewed the CT chest and concur with the findings as noted above.  Impression: 65 year old woman with a history of tobacco abuse and lung cancer who was found last fall to have a new right upper lobe opacity. This was hypermetabolic by PET CT, but radiology felt was most likely infectious or inflammatory. Navigational bronchoscopy and biopsy showed inflammation. The lesion was smaller on a follow-up scan 6 weeks later, so we elected to do a repeat scan in 3 months rather than any further attempts at biopsy. Her scan today shows the nodule has further decreased in size consistent with retraction and scarring.  I reviewed the CT scan with Tammy Eaton. Overall this is a very positive finding. I do think be on the safe side we will repeat another CT in about 6 months.  Tobacco abuse- she quit in  2010.  Plan:  Return in 6 months with CT chest  [name], MD [facility] [phone]

## 2015-10-30 ENCOUNTER — Other Ambulatory Visit (HOSPITAL_BASED_OUTPATIENT_CLINIC_OR_DEPARTMENT_OTHER): Payer: Medicare Other

## 2015-10-30 ENCOUNTER — Ambulatory Visit (HOSPITAL_BASED_OUTPATIENT_CLINIC_OR_DEPARTMENT_OTHER): Payer: Medicare Other | Admitting: Hematology

## 2015-10-30 ENCOUNTER — Telehealth: Payer: Self-pay | Admitting: Hematology

## 2015-10-30 ENCOUNTER — Encounter: Payer: Self-pay | Admitting: Hematology

## 2015-10-30 VITALS — BP 99/42 | HR 74 | Temp 97.9°F | Resp 18 | Ht 65.5 in | Wt 144.5 lb

## 2015-10-30 DIAGNOSIS — Z23 Encounter for immunization: Secondary | ICD-10-CM

## 2015-10-30 DIAGNOSIS — C3412 Malignant neoplasm of upper lobe, left bronchus or lung: Secondary | ICD-10-CM

## 2015-10-30 LAB — COMPREHENSIVE METABOLIC PANEL
ALT: 31 U/L (ref 0–55)
ANION GAP: 10 meq/L (ref 3–11)
AST: 25 U/L (ref 5–34)
Albumin: 3.6 g/dL (ref 3.5–5.0)
Alkaline Phosphatase: 89 U/L (ref 40–150)
BUN: 14.6 mg/dL (ref 7.0–26.0)
CALCIUM: 9.4 mg/dL (ref 8.4–10.4)
CHLORIDE: 107 meq/L (ref 98–109)
CO2: 26 meq/L (ref 22–29)
CREATININE: 0.8 mg/dL (ref 0.6–1.1)
EGFR: 82 mL/min/{1.73_m2} — AB (ref >90)
Glucose: 78 mg/dl (ref 70–140)
Potassium: 4.5 mEq/L (ref 3.5–5.1)
Sodium: 143 mEq/L (ref 136–145)
Total Bilirubin: 1.16 mg/dL (ref 0.20–1.20)
Total Protein: 6.8 g/dL (ref 6.4–8.3)

## 2015-10-30 LAB — CBC WITH DIFFERENTIAL/PLATELET
BASO%: 1.3 % (ref 0.0–2.0)
BASOS ABS: 0.1 10*3/uL (ref 0.0–0.1)
EOS ABS: 0.2 10*3/uL (ref 0.0–0.5)
EOS%: 3.7 % (ref 0.0–7.0)
HCT: 47.8 % — ABNORMAL HIGH (ref 34.8–46.6)
HGB: 15.7 g/dL (ref 11.6–15.9)
LYMPH%: 28.6 % (ref 14.0–49.7)
MCH: 28.8 pg (ref 25.1–34.0)
MCHC: 32.9 g/dL (ref 31.5–36.0)
MCV: 87.5 fL (ref 79.5–101.0)
MONO#: 0.5 10*3/uL (ref 0.1–0.9)
MONO%: 10.6 % (ref 0.0–14.0)
NEUT#: 2.7 10*3/uL (ref 1.5–6.5)
NEUT%: 55.8 % (ref 38.4–76.8)
Platelets: 224 10*3/uL (ref 145–400)
RBC: 5.46 10*6/uL — AB (ref 3.70–5.45)
RDW: 13.7 % (ref 11.2–14.5)
WBC: 4.9 10*3/uL (ref 3.9–10.3)
lymph#: 1.4 10*3/uL (ref 0.9–3.3)

## 2015-10-30 MED ORDER — INFLUENZA VAC SPLIT QUAD 0.5 ML IM SUSY
0.5000 mL | PREFILLED_SYRINGE | Freq: Once | INTRAMUSCULAR | Status: AC
  Administered 2015-10-30: 0.5 mL via INTRAMUSCULAR
  Filled 2015-10-30: qty 0.5

## 2015-10-30 NOTE — Telephone Encounter (Signed)
Gv pt appt for 10/29/16. Advised radiology will call to make ct scan prior to md appt  in 1 year.

## 2015-11-02 NOTE — Progress Notes (Signed)
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  INTERVAL HISTORY:  [name] is here for  scheduled follow on previous history of lung cancer and specifically  f/u of her right upper lobe lung nodule. She notes no acute new symptoms. No change in breathing. Did have a follow-up with Dr. Hendrickson/ cardiothoracic surgery and repeat CT scan of the chest with him on 06/26/2015 that showed continued retraction of the irregular right upper lobe nodular density with surrounding scarring and architectural distortion. There was no new or progressive pulmonary nodularity or adenopathy. She has been busy taking care of her grandchildren. No issues with her chronic arthritis but otherwise feels well.  MEDICAL HISTORY:  Past Medical History:  Diagnosis Date  . Anxiety   . Arthritis   . Complication of anesthesia    "low blood pressure after retinal detachment surgery"  . COPD (chronic obstructive pulmonary disease) ([place])   . Depression   . Emphysema of lung ([place])   . History of bronchitis   . History of pneumonia    "multiple times"  . Hyperlipidemia   . Hyperlipidemia   . lung ca 05/2003  . Lung cancer ([place])    s/p LUL lobectomy  . Osteoarthritis    spine, knees, hands  . PONV (postoperative nausea and vomiting)    pt "could not eat during hospital stay"  . Reflux esophagitis   . Rhinitis   . Shortness of breath   . Wears glasses     SURGICAL HISTORY: Past Surgical History:  Procedure Laterality Date  .  BREAST LUMPECTOMY WITH NEEDLE LOCALIZATION Right 04/11/2013   Procedure: BREAST LUMPECTOMY WITH NEEDLE LOCALIZATION;  Surgeon: Odis Hollingshead, MD;  Location: [place];  Service: General;  Laterality: Right;  . BREAST SURGERY  1992   lt br bx  . CHOLECYSTECTOMY  11/11/2011   Procedure: LAPAROSCOPIC CHOLECYSTECTOMY WITH INTRAOPERATIVE CHOLANGIOGRAM;  Surgeon: Odis Hollingshead, MD;  Location: [facility];  Service: General;  Laterality: N/A;  . COLONOSCOPY    . CYSTOSCOPY  12/10   for evaluation of hematuria  . DILATION AND CURETTAGE OF UTERUS    . ESOPHAGOGASTRODUODENOSCOPY    . EYE SURGERY     catataracts-both  . KNEE SURGERY     rt knee torn cartilage  . LUNG LOBECTOMY Left 4/05  . RETINAL DETACHMENT SURGERY  2011   left; x2  . SINUS SURGERY WITH INSTATRAK    . TUBAL LIGATION    . VIDEO BRONCHOSCOPY WITH ENDOBRONCHIAL NAVIGATION N/A 01/15/2015   Procedure: VIDEO BRONCHOSCOPY WITH ENDOBRONCHIAL NAVIGATION;  Surgeon: [name], MD;  Location: [facility] [place];  Service: Thoracic;  Laterality: N/A;  . VOCAL CORD LATERALIZATION, ENDOSCOPIC APPROACH W/ MLB     nodule removed    SOCIAL HISTORY: Social History   Social History  . Marital status: Married    Spouse name: N/A  . Number of children: 2  . Years of education: N/A   Occupational History  . Retired    Social History Main Topics  . Smoking status: Former  Smoker    Packs/day: 2.00    Years: 40.00    Quit date: 09/21/2008  . Smokeless tobacco: Never Used  . Alcohol use Yes     Comment: SOCIALLY  . Drug use: No  . Sexual activity: No   Other Topics Concern  . Not on file   Social History Narrative  . No narrative on file    FAMILY HISTORY: Family History  Problem Relation Age of Onset  . Cancer Mother 69    ovarian cancer  . Hypertension Mother   . Diabetes Mother   . Alcohol abuse Father     ALLERGIES:  is allergic to adhesive [tape]; amoxicillin; septra [sulfamethoxazole-trimethoprim]; and  oxycodone.  MEDICATIONS:  Current Outpatient Prescriptions  Medication Sig Dispense Refill  . acetaminophen (TYLENOL) 325 MG tablet Take 650 mg by mouth every 6 (six) hours as needed.    Tammy Eaton albuterol (PROVENTIL HFA;VENTOLIN HFA) 108 (90 BASE) MCG/ACT inhaler Inhale 2 puffs into the lungs every 6 (six) hours as needed for wheezing or shortness of breath.    . ALPRAZolam (XANAX) 0.5 MG tablet Take 0.5 mg by mouth 3 (three) times daily as needed. For anxiety    . cetirizine (ZYRTEC) 10 MG tablet Take 10 mg by mouth daily.    . cholecalciferol (VITAMIN D) 1000 UNITS tablet Take 1,000 Units by mouth daily.    . CYMBALTA 60 MG capsule Take 60 mg by mouth daily.     . Fluticasone Furoate-Vilanterol (BREO ELLIPTA) 100-25 MCG/INH AEPB Inhale 1 puff into the lungs daily.     . meloxicam (MOBIC) 15 MG tablet Take 1 tablet by mouth daily as needed. Reported on 06/26/2015  3  . pseudoephedrine (SUDAFED) 30 MG tablet Take 30 mg by mouth every 4 (four) hours as needed for congestion.    . Vitamin D, Ergocalciferol, (DRISDOL) 50000 units CAPS capsule Take 1 capsule (50,000 Units total) by mouth every 14 (fourteen) days. 30 capsule 3   No current facility-administered medications for this visit.     REVIEW OF SYSTEMS:    10 Point review of Systems was done is negative except as noted above.  PHYSICAL EXAMINATION: ECOG PERFORMANCE STATUS: 1 - Symptomatic but completely ambulatory  . Vitals:   10/30/15 1049  BP: (!) 99/42  Pulse: 74  Resp: 18  Temp: 97.9 F (36.6 C)   Filed Weights   10/30/15 1049  Weight: 144 lb 8 oz (65.5 kg)   .Body mass index is 23.68 kg/m.  GENERAL:alert, in no acute distress and comfortable SKIN: skin color, texture, turgor are normal, no rashes or significant lesions EYES: normal, conjunctiva are pink and non-injected, sclera clear OROPHARYNX:no exudate, no erythema and lips, buccal mucosa, and tongue normal  NECK: supple, no JVD, thyroid normal size, non-tender,  without nodularity LYMPH:  no palpable lymphadenopathy in the cervical, axillary or inguinal LUNGS: Decreased entry bilaterally with scattered rhonchi no rales  HEART: regular rate & rhythm,  no murmurs and no lower extremity edema ABDOMEN: abdomen soft, non-tender, normoactive bowel sounds  Musculoskeletal: no cyanosis of digits and no clubbing  PSYCH: alert & oriented x 3 with fluent speech NEURO: no focal motor/sensory deficits  LABORATORY DATA:  I have reviewed the data as listed  . CBC Latest Ref Rng & Units 10/30/2015 01/11/2015 11/20/2014  WBC 3.9 - 10.3 10e3/uL 4.9 5.7 5.5  Hemoglobin 11.6 - 15.9 g/dL 15.7 15.0 15.3  Hematocrit 34.8 - 46.6 % 47.8(H) 45.4 45.5  Platelets 145 - 400 10e3/uL 224  282 262    . CMP Latest Ref Rng & Units 10/30/2015 01/11/2015 11/20/2014  Glucose 70 - 140 mg/dl 78 109(H) 89  BUN 7.0 - 26.0 mg/dL 14.6 11 10.2  Creatinine 0.6 - 1.1 mg/dL 0.8 0.59 0.7  Sodium 136 - 145 mEq/L 143 141 143  Potassium 3.5 - 5.1 mEq/L 4.5 4.2 3.9  Chloride 101 - 111 mmol/L - 107 -  CO2 22 - 29 mEq/L '26 26 28  '$ Calcium 8.4 - 10.4 mg/dL 9.4 9.3 9.2  Total Protein 6.4 - 8.3 g/dL 6.8 6.7 6.6  Total Bilirubin 0.20 - 1.20 mg/dL 1.16 0.7 0.87  Alkaline Phos 40 - 150 U/L 89 81 78  AST 5 - 34 U/L '25 24 16  '$ ALT 0 - 55 U/L '31 23 17    '$ RADIOGRAPHIC STUDIES: I have personally reviewed the radiological images as listed and agreed with the findings in the report. CT CHEST WITHOUT CONTRAST  TECHNIQUE: Multidetector CT imaging of the chest was performed following the standard protocol without IV contrast.  COMPARISON:  03/06/2015  FINDINGS: Mediastinum: The heart size appears normal. There is no pericardial effusion identified. Aortic atherosclerosis is noted. Calcification in the RCA, and LAD coronary artery noted. The trachea appears patent and is midline. Unremarkable appearance of the esophagus. There is no enlarged mediastinal or hilar adenopathy.  Lungs/Pleura: No  pleural effusion identified. Advanced changes of centrilobular emphysema identified. Scar, architectural distortion and central nodule are again identified within the right upper lobe. Index nodule within the right upper lobe measures 6 x 10 mm, image 25 of series 4. Previously 9 x 16 mm. No new or progressive nodularity identified.  Upper Abdomen: Low density structure within the anterior aspect of the left lobe of liver measures 8 mm and is unchanged, image 108 of series 3. Normal appearance of the spleen. The adrenal glands are unremarkable. The visualized portions of the left kidney are normal.  Musculoskeletal: Spondylosis is noted within the thoracic spine. No aggressive lytic or sclerotic bone lesions chronic healed deformity involving the sternum.  IMPRESSION: 1. There has been continued retraction of irregular right upper lobe nodular density with surrounding scarring and architectural distortion. No new or progressive pulmonary nodularity or adenopathy identified. 2. Emphysema 3. Aortic atherosclerosis and coronary artery calcification.   Electronically Signed   By: Kerby Moors M.D.   On: 06/26/2015 13:11  EXAM: 2D DIGITAL SCREENING BILATERAL MAMMOGRAM WITH CAD AND ADJUNCT TOMO  COMPARISON:  Previous exam(s).  ACR Breast Density Category c: The breast tissue is heterogeneously dense, which may obscure small masses.  FINDINGS: There are no findings suspicious for malignancy. Images were processed with CAD.  IMPRESSION: No mammographic evidence of malignancy. A result letter of this screening mammogram will be mailed directly to the patient.  RECOMMENDATION: Screening mammogram in one year. (Code:SM-B-01Y)  BI-RADS CATEGORY  1: Negative.   Electronically Signed   By: Lovey Newcomer M.D.   On: 06/01/2015 07:48   ASSESSMENT & PLAN:   65 year old Caucasian female with history of COPD and prior history of left upper lobe stage IA non-small  cell lung cancer status post left upper lobectomy here for her annual follow-up. CT showed evidence of 2 cm right upper lobe opacity. PET/CT scan did not showing definite evidence of a neoplastic nodule and cannot rule out the presence of inflammation/infection causing uneven FDG avidity.  #1 history of stage IA left upper lobe non-small cell lung cancer status post left upper lobectomy in 2005. #2 new right  upper lobe lung opacity - unresolved/slightly larger on f/u CT chest. Did not improve with steroids and empiric antibiotics. TB quantiferon assay neg. subsequent navigational bronchoscopy was negative for lung cancer. Follow-up CT scan on 03/06/2015 shows interval retraction of the irregular right upper lobe nodule with the imaging characteristics consistent with an inflammatory process. No other worrisome pulmonary nodules or adenopathy noted.  Repeat CT chest 06/26/2015 - continued retraction of the right upper lobe lesion with no new pulmonary nodules or adenopathy .  #3 history of COPD not on home oxygen severe emphysema. #4 ex-smoker quit in 2010. An 80-pack-year history of smoking. #5 atypical ductal hyperplasia - notes no new breast lumps on self-examination. Bilateral screening mammogram on 05/31/2015 with no evidence of concerning breast lesions. Plan -Patient has no clinical evidence of lung cancer at this time and needs no other acute intervention. -She notes that she has follow-up with Dr. Jasmine Pang in dec 2017 with a repeat CT chest . -assuming no new issues with CT chest in 01/2016 with Dr Roxan Hockey shall see her back for routine follow-up in 1 year . -Next mammogram due April 2018 with PCP.  Return to care in 12 months with Dr Irene Limbo for continued f/u. Earlier if any new concerns on CT chest planned in 3 months with Dr. Leonarda Salon office and [facility]G in 05/2016 as per PCP.  All the patient's questions answered to her apparent satisfaction.  I spent 15 minutes counseling the patient  face to face. The total time spent in the appointment was 20 minutes and more than 50% was on counseling and direct patient cares.    Sullivan Lone MD [place] AAHIVMS [facility] Hematology/Oncology Physician [place]  (Office):       [phone] (Work cell):  [phone] (Fax):           [phone]

## 2015-11-20 ENCOUNTER — Other Ambulatory Visit: Payer: Medicare Other

## 2015-11-20 ENCOUNTER — Ambulatory Visit: Payer: Medicare Other | Admitting: Hematology

## 2015-11-28 ENCOUNTER — Other Ambulatory Visit: Payer: Self-pay | Admitting: Thoracic Surgery (Cardiothoracic Vascular Surgery)

## 2015-11-28 DIAGNOSIS — R911 Solitary pulmonary nodule: Secondary | ICD-10-CM

## 2015-12-25 ENCOUNTER — Ambulatory Visit (INDEPENDENT_AMBULATORY_CARE_PROVIDER_SITE_OTHER): Payer: Medicare Other | Admitting: Thoracic Surgery (Cardiothoracic Vascular Surgery)

## 2015-12-25 ENCOUNTER — Encounter: Payer: Self-pay | Admitting: Thoracic Surgery (Cardiothoracic Vascular Surgery)

## 2015-12-25 ENCOUNTER — Ambulatory Visit
Admission: RE | Admit: 2015-12-25 | Discharge: 2015-12-25 | Disposition: A | Discharge status: Home / Self Care | Payer: Medicare Other | Source: Ambulatory Visit | Attending: Thoracic Surgery (Cardiothoracic Vascular Surgery) | Admitting: Thoracic Surgery (Cardiothoracic Vascular Surgery)

## 2015-12-25 VITALS — BP 105/63 | HR 79 | Resp 20 | Ht 65.5 in | Wt 144.0 lb

## 2015-12-25 DIAGNOSIS — D381 Neoplasm of uncertain behavior of trachea, bronchus and lung: Secondary | ICD-10-CM | POA: Diagnosis not present

## 2015-12-25 DIAGNOSIS — Z85118 Personal history of other malignant neoplasm of bronchus and lung: Secondary | ICD-10-CM | POA: Diagnosis not present

## 2015-12-25 DIAGNOSIS — R911 Solitary pulmonary nodule: Secondary | ICD-10-CM

## 2015-12-25 NOTE — Progress Notes (Signed)
[address]       [place] [address]             [phone]    HPI: Tammy Eaton returns today for scheduled 6 month follow-up.  She is a 65 year old woman with a history of tobacco abuse, COPD, end-stage 1A lung cancer treated with left upper lobectomy in 2005. She quit smoking in 2010. In October 2016 she was found to have a new right upper lobe lung nodule in the background of severe emphysema. She was treated with antibiotics but increased in size. A PET CT was hypermetabolic but radiology felt this was infectious or inflammatory in nature. We did a navigational bronchoscopy which showed inflammation. Cultures were negative. We have been following the nodule since then. Her last CT was in May 2017. The nodule was slightly smaller.  She now returns for 6 month follow-up scan. In the interim since her last visit she has been feeling well. She is a little stressed that she has 25 family members coming for Thanksgiving. She has not had any recent exacerbations of COPD. Her appetite is good. She's not had any significant weight loss.  Past Medical History:  Diagnosis Date  . Anxiety   . Arthritis   . Complication of anesthesia    "low blood pressure after retinal detachment surgery"  . COPD (chronic obstructive pulmonary disease) ([place])   . Depression   . Emphysema of lung ([place])   . History of bronchitis   . History of pneumonia    "multiple times"  . Hyperlipidemia   . Hyperlipidemia   . lung ca 05/2003  . Lung cancer ([place])    s/p LUL lobectomy  . Osteoarthritis    spine, knees, hands  . PONV (postoperative nausea and vomiting)    pt "could not eat during hospital stay"  . Reflux esophagitis   . Rhinitis   . Shortness of breath   . Wears glasses      Current Outpatient Prescriptions  Medication Sig Dispense Refill  . acetaminophen (TYLENOL) 325 MG tablet Take 650 mg by mouth every 6 (six) hours as needed.    Marland Kitchen albuterol (PROVENTIL HFA;VENTOLIN HFA)  108 (90 BASE) MCG/ACT inhaler Inhale 2 puffs into the lungs every 6 (six) hours as needed for wheezing or shortness of breath.    . ALPRAZolam (XANAX) 0.5 MG tablet Take 0.5 mg by mouth 3 (three) times daily as needed. For anxiety    . cetirizine (ZYRTEC) 10 MG tablet Take 10 mg by mouth daily.    . cholecalciferol (VITAMIN D) 1000 UNITS tablet Take 1,000 Units by mouth daily.    . Coenzyme Q10 (CO Q 10) 100 MG CAPS Take 400 mg by mouth daily.    . CYMBALTA 60 MG capsule Take 60 mg by mouth daily.     . Fluticasone Furoate-Vilanterol (BREO ELLIPTA) 100-25 MCG/INH AEPB Inhale 1 puff into the lungs daily.     . meloxicam (MOBIC) 15 MG tablet Take 1 tablet by mouth daily as needed. Reported on 06/26/2015  3  . pseudoephedrine (SUDAFED) 30 MG tablet Take 30 mg by mouth every 4 (four) hours as needed for congestion.    . rosuvastatin (CRESTOR) 10 MG tablet Take 10 mg by mouth 3 (three) times a week.    . Vitamin D, Ergocalciferol, (DRISDOL) 50000 units CAPS capsule Take 1 capsule (50,000 Units total) by mouth every 14 (fourteen) days. 30 capsule 3   No current facility-administered medications  for this visit.     Physical Exam BP 105/63 (BP Location: Right Arm, Patient Position: Sitting, Cuff Size: Normal)   Pulse 79   Resp 20   Ht 5' 5.5" (1.664 m)   Wt 144 lb (65.3 kg)   LMP 09/26/2013 (Exact Date)   SpO2 95% Comment: RA  BMI 23.25 kg/m  65 year old woman in no acute distress Alert and oriented 3 with no focal deficits No cervical or supraclavicular adenopathy Cardiac regular rate and rhythm normal S1 and S2 Lungs with diminished breath sounds bilaterally, greater on left. No wheezing  Diagnostic Tests: CT CHEST WITHOUT CONTRAST  TECHNIQUE: Multidetector CT imaging of the chest was performed following the standard protocol without IV contrast.  COMPARISON:  06/26/2015  FINDINGS: Cardiovascular: The heart size appears normal. There is no pericardial effusion identified.  Aortic atherosclerosis identified. Calcification within the LAD coronary artery noted.  Mediastinum/Nodes: No mediastinal or hilar adenopathy identified. The trachea appears patent and is midline. Normal appearance of the esophagus. No axillary or supraclavicular adenopathy.  Lungs/Pleura: No pleural fluid identified. Advanced changes of centrilobular emphysema identified. Status post left upper lobectomy. There is a new indeterminate, elongated nodular density in the left lung which measures 16 x 6 mm mean, diameter 11 mm, image 50 of series 4. The previous index nodule within the right upper lung zone measures 10 x 5 mm, mean diameter 8 mm, image 27 of series 4. This is unchanged from previous exam.  Upper Abdomen: 8 mm low-attenuation structure within the left lobe of liver is unchanged from previous exam. Likely cysts. No acute abnormality noted within the upper abdomen. The adrenal glands appear normal. Bilateral nephrolithiasis identified.  Musculoskeletal: Degenerative disc disease is identified within the thoracic spine. No aggressive lytic or sclerotic bone lesions identified. Chronic healed deformity involving the sternum.  IMPRESSION: 1. The index nodule in the right upper lobe is unchanged when compared with previous exam with an equivalent diameter of 8 mm. 2. There is a new elongated nodule within the left lung which has an equivalent diameter of 11 mm. This is indeterminate. Consider short-term follow-up in 3-6 months for PET-CT for further investigation. 3. Emphysema 4. Aortic atherosclerosis and coronary artery calcification.   Electronically Signed   By: Kerby Moors M.D.   On: 12/25/2015 12:18 I personally reviewed the CT chest and concur with the findings noted above.  Impression: 65 year old woman with a history of tobacco abuse (quit 7 years ago), COPD, and a stage IA non-small cell carcinoma of the left upper lobe treated with lobectomy in  2005. She had a right upper lobe nodule found about a year ago. Biopsy showed inflammation. It has been followed with CT. On today's scan that nodule is slightly smaller. This is all consistent with scarring due to inflammation.  There was a new finding on her CT today in the left lower lobe. This is a 16 x 8 mm opacity peripherally. This would be a very unusual appearance for lung cancer, but I cannot rule it out to a CT scan. I suspect that this is another inflammatory nodule similar to the one in the right upper lobe. I don't think a PET will be particularly helpful at this point, because I think will be positive, but nonspecific. I would not advise biopsy at this time, but would recommend an earlier interval follow-up. I discussed these issues with Mrs. Adsit and reviewed the films with her. She is in agreement.  Plan: Return in 3 months with CT  chest  I spent 15 minutes with Mrs. Kozlov during this visit. [name], MD [facility] [phone]

## 2016-02-04 DIAGNOSIS — M419 Scoliosis, unspecified: Secondary | ICD-10-CM

## 2016-02-04 DIAGNOSIS — M5126 Other intervertebral disc displacement, lumbar region: Secondary | ICD-10-CM

## 2016-02-04 DIAGNOSIS — M51369 Other intervertebral disc degeneration, lumbar region without mention of lumbar back pain or lower extremity pain: Secondary | ICD-10-CM

## 2016-02-04 DIAGNOSIS — M5136 Other intervertebral disc degeneration, lumbar region: Secondary | ICD-10-CM

## 2016-02-04 DIAGNOSIS — M1990 Unspecified osteoarthritis, unspecified site: Secondary | ICD-10-CM

## 2016-02-04 HISTORY — DX: Unspecified osteoarthritis, unspecified site: M19.90

## 2016-02-04 HISTORY — DX: Other intervertebral disc displacement, lumbar region: M51.26

## 2016-02-04 HISTORY — DX: Other intervertebral disc degeneration, lumbar region without mention of lumbar back pain or lower extremity pain: M51.369

## 2016-02-04 HISTORY — DX: Scoliosis, unspecified: M41.9

## 2016-02-04 HISTORY — DX: Other intervertebral disc degeneration, lumbar region: M51.36

## 2016-02-22 ENCOUNTER — Emergency Department (HOSPITAL_COMMUNITY)
Admission: EM | Admit: 2016-02-22 | Discharge: 2016-02-22 | Disposition: A | Discharge status: Home / Self Care | Payer: Medicare Other | Attending: Emergency Medicine | Admitting: Emergency Medicine

## 2016-02-22 ENCOUNTER — Encounter (HOSPITAL_COMMUNITY): Payer: Self-pay

## 2016-02-22 ENCOUNTER — Other Ambulatory Visit: Payer: Self-pay | Admitting: Thoracic Surgery (Cardiothoracic Vascular Surgery)

## 2016-02-22 ENCOUNTER — Emergency Department (HOSPITAL_COMMUNITY): Payer: Medicare Other

## 2016-02-22 DIAGNOSIS — R918 Other nonspecific abnormal finding of lung field: Secondary | ICD-10-CM

## 2016-02-22 DIAGNOSIS — Z85118 Personal history of other malignant neoplasm of bronchus and lung: Secondary | ICD-10-CM | POA: Insufficient documentation

## 2016-02-22 DIAGNOSIS — Z87891 Personal history of nicotine dependence: Secondary | ICD-10-CM | POA: Insufficient documentation

## 2016-02-22 DIAGNOSIS — Z79899 Other long term (current) drug therapy: Secondary | ICD-10-CM | POA: Insufficient documentation

## 2016-02-22 DIAGNOSIS — J449 Chronic obstructive pulmonary disease, unspecified: Secondary | ICD-10-CM | POA: Insufficient documentation

## 2016-02-22 DIAGNOSIS — N132 Hydronephrosis with renal and ureteral calculous obstruction: Secondary | ICD-10-CM | POA: Diagnosis not present

## 2016-02-22 DIAGNOSIS — N179 Acute kidney failure, unspecified: Secondary | ICD-10-CM

## 2016-02-22 DIAGNOSIS — N200 Calculus of kidney: Secondary | ICD-10-CM

## 2016-02-22 DIAGNOSIS — R1032 Left lower quadrant pain: Secondary | ICD-10-CM | POA: Diagnosis present

## 2016-02-22 DIAGNOSIS — R112 Nausea with vomiting, unspecified: Secondary | ICD-10-CM

## 2016-02-22 LAB — COMPREHENSIVE METABOLIC PANEL
ALBUMIN: 4.4 g/dL (ref 3.5–5.0)
ALT: 37 U/L (ref 14–54)
ANION GAP: 6 (ref 5–15)
AST: 30 U/L (ref 15–41)
Alkaline Phosphatase: 75 U/L (ref 38–126)
BILIRUBIN TOTAL: 1.7 mg/dL — AB (ref 0.3–1.2)
BUN: 17 mg/dL (ref 6–20)
CO2: 27 mmol/L (ref 22–32)
Calcium: 9.1 mg/dL (ref 8.9–10.3)
Chloride: 107 mmol/L (ref 101–111)
Creatinine, Ser: 1.21 mg/dL — ABNORMAL HIGH (ref 0.44–1.00)
GFR calc Af Amer: 53 mL/min — ABNORMAL LOW (ref >60)
GFR calc non Af Amer: 46 mL/min — ABNORMAL LOW (ref >60)
GLUCOSE: 115 mg/dL — AB (ref 65–99)
POTASSIUM: 4.4 mmol/L (ref 3.5–5.1)
SODIUM: 140 mmol/L (ref 135–145)
Total Protein: 6.7 g/dL (ref 6.5–8.1)

## 2016-02-22 LAB — URINALYSIS, ROUTINE W REFLEX MICROSCOPIC
BILIRUBIN URINE: NEGATIVE
GLUCOSE, UA: NEGATIVE mg/dL
HGB URINE DIPSTICK: NEGATIVE
KETONES UR: 5 mg/dL — AB
Leukocytes, UA: NEGATIVE
Nitrite: NEGATIVE
PH: 5 (ref 5.0–8.0)
Protein, ur: NEGATIVE mg/dL
SPECIFIC GRAVITY, URINE: 1.034 — AB (ref 1.005–1.030)

## 2016-02-22 LAB — LIPASE, BLOOD: Lipase: 24 U/L (ref 11–51)

## 2016-02-22 LAB — CBC
HEMATOCRIT: 45.7 % (ref 36.0–46.0)
HEMOGLOBIN: 15.4 g/dL — AB (ref 12.0–15.0)
MCH: 29.8 pg (ref 26.0–34.0)
MCHC: 33.7 g/dL (ref 30.0–36.0)
MCV: 88.6 fL (ref 78.0–100.0)
Platelets: 229 10*3/uL (ref 150–400)
RBC: 5.16 MIL/uL — ABNORMAL HIGH (ref 3.87–5.11)
RDW: 13.8 % (ref 11.5–15.5)
WBC: 9.6 10*3/uL (ref 4.0–10.5)

## 2016-02-22 MED ORDER — MORPHINE SULFATE (PF) 4 MG/ML IV SOLN
4.0000 mg | Freq: Once | INTRAVENOUS | Status: AC
  Administered 2016-02-22: 4 mg via INTRAVENOUS
  Filled 2016-02-22: qty 1

## 2016-02-22 MED ORDER — ONDANSETRON HCL 4 MG/2ML IJ SOLN
4.0000 mg | Freq: Once | INTRAMUSCULAR | Status: AC
  Administered 2016-02-22: 4 mg via INTRAVENOUS
  Filled 2016-02-22: qty 2

## 2016-02-22 MED ORDER — HYDROMORPHONE HCL 1 MG/ML IJ SOLN
0.5000 mg | Freq: Once | INTRAMUSCULAR | Status: AC
  Administered 2016-02-22: 0.5 mg via INTRAVENOUS
  Filled 2016-02-22: qty 1

## 2016-02-22 MED ORDER — IOPAMIDOL (ISOVUE-300) INJECTION 61%
INTRAVENOUS | Status: AC
  Administered 2016-02-22: 100 mL
  Filled 2016-02-22: qty 100

## 2016-02-22 MED ORDER — ONDANSETRON 4 MG PO TBDP: 4.0000 mg | ORAL_TABLET | Freq: Three times a day (TID) | ORAL | 0 refills | Status: DC | PRN

## 2016-02-22 MED ORDER — OXYCODONE-ACETAMINOPHEN 5-325 MG PO TABS: 1.0000 | ORAL_TABLET | ORAL | 0 refills | Status: DC | PRN

## 2016-02-22 MED ORDER — SODIUM CHLORIDE 0.9 % IV BOLUS (SEPSIS)
1000.0000 mL | Freq: Once | INTRAVENOUS | Status: AC
  Administered 2016-02-22: 1000 mL via INTRAVENOUS

## 2016-02-22 MED ORDER — FAMOTIDINE IN NACL 20-0.9 MG/50ML-% IV SOLN
20.0000 mg | Freq: Once | INTRAVENOUS | Status: AC
  Administered 2016-02-22: 20 mg via INTRAVENOUS
  Filled 2016-02-22: qty 50

## 2016-02-22 MED ORDER — ONDANSETRON 4 MG PO TBDP
4.0000 mg | ORAL_TABLET | Freq: Once | ORAL | Status: AC | PRN
  Administered 2016-02-22: 4 mg via ORAL
  Filled 2016-02-22: qty 1

## 2016-02-22 NOTE — ED Notes (Signed)
Family at bedside. 

## 2016-02-22 NOTE — ED Provider Notes (Signed)
[facility] DEPT Provider Note   CSN: [id number] Arrival date & time: 02/22/16  1029     History   Chief Complaint Chief Complaint  Patient presents with  . Abdominal Pain  . Emesis    HPI   Blood pressure 126/64, pulse 78, temperature 98.1 F (36.7 C), temperature source Oral, resp. rate 16, height '5\' 6"'$  (1.676 m), weight 63.5 kg, last menstrual period 09/26/2013, SpO2 100 %.  Tammy Eaton is a 66 y.o. female with past medical history significant for COPD, lung cancer in remission complaining of multiple episodes of non-bloody, nonbilious, no coffee-ground emesis onset yesterday followed by a severe left lower quadrant pain which kept her from sleep last night. She rates her pain a 3 out of 10 right now. She took 2 extra strength Tylenol with some relief. She denies sick contacts, fever, chills, change in urination (she's taking Azo under the care of urology for presumed bladder spasm: She has cystoscopy and ultrasound scheduled in the next month) she denies dysuria, hematuria. She's having normal bowel movements, she is not anticoagulated and she denies melena or hematochezia. Denies fever or chills, chest pain was of breath. Last colonoscopy was 2 years ago which showed polyps and no overt diverticulosis as per patient.   Past Medical History:  Diagnosis Date  . Anxiety   . Arthritis   . Complication of anesthesia    "low blood pressure after retinal detachment surgery"  . COPD (chronic obstructive pulmonary disease) ([place])   . Depression   . Emphysema of lung ([place])   . History of bronchitis   . History of pneumonia    "multiple times"  . Hyperlipidemia   . Hyperlipidemia   . lung ca 05/2003  . Lung cancer ([place])    s/p LUL lobectomy  . Osteoarthritis    spine, knees, hands  . PONV (postoperative nausea and vomiting)    pt "could not eat during hospital stay"  . Reflux esophagitis   . Rhinitis   . Shortness of breath   . Wears glasses     Patient Active Problem  List   Diagnosis Date Noted  . COPD GOLD II  12/25/2014  . Nodule of right lung 12/11/2014  . 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 Primary cancer of left upper lobe of lung ([place]) 11/27/2011  . Symptomatic cholelithiasis 09/22/2011    Past Surgical History:  Procedure Laterality Date  . BREAST LUMPECTOMY WITH NEEDLE LOCALIZATION Right 04/11/2013   Procedure: BREAST LUMPECTOMY WITH NEEDLE LOCALIZATION;  Surgeon: Odis Hollingshead, MD;  Location: [facility];  Service: General;  Laterality: Right;  . BREAST SURGERY  1992   lt br bx  . CHOLECYSTECTOMY  11/11/2011   Procedure: LAPAROSCOPIC CHOLECYSTECTOMY WITH INTRAOPERATIVE CHOLANGIOGRAM;  Surgeon: Odis Hollingshead, MD;  Location: [facility];  Service: General;  Laterality: N/A;  . COLONOSCOPY    . CYSTOSCOPY  12/10   for evaluation of hematuria  . DILATION AND CURETTAGE OF UTERUS    . ESOPHAGOGASTRODUODENOSCOPY    . EYE SURGERY     catataracts-both  . KNEE SURGERY     rt knee torn cartilage  . LUNG LOBECTOMY Left 4/05  . RETINAL DETACHMENT SURGERY  2011   left; x2  . SINUS SURGERY WITH INSTATRAK    . TUBAL LIGATION    . VIDEO BRONCHOSCOPY WITH ENDOBRONCHIAL NAVIGATION N/A 01/15/2015   Procedure: VIDEO BRONCHOSCOPY WITH ENDOBRONCHIAL NAVIGATION;  Surgeon: [name], MD;  Location: [facility];  Service:  Thoracic;  Laterality: N/A;  . VOCAL CORD LATERALIZATION, ENDOSCOPIC APPROACH W/ MLB     nodule removed    OB History    Gravida Para Term Preterm AB Living   '2 2 2 '$ 0 0 2   SAB TAB Ectopic Multiple Live Births   0 0 0 0 2       Home Medications    Prior to Admission medications   Medication Sig Start Date End Date Taking? Authorizing Provider  acetaminophen (TYLENOL) 325 MG tablet Take 650 mg by mouth every 6 (six) hours as needed.   Yes Historical Provider, MD  albuterol (PROVENTIL HFA;VENTOLIN HFA) 108 (90 BASE) MCG/ACT inhaler Inhale 2 puffs into the lungs every 6 (six) hours as needed for wheezing or  shortness of breath.   Yes Historical Provider, MD  ALPRAZolam Duanne Moron) 0.5 MG tablet Take 0.5 mg by mouth 3 (three) times daily as needed. For anxiety 09/15/11  Yes Historical Provider, MD  Biotin 5000 MCG CAPS Take 5,000 mcg by mouth 2 (two) times daily.   Yes Historical Provider, MD  Coenzyme Q10 (CO Q 10) 100 MG CAPS Take 400 mg by mouth daily.   Yes Historical Provider, MD  CYMBALTA 60 MG capsule Take 60 mg by mouth daily.  09/11/11  Yes Historical Provider, MD  Fluticasone Furoate-Vilanterol (BREO ELLIPTA) 100-25 MCG/INH AEPB Inhale 1 puff into the lungs daily.    Yes Historical Provider, MD  Glucosamine 500 MG CAPS Take 500 mg by mouth 3 (three) times daily.   Yes Historical Provider, MD  meloxicam (MOBIC) 15 MG tablet Take 1 tablet by mouth daily as needed. Reported on 06/26/2015 01/24/15  Yes Historical Provider, MD  pseudoephedrine (SUDAFED) 30 MG tablet Take 30 mg by mouth every 4 (four) hours as needed for congestion.   Yes Historical Provider, MD  rosuvastatin (CRESTOR) 5 MG tablet Take 5 mg by mouth 4 (four) times a week. 01/30/16  Yes Historical Provider, MD  Vitamin D, Ergocalciferol, (DRISDOL) 50000 units CAPS capsule Take 1 capsule (50,000 Units total) by mouth every 14 (fourteen) days. 02/23/15  Yes Kem Boroughs, FNP  ondansetron (ZOFRAN ODT) 4 MG disintegrating tablet Take 1 tablet (4 mg total) by mouth every 8 (eight) hours as needed for nausea or vomiting. 02/22/16   Rida Loudin, PA-C  oxyCODONE-acetaminophen (PERCOCET) 5-325 MG tablet Take 1 tablet by mouth every 4 (four) hours as needed. 02/22/16   Azzure Garabedian, PA-C  rosuvastatin (CRESTOR) 10 MG tablet Take 10 mg by mouth 3 (three) times a week.    Historical Provider, MD    Family History Family History  Problem Relation Age of Onset  . Cancer Mother 35    ovarian cancer  . Hypertension Mother   . Diabetes Mother   . Alcohol abuse Father     Social History Social History  Substance Use Topics  . Smoking  status: Former Smoker    Packs/day: 2.00    Years: 40.00    Quit date: 09/21/2008  . Smokeless tobacco: Never Used  . Alcohol use Yes     Comment: SOCIALLY     Allergies   Adhesive [tape]; Amoxicillin; Septra [sulfamethoxazole-trimethoprim]; and Oxycodone   Review of Systems Review of Systems  10 systems reviewed and found to be negative, except as noted in the HPI.  Physical Exam Updated Vital Signs BP 135/62   Pulse 79   Temp 98.1 F (36.7 C) (Oral)   Resp 18   Ht '5\' 6"'$  (1.676 m)  Wt 63.5 kg   LMP 09/26/2013 (Exact Date)   SpO2 95%   BMI 22.60 kg/m   Physical Exam  Constitutional: She is oriented to person, place, and time. She appears well-developed and well-nourished. No distress.  HENT:  Head: Normocephalic and atraumatic.  Mouth/Throat: Oropharynx is clear and moist.  Eyes: Conjunctivae and EOM are normal. Pupils are equal, round, and reactive to light.  Neck: Normal range of motion.  Cardiovascular: Normal rate, regular rhythm and intact distal pulses.   Pulmonary/Chest: Effort normal and breath sounds normal. No stridor. No respiratory distress. She has no wheezes. She has no rales. She exhibits no tenderness.  Abdominal: Soft. Bowel sounds are normal. She exhibits no distension and no mass. There is tenderness. There is no rebound and no guarding. No hernia.  Noro-active bowel sounds, tenderness palpation the left lower quadrant with no guarding or rebound.  Genitourinary:  Genitourinary Comments: No CVA tenderness to percussion bilaterally  Musculoskeletal: Normal range of motion.  Neurological: She is alert and oriented to person, place, and time.  Skin: Capillary refill takes less than 2 seconds. She is not diaphoretic.  Psychiatric: She has a normal mood and affect.  Nursing note and vitals reviewed.    ED Treatments / Results  Labs (all labs ordered are listed, but only abnormal results are displayed) Labs Reviewed  COMPREHENSIVE METABOLIC PANEL  - Abnormal; Notable for the following:       Result Value   Glucose, Bld 115 (*)    Creatinine, Ser 1.21 (*)    Total Bilirubin 1.7 (*)    GFR calc non Af Amer 46 (*)    GFR calc Af Amer 53 (*)    All other components within normal limits  CBC - Abnormal; Notable for the following:    RBC 5.16 (*)    Hemoglobin 15.4 (*)    All other components within normal limits  URINALYSIS, ROUTINE W REFLEX MICROSCOPIC - Abnormal; Notable for the following:    Specific Gravity, Urine 1.034 (*)    Ketones, ur 5 (*)    All other components within normal limits  LIPASE, BLOOD    EKG  EKG Interpretation None       Radiology Ct Abdomen Pelvis W Contrast  Result Date: 02/22/2016 CLINICAL DATA:  LEFT lower quadrant pain for 1 day with vomiting, history of stomach ulcers, COPD, lung cancer, former smoker EXAM: CT ABDOMEN AND PELVIS WITH CONTRAST TECHNIQUE: Multidetector CT imaging of the abdomen and pelvis was performed using the standard protocol following bolus administration of intravenous contrast. Sagittal and coronal MPR images reconstructed from axial data set. CONTRAST:  100 ML ISOVUE-300 IOPAMIDOL (ISOVUE-300) INJECTION 61% IV. No oral contrast was deferred administered. COMPARISON:  12/04/2014 PET-CT FINDINGS: Lower chest: Emphysematous changes with chronic accentuation of bibasilar interstitial markings. Focal opacity at lateral LEFT lower lobe images 1-4 likely represents atelectasis though requires followup until resolution to exclude mass. Hepatobiliary: Gallbladder surgically absent. Focal fatty infiltration adjacent to falciform fissure. Small hepatic cysts lab segment LEFT lobe. Pancreas: Normal appearance Spleen: Tiny nonspecific low-attenuation splenic lesion 4 mm diameter. Otherwise negative exam. Adrenals/Urinary Tract: Adrenal glands normal appearance. Tiny BILATERAL renal cysts. LEFT hydronephrosis with delay in LEFT nephrogram and LEFT ureteral dilatation terminating at a 3 mm diameter  calculus at the LEFT ureterovesical junction. Mild LEFT perinephric, periureteral and peripelvic edema. RIGHT ureter decompressed. Bladder unremarkable. Stomach/Bowel: Normal appendix. Stomach and bowel loops grossly normal appearance for exam lacking GI contrast. Vascular/Lymphatic: Atherosclerotic calcifications aorta without aneurysm.  No adenopathy. Reproductive: Unremarkable uterus and adnexa. Other: No free air or free fluid. Tiny umbilical hernia containing fat. Musculoskeletal: Bones demineralized. IMPRESSION: LEFT hydronephrosis and hydroureter secondary to a 3 mm LEFT UVJ calculus. Emphysematous changes with bibasilar atelectasis as well as a more focal opacity at lateral LEFT lower lobe 15 x 11 mm in size, favor atelectasis but recommend followup CT chest in 3 months to assess for persistence. Aortic atherosclerosis. Electronically Signed   By: Lavonia Dana M.D.   On: 02/22/2016 16:50    Procedures Procedures (including critical care time)  Medications Ordered in ED Medications  HYDROmorphone (DILAUDID) injection 0.5 mg (not administered)  ondansetron (ZOFRAN) injection 4 mg (not administered)  ondansetron (ZOFRAN-ODT) disintegrating tablet 4 mg (4 mg Oral Given 02/22/16 1124)  ondansetron (ZOFRAN) injection 4 mg (4 mg Intravenous Given 02/22/16 1646)  famotidine (PEPCID) IVPB 20 mg premix (0 mg Intravenous Stopped 02/22/16 1753)  sodium chloride 0.9 % bolus 1,000 mL (1,000 mLs Intravenous New Bag/Given 02/22/16 1646)  iopamidol (ISOVUE-300) 61 % injection (100 mLs  Contrast Given 02/22/16 1620)  morphine 4 MG/ML injection 4 mg (4 mg Intravenous Given 02/22/16 1646)  HYDROmorphone (DILAUDID) injection 0.5 mg (0.5 mg Intravenous Given 02/22/16 1750)     Initial Impression / Assessment and Plan / ED Course  I have reviewed the triage vital signs and the nursing notes.  Pertinent labs & imaging results that were available during my care of the patient were reviewed by me and considered in my  medical decision making (see chart for details).     Vitals:   02/22/16 1106 02/22/16 1318 02/22/16 1752  BP: 122/58 126/64 135/62  Pulse: 70 78 79  Resp: '12 16 18  '$ Temp: 98.1 F (36.7 C)    TempSrc: Oral    SpO2: 92% 100% 95%  Weight: 63.5 kg    Height: '5\' 6"'$  (1.676 m)      Medications  HYDROmorphone (DILAUDID) injection 0.5 mg (not administered)  ondansetron (ZOFRAN) injection 4 mg (not administered)  ondansetron (ZOFRAN-ODT) disintegrating tablet 4 mg (4 mg Oral Given 02/22/16 1124)  ondansetron (ZOFRAN) injection 4 mg (4 mg Intravenous Given 02/22/16 1646)  famotidine (PEPCID) IVPB 20 mg premix (0 mg Intravenous Stopped 02/22/16 1753)  sodium chloride 0.9 % bolus 1,000 mL (1,000 mLs Intravenous New Bag/Given 02/22/16 1646)  iopamidol (ISOVUE-300) 61 % injection (100 mLs  Contrast Given 02/22/16 1620)  morphine 4 MG/ML injection 4 mg (4 mg Intravenous Given 02/22/16 1646)  HYDROmorphone (DILAUDID) injection 0.5 mg (0.5 mg Intravenous Given 02/22/16 1750)    JALEY YAN is 66 y.o. female presenting with Severe left lower quadrant pain which kept her from sleep at night, mildly alleviated with 2 extra strength Tylenol. She's had multiple episodes of emesis today. Abdominal exam is nonsurgical, she has pain in the left lower quadrant, she's had a recent colonoscopy and no history of diverticulosis. She denies any diarrhea, melena, hematochezia. Patient is afebrile and nontoxic appearing. Blood work with the mild AKA IM and findings consistent with hemoconcentration with a mild elevation of T bili, hemoglobin 15 hematocrit of 45. Lipase is normal. CT abdomen pelvis with a 3 mm left-sided stone with hydroureter. They do note some emphysematous changes with the opacity in the left lower lobe. Patient is status post lung cancer and she is aware of abnormalities in the left lobe, she has frequent screening CTs. Her AK I is likely secondary to her vomiting. We've had an extensive discussion of  return precautions,  she's tolerating by mouth's at this time and she understands she will need to push fluids.  Evaluation does not show pathology that would require ongoing emergent intervention or inpatient treatment. Pt is hemodynamically stable and mentating appropriately. Discussed findings and plan with patient/guardian, who agrees with care plan. All questions answered. Return precautions discussed and outpatient follow up given.   Final Clinical Impressions(s) / ED Diagnoses   Final diagnoses:  Nephrolithiasis  Nausea and vomiting, intractability of vomiting not specified, unspecified vomiting type  AKI (acute kidney injury) ([place])    New Prescriptions New Prescriptions   ONDANSETRON (ZOFRAN ODT) 4 MG DISINTEGRATING TABLET    Take 1 tablet (4 mg total) by mouth every 8 (eight) hours as needed for nausea or vomiting.   OXYCODONE-ACETAMINOPHEN (PERCOCET) 5-325 MG TABLET    Take 1 tablet by mouth every 4 (four) hours as needed.     Monico Blitz, PA-C 02/22/16 1908    Fatima Blank, MD 02/23/16 2245

## 2016-02-22 NOTE — ED Triage Notes (Signed)
Pt c/o LLQ pain and emesis x 1 days.  Pain score 3/10.  Denies diarrhea.  Pt reports taking tylenol w/ some relief.  Pt reports previously stomach ulcers d/t taking meloxicam.  Sts she has taken a few doses recently.

## 2016-02-22 NOTE — ED Notes (Signed)
Patient transported to CT 

## 2016-02-22 NOTE — Discharge Instructions (Signed)
Take percocet for breakthrough pain, do not drink alcohol, drive, care for children or do other critical tasks while taking percocet. ° °Please follow with your primary care doctor in the next 2 days for a check-up. They must obtain records for further management.  ° °Do not hesitate to return to the Emergency Department for any new, worsening or concerning symptoms.  ° °

## 2016-02-22 NOTE — ED Notes (Signed)
Pt reports LLQ pain with n/v since yesterday.  Denies any urinary sxs or vaginal d/c at this time.

## 2016-02-27 ENCOUNTER — Ambulatory Visit: Payer: Medicare Other | Admitting: Nurse Practitioner

## 2016-02-29 ENCOUNTER — Other Ambulatory Visit: Payer: Self-pay | Admitting: Nurse Practitioner

## 2016-02-29 NOTE — Telephone Encounter (Signed)
Medication refill request: Vitamin D Last AEX:  02/23/15 PG Next AEX: 03/28/16 PG Last MMG (if hormonal medication request): 05/31/15 [facility] Refill authorized: 02/23/15 #[id number] 3R. Please advise. Thank you.

## 2016-03-26 NOTE — Progress Notes (Signed)
Patient ID: Tammy Eaton, female   DOB: 1950-07-06, 66 y.o.   MRN: [id number]  66 y.o. G2P2002 Married  Caucasian Fe here for annual exam.  Kidney stone in January and had the' flu' end of January.  Does not recall when she took her  last Vit D tablet.  Last chest CT scan shows stability of lung nodule on the right but a new nodule on the left that will be having a follow up and perhaps a PET scan..  Patient's last menstrual period was 09/26/2013 (exact date).          Sexually active: No.  The current method of family planning is post menopausal status.    Exercising: No.  The patient does not participate in regular exercise at present. Smoker:  no  Health Maintenance: Pap: 02/20/14, Negative with neg HR HPV MMG: 05/31/15, 3D, Bi-Rads 1: Negative Colonoscopy: 06/07/14, polyp, repeat in 5 years BMD: 03/11/10, T Score -1.4 Spine / -1.2 Left Femur Neck TDaP: 02/2011 Shingles: 02/2011 Pneumonia: 02/22/2015 Prevnar-13 Hep C and HIV: 02/23/15 Labs: PCP in EPIC, we follow Vit D   reports that she quit smoking about 7 years ago. She has a 80.00 pack-year smoking history. She has never used smokeless tobacco. She reports that she drinks alcohol. She reports that she does not use drugs.  Past Medical History:  Diagnosis Date  . Anxiety   . Arthritis   . Complication of anesthesia    "low blood pressure after retinal detachment surgery"  . COPD (chronic obstructive pulmonary disease) ([place])   . Depression   . Emphysema of lung ([place])   . History of bronchitis   . History of pneumonia    "multiple times"  . Hyperlipidemia   . Hyperlipidemia   . Kidney stones 02/2016  . lung ca 05/2003  . Lung cancer ([place])    s/p LUL lobectomy  . Osteoarthritis    spine, knees, hands  . PONV (postoperative nausea and vomiting)    pt "could not eat during hospital stay"  . Reflux esophagitis   . Rhinitis   . Shortness of breath   . Wears glasses     Past Surgical History:  Procedure Laterality Date  .  BREAST LUMPECTOMY WITH NEEDLE LOCALIZATION Right 04/11/2013   Procedure: BREAST LUMPECTOMY WITH NEEDLE LOCALIZATION;  Surgeon: Odis Hollingshead, MD;  Location: [facility];  Service: General;  Laterality: Right;  . BREAST SURGERY  1992   lt br bx  . CHOLECYSTECTOMY  11/11/2011   Procedure: LAPAROSCOPIC CHOLECYSTECTOMY WITH INTRAOPERATIVE CHOLANGIOGRAM;  Surgeon: Odis Hollingshead, MD;  Location: [facility];  Service: General;  Laterality: N/A;  . COLONOSCOPY    . CYSTOSCOPY  12/10   for evaluation of hematuria  . DILATION AND CURETTAGE OF UTERUS    . ESOPHAGOGASTRODUODENOSCOPY    . EYE SURGERY     catataracts-both  . KNEE SURGERY     rt knee torn cartilage  . LUNG LOBECTOMY Left 4/05  . RETINAL DETACHMENT SURGERY  2011   left; x2  . SINUS SURGERY WITH INSTATRAK    . TUBAL LIGATION    . VIDEO BRONCHOSCOPY WITH ENDOBRONCHIAL NAVIGATION N/A 01/15/2015   Procedure: VIDEO BRONCHOSCOPY WITH ENDOBRONCHIAL NAVIGATION;  Surgeon: [name], MD;  Location: [facility];  Service: Thoracic;  Laterality: N/A;  . VOCAL CORD LATERALIZATION, ENDOSCOPIC APPROACH W/ MLB     nodule removed    Current Outpatient Prescriptions  Medication Sig Dispense Refill  . acetaminophen (TYLENOL)  325 MG tablet Take 650 mg by mouth every 6 (six) hours as needed.    Marland Kitchen albuterol (PROVENTIL HFA;VENTOLIN HFA) 108 (90 BASE) MCG/ACT inhaler Inhale 2 puffs into the lungs every 6 (six) hours as needed for wheezing or shortness of breath.    . ALPRAZolam (XANAX) 0.5 MG tablet Take 0.5 mg by mouth 3 (three) times daily as needed. For anxiety    . Biotin 5000 MCG CAPS Take 5,000 mcg by mouth 2 (two) times daily.    . Coenzyme Q10 (CO Q 10) 100 MG CAPS Take 400 mg by mouth daily.    . CYMBALTA 60 MG capsule Take 60 mg by mouth daily.     . Fluticasone Furoate-Vilanterol (BREO ELLIPTA) 100-25 MCG/INH AEPB Inhale 1 puff into the lungs daily.     . Glucosamine 500 MG CAPS Take 500 mg by mouth 3 (three) times daily.     . meloxicam (MOBIC) 15 MG tablet Take 1 tablet by mouth daily as needed. Reported on 06/26/2015  3  . Meth-Hyo-M Bl-Na Phos-Ph Sal (URO-458) 81 MG TABS Take 1 tablet by mouth every 6 (six) hours as needed.  5  . ondansetron (ZOFRAN ODT) 4 MG disintegrating tablet Take 1 tablet (4 mg total) by mouth every 8 (eight) hours as needed for nausea or vomiting. 10 tablet 0  . pseudoephedrine (SUDAFED) 30 MG tablet Take 30 mg by mouth every 4 (four) hours as needed for congestion.    . rosuvastatin (CRESTOR) 5 MG tablet Take 5 mg by mouth 4 (four) times a week.    . Vitamin D, Ergocalciferol, (DRISDOL) 50000 units CAPS capsule Take 1 capsule (50,000 Units total) by mouth every 14 (fourteen) days. 30 capsule 3  . oxyCODONE-acetaminophen (PERCOCET) 5-325 MG tablet Take 1 tablet by mouth every 4 (four) hours as needed. (Patient not taking: Reported on 03/28/2016) 17 tablet 0   No current facility-administered medications for this visit.     Family History  Problem Relation Age of Onset  . Cancer Mother 38    ovarian cancer  . Hypertension Mother   . Diabetes Mother   . Alcohol abuse Father     ROS:  Pertinent items are noted in HPI.  Otherwise, a comprehensive ROS was negative.  Exam:   BP 110/70 (BP Location: Right Arm, Patient Position: Sitting, Cuff Size: Normal)   Pulse 84   Resp 16   Ht 5' 5.5" (1.664 m)   Wt 137 lb (62.1 kg)   LMP 09/26/2013 (Exact Date)   BMI 22.45 kg/m  Height: 5' 5.5" (166.4 cm) Ht Readings from Last 3 Encounters:  03/28/16 5' 5.5" (1.664 m)  02/22/16 '5\' 6"'$  (1.676 m)  12/25/15 5' 5.5" (1.664 m)    General appearance: alert, cooperative and appears stated age Head: Normocephalic, without obvious abnormality, atraumatic Neck: no adenopathy, supple, symmetrical, trachea midline and thyroid normal to inspection and palpation Lungs: clear to auscultation bilaterally Breasts: normal appearance, no masses or tenderness Heart: regular rate and rhythm Abdomen: soft,  non-tender; no masses,  no organomegaly Extremities: extremities normal, atraumatic, no cyanosis or edema Skin: Skin color, texture, turgor normal. No rashes or lesions Lymph nodes: Cervical, supraclavicular, and axillary nodes normal. No abnormal inguinal nodes palpated Neurologic: Grossly normal   Pelvic: External genitalia:  no lesions              Urethra:  normal appearing urethra with no masses, tenderness or lesions  Bartholin's and Skene's: normal                 Vagina: normal appearing vagina with normal color and discharge, no lesions              Cervix: anteverted              Pap taken: No. Bimanual Exam:  Uterus:  normal size, contour, position, consistency, mobility, non-tender              Adnexa: no mass, fullness, tenderness               Rectovaginal: Confirms               Anus:  normal sphincter tone, no lesions  Chaperone present: yes  A:  Well Woman with normal exam  Postmenopausal off HRT 1995 - 12/02 S/P LUL Lobectomy for Lung cancer stage I A- 05/2003, followed by Oncology Vit D deficiency PMB with negative endo biopsy and PUS 10/29 & 12/21/13 S/P right breast lumpectomy 04/11/13 with intraductal papilloma with atypical ductal hyperplasia with wide excision             01/2015 New right lung nodule with recent test (Bronchoscopy, tissue biopsy, AFB culture)  inconclusive - now with close follow up with new nodule on the left    P:   Reviewed health and wellness pertinent to exam  Pap smear not done  Mammogram is due 05/2016  Refill Vit D and follow with labs.  Counseled on breast self exam, mammography screening, adequate intake of calcium and vitamin D, diet and exercise, Kegel's exercises return annually or prn  An After Visit Summary was printed and given to the patient.

## 2016-03-28 ENCOUNTER — Encounter: Payer: Self-pay | Admitting: Nurse Practitioner

## 2016-03-28 ENCOUNTER — Ambulatory Visit (INDEPENDENT_AMBULATORY_CARE_PROVIDER_SITE_OTHER): Payer: Medicare Other | Admitting: Nurse Practitioner

## 2016-03-28 VITALS — BP 110/70 | HR 84 | Resp 16 | Ht 65.5 in | Wt 137.0 lb

## 2016-03-28 DIAGNOSIS — E559 Vitamin D deficiency, unspecified: Secondary | ICD-10-CM | POA: Diagnosis not present

## 2016-03-28 DIAGNOSIS — Z01411 Encounter for gynecological examination (general) (routine) with abnormal findings: Secondary | ICD-10-CM | POA: Diagnosis not present

## 2016-03-28 DIAGNOSIS — E2839 Other primary ovarian failure: Secondary | ICD-10-CM

## 2016-03-28 DIAGNOSIS — Z85118 Personal history of other malignant neoplasm of bronchus and lung: Secondary | ICD-10-CM | POA: Diagnosis not present

## 2016-03-28 DIAGNOSIS — J438 Other emphysema: Secondary | ICD-10-CM

## 2016-03-28 MED ORDER — VITAMIN D (ERGOCALCIFEROL) 1.25 MG (50000 UNIT) PO CAPS: 50000.0000 [IU] | ORAL_CAPSULE | ORAL | 3 refills | Status: DC

## 2016-03-28 NOTE — Patient Instructions (Signed)

## 2016-03-29 LAB — VITAMIN D 25 HYDROXY (VIT D DEFICIENCY, FRACTURES): Vit D, 25-Hydroxy: 47 ng/mL (ref 30–100)

## 2016-03-30 NOTE — Progress Notes (Signed)
Encounter reviewed by Dr. Brook Amundson C. Silva.  

## 2016-04-01 ENCOUNTER — Ambulatory Visit
Admission: RE | Admit: 2016-04-01 | Discharge: 2016-04-01 | Disposition: A | Discharge status: Home / Self Care | Payer: Medicare Other | Source: Ambulatory Visit | Attending: Thoracic Surgery (Cardiothoracic Vascular Surgery) | Admitting: Thoracic Surgery (Cardiothoracic Vascular Surgery)

## 2016-04-01 ENCOUNTER — Ambulatory Visit (INDEPENDENT_AMBULATORY_CARE_PROVIDER_SITE_OTHER): Payer: Medicare Other | Admitting: Thoracic Surgery (Cardiothoracic Vascular Surgery)

## 2016-04-01 ENCOUNTER — Encounter: Payer: Self-pay | Admitting: Thoracic Surgery (Cardiothoracic Vascular Surgery)

## 2016-04-01 VITALS — BP 110/70 | HR 92 | Resp 20 | Ht 65.5 in | Wt 137.0 lb

## 2016-04-01 DIAGNOSIS — Z85118 Personal history of other malignant neoplasm of bronchus and lung: Secondary | ICD-10-CM | POA: Diagnosis not present

## 2016-04-01 DIAGNOSIS — D381 Neoplasm of uncertain behavior of trachea, bronchus and lung: Secondary | ICD-10-CM

## 2016-04-01 DIAGNOSIS — R918 Other nonspecific abnormal finding of lung field: Secondary | ICD-10-CM

## 2016-04-01 NOTE — Progress Notes (Signed)
[address]       [place] [address]             [phone]    HPI: Tammy Eaton returns for a scheduled 3 month follow-up visit  She is a 66 year old woman with a history of tobacco abuse, COPD, and stage 1A lung cancer treated with left upper lobectomy in 2005. She quit smoking in 2010. In October 2016 she was found to have a new right upper lobe lung nodule in the background of severe emphysema. She was treated with antibiotics but the nodule increased in size. A PET CT was hypermetabolic but radiology felt this was infectious or inflammatory in nature. I did a navigational bronchoscopy which showed inflammation. Cultures were negative. We have been following the nodule since then.   I last saw her in November 2017. The RUL nodule was slightly smaller but there was a new finding in the left lower lobe.  In the interim since her last visit, she has had a rough time. She had the flu about 6 weeks ago and was severely ill with that. More recently she's had a kidney stone. Currently she feels better but is still recovering from the after effects of the flu. She has had some cough, but that is improving.  Past Medical History:  Diagnosis Date  . Anxiety   . Arthritis   . Complication of anesthesia    "low blood pressure after retinal detachment surgery"  . COPD (chronic obstructive pulmonary disease) ([place])   . Depression   . Emphysema of lung ([place])   . History of bronchitis   . History of pneumonia    "multiple times"  . Hyperlipidemia   . Hyperlipidemia   . Kidney stones 02/2016  . lung ca 05/2003  . Lung cancer ([place])    s/p LUL lobectomy  . Osteoarthritis    spine, knees, hands  . PONV (postoperative nausea and vomiting)    pt "could not eat during hospital stay"  . Reflux esophagitis   . Rhinitis   . Shortness of breath   . Wears glasses     Current Outpatient Prescriptions  Medication Sig Dispense Refill  . acetaminophen (TYLENOL) 325 MG tablet  Take 650 mg by mouth every 6 (six) hours as needed.    Marland Kitchen albuterol (PROVENTIL HFA;VENTOLIN HFA) 108 (90 BASE) MCG/ACT inhaler Inhale 2 puffs into the lungs every 6 (six) hours as needed for wheezing or shortness of breath.    . ALPRAZolam (XANAX) 0.5 MG tablet Take 0.5 mg by mouth 3 (three) times daily as needed. For anxiety    . Biotin 5000 MCG CAPS Take 5,000 mcg by mouth 2 (two) times daily.    . Coenzyme Q10 (CO Q 10) 100 MG CAPS Take 400 mg by mouth daily.    . CYMBALTA 60 MG capsule Take 60 mg by mouth daily.     . Fluticasone Furoate-Vilanterol (BREO ELLIPTA) 100-25 MCG/INH AEPB Inhale 1 puff into the lungs daily.     . Glucosamine 500 MG CAPS Take 500 mg by mouth 3 (three) times daily.    . meloxicam (MOBIC) 15 MG tablet Take 1 tablet by mouth daily as needed. Reported on 06/26/2015  3  . Meth-Hyo-M Bl-Na Phos-Ph Sal (URO-458) 81 MG TABS Take 1 tablet by mouth every 6 (six) hours as needed.  5  . ondansetron (ZOFRAN ODT) 4 MG disintegrating tablet Take 1 tablet (4 mg total) by mouth every 8 (eight)  hours as needed for nausea or vomiting. 10 tablet 0  . oxyCODONE-acetaminophen (PERCOCET) 5-325 MG tablet Take 1 tablet by mouth every 4 (four) hours as needed. 17 tablet 0  . pseudoephedrine (SUDAFED) 30 MG tablet Take 30 mg by mouth every 4 (four) hours as needed for congestion.    . rosuvastatin (CRESTOR) 5 MG tablet Take 5 mg by mouth 4 (four) times a week.    . Vitamin D, Ergocalciferol, (DRISDOL) 50000 units CAPS capsule Take 1 capsule (50,000 Units total) by mouth every 14 (fourteen) days. 30 capsule 3   No current facility-administered medications for this visit.     Physical Exam BP 110/70   Pulse 92   Resp 20   Ht 5' 5.5" (1.664 m)   Wt 137 lb (62.1 kg)   LMP 09/26/2013 (Exact Date)   SpO2 93% Comment: RA  BMI 22.65 kg/m  66 year old woman in no acute distress Alert and oriented 3 with no focal deficits Cardiac regular rate and rhythm normal S1 and S2 Lungs distant breath  sounds bilaterally, no wheezing No cervical or supraclavicular adenopathy  Diagnostic Tests: CT CHEST WITHOUT CONTRAST  TECHNIQUE: Multidetector CT imaging of the chest was performed following the standard protocol without IV contrast.  COMPARISON:  12/25/2015, 03/06/2015, 01/02/2015, 12/08/2012.  FINDINGS: Cardiovascular: Heart is normal size. Diffuse coronary artery and aortic atherosclerosis. No evidence of aortic aneurysm.  Mediastinum/Nodes: No mediastinal, hilar, or axillary adenopathy.  Lungs/Pleura: Biapical scarring, right greater than left. Advanced changes of centrilobular emphysema. Areas of nodularity again noted in the right upper lobe, stable. Elongated nodule in the left upper lobe again noted, measuring up to 16 mm in greatest dimension, stable. No new or enlarging pulmonary nodules. Areas of scarring in the lung bases bilaterally. No pleural effusions.  Upper Abdomen: Imaging into the upper abdomen shows no acute findings. Prior cholecystectomy  Musculoskeletal: Chest wall soft tissues are unremarkable. No acute bony abnormality.  IMPRESSION: Advanced centrilobular emphysema.  Stable bilateral upper lobe pulmonary nodules. These have been stable dating back to 12/25/2015. Recommend continued follow-up with repeat CT in 1 year.   Electronically Signed   By: Rolm Baptise M.D.   On: 04/01/2016 10:56 I personally reviewed the CT images and concur with the findings noted above. No interval change in the lung nodules.  Impression: Mrs. Silfies is a 66 year old woman with a history of lung cancer 13 years ago. She has multiple small irregular pulmonary nodules. The nodules have been stable over the past 3 months. The right upper lobe nodule has been biopsied and showed only inflammatory changes. The left upper lobe nodules new finding 3 months ago.  I recommended to her that we have interval follow-up of the nodules in 6 months. That will be 2  years from the original finding of the right upper lobe nodule and almost a year for the left upper lobe.  Flu- had a recent bout of the flu. She's still recovering from that.  Tobacco abuse- quit smoking in 2010.  Plan: Return in 6 months with CT chest  [name], MD [facility] [phone]

## 2016-04-06 IMAGING — CR DG CHEST 2V
2 series · 2 of 2 positions shown · non-contrast
Comparison: CT of the chest performed 01/02/2015

CLINICAL DATA: Preoperative chest radiograph, prior to
bronchoscopy. Initial encounter.

EXAM:
CHEST  2 VIEW

[w chest pa]
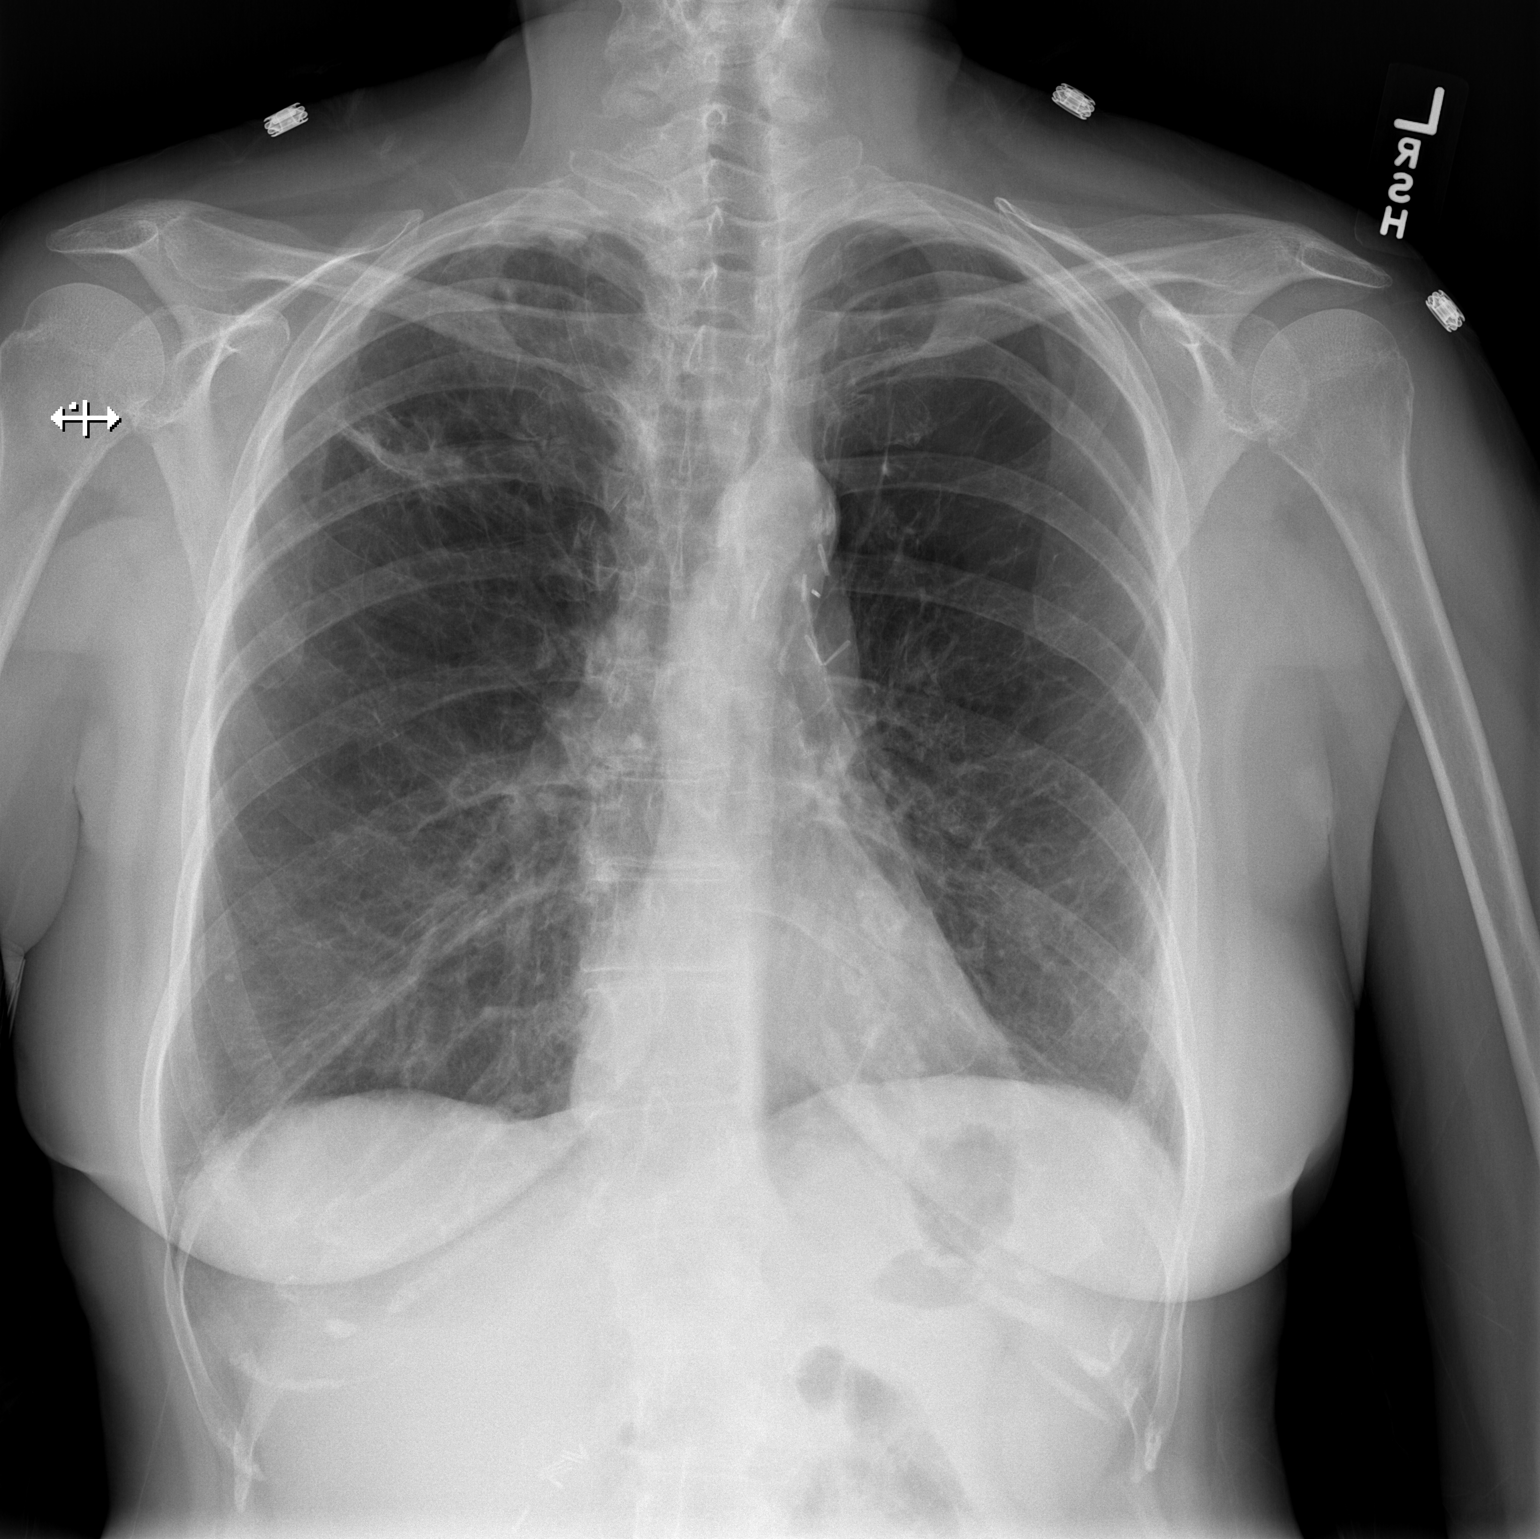

[w chest lat]
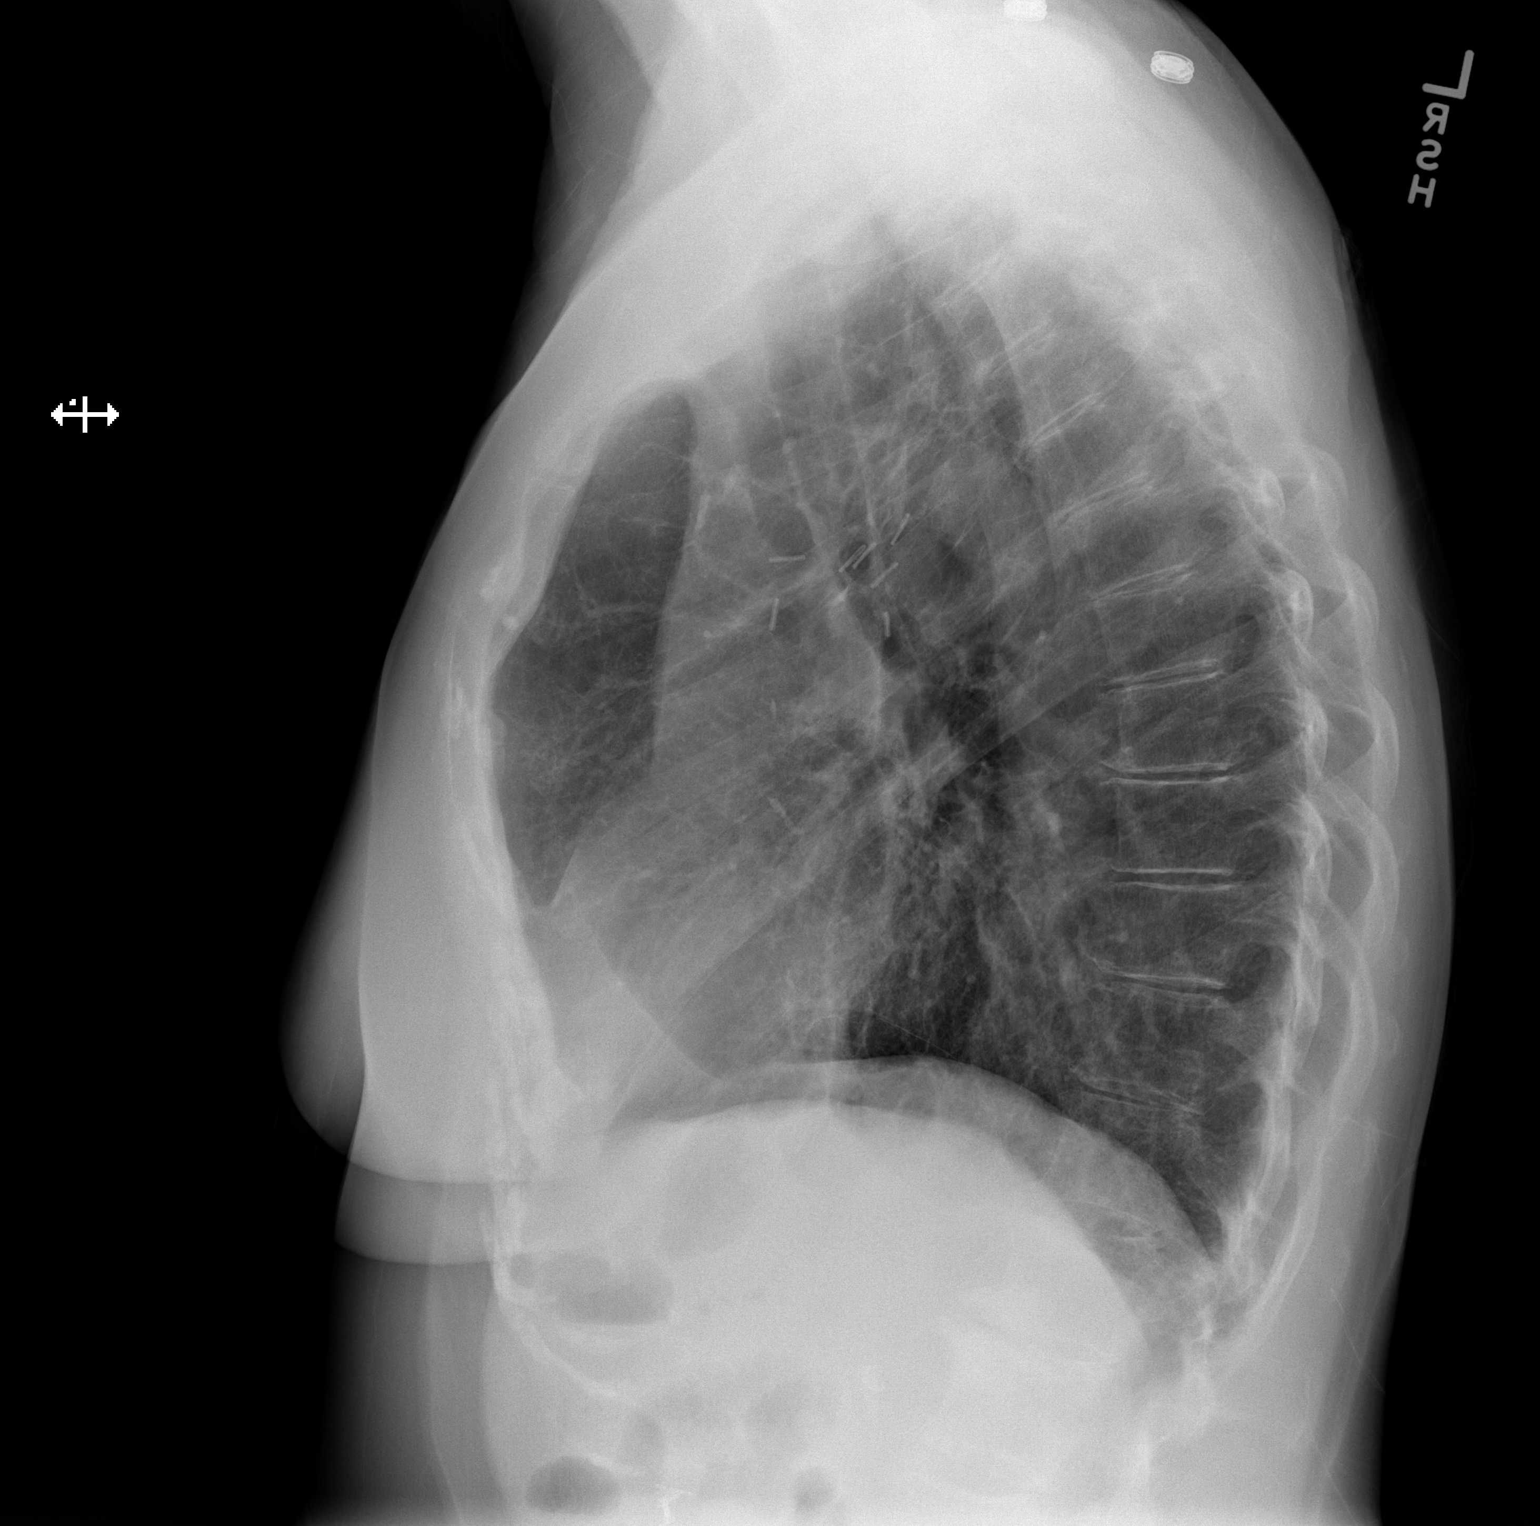

[2 of 2 positions shown; findings below may reference images not displayed]

FINDINGS: The previously noted right apical lesion is again seen, less well
defined on radiograph. Mild right apical scarring is noted. Mild
bibasilar atelectasis is seen. No pleural effusion or pneumothorax
is identified.

The heart is normal in size. Postoperative change is noted about the
mediastinum. No acute osseous abnormalities are identified. Clips
are noted within the right upper quadrant, reflecting prior
cholecystectomy.
IMPRESSION: Previously noted right apical lesion again noted, less well defined
on radiograph. Mild right apical scarring noted. Mild bibasilar
atelectasis seen.

## 2016-05-22 ENCOUNTER — Other Ambulatory Visit: Payer: Self-pay | Admitting: Obstetrics & Gynecology

## 2016-05-22 DIAGNOSIS — Z1231 Encounter for screening mammogram for malignant neoplasm of breast: Secondary | ICD-10-CM

## 2016-06-10 ENCOUNTER — Ambulatory Visit
Admission: RE | Admit: 2016-06-10 | Discharge: 2016-06-10 | Disposition: A | Discharge status: Home / Self Care | Payer: Medicare Other | Source: Ambulatory Visit | Attending: Nurse Practitioner | Admitting: Nurse Practitioner

## 2016-06-10 ENCOUNTER — Ambulatory Visit
Admission: RE | Admit: 2016-06-10 | Discharge: 2016-06-10 | Disposition: A | Discharge status: Home / Self Care | Payer: Medicare Other | Source: Ambulatory Visit | Attending: Obstetrics & Gynecology | Admitting: Obstetrics & Gynecology

## 2016-06-10 DIAGNOSIS — Z1231 Encounter for screening mammogram for malignant neoplasm of breast: Secondary | ICD-10-CM

## 2016-06-10 DIAGNOSIS — E2839 Other primary ovarian failure: Secondary | ICD-10-CM

## 2016-07-10 ENCOUNTER — Other Ambulatory Visit: Payer: Self-pay | Admitting: Orthopedic Surgery

## 2016-07-10 DIAGNOSIS — M4726 Other spondylosis with radiculopathy, lumbar region: Secondary | ICD-10-CM

## 2016-07-12 ENCOUNTER — Ambulatory Visit
Admission: RE | Admit: 2016-07-12 | Discharge: 2016-07-12 | Disposition: A | Discharge status: Home / Self Care | Payer: Medicare Other | Source: Ambulatory Visit | Attending: Orthopedic Surgery | Admitting: Orthopedic Surgery

## 2016-07-12 DIAGNOSIS — M4726 Other spondylosis with radiculopathy, lumbar region: Secondary | ICD-10-CM

## 2016-08-14 ENCOUNTER — Other Ambulatory Visit: Payer: Self-pay | Admitting: Thoracic Surgery (Cardiothoracic Vascular Surgery)

## 2016-08-14 DIAGNOSIS — R918 Other nonspecific abnormal finding of lung field: Secondary | ICD-10-CM

## 2016-09-15 IMAGING — CT CT CHEST W/O CM
3 of 4 series · 17 of 30 positions shown, 19 images · non-contrast
Comparison: 03/06/2015

CLINICAL DATA: Followup lung nodules

EXAM:
CT CHEST WITHOUT CONTRAST
TECHNIQUE: Multidetector CT imaging of the chest was performed following the
standard protocol without IV contrast.

[Series 3: chest w/o · axial · non-contrast · 0.70mm/px · z∈[-281,-28]mm · 7 of 135 slices shown, 9 images]
[im 17/135  mediastinal]
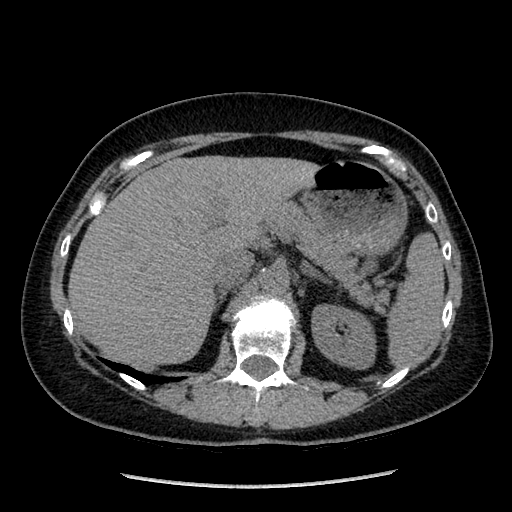
[im 17/135  lung]
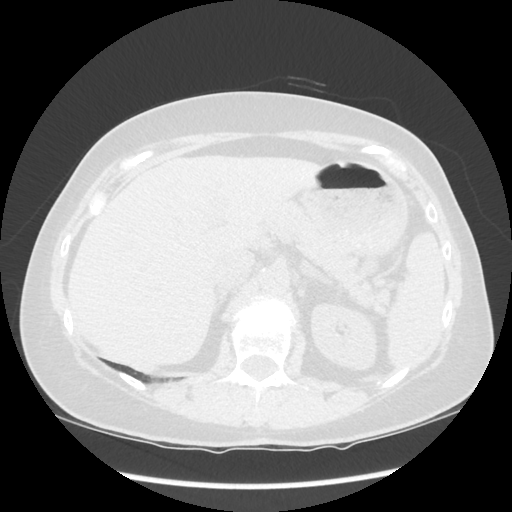
[im 34/135  lung]
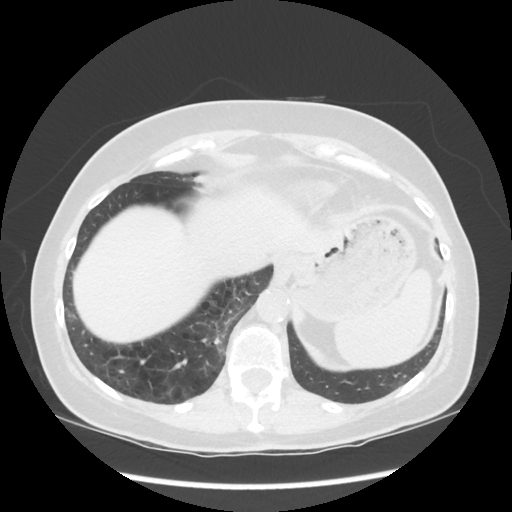
[im 51/135  lung]
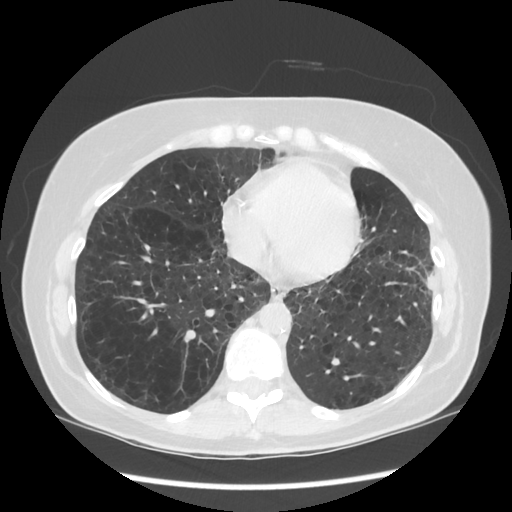
[im 68/135  lung]
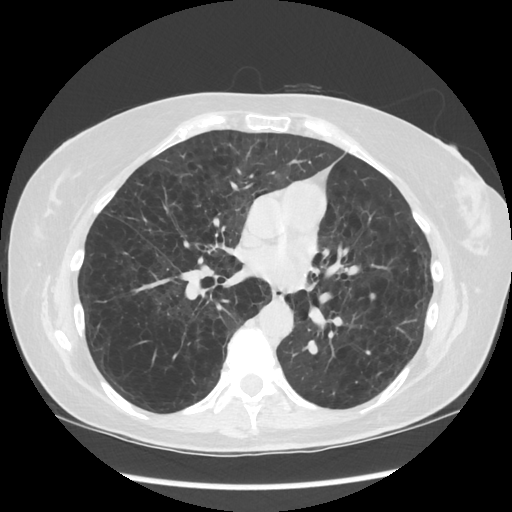
[im 84/135  mediastinal]
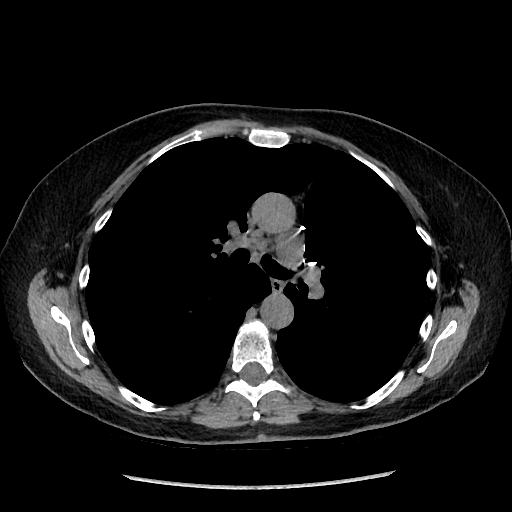
[im 84/135  lung]
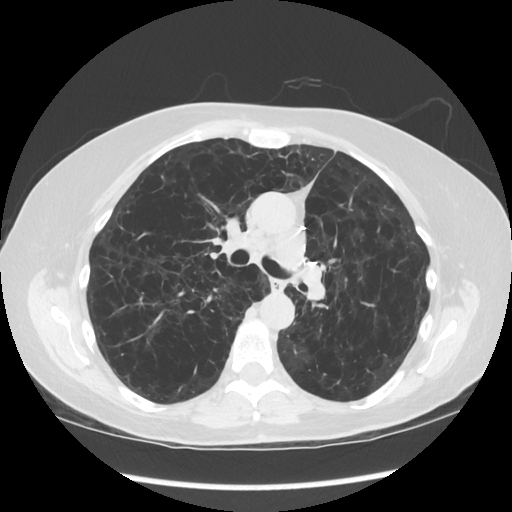
[im 101/135  lung]
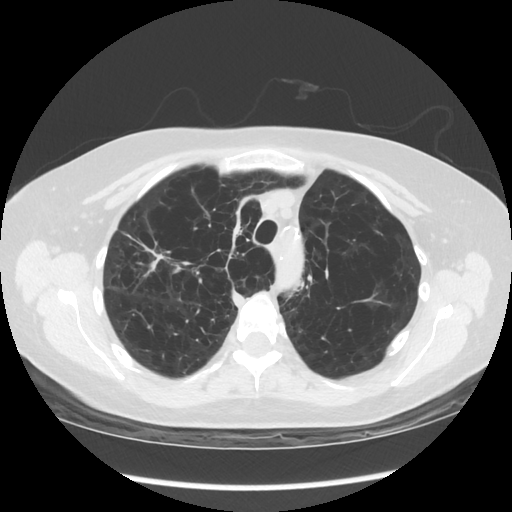
[im 118/135  lung]
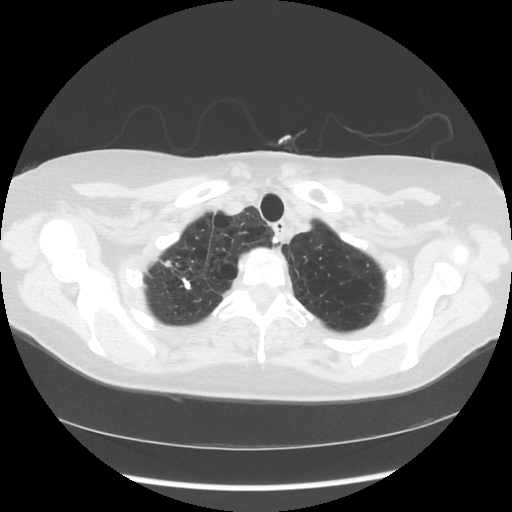

[Series 4: lung windows · axial · 0.70mm/px · z∈[-281,-28]mm · 7 of 135 slices shown]
[im 17/135  lung]
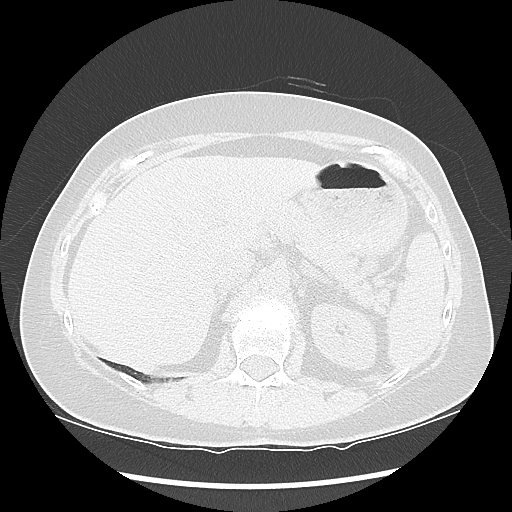
[im 34/135  lung]
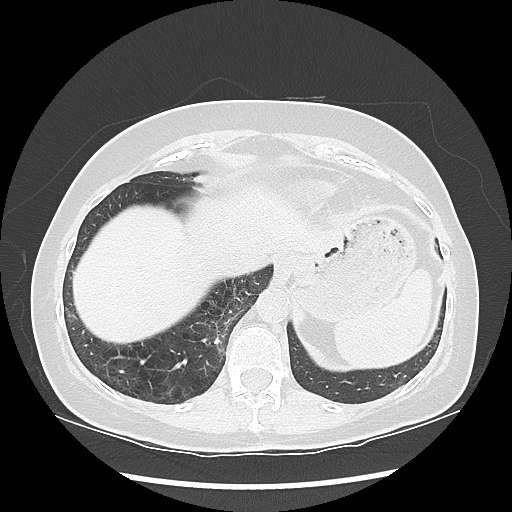
[im 51/135  lung]
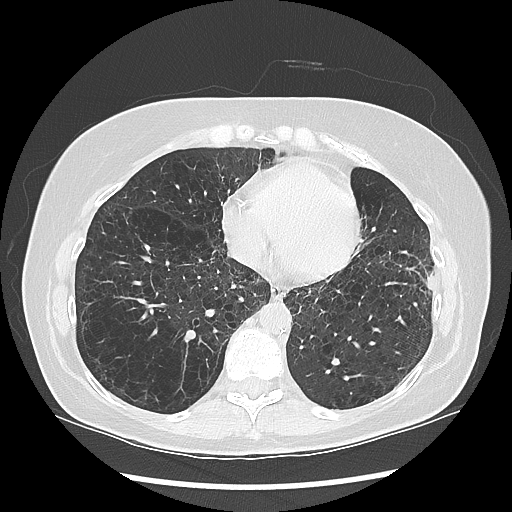
[im 68/135  lung]
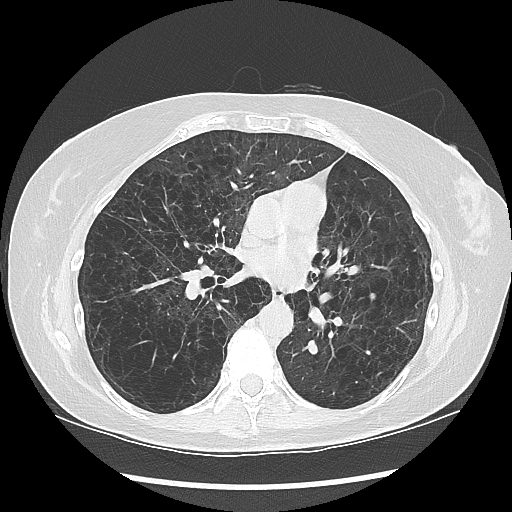
[im 84/135  lung]
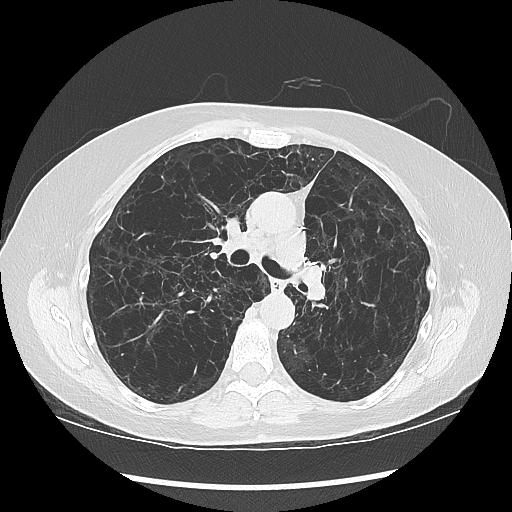
[im 101/135  lung]
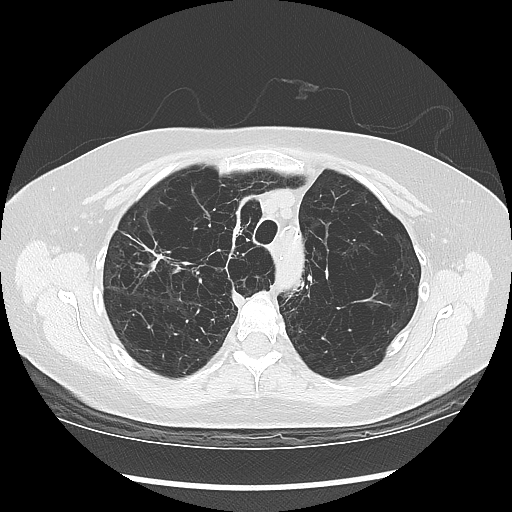
[im 118/135  lung]
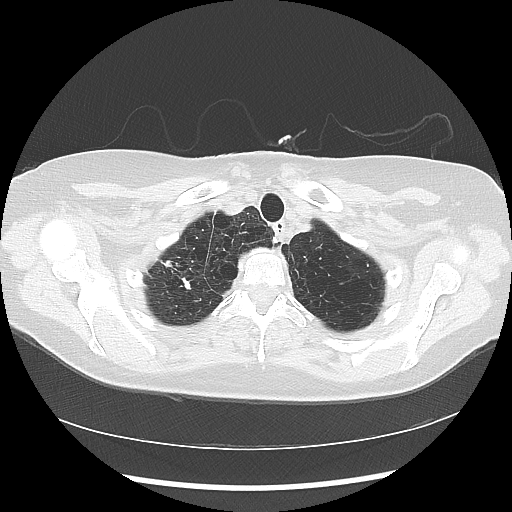

[Series 602: sagittal body · sagittal · 0.70mm/px · 3 of 145 slices shown]
[im 17/145  mediastinal]
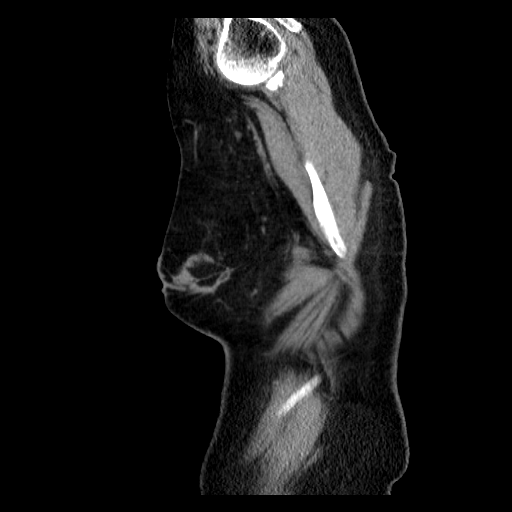
[im 33/145  mediastinal]
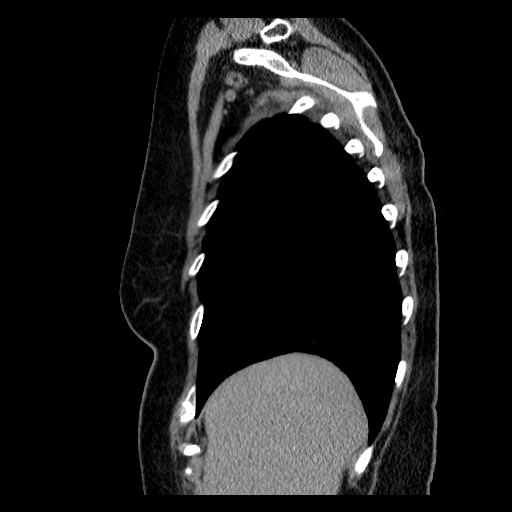
[im 49/145  mediastinal]
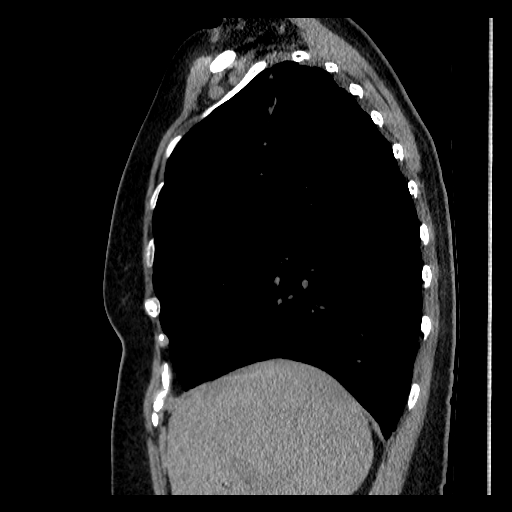

[17 of 30 positions shown; findings below may reference images not displayed]

FINDINGS: Mediastinum: The heart size appears normal. There is no pericardial
effusion identified. Aortic atherosclerosis is noted. Calcification
in the RCA, and LAD coronary artery noted. The trachea appears
patent and is midline. Unremarkable appearance of the esophagus.
There is no enlarged mediastinal or hilar adenopathy.

Lungs/Pleura: No pleural effusion identified. Advanced changes of
centrilobular emphysema identified. Scar, architectural distortion
and central nodule are again identified within the right upper lobe.
Index nodule within the right upper lobe measures 6 x 10 mm, image
25 of series 4. Previously 9 x 16 mm. No new or progressive
nodularity identified.

Upper Abdomen: Low density structure within the anterior aspect of
the left lobe of liver measures 8 mm and is unchanged, image 108 of
series 3. Normal appearance of the spleen. The adrenal glands are
unremarkable. The visualized portions of the left kidney are normal.

Musculoskeletal: Spondylosis is noted within the thoracic spine. No
aggressive lytic or sclerotic bone lesions chronic healed deformity
involving the sternum.
IMPRESSION: 1. There has been continued retraction of irregular right upper lobe
nodular density with surrounding scarring and architectural
distortion. No new or progressive pulmonary nodularity or adenopathy
identified.
2. Emphysema
3. Aortic atherosclerosis and coronary artery calcification.

## 2016-09-23 ENCOUNTER — Encounter: Payer: Self-pay | Admitting: Thoracic Surgery (Cardiothoracic Vascular Surgery)

## 2016-09-23 ENCOUNTER — Ambulatory Visit (INDEPENDENT_AMBULATORY_CARE_PROVIDER_SITE_OTHER): Payer: Medicare Other | Admitting: Thoracic Surgery (Cardiothoracic Vascular Surgery)

## 2016-09-23 ENCOUNTER — Ambulatory Visit
Admission: RE | Admit: 2016-09-23 | Discharge: 2016-09-23 | Disposition: A | Discharge status: Home / Self Care | Payer: Medicare Other | Source: Ambulatory Visit | Attending: Thoracic Surgery (Cardiothoracic Vascular Surgery) | Admitting: Thoracic Surgery (Cardiothoracic Vascular Surgery)

## 2016-09-23 VITALS — BP 107/53 | HR 77 | Resp 16 | Ht 65.5 in | Wt 140.0 lb

## 2016-09-23 DIAGNOSIS — R918 Other nonspecific abnormal finding of lung field: Secondary | ICD-10-CM

## 2016-09-23 DIAGNOSIS — Z85118 Personal history of other malignant neoplasm of bronchus and lung: Secondary | ICD-10-CM

## 2016-09-23 DIAGNOSIS — Z902 Acquired absence of lung [part of]: Secondary | ICD-10-CM | POA: Diagnosis not present

## 2016-09-23 NOTE — Progress Notes (Signed)
[address]       [place] [address]             [phone]    HPI: Tammy Eaton returns for a scheduled 6 month follow-up visit.  She is a 66 year old woman who had a left upper lobectomy for stage IA lung cancer in 2005. She quit smoking in 2010. In October 2016 she developed a new right upper lobe lung nodule in the setting of severe emphysema. Navigational bronchoscopy with biopsy showed inflammation. Cultures were negative. She's been followed with serial CT scans for multiple lung nodules since then.  She has been having problems with severe back pain. She recently has injections. She has not had any breathing issues. She does have an occasional cough. It has been nonproductive. Her appetite is good and she has not had any weight loss. Past Medical History:  Diagnosis Date  . Anxiety   . Arthritis   . Complication of anesthesia    "low blood pressure after retinal detachment surgery"  . COPD (chronic obstructive pulmonary disease) ([place])   . Depression   . Emphysema of lung ([place])   . History of bronchitis   . History of pneumonia    "multiple times"  . Hyperlipidemia   . Hyperlipidemia   . Kidney stones 02/2016  . lung ca 05/2003  . Lung cancer ([place])    s/p LUL lobectomy  . Osteoarthritis    spine, knees, hands  . PONV (postoperative nausea and vomiting)    pt "could not eat during hospital stay"  . Reflux esophagitis   . Rhinitis   . Shortness of breath   . Wears glasses      Current Outpatient Prescriptions  Medication Sig Dispense Refill  . acetaminophen (TYLENOL) 325 MG tablet Take 650 mg by mouth every 6 (six) hours as needed.    Marland Kitchen albuterol (PROVENTIL HFA;VENTOLIN HFA) 108 (90 BASE) MCG/ACT inhaler Inhale 2 puffs into the lungs every 6 (six) hours as needed for wheezing or shortness of breath.    . ALPRAZolam (XANAX) 0.5 MG tablet Take 0.5 mg by mouth 3 (three) times daily as needed. For anxiety    . Biotin 5000 MCG CAPS Take 5,000 mcg  by mouth 2 (two) times daily.    . Coenzyme Q10 (CO Q 10) 100 MG CAPS Take 400 mg by mouth daily.    . CYMBALTA 60 MG capsule Take 60 mg by mouth daily.     . Fluticasone Furoate-Vilanterol (BREO ELLIPTA) 100-25 MCG/INH AEPB Inhale 1 puff into the lungs daily.     . Glucosamine 500 MG CAPS Take 500 mg by mouth 3 (three) times daily.    . meloxicam (MOBIC) 15 MG tablet Take 1 tablet by mouth daily as needed. Reported on 06/26/2015  3  . Meth-Hyo-M Bl-Na Phos-Ph Sal (URO-458) 81 MG TABS Take 1 tablet by mouth every 6 (six) hours as needed.  5  . ondansetron (ZOFRAN ODT) 4 MG disintegrating tablet Take 1 tablet (4 mg total) by mouth every 8 (eight) hours as needed for nausea or vomiting. 10 tablet 0  . oxyCODONE-acetaminophen (PERCOCET) 5-325 MG tablet Take 1 tablet by mouth every 4 (four) hours as needed. 17 tablet 0  . pseudoephedrine (SUDAFED) 30 MG tablet Take 30 mg by mouth every 4 (four) hours as needed for congestion.    . rosuvastatin (CRESTOR) 5 MG tablet Take 5 mg by mouth 4 (four) times a week.    Marland Kitchen  Vitamin D, Ergocalciferol, (DRISDOL) 50000 units CAPS capsule Take 1 capsule (50,000 Units total) by mouth every 14 (fourteen) days. 30 capsule 3   No current facility-administered medications for this visit.     Physical Exam BP (!) 107/53 (BP Location: Right Arm, Patient Position: Sitting, Cuff Size: Large)   Pulse 77   Resp 16   Ht 5' 5.5" (1.664 m)   Wt 140 lb (63.5 kg)   LMP 09/26/2013 (Exact Date)   SpO2 96% Comment: RA  BMI 22.44 kg/m  66 year old woman in no acute distress Alert and oriented 3 with no focal deficits Lungs diminished at left base, otherwise clear Cardiac regular rate and rhythm normal S1 and S2 No cervical or subclavicular adenopathy. Diagnostic Tests: CT CHEST WITHOUT CONTRAST  TECHNIQUE: Multidetector CT imaging of the chest was performed following the standard protocol without IV contrast.  COMPARISON:  04/01/2016 chest  CT.  FINDINGS: Cardiovascular: Normal heart size. No significant pericardial fluid/thickening. Left main, left anterior descending, left circumflex and right coronary atherosclerosis. Atherosclerotic nonaneurysmal thoracic aorta. Normal caliber pulmonary arteries.  Mediastinum/Nodes: Stable subcentimeter coarsely calcified right thyroid lobe nodule. Unremarkable esophagus. No pathologically enlarged axillary, mediastinal or gross hilar lymph nodes, noting limited sensitivity for the detection of hilar adenopathy on this noncontrast study.  Lungs/Pleura: No pneumothorax. No pleural effusion. Status post left upper lobectomy. Severe centrilobular and paraseptal emphysema with diffuse bronchial wall thickening. Mildly irregular parenchymal band in the right upper lobe is unchanged and compatible with postinfectious/ postinflammatory scarring. Anterior left lower lobe 18 x 4 mm (average diameter 11 mm) pulmonary nodule (series 4/ image 46) is stable since 12/25/2015. Peripheral subpleural left lower lobe irregular 2.1 x 0.9 cm focus of consolidation (series 4/image 88), previously 1.3 x 0.5 cm on 04/01/2016, 2.4 x 0.9 cm on 12/25/2015 and 1.2 x 0.6 cm on 03/06/2015. No acute consolidative airspace disease or additional significant pulmonary nodules.  Upper abdomen: Cholecystectomy. Subcentimeter hypodense renal cortical lesion in the medial upper left kidney is too small to characterize and requires no further follow-up.  Musculoskeletal: No aggressive appearing focal osseous lesions. Moderate thoracic spondylosis. Healed deformity in the sternum.  IMPRESSION: 1. Stable postinfectious/postinflammatory scar in the right upper lobe. 2. Stable anterior left lower lobe pulmonary nodule average diameter 11 mm, for which 9 month stability has been demonstrated, probably benign. 3. Irregular 2.1 x 0.9 cm focus of consolidation in the subpleural peripheral left lower lobe, which has  been mildly waxing and waning on multiple chest CT studies back to 03/06/2015, more suggestive of a benign etiology such as focal scarring with variable atelectasis. Continued chest CT surveillance is warranted in 6 months. 4. No adenopathy or other findings of metastatic disease in the chest . 5. Left main and 3 vessel coronary atherosclerosis .  Aortic Atherosclerosis (ICD10-I70.0) and Emphysema (ICD10-J43.9).   Electronically Signed   By: Ilona Sorrel M.D.   On: 09/23/2016 10:41 I personally reviewed the CT chest and concur with the findings noted above.  Impression: Tammy Eaton is a 66 year old woman with a remote history of lung cancer who has multiple pulmonary nodules. Her right upper lobe nodule has been biopsied in showed inflammation. That has been stable on serial CTs and is consistent with scarring. She has a nodule in the anterior left lower lobe that has been stable as well. There has been some change on this irregular subpleural nodule in the left lower lobe, but that has waxed and waned over several CTs now in radiology feels that  most likely is scarring with surrounding atelectasis.  I think she is safe this point to continue with radiographic follow-up. We did discuss the possibility of attempting to biopsy or resect the left lower lobe nodule that waxes and wanes. She's not interested in that at this time.  Back pain- she is in the process of having that evaluated and treated.  Plan: Return in 6 months with CT chest  [name], MD [facility] [phone]

## 2016-10-16 NOTE — Progress Notes (Signed)
Tammy Eaton    HEMATOLOGY/ONCOLOGY CLINIC FOLLOWUP NOTE  Date of Service: 10/29/16.   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  INTERVAL HISTORY:   Tammy Eaton is here for scheduled follow on previous history of lung cancer and specifically  f/u of her right upper lobe lung nodule. She had a CT chest  WITHOUT CONTRAST on 09/23/2016 with results revealing: IMPRESSION: 1. Stable postinfectious/postinflammatory scar in the right upper Lobe. 2. Stable anterior left lower lobe pulmonary nodule average diameter 11 mm, for which 9 month stability has been demonstrated, probably benign. 3. Irregular 2.1 x 0.9 cm focus of consolidation in the subpleural peripheral left lower lobe, which has been mildly waxing and waning on multiple chest CT studies back to 03/06/2015, more suggestive of a benign etiology such as focal scarring with variable atelectasis. Continued chest CT surveillance is warranted in 6 months. Was seen by Dr Roxan Hockey and imaging f/u in 6 months recommended.    She reports that she is doing well overall, however she notes that she is experiencing lower back pain. Pt notes that her lower back pain is exacerbated with movement. She reports that her lower back pain radiates to her right hip. Pt states that she has been evaluated by a Provider at [facility]. Pt states that she is starting with minimally invasive procedures for her lower back pain prior to surgery. Pt notes that she starts Physical Therapy next week for her ongoing lower back pain. Pt reports that she watches her  grandchildren occasionally. Pt denies being on prescription medications, but notes that she used to take meloxicam, but it eventually irritated her stomach so she d/c use. Pt states that she now takes ibuprofen or tylenol PRN for her lower back pain. She reports that she hasn't tried Tramadol yet. Pt reports that she has had her flu vaccination this past year. Pt states she will f/u with PCP for her pneumonia vaccines.. She states that she is going to the [employer] soon for a [employer].   On review of systems she reports constant lower back pain. She denies decreased energy levels, fatigue, or decreased appetite. She denies abdominal pain, HA, or leg swelling.    MEDICAL HISTORY:  Past Medical History:  Diagnosis Date  . Anxiety   . Arthritis   . Complication of anesthesia    "low blood pressure after retinal detachment surgery"  . COPD (chronic obstructive pulmonary disease) ([place])   . Depression   . Emphysema of lung ([place])   . History of bronchitis   . History of pneumonia    "multiple times"  . Hyperlipidemia   . Hyperlipidemia   . Kidney stones 02/2016  . lung ca 05/2003  . Lung cancer ([place])    s/p LUL lobectomy  . Osteoarthritis    spine, knees, hands  . PONV (postoperative nausea and vomiting)    pt "could not eat during hospital stay"  . Reflux esophagitis   . Rhinitis   . Shortness of breath   . Wears glasses     SURGICAL HISTORY: Past Surgical History:  Procedure Laterality Date  . BREAST EXCISIONAL  BIOPSY    . BREAST LUMPECTOMY WITH NEEDLE LOCALIZATION Right 04/11/2013   Procedure: BREAST LUMPECTOMY WITH NEEDLE LOCALIZATION;  Surgeon: Odis Hollingshead, MD;  Location: [place];  Service: General;  Laterality: Right;  . BREAST SURGERY  1992   lt br bx  . CHOLECYSTECTOMY  11/11/2011   Procedure: LAPAROSCOPIC CHOLECYSTECTOMY WITH INTRAOPERATIVE CHOLANGIOGRAM;  Surgeon: Odis Hollingshead, MD;  Location: [place];  Service: General;  Laterality:  N/A;  . COLONOSCOPY    . CYSTOSCOPY  12/10   for evaluation of hematuria  . DILATION AND CURETTAGE OF UTERUS    . ESOPHAGOGASTRODUODENOSCOPY    . EYE SURGERY     catataracts-both  . KNEE SURGERY     rt knee torn cartilage  . LUNG LOBECTOMY Left 4/05  . RETINAL DETACHMENT SURGERY  2011   left; x2  . SINUS SURGERY WITH INSTATRAK    . TUBAL LIGATION    . VIDEO BRONCHOSCOPY WITH ENDOBRONCHIAL NAVIGATION N/A 01/15/2015   Procedure: VIDEO BRONCHOSCOPY WITH ENDOBRONCHIAL NAVIGATION;  Surgeon: [name], MD;  Location: [place];  Service: Thoracic;  Laterality: N/A;  . VOCAL CORD LATERALIZATION, ENDOSCOPIC APPROACH W/ MLB     nodule removed    SOCIAL HISTORY: Social History   Social History  . Marital status: Married    Spouse name: N/A  . Number of children: 2  . Years of education: N/A   Occupational History  . Retired    Social History Main Topics  . Smoking status: Former Smoker    Packs/day: 2.00    Years: 40.00    Quit date: 09/21/2008  . Smokeless tobacco: Never Used  . Alcohol use Yes     Comment: SOCIALLY  . Drug use: No  . Sexual activity: No   Other Topics Concern  . Not on file   Social History Narrative  . No narrative on file    FAMILY HISTORY: Family History  Problem Relation Age of Onset  . Cancer Mother 69       ovarian cancer  . Hypertension Mother   . Diabetes Mother   . Alcohol abuse Father     ALLERGIES:  is allergic to adhesive [tape]; amoxicillin; septra [sulfamethoxazole-trimethoprim]; and oxycodone.  MEDICATIONS:  Current Outpatient Prescriptions  Medication Sig Dispense Refill  . acetaminophen (TYLENOL) 325 MG tablet Take 650 mg by mouth every 6 (six) hours as needed.    Tammy Eaton albuterol (PROVENTIL HFA;VENTOLIN HFA) 108 (90 BASE) MCG/ACT inhaler Inhale 2 puffs into the lungs every 6 (six) hours as needed for wheezing or shortness of breath.    . ALPRAZolam (XANAX) 0.5 MG tablet Take 0.5 mg by mouth 3 (three) times daily as  needed. For anxiety    . Biotin 5000 MCG CAPS Take 5,000 mcg by mouth 2 (two) times daily.    . Coenzyme Q10 (CO Q 10) 100 MG CAPS Take 400 mg by mouth daily.    . CYMBALTA 60 MG capsule Take 60 mg by mouth daily.     . Fluticasone Furoate-Vilanterol (BREO ELLIPTA) 100-25 MCG/INH AEPB Inhale 1 puff into the lungs daily.     . Glucosamine 500 MG CAPS Take 500 mg by mouth 3 (three) times daily.    . meloxicam (MOBIC) 15 MG tablet Take 1 tablet by mouth daily as needed. Reported on 06/26/2015  3  . Meth-Hyo-M Bl-Na Phos-Ph Sal (URO-458) 81 MG TABS Take 1 tablet by mouth every 6 (six) hours as needed.  5  .  ondansetron (ZOFRAN ODT) 4 MG disintegrating tablet Take 1 tablet (4 mg total) by mouth every 8 (eight) hours as needed for nausea or vomiting. 10 tablet 0  . oxyCODONE-acetaminophen (PERCOCET) 5-325 MG tablet Take 1 tablet by mouth every 4 (four) hours as needed. 17 tablet 0  . pseudoephedrine (SUDAFED) 30 MG tablet Take 30 mg by mouth every 4 (four) hours as needed for congestion.    . rosuvastatin (CRESTOR) 5 MG tablet Take 5 mg by mouth 4 (four) times a week.    . Vitamin D, Ergocalciferol, (DRISDOL) 50000 units CAPS capsule Take 1 capsule (50,000 Units total) by mouth every 14 (fourteen) days. 30 capsule 3   No current facility-administered medications for this visit.     REVIEW OF SYSTEMS:    10 Point review of Systems was done is negative except as noted above.  PHYSICAL EXAMINATION: ECOG PERFORMANCE STATUS: 1 - Symptomatic but completely ambulatory  . Vitals:   10/29/16 1040  BP: (!) 105/57  Pulse: 72  Resp: 18  Temp: 98.4 F (36.9 C)  SpO2: 94%   Filed Weights   10/29/16 1040  Weight: 146 lb 8 oz (66.5 kg)   .Body mass index is 24.01 kg/m.  GENERAL:alert, in no acute distress and comfortable SKIN: skin color, texture, turgor are normal, no rashes or significant lesions EYES: normal, conjunctiva are pink and non-injected, sclera clear OROPHARYNX:no exudate, no  erythema and lips, buccal mucosa, and tongue normal  NECK: supple, no JVD, thyroid normal size, non-tender, without nodularity LYMPH:  no palpable lymphadenopathy in the cervical, axillary or inguinal LUNGS: Decreased breathe sounds. No rales or rhonchi  HEART: regular rate & rhythm,  no murmurs and no lower extremity edema ABDOMEN: abdomen soft, non-tender, normoactive bowel sounds  Musculoskeletal: no cyanosis of digits and no clubbing. No focal spinal tenderness.  PSYCH: alert & oriented x 3 with fluent speech NEURO: no focal motor/sensory deficits  LABORATORY DATA:  I have reviewed the data as listed  . CBC Latest Ref Rng & Units 10/29/2016 02/22/2016 10/30/2015  WBC 3.9 - 10.3 10e3/uL 5.1 9.6 4.9  Hemoglobin 11.6 - 15.9 g/dL 14.9 15.4(H) 15.7  Hematocrit 34.8 - 46.6 % 44.9 45.7 47.8(H)  Platelets 145 - 400 10e3/uL 201 229 224    . CMP Latest Ref Rng & Units 10/29/2016 02/22/2016 10/30/2015  Glucose 70 - 140 mg/dl 112 115(H) 78  BUN 7.0 - 26.0 mg/dL 12.9 17 14.6  Creatinine 0.6 - 1.1 mg/dL 0.8 1.21(H) 0.8  Sodium 136 - 145 mEq/L 142 140 143  Potassium 3.5 - 5.1 mEq/L 3.9 4.4 4.5  Chloride 101 - 111 mmol/L - 107 -  CO2 22 - 29 mEq/L 28 27 26   Calcium 8.4 - 10.4 mg/dL 9.3 9.1 9.4  Total Protein 6.4 - 8.3 g/dL 6.5 6.7 6.8  Total Bilirubin 0.20 - 1.20 mg/dL 0.79 1.7(H) 1.16  Alkaline Phos 40 - 150 U/L 85 75 89  AST 5 - 34 U/L 15 30 25   ALT 0 - 55 U/L 11 37 31    RADIOGRAPHIC STUDIES: I have personally reviewed the radiological images as listed and agreed with the findings in the report.  CT CHEST WITHOUT CONTRAST 09/23/2016 IMPRESSION: 1. Stable postinfectious/postinflammatory scar in the right upper Lobe. 2. Stable anterior left lower lobe pulmonary nodule average diameter 11 mm, for which 9 month stability has been demonstrated, probably benign. 3. Irregular 2.1 x 0.9 cm focus of consolidation in the subpleural peripheral left lower lobe, which has been mildly  waxing and  waning on multiple chest CT studies back to 03/06/2015, more suggestive of a benign etiology such as focal scarring with variable atelectasis. Continued chest CT surveillance is warranted in 6 months. 4. No adenopathy or other findings of metastatic disease in the chest . 5. Left main and 3 vessel coronary atherosclerosis .  MM SCREENING BREAST TOMO BILATERAL 06/10/2016 IMPRESSION: No mammographic evidence of malignancy. A result letter of this screening mammogram will be mailed directly to the patient.  CT CHEST WITHOUT CONTRAST 04/01/2016 IMPRESSION: Advanced centrilobular emphysema. Stable bilateral upper lobe pulmonary nodules. These have been stable dating back to 12/25/2015. Recommend continued follow-up with repeat CT in 1 year.  CT CHEST WITHOUT CONTRAST 12/25/2015 IMPRESSION: 1. The index nodule in the right upper lobe is unchanged when compared with previous exam with an equivalent diameter of 8 mm. 2. There is a new elongated nodule within the left lung which has an equivalent diameter of 11 mm. This is indeterminate. Consider short-term follow-up in 3-6 months for PET-CT for further investigation. 3. Emphysema 4. Aortic atherosclerosis and coronary artery calcification.  CT CHEST WITHOUT CONTRAST 06/26/2015  TECHNIQUE: Multidetector CT imaging of the chest was performed following the standard protocol without IV contrast.  COMPARISON:  03/06/2015  FINDINGS: Mediastinum: The heart size appears normal. There is no pericardial effusion identified. Aortic atherosclerosis is noted. Calcification in the RCA, and LAD coronary artery noted. The trachea appears patent and is midline. Unremarkable appearance of the esophagus. There is no enlarged mediastinal or hilar adenopathy.  Lungs/Pleura: No pleural effusion identified. Advanced changes of centrilobular emphysema identified. Scar, architectural distortion and central nodule are again identified within the right  upper lobe. Index nodule within the right upper lobe measures 6 x 10 mm, image 25 of series 4. Previously 9 x 16 mm. No new or progressive nodularity identified.  Upper Abdomen: Low density structure within the anterior aspect of the left lobe of liver measures 8 mm and is unchanged, image 108 of series 3. Normal appearance of the spleen. The adrenal glands are unremarkable. The visualized portions of the left kidney are normal.  Musculoskeletal: Spondylosis is noted within the thoracic spine. No aggressive lytic or sclerotic bone lesions chronic healed deformity involving the sternum.  IMPRESSION: 1. There has been continued retraction of irregular right upper lobe nodular density with surrounding scarring and architectural distortion. No new or progressive pulmonary nodularity or adenopathy identified. 2. Emphysema 3. Aortic atherosclerosis and coronary artery calcification.   Electronically Signed   By: Kerby Moors M.D.   On: 06/26/2015 13:11  EXAM: 2D DIGITAL SCREENING BILATERAL MAMMOGRAM WITH CAD AND ADJUNCT TOMO  COMPARISON:  Previous exam(s).  ACR Breast Density Category c: The breast tissue is heterogeneously dense, which may obscure small masses.  FINDINGS: There are no findings suspicious for malignancy. Images were processed with CAD.  IMPRESSION: No mammographic evidence of malignancy. A result letter of this screening mammogram will be mailed directly to the patient.  RECOMMENDATION: Screening mammogram in one year. (Code:SM-B-01Y)  BI-RADS CATEGORY  1: Negative.   Electronically Signed   By: Lovey Newcomer M.D.   On: 06/01/2015 07:48   ASSESSMENT & PLAN:   66 year-old Caucasian female with history of COPD and prior history of left upper lobe stage IA non-small cell lung cancer status post left upper lobectomy here for her annual follow-up. CT showed evidence of 2 cm right upper lobe opacity. PET/CT scan did not showing definite  evidence of a neoplastic nodule and  cannot rule out the presence of inflammation/infection causing uneven FDG avidity.  #1 history of stage IA left upper lobe non-small cell lung cancer status post left upper lobectomy in 2005.  #2 new right upper lobe lung opacity - unresolved/slightly larger on f/u CT chest. Did not improve with steroids and empiric antibiotics. TB quantiferon assay neg. subsequent navigational bronchoscopy was negative for lung cancer. Follow-up CT scan on 03/06/2015 shows interval retraction of the irregular right upper lobe nodule with the imaging characteristics consistent with an inflammatory process. No other worrisome pulmonary nodules or adenopathy noted.  Repeat CT chest 06/26/2015 - continued retraction of the right upper lobe lesion with no new pulmonary nodules or adenopathy . Repeat CT chest on 09/23/2016-IMPRESSION: 1. Stable postinfectious/postinflammatory scar in the right upper lobe.   #3 history of COPD not on home oxygen severe emphysema. #4 ex-smoker quit in 2010. An 80-pack-year history of smoking. #5 atypical ductal hyperplasia - notes no new breast lumps on self-examination. Bilateral screening mammogram on 06/10/2016 with no evidence of concerning breast lesions. Plan -Patient has no clinical or radiographic evidence of lung cancer at this time and needs no other acute intervention. -She notes that she has follow-up with Dr. Jasmine Pang in Feburary 2019 with a repeat CT chest. -assuming no new issues with CT chest in 03/2017 with Dr. Roxan Hockey shall see her back for routine follow-up in 1 year. -Next mammogram due May 2019 with PCP.  Return to care in 1 year with Dr Irene Limbo for continued f/u.   All the patient's questions answered to her apparent satisfaction.  I spent 15 minutes counseling the patient face to face. The total time spent in the appointment was 20 minutes and more than 50% was on counseling and direct patient cares.    Sullivan Lone MD [place]  AAHIVMS [facility] Hematology/Oncology Physician [place]  (Office):       [phone] (Work cell):  [phone] (Fax):           [phone]  This document serves as a record of services personally performed by Sullivan Lone, MD. It was created on her behalf by Steva Colder, a trained medical scribe. The creation of this record is based on the scribe's personal observations and the provider's statements to them. This document has been checked and approved by the attending provider.

## 2016-10-29 ENCOUNTER — Ambulatory Visit (HOSPITAL_BASED_OUTPATIENT_CLINIC_OR_DEPARTMENT_OTHER): Payer: Medicare Other | Admitting: Hematology

## 2016-10-29 ENCOUNTER — Other Ambulatory Visit (HOSPITAL_BASED_OUTPATIENT_CLINIC_OR_DEPARTMENT_OTHER): Payer: Medicare Other

## 2016-10-29 ENCOUNTER — Telehealth: Payer: Self-pay | Admitting: Hematology

## 2016-10-29 ENCOUNTER — Encounter: Payer: Self-pay | Admitting: Hematology

## 2016-10-29 VITALS — BP 105/57 | HR 72 | Temp 98.4°F | Resp 18 | Ht 65.5 in | Wt 146.5 lb

## 2016-10-29 DIAGNOSIS — R911 Solitary pulmonary nodule: Secondary | ICD-10-CM

## 2016-10-29 DIAGNOSIS — C3412 Malignant neoplasm of upper lobe, left bronchus or lung: Secondary | ICD-10-CM

## 2016-10-29 DIAGNOSIS — J449 Chronic obstructive pulmonary disease, unspecified: Secondary | ICD-10-CM | POA: Diagnosis not present

## 2016-10-29 LAB — CBC & DIFF AND RETIC
BASO%: 0.6 % (ref 0.0–2.0)
Basophils Absolute: 0 10*3/uL (ref 0.0–0.1)
EOS%: 2.4 % (ref 0.0–7.0)
Eosinophils Absolute: 0.1 10*3/uL (ref 0.0–0.5)
HCT: 44.9 % (ref 34.8–46.6)
HGB: 14.9 g/dL (ref 11.6–15.9)
Immature Retic Fract: 7.6 % (ref 1.60–10.00)
LYMPH%: 26.2 % (ref 14.0–49.7)
MCH: 30 pg (ref 25.1–34.0)
MCHC: 33.2 g/dL (ref 31.5–36.0)
MCV: 90.3 fL (ref 79.5–101.0)
MONO#: 0.6 10*3/uL (ref 0.1–0.9)
MONO%: 12 % (ref 0.0–14.0)
NEUT%: 58.8 % (ref 38.4–76.8)
NEUTROS ABS: 3 10*3/uL (ref 1.5–6.5)
Platelets: 201 10*3/uL (ref 145–400)
RBC: 4.97 10*6/uL (ref 3.70–5.45)
RDW: 13.5 % (ref 11.2–14.5)
RETIC %: 1.92 % (ref 0.70–2.10)
RETIC CT ABS: 95.42 10*3/uL — AB (ref 33.70–90.70)
WBC: 5.1 10*3/uL (ref 3.9–10.3)
lymph#: 1.3 10*3/uL (ref 0.9–3.3)

## 2016-10-29 LAB — COMPREHENSIVE METABOLIC PANEL
ALT: 11 U/L (ref 0–55)
AST: 15 U/L (ref 5–34)
Albumin: 3.5 g/dL (ref 3.5–5.0)
Alkaline Phosphatase: 85 U/L (ref 40–150)
Anion Gap: 6 mEq/L (ref 3–11)
BUN: 12.9 mg/dL (ref 7.0–26.0)
CHLORIDE: 109 meq/L (ref 98–109)
CO2: 28 meq/L (ref 22–29)
Calcium: 9.3 mg/dL (ref 8.4–10.4)
Creatinine: 0.8 mg/dL (ref 0.6–1.1)
EGFR: 81 mL/min/{1.73_m2} — ABNORMAL LOW (ref >90)
GLUCOSE: 112 mg/dL (ref 70–140)
POTASSIUM: 3.9 meq/L (ref 3.5–5.1)
SODIUM: 142 meq/L (ref 136–145)
Total Bilirubin: 0.79 mg/dL (ref 0.20–1.20)
Total Protein: 6.5 g/dL (ref 6.4–8.3)

## 2016-10-29 NOTE — Telephone Encounter (Signed)
Gave patient AVS and calendar of upcoming September 2019 appointments.

## 2017-02-19 ENCOUNTER — Other Ambulatory Visit: Payer: Self-pay | Admitting: *Deleted

## 2017-02-19 DIAGNOSIS — R911 Solitary pulmonary nodule: Secondary | ICD-10-CM

## 2017-03-02 ENCOUNTER — Other Ambulatory Visit: Payer: Self-pay | Admitting: *Deleted

## 2017-03-02 MED ORDER — VITAMIN D (ERGOCALCIFEROL) 1.25 MG (50000 UNIT) PO CAPS: 50000.0000 [IU] | ORAL_CAPSULE | ORAL | 1 refills | Status: DC

## 2017-03-02 NOTE — Telephone Encounter (Signed)
Medication refill request: vitamin d  Last AEX:  03/28/16 PG  Next AEX: 04/07/17  Last MMG (if hormonal medication request): 06/10/16 BIRADS 1 negative  Refill authorized: 03/28/16 #30, 3RF. Today, please advise.

## 2017-03-24 ENCOUNTER — Ambulatory Visit
Admission: RE | Admit: 2017-03-24 | Discharge: 2017-03-24 | Disposition: A | Discharge status: Home / Self Care | Payer: Medicare Other | Source: Ambulatory Visit | Attending: Thoracic Surgery (Cardiothoracic Vascular Surgery) | Admitting: Thoracic Surgery (Cardiothoracic Vascular Surgery)

## 2017-03-24 ENCOUNTER — Encounter: Payer: Self-pay | Admitting: Thoracic Surgery (Cardiothoracic Vascular Surgery)

## 2017-03-24 ENCOUNTER — Ambulatory Visit: Payer: Medicare Other | Admitting: Thoracic Surgery (Cardiothoracic Vascular Surgery)

## 2017-03-24 VITALS — BP 100/64 | HR 64 | Resp 20 | Ht 65.5 in | Wt 137.0 lb

## 2017-03-24 DIAGNOSIS — R911 Solitary pulmonary nodule: Secondary | ICD-10-CM

## 2017-03-24 DIAGNOSIS — R918 Other nonspecific abnormal finding of lung field: Secondary | ICD-10-CM | POA: Diagnosis not present

## 2017-03-24 DIAGNOSIS — Z902 Acquired absence of lung [part of]: Secondary | ICD-10-CM

## 2017-03-24 DIAGNOSIS — Z85118 Personal history of other malignant neoplasm of bronchus and lung: Secondary | ICD-10-CM

## 2017-03-24 NOTE — Progress Notes (Signed)
[address]       [place] [address]             [phone]     HPI: Ms. Ardila returns for follow-up of lung nodules  Audrea Bolte is a 67 year old woman with a history of tobacco abuse (quit 2010), COPD, remote lung cancer in 2005, and chronic back pain.  She had a left upper lobectomy for stage Ia non-small cell carcinoma in 2005.  In October 2016 a CT showed a new right upper lobe lung nodule.  Navigational bronchoscopy showed inflammation.  Cultures were negative.  We have been following her with CT scans since then.  She has not had any recent issues with her breathing.  She is wondering if she should change from [facility] to a new inhaler.   She continues to have back pain.  She is scheduled to have a nerve ablation in the next couple of weeks.  Past Medical History:  Diagnosis Date  . Anxiety   . Arthritis   . Complication of anesthesia    "low blood pressure after retinal detachment surgery"  . COPD (chronic obstructive pulmonary disease) ([place])   . Depression   . Emphysema of lung ([place])   . History of bronchitis   . History of pneumonia    "multiple times"  . Hyperlipidemia   . Hyperlipidemia   . Kidney stones 02/2016  . lung ca 05/2003  . Lung cancer ([place])    s/p LUL lobectomy  . Osteoarthritis    spine, knees, hands  . PONV (postoperative nausea and vomiting)    pt "could not eat during hospital stay"  . Reflux esophagitis   . Rhinitis   . Shortness of breath   . Wears glasses     Current Outpatient Medications  Medication Sig Dispense Refill  . acetaminophen (TYLENOL) 325 MG tablet Take 650 mg by mouth every 6 (six) hours as needed.    Marland Kitchen albuterol (PROVENTIL HFA;VENTOLIN HFA) 108 (90 BASE) MCG/ACT inhaler Inhale 2 puffs into the lungs every 6 (six) hours as needed for wheezing or shortness of breath.    . ALPRAZolam (XANAX) 0.5 MG tablet Take 0.5 mg by mouth 3 (three) times daily as needed. For anxiety    . Biotin 5000 MCG CAPS  Take 5,000 mcg by mouth 2 (two) times daily.    . Coenzyme Q10 (CO Q 10) 100 MG CAPS Take 400 mg by mouth daily.    . CYMBALTA 60 MG capsule Take 60 mg by mouth daily.     . Fluticasone Furoate-Vilanterol (BREO ELLIPTA) 100-25 MCG/INH AEPB Inhale 1 puff into the lungs daily.     . Glucosamine 500 MG CAPS Take 500 mg by mouth 3 (three) times daily.    . meloxicam (MOBIC) 15 MG tablet Take 1 tablet by mouth daily as needed. Reported on 06/26/2015  3  . Meth-Hyo-M Bl-Na Phos-Ph Sal (URO-458) 81 MG TABS Take 1 tablet by mouth every 6 (six) hours as needed.  5  . ondansetron (ZOFRAN ODT) 4 MG disintegrating tablet Take 1 tablet (4 mg total) by mouth every 8 (eight) hours as needed for nausea or vomiting. 10 tablet 0  . oxyCODONE-acetaminophen (PERCOCET) 5-325 MG tablet Take 1 tablet by mouth every 4 (four) hours as needed. 17 tablet 0  . pseudoephedrine (SUDAFED) 30 MG tablet Take 30 mg by mouth every 4 (four) hours as needed for congestion.    . rosuvastatin (CRESTOR) 5 MG  tablet Take 5 mg by mouth 4 (four) times a week.    . Vitamin D, Ergocalciferol, (DRISDOL) 50000 units CAPS capsule Take 1 capsule (50,000 Units total) by mouth every 14 (fourteen) days. 6 capsule 1   No current facility-administered medications for this visit.     Physical Exam BP 100/64   Pulse 64   Resp 20   Ht 5' 5.5" (1.664 m)   Wt 137 lb (62.1 kg)   LMP 09/26/2013 (Exact Date)   SpO2 98% Comment: RA  BMI 22.70 kg/m  67 year old woman in no acute distress Alert and oriented x3 with no focal deficits No cervical or supra clavicular adenopathy Lungs with diminished breath sounds bilaterally no wheezing Cardiac regular rate and rhythm  Diagnostic Tests: CT CHEST WITHOUT CONTRAST  TECHNIQUE: Multidetector CT imaging of the chest was performed following the standard protocol without IV contrast.  COMPARISON:  09/23/2016  FINDINGS: Cardiovascular: The heart size appears within normal limits. No pericardial  effusion identified. Aortic atherosclerosis. Calcifications within the LAD and RCA coronary artery noted.  Mediastinum/Nodes: The trachea appears patent and is midline. Normal appearance of the esophagus. No mediastinal or hilar adenopathy.  Lungs/Pleura: Moderate changes of emphysema. Diffuse bronchial wall thickening. Right apical scarring identified. Stable to improved appearance of pleuroparenchymal scarring within the lateral left lower lobe, image 115 of series 5. Left upper lobe pulmonary nodule has a mean diameter of 1 cm. Previously 1.1 cm. Calcified granuloma is identified in the right apex.  Upper Abdomen: Previous cholecystectomy. Left renal calculus identified.  Musculoskeletal: Degenerative disc disease identified within the thoracic spine. No aggressive lytic or sclerotic bone lesions identified. There is an old healed fracture deformity involving the mid body of sternum.  IMPRESSION: 1. Stable appearance of left upper lobe pulmonary nodule with a mean diameter of 1 cm, for which 15 months of stability has been demonstrated, probably benign. 2. Previously noted area of subpleural consolidation in the periphery of the left lower lobe appears improved with residual pleuroparenchymal scarring. Likely post infectious or inflammatory. 3. Diffuse bronchial wall thickening with emphysema, as above; imaging findings suggestive of underlying COPD. 4. Aortic atherosclerosis and 3 vessel coronary artery atherosclerotic calcifications including left main coronary artery disease. 5. Aortic Atherosclerosis (ICD10-I70.0) and Emphysema (ICD10-J43.9). 6. Left kidney stone   Electronically Signed   By: Kerby Moors M.D.   On: 03/24/2017 13:21 I personally reviewed the CT images and concur with the findings noted above.  Impression: Ms. Ullmer is a 67 year old woman with a history of tobacco abuse and significant COPD.  She also has a history of lung cancer treated  with a left upper lobectomy back in 2005.  She is been followed for the past few years with multiple lung nodules.  1 of these was biopsied and showed inflammatory changes.  All cultures were negative.  The nodules have been for the most part been stable over time.  On today's scan all the nodules are either stable or improved.  There are no new areas of suspicion.  I think at this point we can wait a year before scanning again.  Tobacco abuse-in 2010  COPD-I recommended she discuss whether to change her bronchodilator with her primary who has been managing her COPD.  Plan: Return in 1 year with CT chest to follow-up multiple pulmonary nodules  [name], MD [facility] [phone]

## 2017-04-01 ENCOUNTER — Ambulatory Visit: Payer: Medicare Other | Admitting: Nurse Practitioner

## 2017-04-07 ENCOUNTER — Other Ambulatory Visit: Payer: Self-pay

## 2017-04-07 ENCOUNTER — Other Ambulatory Visit (HOSPITAL_COMMUNITY)
Admission: RE | Admit: 2017-04-07 | Discharge: 2017-04-07 | Disposition: A | Discharge status: Home / Self Care | Payer: Medicare Other | Source: Ambulatory Visit | Attending: Obstetrics & Gynecology | Admitting: Obstetrics & Gynecology

## 2017-04-07 ENCOUNTER — Encounter: Payer: Self-pay | Admitting: Obstetrics & Gynecology

## 2017-04-07 ENCOUNTER — Ambulatory Visit (INDEPENDENT_AMBULATORY_CARE_PROVIDER_SITE_OTHER): Payer: Medicare Other | Admitting: Obstetrics & Gynecology

## 2017-04-07 VITALS — BP 98/70 | HR 80 | Resp 16 | Ht 65.25 in | Wt 135.0 lb

## 2017-04-07 DIAGNOSIS — Z124 Encounter for screening for malignant neoplasm of cervix: Secondary | ICD-10-CM | POA: Insufficient documentation

## 2017-04-07 DIAGNOSIS — Z8041 Family history of malignant neoplasm of ovary: Secondary | ICD-10-CM

## 2017-04-07 DIAGNOSIS — I2584 Coronary atherosclerosis due to calcified coronary lesion: Secondary | ICD-10-CM | POA: Diagnosis not present

## 2017-04-07 DIAGNOSIS — I2510 Atherosclerotic heart disease of native coronary artery without angina pectoris: Secondary | ICD-10-CM

## 2017-04-07 DIAGNOSIS — Z01411 Encounter for gynecological examination (general) (routine) with abnormal findings: Secondary | ICD-10-CM | POA: Diagnosis not present

## 2017-04-07 MED ORDER — VITAMIN D (ERGOCALCIFEROL) 1.25 MG (50000 UNIT) PO CAPS: 50000.0000 [IU] | ORAL_CAPSULE | ORAL | 4 refills | Status: DC

## 2017-04-07 NOTE — Progress Notes (Signed)
67 y.o. [id number] MarriedCaucasianF here for annual exam.  Former patient of [facility].  H/O small cell carcinoma of the lung.  Had lobectomy in 2005.  Now has lung nodules and is being followed with serial CT scans.    Denies vaginal bleeding.     Patient's last menstrual period was 09/26/2013 (exact date).          Sexually active: No.  The current method of family planning is post menopausal status.    Exercising: No.   Smoker: No  Health Maintenance: Pap:  02/20/14 Neg. HR HPV:neg   02/16/12 Neg. HR HPV:neg  History of abnormal Pap:  no MMG:  06/10/16 BIRADS1:neg  Colonoscopy:  06/07/14 f/u 5 years  BMD:   06/10/16 Normal  TDaP:  2013 Pneumonia vaccine(s):  2017 Shingrix:   Zostavax 2013  Hep C testing: 02/23/15 neg  Screening Labs: Vit D    reports that she quit smoking about 8 years ago. She has a 80.00 pack-year smoking history. she has never used smokeless tobacco. She reports that she drinks alcohol. She reports that she does not use drugs.  Past Medical History:  Diagnosis Date  . Anxiety   . Arthritis 2018  . Bulging lumbar disc 2018  . Complication of anesthesia    "low blood pressure after retinal detachment surgery"  . COPD (chronic obstructive pulmonary disease) ([place])   . Degenerative disc disease, lumbar 2018  . Depression   . Emphysema of lung ([place])   . History of bronchitis   . History of pneumonia    "multiple times"  . Hyperlipidemia   . Hyperlipidemia   . Kidney stones 02/2016  . lung ca 05/2003  . Lung cancer ([place])    s/p LUL lobectomy  . Osteoarthritis    spine, knees, hands  . PONV (postoperative nausea and vomiting)    pt "could not eat during hospital stay"  . Reflux esophagitis   . Rhinitis   . Scoliosis 2018  . Shortness of breath   . Wears glasses     Past Surgical History:  Procedure Laterality Date  . BREAST EXCISIONAL BIOPSY    . BREAST LUMPECTOMY WITH NEEDLE LOCALIZATION Right 04/11/2013   Procedure: BREAST LUMPECTOMY WITH NEEDLE  LOCALIZATION;  Surgeon: Odis Hollingshead, MD;  Location: [facility];  Service: General;  Laterality: Right;  . BREAST SURGERY  1992   lt br bx  . CHOLECYSTECTOMY  11/11/2011   Procedure: LAPAROSCOPIC CHOLECYSTECTOMY WITH INTRAOPERATIVE CHOLANGIOGRAM;  Surgeon: Odis Hollingshead, MD;  Location: [facility];  Service: General;  Laterality: N/A;  . COLONOSCOPY    . CYSTOSCOPY  12/10   for evaluation of hematuria  . DILATION AND CURETTAGE OF UTERUS    . ESOPHAGOGASTRODUODENOSCOPY    . EYE SURGERY     catataracts-both  . KNEE SURGERY     rt knee torn cartilage  . LUNG LOBECTOMY Left 4/05  . RETINAL DETACHMENT SURGERY  2011   left; x2  . SINUS SURGERY WITH INSTATRAK    . TUBAL LIGATION    . VIDEO BRONCHOSCOPY WITH ENDOBRONCHIAL NAVIGATION N/A 01/15/2015   Procedure: VIDEO BRONCHOSCOPY WITH ENDOBRONCHIAL NAVIGATION;  Surgeon: [name], MD;  Location: [facility];  Service: Thoracic;  Laterality: N/A;  . VOCAL CORD LATERALIZATION, ENDOSCOPIC APPROACH W/ MLB     nodule removed    Current Outpatient Medications  Medication Sig Dispense Refill  . acetaminophen (TYLENOL) 325 MG tablet Take 650 mg by mouth every 6 (six) hours  as needed.    Marland Kitchen albuterol (PROVENTIL HFA;VENTOLIN HFA) 108 (90 BASE) MCG/ACT inhaler Inhale 2 puffs into the lungs every 6 (six) hours as needed for wheezing or shortness of breath.    . ALPRAZolam (XANAX) 0.5 MG tablet Take 0.5 mg by mouth 2 (two) times daily as needed. For anxiety    . Biotin 5000 MCG CAPS Take 5,000 mcg by mouth 2 (two) times daily.    . Coenzyme Q10 (CO Q 10) 100 MG CAPS Take 400 mg by mouth daily.    . CYMBALTA 60 MG capsule Take 60 mg by mouth daily.     . Fluticasone Furoate-Vilanterol (BREO ELLIPTA) 100-25 MCG/INH AEPB Inhale 1 puff into the lungs daily.     . Glucosamine 500 MG CAPS Take 500 mg by mouth 3 (three) times daily.    . meloxicam (MOBIC) 15 MG tablet Take 1 tablet by mouth daily as needed. Reported on 06/26/2015  3  .  Meth-Hyo-M Bl-Na Phos-Ph Sal (URO-458) 81 MG TABS Take 1 tablet by mouth every 6 (six) hours as needed.  5  . ondansetron (ZOFRAN ODT) 4 MG disintegrating tablet Take 1 tablet (4 mg total) by mouth every 8 (eight) hours as needed for nausea or vomiting. 10 tablet 0  . rosuvastatin (CRESTOR) 5 MG tablet Take 5 mg by mouth 4 (four) times a week.    . Vitamin D, Ergocalciferol, (DRISDOL) 50000 units CAPS capsule Take 1 capsule (50,000 Units total) by mouth every 14 (fourteen) days. 6 capsule 1   No current facility-administered medications for this visit.     Family History  Problem Relation Age of Onset  . Cancer Mother 22       ovarian cancer  . Hypertension Mother   . Diabetes Mother   . Alcohol abuse Father     ROS:  Pertinent items are noted in HPI.  Otherwise, a comprehensive ROS was negative.  Exam:   BP 98/70 (BP Location: Right Arm, Patient Position: Sitting, Cuff Size: Normal)   Pulse 80   Resp 16   Ht 5' 5.25" (1.657 m)   Wt 135 lb (61.2 kg)   LMP 09/26/2013 (Exact Date)   BMI 22.29 kg/m   Weight change: -2#  Height: 5' 5.25" (165.7 cm)  Ht Readings from Last 3 Encounters:  04/07/17 5' 5.25" (1.657 m)  03/24/17 5' 5.5" (1.664 m)  10/29/16 5' 5.5" (1.664 m)    General appearance: alert, cooperative and appears stated age Head: Normocephalic, without obvious abnormality, atraumatic Neck: no adenopathy, supple, symmetrical, trachea midline and thyroid normal to inspection and palpation Lungs: clear to auscultation bilaterally Breasts: normal appearance, no masses or tenderness Heart: regular rate and rhythm Abdomen: soft, non-tender; bowel sounds normal; no masses,  no organomegaly Extremities: extremities normal, atraumatic, no cyanosis or edema Skin: Skin color, texture, turgor normal. No rashes or lesions Lymph nodes: Cervical, supraclavicular, and axillary nodes normal. No abnormal inguinal nodes palpated Neurologic: Grossly normal   Pelvic: External  genitalia:  no lesions              Urethra:  normal appearing urethra with no masses, tenderness or lesions              Bartholins and Skenes: normal                 Vagina: normal appearing vagina with normal color and discharge, no lesions              Cervix: no lesions  Pap taken: Yes.   Bimanual Exam:  Uterus:  normal size, contour, position, consistency, mobility, non-tender              Adnexa: normal adnexa and no mass, fullness, tenderness               Rectovaginal: Confirms               Anus:  normal sphincter tone, no lesions  Chaperone was present for exam.  A:  Well Woman with normal exam PMP, no HRT Family hx of ovarian cancer in her mother Coronary artery calcifications Renal stones with left renal stone noted on recent CT Vit D deficiency  P:   Mammogram guidelines reviewed.  Doing 3D MMG. pap smear obtained today Will return for vaginal ultrasound.  Will plan to do this yearly. Will refer to cardiology regarding coronary artery calcifications seen on recent CT Will not repeat Vit D this year.  Plan to do this next year.   RF for Vit D 50K every 2 weeks.  #6/4RF. Return annually or prn

## 2017-04-08 LAB — CYTOLOGY - PAP
ADEQUACY: ABSENT
DIAGNOSIS: NEGATIVE

## 2017-04-09 ENCOUNTER — Ambulatory Visit: Payer: Medicare Other | Admitting: Obstetrics & Gynecology

## 2017-04-09 ENCOUNTER — Ambulatory Visit (INDEPENDENT_AMBULATORY_CARE_PROVIDER_SITE_OTHER): Payer: Medicare Other

## 2017-04-09 VITALS — BP 112/80 | HR 64 | Resp 16

## 2017-04-09 DIAGNOSIS — Z8041 Family history of malignant neoplasm of ovary: Secondary | ICD-10-CM

## 2017-04-09 NOTE — Progress Notes (Signed)
67 y.o. [id number] Married Caucasian female here for pelvic ultrasound due to family hx of ovarian cancer and concerns regarding intermittent constipation and bloating.  Knows these are non-specific GI symptoms but desirous of additional testing.  Denies vaginal bleeding.  Patient's last menstrual period was 09/26/2013 (exact date).  Contraception: BTL and PMP  Findings:  UTERUS: 5.9 x 4.1 x 2.7cm with single cystic space in myometrium 8 x 33mm, nabothian cyst measuring 17 x 49mm noted as well EMS: 3.57mm ADNEXA: Left ovary: 2.4 x 1.3 x 1.1cm       Right ovary: 1.4 x 1.3 x 1.1cm.  Peripheral calcifications noted. CUL DE SAC: no free fluid  Discussion:  Findings reviewed.  D/w pt possible causes of cystic spaces.  Nabothian cyst discussed as well.  Pt desirous of yearly ultrasounds at this point.  Will review with follow-up yearly AEX.  Assessment:  Family hx of ovarian cancer GI changes  Plan:  Pt reassured.  Will plan to repeat next year.  ~15 minutes spent with patient >50% of time was in face to face discussion of above.

## 2017-04-12 ENCOUNTER — Encounter: Payer: Self-pay | Admitting: Obstetrics & Gynecology

## 2017-05-05 ENCOUNTER — Other Ambulatory Visit: Payer: Self-pay | Admitting: Obstetrics & Gynecology

## 2017-05-05 DIAGNOSIS — Z1231 Encounter for screening mammogram for malignant neoplasm of breast: Secondary | ICD-10-CM

## 2017-06-07 ENCOUNTER — Encounter (HOSPITAL_COMMUNITY): Payer: Self-pay

## 2017-06-07 ENCOUNTER — Emergency Department (HOSPITAL_COMMUNITY)
Admission: EM | Admit: 2017-06-07 | Discharge: 2017-06-08 | Disposition: A | Discharge status: Home / Self Care | Payer: Medicare Other | Attending: Emergency Medicine | Admitting: Emergency Medicine

## 2017-06-07 ENCOUNTER — Other Ambulatory Visit: Payer: Self-pay

## 2017-06-07 ENCOUNTER — Emergency Department (HOSPITAL_COMMUNITY): Payer: Medicare Other

## 2017-06-07 DIAGNOSIS — Z87891 Personal history of nicotine dependence: Secondary | ICD-10-CM | POA: Diagnosis not present

## 2017-06-07 DIAGNOSIS — J449 Chronic obstructive pulmonary disease, unspecified: Secondary | ICD-10-CM | POA: Diagnosis not present

## 2017-06-07 DIAGNOSIS — Z79899 Other long term (current) drug therapy: Secondary | ICD-10-CM | POA: Diagnosis not present

## 2017-06-07 DIAGNOSIS — R1032 Left lower quadrant pain: Secondary | ICD-10-CM

## 2017-06-07 LAB — COMPREHENSIVE METABOLIC PANEL
ALBUMIN: 4 g/dL (ref 3.5–5.0)
ALK PHOS: 76 U/L (ref 38–126)
ALT: 19 U/L (ref 14–54)
AST: 23 U/L (ref 15–41)
Anion gap: 8 (ref 5–15)
BILIRUBIN TOTAL: 0.8 mg/dL (ref 0.3–1.2)
BUN: 19 mg/dL (ref 6–20)
CALCIUM: 8.9 mg/dL (ref 8.9–10.3)
CO2: 23 mmol/L (ref 22–32)
CREATININE: 0.7 mg/dL (ref 0.44–1.00)
Chloride: 109 mmol/L (ref 101–111)
GFR calc Af Amer: >60 mL/min (ref >60)
GLUCOSE: 110 mg/dL — AB (ref 65–99)
POTASSIUM: 3.7 mmol/L (ref 3.5–5.1)
Sodium: 140 mmol/L (ref 135–145)
Total Protein: 6.7 g/dL (ref 6.5–8.1)

## 2017-06-07 LAB — CBC WITH DIFFERENTIAL/PLATELET
BASOS ABS: 0 10*3/uL (ref 0.0–0.1)
BASOS PCT: 0 %
EOS PCT: 2 %
Eosinophils Absolute: 0.1 10*3/uL (ref 0.0–0.7)
HCT: 43.3 % (ref 36.0–46.0)
Hemoglobin: 14.4 g/dL (ref 12.0–15.0)
Lymphocytes Relative: 26 %
Lymphs Abs: 1.7 10*3/uL (ref 0.7–4.0)
MCH: 29.9 pg (ref 26.0–34.0)
MCHC: 33.3 g/dL (ref 30.0–36.0)
MCV: 89.8 fL (ref 78.0–100.0)
MONO ABS: 0.7 10*3/uL (ref 0.1–1.0)
MONOS PCT: 10 %
Neutro Abs: 4 10*3/uL (ref 1.7–7.7)
Neutrophils Relative %: 62 %
PLATELETS: 283 10*3/uL (ref 150–400)
RBC: 4.82 MIL/uL (ref 3.87–5.11)
RDW: 13.7 % (ref 11.5–15.5)
WBC: 6.4 10*3/uL (ref 4.0–10.5)

## 2017-06-07 LAB — URINALYSIS, ROUTINE W REFLEX MICROSCOPIC
BILIRUBIN URINE: NEGATIVE
Glucose, UA: NEGATIVE mg/dL
Hgb urine dipstick: NEGATIVE
KETONES UR: NEGATIVE mg/dL
LEUKOCYTES UA: NEGATIVE
NITRITE: NEGATIVE
Protein, ur: NEGATIVE mg/dL
Specific Gravity, Urine: 1.024 (ref 1.005–1.030)
pH: 5 (ref 5.0–8.0)

## 2017-06-07 LAB — LIPASE, BLOOD: LIPASE: 38 U/L (ref 11–51)

## 2017-06-07 MED ORDER — IOPAMIDOL (ISOVUE-300) INJECTION 61%
INTRAVENOUS | Status: AC
  Administered 2017-06-07: 100 mL via INTRAVENOUS
  Filled 2017-06-07: qty 100

## 2017-06-07 NOTE — ED Notes (Signed)
Patient transported to CT 

## 2017-06-07 NOTE — ED Triage Notes (Addendum)
Pt has known hx of kidney stones, pt began experiencing steady increase in pain of Left lower back and emesis, which she states she wants to get ahead of the game, as she has experienced significant pain with kidney stones in the past. Denies chest pain or shortness of breath.

## 2017-06-07 NOTE — ED Provider Notes (Signed)
[facility] DEPT Provider Note   CSN: [id number] Arrival date & time: 06/07/17  2006     History   Chief Complaint Chief Complaint  Patient presents with  . Nephrolithiasis    HPI Tammy Eaton is a 67 y.o. female h/o previous tobacco use, COPD, lung cancer in 2005 s/p lobectomy, lung nodules, chronic back pain, kidney stones here for evaluation of left lower quadrant abdominal pain onset earlier today.  Associated with nausea and vomiting.  Vomited once on Friday and has been nauseated since.  Feels similar to previous kidney stone.  She has long history of constipation that she attributes to Cymbalta however last bowel movement on Friday before symptom onset.  Has taken Phenergan and ibuprofen with moderate relief of symptoms.  She is concerned about another kidney stone, states she is going on vacation this week and will be near hospital.  No fevers, chills, dysuria, hematuria, melena, hematochezia, changes in bowel movements, abnormal vaginal bleeding.  Has had colonoscopies with polyps but otherwise normal.  No history of diverticulitis.  History of cholecystectomy.  Family history of ovarian cancer, follow-up recently with GYN for this with normal work-up and exam. GYN etiology lower on differential. Will obtain labs and likely CT.  HPI  Past Medical History:  Diagnosis Date  . Anxiety   . Arthritis 2018  . Bulging lumbar disc 2018  . Complication of anesthesia    "low blood pressure after retinal detachment surgery"  . COPD (chronic obstructive pulmonary disease) ([place])   . Degenerative disc disease, lumbar 2018  . Depression   . Emphysema of lung ([place])   . History of bronchitis   . History of pneumonia    "multiple times"  . Hyperlipidemia   . Hyperlipidemia   . Kidney stones 02/2016  . lung ca 05/2003  . Lung cancer ([place])    s/p LUL lobectomy  . Osteoarthritis    spine, knees, hands  . PONV (postoperative nausea and vomiting)    pt  "could not eat during hospital stay"  . Reflux esophagitis   . Rhinitis   . Scoliosis 2018  . Shortness of breath   . Wears glasses     Patient Active Problem List   Diagnosis Date Noted  . COPD GOLD II  12/25/2014  . Nodule of right lung 12/11/2014  . 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 Primary cancer of left upper lobe of lung ([place]) 11/27/2011  . Symptomatic cholelithiasis 09/22/2011    Past Surgical History:  Procedure Laterality Date  . BREAST EXCISIONAL BIOPSY    . BREAST LUMPECTOMY WITH NEEDLE LOCALIZATION Right 04/11/2013   Procedure: BREAST LUMPECTOMY WITH NEEDLE LOCALIZATION;  Surgeon: Odis Hollingshead, MD;  Location: [facility];  Service: General;  Laterality: Right;  . BREAST SURGERY  1992   lt br bx  . CHOLECYSTECTOMY  11/11/2011   Procedure: LAPAROSCOPIC CHOLECYSTECTOMY WITH INTRAOPERATIVE CHOLANGIOGRAM;  Surgeon: Odis Hollingshead, MD;  Location: [facility];  Service: General;  Laterality: N/A;  . COLONOSCOPY    . CYSTOSCOPY  12/10   for evaluation of hematuria  . DILATION AND CURETTAGE OF UTERUS    . ESOPHAGOGASTRODUODENOSCOPY    . EYE SURGERY     catataracts-both  . KNEE SURGERY     rt knee torn cartilage  . LUNG LOBECTOMY Left 4/05  . RETINAL DETACHMENT SURGERY  2011   left; x2  . SINUS SURGERY WITH INSTATRAK    . TUBAL LIGATION    .  VIDEO BRONCHOSCOPY WITH ENDOBRONCHIAL NAVIGATION N/A 01/15/2015   Procedure: VIDEO BRONCHOSCOPY WITH ENDOBRONCHIAL NAVIGATION;  Surgeon: [name], MD;  Location: [facility];  Service: Thoracic;  Laterality: N/A;  . VOCAL CORD LATERALIZATION, ENDOSCOPIC APPROACH W/ MLB     nodule removed     OB History    Gravida  2   Para  2   Term  2   Preterm  0   AB  0   Living  2     SAB  0   TAB  0   Ectopic  0   Multiple  0   Live Births  2            Home Medications    Prior to Admission medications   Medication Sig Start Date End Date Taking? Authorizing Provider  acetaminophen  (TYLENOL) 325 MG tablet Take 650 mg by mouth every 6 (six) hours as needed.    [provider]  albuterol (PROVENTIL HFA;VENTOLIN HFA) 108 (90 BASE) MCG/ACT inhaler Inhale 2 puffs into the lungs every 6 (six) hours as needed for wheezing or shortness of breath.    [provider]  ALPRAZolam Duanne Moron) 0.5 MG tablet Take 0.5 mg by mouth 2 (two) times daily as needed. For anxiety 09/15/11   [provider]  Biotin 5000 MCG CAPS Take 5,000 mcg by mouth 2 (two) times daily.    [provider]  Coenzyme Q10 (CO Q 10) 100 MG CAPS Take 400 mg by mouth daily.    [provider]  CYMBALTA 60 MG capsule Take 60 mg by mouth daily.  09/11/11   [provider]  Fluticasone Furoate-Vilanterol (BREO ELLIPTA) 100-25 MCG/INH AEPB Inhale 1 puff into the lungs daily.     [provider]  Glucosamine 500 MG CAPS Take 500 mg by mouth 3 (three) times daily.    [provider]  meloxicam (MOBIC) 15 MG tablet Take 1 tablet by mouth daily as needed. Reported on 06/26/2015 01/24/15   [provider]  Meth-Hyo-M Bl-Na Phos-Ph Sal (URO-458) 81 MG TABS Take 1 tablet by mouth every 6 (six) hours as needed. 03/04/16   [provider]  ondansetron (ZOFRAN ODT) 4 MG disintegrating tablet Take 1 tablet (4 mg total) by mouth every 8 (eight) hours as needed for nausea or vomiting. 02/22/16   Pisciotta, Elmyra Ricks, PA-C  rosuvastatin (CRESTOR) 5 MG tablet Take 5 mg by mouth 4 (four) times a week. 01/30/16   [provider]  Vitamin D, Ergocalciferol, (DRISDOL) 50000 units CAPS capsule Take 1 capsule (50,000 Units total) by mouth every 14 (fourteen) days. 04/07/17   Megan Salon, MD    Family History Family History  Problem Relation Age of Onset  . Cancer Mother 59       ovarian cancer  . Hypertension Mother   . Diabetes Mother   . Alcohol abuse Father     Social History Social History   Tobacco Use  . Smoking status: Former Smoker     Packs/day: 2.00    Years: 40.00    Pack years: 80.00    Last attempt to quit: 09/21/2008    Years since quitting: 8.7  . Smokeless tobacco: Never Used  Substance Use Topics  . Alcohol use: Yes    Comment: SOCIALLY  . Drug use: No     Allergies   Adhesive [tape]; Amoxicillin; and Septra [sulfamethoxazole-trimethoprim]   Review of Systems Review of Systems  Gastrointestinal: Positive for abdominal pain,  nausea and vomiting.  All other systems reviewed and are negative.    Physical Exam Updated Vital Signs BP 98/67 (BP Location: Right Arm)   Pulse 71   Temp 98.4 F (36.9 C) (Oral)   Resp 16   Ht 5' 6.5" (1.689 m)   Wt 61.2 kg (135 lb)   LMP 09/26/2013 (Exact Date)   SpO2 100%   BMI 21.46 kg/m   Physical Exam  Constitutional: She is oriented to person, place, and time. She appears well-developed and well-nourished. No distress.  Non toxic  HENT:  Head: Normocephalic and atraumatic.  Nose: Nose normal.  Mouth/Throat: No oropharyngeal exudate.  Moist mucous membranes   Eyes: Pupils are equal, round, and reactive to light. Conjunctivae and EOM are normal.  Neck: Normal range of motion.  Cardiovascular: Normal rate, regular rhythm and intact distal pulses.  No murmur heard. 2+ DP and radial pulses bilaterally. No LE edema.   Pulmonary/Chest: Effort normal and breath sounds normal. No respiratory distress. She has no wheezes. She has no rales.  Abdominal: Soft. Bowel sounds are normal. There is tenderness.  Mild left lower quadrant tenderness, no guarding, rigidity or distention.  No CVA tenderness.  No suprapubic tenderness.  Musculoskeletal: Normal range of motion. She exhibits no deformity.  Neurological: She is alert and oriented to person, place, and time.  Skin: Skin is warm and dry. Capillary refill takes less than 2 seconds.  Psychiatric: She has a normal mood and affect. Her behavior is normal. Judgment and thought content normal.  Nursing note and vitals  reviewed.    ED Treatments / Results  Labs (all labs ordered are listed, but only abnormal results are displayed) Labs Reviewed  URINALYSIS, ROUTINE W REFLEX MICROSCOPIC - Abnormal; Notable for the following components:      Result Value   APPearance HAZY (*)    All other components within normal limits  COMPREHENSIVE METABOLIC PANEL - Abnormal; Notable for the following components:   Glucose, Bld 110 (*)    All other components within normal limits  URINE CULTURE  LIPASE, BLOOD  CBC WITH DIFFERENTIAL/PLATELET    EKG None  Radiology Ct Abdomen Pelvis W Contrast  Result Date: 06/08/2017 CLINICAL DATA:  Left lower back pain and emesis. EXAM: CT ABDOMEN AND PELVIS WITH CONTRAST TECHNIQUE: Multidetector CT imaging of the abdomen and pelvis was performed using the standard protocol following bolus administration of intravenous contrast. CONTRAST:  100 cc ISOVUE-300 IOPAMIDOL (ISOVUE-300) INJECTION 61% COMPARISON:  02/22/2016 FINDINGS: Lower chest: Chronic pleural-based density along the lateral periphery of the left lung base likely representing small focus of atelectasis and/or scarring. This appears slightly smaller than on prior measuring 11 mm versus 15 mm previously. Redemonstration of centrilobular emphysema. Normal size heart without pericardial effusion. Medial right lower lobe subpleural scarring or atelectasis is redemonstrated. Hepatobiliary: Status post cholecystectomy. No enhancing mass lesions of the liver. No biliary dilatation. Fatty infiltration along the falciform. Pancreas: No pancreatic ductal dilatation, mass or inflammation. Spleen: 5 mm stable hypodensity in the anterior spleen unchanged likely representing small cyst. Adrenals/Urinary Tract: Normal bilateral adrenal glands. No obstructive uropathy. There is an upper pole renal calculus measuring 6 mm. Stable left lower pole renal cyst measuring 6 mm. No enhancing mass lesions of either kidney. Mild cortical scarring noted  of the right kidney. Symmetric bilateral pyelograms without obstruction. The urinary bladder is physiologically distended without focal mural thickening or calculus. Stomach/Bowel: Stomach is within normal limits. Appendix appears normal. No evidence of bowel wall thickening,  distention, or inflammatory changes. Vascular/Lymphatic: Aortic atherosclerosis. No enlarged abdominal or pelvic lymph nodes. Reproductive: Uterus and bilateral adnexa are unremarkable. Other: Small fat containing umbilical hernia. No abdominopelvic ascites. Musculoskeletal: Marked degenerative disc disease L1 through L3 and at L4-5 with discogenic sclerosis of the endplates at N0-5. Mild grade 1 degenerative retrolisthesis of L1 on L2 and L2 on L3. No pars defects. IMPRESSION: 1. Nonobstructing 6 mm left upper pole renal calculus. No hydroureteronephrosis. Stable 6 mm left lower pole renal cyst. 2. Chronic scarring along the periphery of the left lung base with smaller pleural based density since prior. Centrilobular emphysema is redemonstrated bilaterally. 3. Stable 5 mm hypodensity in the anterior spleen compatible with a cyst. 4. Lumbar spondylosis. Electronically Signed   By: Ashley Royalty M.D.   On: 06/08/2017 00:02    Procedures Procedures (including critical care time)  Medications Ordered in ED Medications  iopamidol (ISOVUE-300) 61 % injection (100 mLs Intravenous Contrast Given 06/07/17 2301)     Initial Impression / Assessment and Plan / ED Course  I have reviewed the triage vital signs and the nursing notes.  Pertinent labs & imaging results that were available during my care of the patient were reviewed by me and considered in my medical decision making (see chart for details).  Clinical Course as of Jun 09 23  Mon Jun 08, 2017  0004 IMPRESSION: 1. Nonobstructing 6 mm left upper pole renal calculus. No hydroureteronephrosis. Stable 6 mm left lower pole renal cyst. 2. Chronic scarring along the periphery of the  left lung base with smaller pleural based density since prior. Centrilobular emphysema is redemonstrated bilaterally. 3. Stable 5 mm hypodensity in the anterior spleen compatible with a cyst. 4. Lumbar spondylosis.    CT ABDOMEN PELVIS W CONTRAST [CG]    Clinical Course User Index [CG] Kinnie Feil, PA-C   Ddx includes diverticulitis, UTI/pyelo, kidney stone.  Abdomen is non surgical SBO, perforation lower on differential. She has no vaginal complaints and had recent GYN exam and normal, low suspicion for ovarian torsion, PID.   Labs, urinalysis, CT reviewed.  Patient has a nonobstructing small calculus in the left upper pole of kidney.  She has had no episodes of emesis.  Has not required analgesia.  At this time, patient appropriate for discharge with symptom control.  Patient to follow-up with PCP in 48 hours if symptoms persist.  Discussed return precautions.  Patient and husband agreeable to treatment and discharge plan.   Final Clinical Impressions(s) / ED Diagnoses   Final diagnoses:  LLQ abdominal pain    ED Discharge Orders    None       Arlean Hopping 06/08/17 Amil Amen, MD 06/11/17 [phone]

## 2017-06-08 NOTE — Discharge Instructions (Addendum)
Labs and CT scan are reassuring without new changes.  You have a 6 mm left kidney stone and a left kidney cyst, unchanged.  This have not moved and are not obstructing.  Unclear if this is causing pain. CT otherwise looks okay.    Take phenergan and tylenol/ibuprofen for pain. Follow up with primary care doctor in 48-72 hours if symptoms do not improve.   Return for worsening symptoms, fevers, chills, urinary symptoms, changes in bowel movements  CT abdomen and pelvis with contrast 06/08/17 IMPRESSION:  1. Nonobstructing 6 mm left upper pole renal calculus. No  hydroureteronephrosis. Stable 6 mm left lower pole renal cyst.  2. Chronic scarring along the periphery of the left lung base with  smaller pleural based density since prior. Centrilobular emphysema  is redemonstrated bilaterally.  3. Stable 5 mm hypodensity in the anterior spleen compatible with a  cyst.  4. Lumbar spondylosis.

## 2017-06-10 LAB — URINE CULTURE: Culture: >=100000 — AB

## 2017-06-11 ENCOUNTER — Telehealth: Payer: Self-pay | Admitting: *Deleted

## 2017-06-11 NOTE — Telephone Encounter (Signed)
Post ED Visit - Positive Culture Follow-up: Unsuccessful Patient Follow-up  Culture assessed and recommendations reviewed by:  []  Elenor Quinones, Pharm.D. []  Heide Guile, Pharm.D., BCPS AQ-ID []  Parks Neptune, Pharm.D., BCPS []  [name] Rossetti, Pharm.D., BCPS []  [place], [place].D., BCPS, AAHIVP []  Legrand Como, Pharm.D., BCPS, AAHIVP []  Wynell Balloon, PharmD []  Vincenza Hews, PharmD, BCPS  Positive urine culture, reviewed by Krystal Clark, PA-C  []  Patient discharged without antimicrobial prescription and treatment is now indicated.  If remains symptomatic start Amoxicillin 500mg  PO BID x 5 days []  Organism is resistant to prescribed ED discharge antimicrobial []  Patient with positive blood cultures   Unable to contact patient after 3 attempts, letter will be sent to address on file  Ardeen Fillers 06/11/2017, 9:44 AM

## 2017-06-11 NOTE — Progress Notes (Signed)
ED Antimicrobial Stewardship Positive Culture Follow Up   Tammy Eaton is an 68 y.o. female who presented to [facility] on 06/07/2017 with a chief complaint of  Chief Complaint  Patient presents with  . Nephrolithiasis    Recent Results (from the past 720 hour(s))  Urine culture     Status: Abnormal   Collection Time: 06/07/17 11:25 PM  Result Value Ref Range Status   Specimen Description   Final    URINE, RANDOM Performed at [facility] [address] [place], [place] [address]    Special Requests   Final    NONE Performed at [facility], [place] [address] [place], [place] [address]    Culture >=100,000 COLONIES/mL ENTEROCOCCUS FAECALIS (A)  Final   Report Status 06/10/2017 FINAL  Final   Organism ID, Bacteria ENTEROCOCCUS FAECALIS (A)  Final      Susceptibility   Enterococcus faecalis - MIC*    AMPICILLIN <=2 SENSITIVE Sensitive     LEVOFLOXACIN 1 SENSITIVE Sensitive     NITROFURANTOIN <=16 SENSITIVE Sensitive     VANCOMYCIN 2 SENSITIVE Sensitive     * >=100,000 COLONIES/mL ENTEROCOCCUS FAECALIS    Will call patient and see if her symptoms are improving from stone. If she reports any dysuria, urgency, hematuria will plan to treat with amoxicillin 500mg  PO BID (thrush is not allergy to medication) If not symptoms, will not treat.  ED Provider: Kennith Maes PA-C   Reginia Naas 06/11/2017, 9:13 AM Infectious Diseases Pharmacist Phone# [phone]

## 2017-06-15 ENCOUNTER — Encounter: Payer: Self-pay | Admitting: Internal Medicine

## 2017-06-20 ENCOUNTER — Telehealth: Payer: Self-pay

## 2017-06-20 NOTE — Telephone Encounter (Signed)
Returned call for symptom check. No further problems. No abx needed for [facility] ED 06/08/17 per Ebbie Latus PAC

## 2017-06-22 ENCOUNTER — Ambulatory Visit: Payer: Medicare Other

## 2017-06-24 ENCOUNTER — Ambulatory Visit
Admission: RE | Admit: 2017-06-24 | Discharge: 2017-06-24 | Disposition: A | Discharge status: Home / Self Care | Payer: Medicare Other | Source: Ambulatory Visit | Attending: Obstetrics & Gynecology | Admitting: Obstetrics & Gynecology

## 2017-06-24 DIAGNOSIS — Z1231 Encounter for screening mammogram for malignant neoplasm of breast: Secondary | ICD-10-CM

## 2017-07-06 ENCOUNTER — Encounter: Payer: Self-pay | Admitting: Internal Medicine

## 2017-07-06 ENCOUNTER — Ambulatory Visit: Payer: Medicare Other | Admitting: Internal Medicine

## 2017-07-06 VITALS — BP 100/72 | HR 77 | Ht 66.5 in | Wt 132.8 lb

## 2017-07-06 DIAGNOSIS — E782 Mixed hyperlipidemia: Secondary | ICD-10-CM | POA: Diagnosis not present

## 2017-07-06 DIAGNOSIS — I2510 Atherosclerotic heart disease of native coronary artery without angina pectoris: Secondary | ICD-10-CM

## 2017-07-06 DIAGNOSIS — J439 Emphysema, unspecified: Secondary | ICD-10-CM | POA: Diagnosis not present

## 2017-07-06 NOTE — Patient Instructions (Signed)
Your physician recommends that you continue on your current medications as directed. Please refer to the Current Medication list given to you today.  Your physician wants you to follow-up in: February, 2020 with Dr. Harrington Challenger.  You will receive a reminder letter in the mail two months in advance. If you don't receive a letter, please call our office to schedule the follow-up appointment.

## 2017-07-06 NOTE — Progress Notes (Signed)
Cardiology Office Note   Date:  07/06/2017   ID:  Tammy Eaton, DOB [birth year], MRN [id number]  PCP:  Darcus Austin, MD  Cardiologist:   Dorris Carnes, MD    Pt referred by Dr Lyn Records for CAD on CT scan    History of Present Illness: Tammy Eaton is a 67 y.o. 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   Did OK  No problems    Deneis dizziness   No PND   Occasional LE ankle edema        Current Meds  Medication Sig  . acetaminophen (TYLENOL) 325 MG tablet Take 650 mg by mouth every 6 (six) hours as needed.  Marland Kitchen albuterol (PROVENTIL HFA;VENTOLIN HFA) 108 (90 BASE) MCG/ACT inhaler Inhale 2 puffs into the lungs every 6 (six) hours as needed for wheezing or shortness of breath.  . ALPRAZolam (XANAX) 0.5 MG tablet Take 0.5 mg by mouth 2 (two) times daily as needed. For anxiety  . Biotin 5000 MCG CAPS Take 5,000 mcg by mouth 2 (two) times daily.  . Coenzyme Q10 (CO Q 10) 100 MG CAPS Take 400 mg by mouth daily.  . CYMBALTA 60 MG capsule Take 60 mg by mouth daily.   . Fluticasone Furoate-Vilanterol (BREO ELLIPTA) 100-25 MCG/INH AEPB Inhale 1 puff into the lungs daily.   . Glucosamine 500 MG CAPS Take 500 mg by mouth 3 (three) times daily.  . Meth-Hyo-M Bl-Na Phos-Ph Sal (URO-458) 81 MG TABS Take 1 tablet by mouth every 6 (six) hours as needed.  . ondansetron (ZOFRAN ODT) 4 MG disintegrating tablet Take 1 tablet (4 mg total) by mouth every 8 (eight) hours as needed for nausea or vomiting.  . rosuvastatin (CRESTOR) 5 MG tablet Take 5 mg by mouth 4 (four) times a week.  . Vitamin D, Ergocalciferol, (DRISDOL) 50000 units CAPS capsule Take 1 capsule (50,000 Units total) by mouth every 14 (fourteen) days.     Allergies:   Adhesive [tape]; Septra  [sulfamethoxazole-trimethoprim]; and Amoxicillin   Past Medical History:  Diagnosis Date  . Anxiety   . Arthritis 2018  . Bulging lumbar disc 2018  . Complication of anesthesia    "low blood pressure after retinal detachment surgery"  . COPD (chronic obstructive pulmonary disease) ([place])   . Degenerative disc disease, lumbar 2018  . Depression   . Emphysema of lung ([place])   . History of bronchitis   . History of pneumonia    "multiple times"  . Hyperlipidemia   . Hyperlipidemia   . Kidney stones 02/2016  . lung ca 05/2003  . Lung cancer ([place])    s/p LUL lobectomy  . Osteoarthritis    spine, knees, hands  . PONV (postoperative nausea and vomiting)    pt "could not eat during hospital stay"  . Reflux esophagitis   . Rhinitis   . Scoliosis 2018  . Shortness of breath   . Wears glasses     Past Surgical History:  Procedure Laterality Date  . BREAST EXCISIONAL BIOPSY    . BREAST LUMPECTOMY WITH NEEDLE LOCALIZATION Right 04/11/2013   Procedure: BREAST LUMPECTOMY WITH NEEDLE LOCALIZATION;  Surgeon: Odis Hollingshead, MD;  Location: [facility];  Service: General;  Laterality: Right;  . BREAST SURGERY  1992   lt br bx  . CHOLECYSTECTOMY  11/11/2011   Procedure: LAPAROSCOPIC CHOLECYSTECTOMY WITH INTRAOPERATIVE CHOLANGIOGRAM;  Surgeon: Odis Hollingshead, MD;  Location: [facility];  Service: General;  Laterality: N/A;  . COLONOSCOPY    . CYSTOSCOPY  12/10   for evaluation of hematuria  . DILATION AND CURETTAGE OF UTERUS    . ESOPHAGOGASTRODUODENOSCOPY    . EYE SURGERY     catataracts-both  . KNEE SURGERY     rt knee torn cartilage  . LUNG LOBECTOMY Left 4/05  . RETINAL DETACHMENT SURGERY  2011   left; x2  . SINUS SURGERY WITH INSTATRAK    . TUBAL LIGATION    . VIDEO BRONCHOSCOPY WITH ENDOBRONCHIAL NAVIGATION N/A 01/15/2015   Procedure: VIDEO BRONCHOSCOPY WITH ENDOBRONCHIAL NAVIGATION;  Surgeon: [name], MD;  Location: [facility];  Service: Thoracic;   Laterality: N/A;  . VOCAL CORD LATERALIZATION, ENDOSCOPIC APPROACH W/ MLB     nodule removed     Social History:  The patient  reports that she quit smoking about 8 years ago. She has a 80.00 pack-year smoking history. She has never used smokeless tobacco. She reports that she drinks alcohol. She reports that she does not use drugs.   Family History:  The patient's family history includes Alcohol abuse in her father; Cancer (age of onset: 55) in her mother; Diabetes in her mother; Hypertension in her mother.    ROS:  Please see the history of present illness. All other systems are reviewed and  Negative to the above problem except as noted.    PHYSICAL EXAM: VS:  BP 100/72   Pulse 77   Ht 5' 6.5" (1.689 m)   Wt 60.2 kg (132 lb 12.8 oz)   LMP 09/26/2013 (Exact Date)   BMI 21.11 kg/m   GEN:  Thin 67 yo in no acute distress  HEENT: normal  Neck: no JVD, carotid bruits, or masses Cardiac: RRR; no murmurs, rubs, or gallops,no edema  Respiratory:  clear to auscultation bilaterally, normal work of breathing GI: soft, nontender, nondistended, + BS  No hepatomegaly  MS: no deformity Moving all extremities   Skin: warm and dry, no rash Neuro:  Strength and sensation are intact Psych: euthymic mood, full affect   EKG:  EKG is ordered today.  SR 77  Occaisonal PVC    Lipid Panel No results found for: CHOL, TRIG, HDL, CHOLHDL, VLDL, LDLCALC, LDLDIRECT    Wt Readings from Last 3 Encounters:  07/06/17 60.2 kg (132 lb 12.8 oz)  06/07/17 61.2 kg (135 lb)  04/07/17 61.2 kg (135 lb)      ASSESSMENT AND PLAN:  1  CAD   Pt with CAD  found on CT scan   I am not convinced of active angina or flow limiting leasions   I would follow clinically   She has not tolerated asa in past and I would not start at this time   Stay active     2  HL    LDL good at 66 on current regimen  COntinue    3  COPD  SIgnificant    F/U in Feb 2020 to see how she is doing.     Current medicines are  reviewed at length with the patient today.  The patient does not have concerns regarding medicines.  Signed, Dorris Carnes, MD  07/06/2017 10:27 AM    [facility]  [address], [place], [place]  [address] Phone: [phone]; Fax: [phone]

## 2017-08-16 ENCOUNTER — Other Ambulatory Visit: Payer: Self-pay | Admitting: Obstetrics & Gynecology

## 2017-08-17 NOTE — Telephone Encounter (Signed)
Medication refill request: Vitamin D 50,000 unitys Last AEX:  04/07/17 Next AEX: 05/25/18 Last MMG (if hormonal medication request): 06/24/17 Bi-rads Category 2 benign  Refill authorized: Please refill if appropriate.

## 2017-10-27 NOTE — Progress Notes (Signed)
Tammy Eaton    HEMATOLOGY/ONCOLOGY CLINIC FOLLOWUP NOTE  Date of Service: 10/28/17.   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  INTERVAL HISTORY:  Tammy Eaton is here for scheduled follow on previous history of lung cancer and specifically  f/u of her right upper lobe lung nodule. The patient's last visit with [place] was on 10/29/16. The pt reports that she is doing well overall and has enjoyed spending time with her grandchildren and travelling.   The pt reports that aside from her lower back pain, she has not had any concerns. She notes that she has been pursing injections with some relief. She notes that she has had good energy levels and denies any current concerns for infections. The pt also notes that she has been staying very active.   The pt also notes that her O2 sats have not decreased beneath 95%, and have recently been 99-100%. She denies any difficulty breathing, SOB, and CP. She denies developing any constitutional symptoms.    Of note since the patient's last visit, pt has had CT Chest completed on 03/24/17 with results revealing Stable appearance of left upper lobe pulmonary nodule with a mean diameter of 1 cm, for which 15 months of stability has been demonstrated, probably benign. 2. Previously noted area of subpleural consolidation in the periphery of the left lower lobe appears improved with residual pleuroparenchymal scarring. Likely post infectious or inflammatory. 3. Diffuse bronchial wall thickening with emphysema, as above; imaging findings suggestive of underlying COPD. 4. Aortic  atherosclerosis and 3 vessel coronary artery atherosclerotic calcifications including left main coronary artery Disease. 5. Aortic Atherosclerosis and Emphysema.  6. Left kidney stone.  Lab results today (10/28/17) of CBC w/diff and Reticulocytes is as follows: all values are WNL except for HCT at 46.7%, Retic ct abs at 91.3k.  On review of systems, pt reports good energy levels, stable lower back pain, staying active, eating well, stable weight, and denies breathing changes, CP, SOB, unexpected weight loss, difficulty breathing, fevers, chills, night sweats, and any other symptoms.   MEDICAL HISTORY:  Past Medical History:  Diagnosis Date  . Anxiety   . Arthritis 2018  . Bulging lumbar disc 2018  . Complication of anesthesia    "low blood pressure after retinal detachment surgery"  . COPD (chronic obstructive pulmonary disease) ([place])   . Degenerative disc disease, lumbar 2018  . Depression   . Emphysema of lung ([place])   . History of bronchitis   . History of pneumonia    "multiple times"  . Hyperlipidemia   . Hyperlipidemia   . Kidney stones 02/2016  . lung ca 05/2003  . Lung cancer ([place])    s/p LUL lobectomy  . Osteoarthritis    spine, knees, hands  . PONV (postoperative nausea and vomiting)    pt "could not eat during hospital stay"  . Reflux esophagitis   . Rhinitis   . Scoliosis 2018  . Shortness of breath   . Wears glasses     SURGICAL HISTORY: Past Surgical History:  Procedure Laterality Date  . BREAST EXCISIONAL BIOPSY    . BREAST LUMPECTOMY WITH NEEDLE LOCALIZATION Right  04/11/2013   Procedure: BREAST LUMPECTOMY WITH NEEDLE LOCALIZATION;  Surgeon: Odis Hollingshead, MD;  Location: [facility];  Service: General;  Laterality: Right;  . BREAST SURGERY  1992   lt br bx  . CHOLECYSTECTOMY  11/11/2011   Procedure: LAPAROSCOPIC CHOLECYSTECTOMY WITH INTRAOPERATIVE CHOLANGIOGRAM;  Surgeon: Odis Hollingshead, MD;  Location: [facility];  Service: General;  Laterality:  N/A;  . COLONOSCOPY    . CYSTOSCOPY  12/10   for evaluation of hematuria  . DILATION AND CURETTAGE OF UTERUS    . ESOPHAGOGASTRODUODENOSCOPY    . EYE SURGERY     catataracts-both  . KNEE SURGERY     rt knee torn cartilage  . LUNG LOBECTOMY Left 4/05  . RETINAL DETACHMENT SURGERY  2011   left; x2  . SINUS SURGERY WITH INSTATRAK    . TUBAL LIGATION    . VIDEO BRONCHOSCOPY WITH ENDOBRONCHIAL NAVIGATION N/A 01/15/2015   Procedure: VIDEO BRONCHOSCOPY WITH ENDOBRONCHIAL NAVIGATION;  Surgeon: [name], MD;  Location: [facility];  Service: Thoracic;  Laterality: N/A;  . VOCAL CORD LATERALIZATION, ENDOSCOPIC APPROACH W/ MLB     nodule removed    SOCIAL HISTORY: Social History   Socioeconomic History  . Marital status: Married    Spouse name: Not on file  . Number of children: 2  . Years of education: Not on file  . Highest education level: Not on file  Occupational History  . Occupation: Retired  Scientific laboratory technician  . Financial resource strain: Not on file  . Food insecurity:    Worry: Not on file    Inability: Not on file  . Transportation needs:    Medical: Not on file    Non-medical: Not on file  Tobacco Use  . Smoking status: Former Smoker    Packs/day: 2.00    Years: 40.00    Pack years: 80.00    Last attempt to quit: 09/21/2008    Years since quitting: 9.1  . Smokeless tobacco: Never Used  Substance and Sexual Activity  . Alcohol use: Yes    Comment: SOCIALLY  . Drug use: No  . Sexual activity: Never    Partners: Male    Birth control/protection: Post-menopausal  Lifestyle  . Physical activity:    Days per week: Not on file    Minutes per session: Not on file  . Stress: Not on file  Relationships  . Social connections:    Talks on phone: Not on file    Gets together: Not on file    Attends religious service: Not on file    Active member of club or organization: Not on file    Attends meetings of clubs or organizations: Not on file    Relationship  status: Not on file  . Intimate partner violence:    Fear of current or ex partner: Not on file    Emotionally abused: Not on file    Physically abused: Not on file    Forced sexual activity: Not on file  Other Topics Concern  . Not on file  Social History Narrative  . Not on file    FAMILY HISTORY: Family History  Problem Relation Age of Onset  . Cancer Mother 25       ovarian cancer  . Hypertension Mother   . Diabetes Mother   . Alcohol abuse Father     ALLERGIES:  is allergic to adhesive [tape]; septra [sulfamethoxazole-trimethoprim]; and amoxicillin.  MEDICATIONS:  Current Outpatient Medications  Medication Sig  Dispense Refill  . acetaminophen (TYLENOL) 325 MG tablet Take 650 mg by mouth every 6 (six) hours as needed.    . ALPRAZolam (XANAX) 0.5 MG tablet Take 0.5 mg by mouth 2 (two) times daily as needed. For anxiety    . Biotin 5000 MCG CAPS Take 5,000 mcg by mouth 2 (two) times daily.    . Coenzyme Q10 (CO Q 10) 100 MG CAPS Take 400 mg by mouth daily.    . CYMBALTA 60 MG capsule Take 60 mg by mouth daily.     . Fluticasone Furoate-Vilanterol (BREO ELLIPTA) 100-25 MCG/INH AEPB Inhale 1 puff into the lungs daily.     . Glucosamine 500 MG CAPS Take 500 mg by mouth 3 (three) times daily.    . Meth-Hyo-M Bl-Na Phos-Ph Sal (URO-458) 81 MG TABS Take 1 tablet by mouth every 6 (six) hours as needed.  5  . rosuvastatin (CRESTOR) 5 MG tablet Take 5 mg by mouth 4 (four) times a week.    . Vitamin D, Ergocalciferol, (DRISDOL) 50000 units CAPS capsule TAKE 1 CAPSULE (50,000 UNITS TOTAL) BY MOUTH EVERY 14 (FOURTEEN) DAYS. 6 capsule 4  . albuterol (PROVENTIL HFA;VENTOLIN HFA) 108 (90 BASE) MCG/ACT inhaler Inhale 2 puffs into the lungs every 6 (six) hours as needed for wheezing or shortness of breath.    . ondansetron (ZOFRAN ODT) 4 MG disintegrating tablet Take 1 tablet (4 mg total) by mouth every 8 (eight) hours as needed for nausea or vomiting. (Patient not taking: Reported on  10/28/2017) 10 tablet 0   No current facility-administered medications for this visit.     REVIEW OF SYSTEMS:    A 10+ POINT REVIEW OF SYSTEMS WAS OBTAINED including neurology, dermatology, psychiatry, cardiac, respiratory, lymph, extremities, GI, GU, Musculoskeletal, constitutional, breasts, reproductive, HEENT.  All pertinent positives are noted in the HPI.  All others are negative.   PHYSICAL EXAMINATION: ECOG PERFORMANCE STATUS: 1 - Symptomatic but completely ambulatory  . Vitals:   10/28/17 1204  BP: (!) 96/57  Pulse: 71  Resp: 20  Temp: 98.2 F (36.8 C)  SpO2: 96%   Filed Weights   10/28/17 1204  Weight: 135 lb 14.4 oz (61.6 kg)   .Body mass index is 21.61 kg/m.  GENERAL:alert, in no acute distress and comfortable SKIN: no acute rashes, no significant lesions EYES: conjunctiva are pink and non-injected, sclera anicteric OROPHARYNX: MMM, no exudates, no oropharyngeal erythema or ulceration NECK: supple, no JVD LYMPH:  no palpable lymphadenopathy in the cervical, axillary or inguinal regions LUNGS: Decreased breath sounds, no rales or rhonchi  HEART: regular rate & rhythm ABDOMEN:  normoactive bowel sounds , non tender, not distended. No palpable hepatosplenomegaly.  Extremity: no pedal edema PSYCH: alert & oriented x 3 with fluent speech NEURO: no focal motor/sensory deficits   LABORATORY DATA:  I have reviewed the data as listed  . CBC Latest Ref Rng & Units 10/28/2017 06/07/2017 10/29/2016  WBC 3.9 - 10.3 K/uL 8.8 6.4 5.1  Hemoglobin 11.6 - 15.9 g/dL 15.2 14.4 14.9  Hematocrit 34.8 - 46.6 % 46.7(H) 43.3 44.9  Platelets 145 - 400 K/uL 306 283 201    . CMP Latest Ref Rng & Units 10/28/2017 06/07/2017 10/29/2016  Glucose 70 - 99 mg/dL 85 110(H) 112  BUN 8 - 23 mg/dL 17 19 12.9  Creatinine 0.44 - 1.00 mg/dL 0.75 0.70 0.8  Sodium 135 - 145 mmol/L 144 140 142  Potassium 3.5 - 5.1 mmol/L 5.4(H) 3.7 3.9  Chloride 98 - 111  mmol/L 108 109 -  CO2 22 - 32 mmol/L 27 23  28   Calcium 8.9 - 10.3 mg/dL 9.8 8.9 9.3  Total Protein 6.5 - 8.1 g/dL 7.4 6.7 6.5  Total Bilirubin 0.3 - 1.2 mg/dL 0.9 0.8 0.79  Alkaline Phos 38 - 126 U/L 79 76 85  AST 15 - 41 U/L 19 23 15   ALT 0 - 44 U/L 22 19 11     RADIOGRAPHIC STUDIES: I have personally reviewed the radiological images as listed and agreed with the findings in the report.  CT CHEST WITHOUT CONTRAST 09/23/2016 IMPRESSION: 1. Stable postinfectious/postinflammatory scar in the right upper Lobe. 2. Stable anterior left lower lobe pulmonary nodule average diameter 11 mm, for which 9 month stability has been demonstrated, probably benign. 3. Irregular 2.1 x 0.9 cm focus of consolidation in the subpleural peripheral left lower lobe, which has been mildly waxing and waning on multiple chest CT studies back to 03/06/2015, more suggestive of a benign etiology such as focal scarring with variable atelectasis. Continued chest CT surveillance is warranted in 6 months. 4. No adenopathy or other findings of metastatic disease in the chest . 5. Left main and 3 vessel coronary atherosclerosis .  MM SCREENING BREAST TOMO BILATERAL 06/10/2016 IMPRESSION: No mammographic evidence of malignancy. A result letter of this screening mammogram will be mailed directly to the patient.  CT CHEST WITHOUT CONTRAST 04/01/2016 IMPRESSION: Advanced centrilobular emphysema. Stable bilateral upper lobe pulmonary nodules. These have been stable dating back to 12/25/2015. Recommend continued follow-up with repeat CT in 1 year.  CT CHEST WITHOUT CONTRAST 12/25/2015 IMPRESSION: 1. The index nodule in the right upper lobe is unchanged when compared with previous exam with an equivalent diameter of 8 mm. 2. There is a new elongated nodule within the left lung which has an equivalent diameter of 11 mm. This is indeterminate. Consider short-term follow-up in 3-6 months for PET-CT for further investigation. 3. Emphysema 4. Aortic atherosclerosis  and coronary artery calcification.  CT CHEST WITHOUT CONTRAST 06/26/2015  TECHNIQUE: Multidetector CT imaging of the chest was performed following the standard protocol without IV contrast.  COMPARISON:  03/06/2015  FINDINGS: Mediastinum: The heart size appears normal. There is no pericardial effusion identified. Aortic atherosclerosis is noted. Calcification in the RCA, and LAD coronary artery noted. The trachea appears patent and is midline. Unremarkable appearance of the esophagus. There is no enlarged mediastinal or hilar adenopathy.  Lungs/Pleura: No pleural effusion identified. Advanced changes of centrilobular emphysema identified. Scar, architectural distortion and central nodule are again identified within the right upper lobe. Index nodule within the right upper lobe measures 6 x 10 mm, image 25 of series 4. Previously 9 x 16 mm. No new or progressive nodularity identified.  Upper Abdomen: Low density structure within the anterior aspect of the left lobe of liver measures 8 mm and is unchanged, image 108 of series 3. Normal appearance of the spleen. The adrenal glands are unremarkable. The visualized portions of the left kidney are normal.  Musculoskeletal: Spondylosis is noted within the thoracic spine. No aggressive lytic or sclerotic bone lesions chronic healed deformity involving the sternum.  IMPRESSION: 1. There has been continued retraction of irregular right upper lobe nodular density with surrounding scarring and architectural distortion. No new or progressive pulmonary nodularity or adenopathy identified. 2. Emphysema 3. Aortic atherosclerosis and coronary artery calcification.   Electronically Signed   By: Kerby Moors M.D.   On: 06/26/2015 13:11  EXAM: 2D DIGITAL SCREENING BILATERAL MAMMOGRAM WITH CAD AND  ADJUNCT TOMO  COMPARISON:  Previous exam(s).  ACR Breast Density Category c: The breast tissue is heterogeneously dense, which  may obscure small masses.  FINDINGS: There are no findings suspicious for malignancy. Images were processed with CAD.  IMPRESSION: No mammographic evidence of malignancy. A result letter of this screening mammogram will be mailed directly to the patient.  RECOMMENDATION: Screening mammogram in one year. (Code:SM-B-01Y)  BI-RADS CATEGORY  1: Negative.   Electronically Signed   By: Lovey Newcomer M.D.   On: 06/01/2015 07:48   ASSESSMENT & PLAN:   67 y.o.Caucasian female with history of COPD and prior history of left upper lobe stage IA non-small cell lung cancer status post left upper lobectomy here for her annual follow-up. CT showed evidence of 2 cm right upper lobe opacity. PET/CT scan did not showing definite evidence of a neoplastic nodule and cannot rule out the presence of inflammation/infection causing uneven FDG avidity.  #1 history of stage IA left upper lobe non-small cell lung cancer status post left upper lobectomy in 2005.  #2 new right upper lobe lung opacity - unresolved/slightly larger on f/u CT chest. Did not improve with steroids and empiric antibiotics. TB quantiferon assay neg. subsequent navigational bronchoscopy was negative for lung cancer. Follow-up CT scan on 03/06/2015 shows interval retraction of the irregular right upper lobe nodule with the imaging characteristics consistent with an inflammatory process. No other worrisome pulmonary nodules or adenopathy noted.  Repeat CT chest 06/26/2015 - continued retraction of the right upper lobe lesion with no new pulmonary nodules or adenopathy . Repeat CT chest on 09/23/2016-IMPRESSION: 1. Stable postinfectious/postinflammatory scar in the right upper lobe.   #3 history of COPD not on home oxygen severe emphysema. #4 ex-smoker quit in 2010. An 80-pack-year history of smoking. #5 atypical ductal hyperplasia - notes no new breast lumps on self-examination. Bilateral screening mammogram on 06/10/2016 with no evidence  of concerning breast lesions. 06/24/17 MM bilateral with no evidence of concerning breast lesions.   PLAN:  -Mammogram in May 2019 with PCP was unremarkable. -Discussed pt labwork today, 10/28/17; blood counts are stablle -Discussed the last CT Chest from 03/24/17 which revealed Stable appearance of left upper lobe pulmonary nodule with a mean diameter of 1 cm, for which 15 months of stability has been demonstrated, probably benign. 2. Previously noted area of subpleural consolidation in the periphery of the left lower lobe appears improved with residual pleuroparenchymal scarring. Likely post infectious or inflammatory. 3. Diffuse bronchial wall thickening with emphysema, as above; imaging findings suggestive of underlying COPD. 4. Aortic atherosclerosis and 3 vessel coronary artery atherosclerotic calcifications including left main coronary artery Disease. 5. Aortic Atherosclerosis and Emphysema.  6. Left kidney stone. -The pt shows no clinical or lab progression of lung cancer at this time.  -No indication for further treatment at this time.   -Continue follow up with Dr. Modesto Charon in cardiothoracic surgery in February 2020 with repeat CT imaging -Discussed that at this time the pt is fine to begin following up with me on an as needed basis  -elevated potassium from hemolysis - no further intervention recommended.  RTC with Dr. Irene Limbo as needed  All the patient's questions answered to her apparent satisfaction.  The total time spent in the appt was 20 minutes and more than 50% was on counseling and direct patient cares.    Sullivan Lone MD Christine AAHIVMS [facility] Hematology/Oncology Physician [place]  (Office):       [phone] (  Work cell):  [phone] (Fax):           [phone]  I, Baldwin [place], am acting as a scribe for Dr. Irene Limbo  .I have reviewed the above documentation for accuracy and completeness, and I agree with the above. Brunetta Genera MD

## 2017-10-28 ENCOUNTER — Inpatient Hospital Stay: Payer: Medicare Other | Attending: Hematology | Admitting: Hematology

## 2017-10-28 ENCOUNTER — Encounter: Payer: Self-pay | Admitting: Hematology

## 2017-10-28 ENCOUNTER — Inpatient Hospital Stay: Payer: Medicare Other

## 2017-10-28 VITALS — BP 96/57 | HR 71 | Temp 98.2°F | Resp 20 | Ht 66.5 in | Wt 135.9 lb

## 2017-10-28 DIAGNOSIS — F329 Major depressive disorder, single episode, unspecified: Secondary | ICD-10-CM | POA: Diagnosis not present

## 2017-10-28 DIAGNOSIS — J449 Chronic obstructive pulmonary disease, unspecified: Secondary | ICD-10-CM | POA: Diagnosis not present

## 2017-10-28 DIAGNOSIS — I2510 Atherosclerotic heart disease of native coronary artery without angina pectoris: Secondary | ICD-10-CM

## 2017-10-28 DIAGNOSIS — Z87891 Personal history of nicotine dependence: Secondary | ICD-10-CM

## 2017-10-28 DIAGNOSIS — C3412 Malignant neoplasm of upper lobe, left bronchus or lung: Secondary | ICD-10-CM | POA: Insufficient documentation

## 2017-10-28 DIAGNOSIS — F419 Anxiety disorder, unspecified: Secondary | ICD-10-CM | POA: Diagnosis not present

## 2017-10-28 DIAGNOSIS — Z79899 Other long term (current) drug therapy: Secondary | ICD-10-CM | POA: Diagnosis not present

## 2017-10-28 DIAGNOSIS — R911 Solitary pulmonary nodule: Secondary | ICD-10-CM

## 2017-10-28 LAB — CBC WITH DIFFERENTIAL (CANCER CENTER ONLY)
BASOS ABS: 0 10*3/uL (ref 0.0–0.1)
BASOS PCT: 0 %
Eosinophils Absolute: 0 10*3/uL (ref 0.0–0.5)
Eosinophils Relative: 1 %
HEMATOCRIT: 46.7 % — AB (ref 34.8–46.6)
HEMOGLOBIN: 15.2 g/dL (ref 11.6–15.9)
LYMPHS PCT: 18 %
Lymphs Abs: 1.5 10*3/uL (ref 0.9–3.3)
MCH: 29.2 pg (ref 25.1–34.0)
MCHC: 32.6 g/dL (ref 31.5–36.0)
MCV: 89.5 fL (ref 79.5–101.0)
MONOS PCT: 7 %
Monocytes Absolute: 0.6 10*3/uL (ref 0.1–0.9)
NEUTROS ABS: 6.5 10*3/uL (ref 1.5–6.5)
NEUTROS PCT: 74 %
Platelet Count: 306 10*3/uL (ref 145–400)
RBC: 5.22 MIL/uL (ref 3.70–5.45)
RDW: 13.3 % (ref 11.2–14.5)
WBC Count: 8.8 10*3/uL (ref 3.9–10.3)

## 2017-10-28 LAB — RETICULOCYTES
RBC.: 5.07 MIL/uL (ref 3.70–5.45)
RETIC COUNT ABSOLUTE: 91.3 10*3/uL — AB (ref 33.7–90.7)
Retic Ct Pct: 1.8 % (ref 0.7–2.1)

## 2017-10-28 LAB — COMPREHENSIVE METABOLIC PANEL
ALK PHOS: 79 U/L (ref 38–126)
ALT: 22 U/L (ref 0–44)
ANION GAP: 9 (ref 5–15)
AST: 19 U/L (ref 15–41)
Albumin: 4.2 g/dL (ref 3.5–5.0)
BUN: 17 mg/dL (ref 8–23)
CHLORIDE: 108 mmol/L (ref 98–111)
CO2: 27 mmol/L (ref 22–32)
Calcium: 9.8 mg/dL (ref 8.9–10.3)
Creatinine, Ser: 0.75 mg/dL (ref 0.44–1.00)
GFR calc non Af Amer: >60 mL/min (ref >60)
Glucose, Bld: 85 mg/dL (ref 70–99)
POTASSIUM: 5.4 mmol/L — AB (ref 3.5–5.1)
SODIUM: 144 mmol/L (ref 135–145)
Total Bilirubin: 0.9 mg/dL (ref 0.3–1.2)
Total Protein: 7.4 g/dL (ref 6.5–8.1)

## 2017-10-29 ENCOUNTER — Telehealth: Payer: Self-pay

## 2017-10-29 NOTE — Telephone Encounter (Signed)
RTC with Dr Irene Limbo as needed. Per 9/26 los

## 2018-02-12 ENCOUNTER — Other Ambulatory Visit: Payer: Self-pay | Admitting: *Deleted

## 2018-02-12 DIAGNOSIS — R918 Other nonspecific abnormal finding of lung field: Secondary | ICD-10-CM

## 2018-03-26 ENCOUNTER — Ambulatory Visit
Admission: RE | Admit: 2018-03-26 | Discharge: 2018-03-26 | Disposition: A | Discharge status: Home / Self Care | Payer: Medicare Other | Source: Ambulatory Visit | Attending: Thoracic Surgery (Cardiothoracic Vascular Surgery) | Admitting: Thoracic Surgery (Cardiothoracic Vascular Surgery)

## 2018-03-26 DIAGNOSIS — R918 Other nonspecific abnormal finding of lung field: Secondary | ICD-10-CM

## 2018-03-30 ENCOUNTER — Ambulatory Visit: Payer: Medicare Other | Admitting: Thoracic Surgery (Cardiothoracic Vascular Surgery)

## 2018-03-30 VITALS — BP 102/70 | HR 76 | Resp 20 | Ht 66.5 in | Wt 142.0 lb

## 2018-03-30 DIAGNOSIS — Z85118 Personal history of other malignant neoplasm of bronchus and lung: Secondary | ICD-10-CM

## 2018-03-30 DIAGNOSIS — R918 Other nonspecific abnormal finding of lung field: Secondary | ICD-10-CM

## 2018-03-30 NOTE — Progress Notes (Signed)
[address]       [place] [address]             [phone]     HPI: Ms. Fossum returns for an annual follow-up visit  Osa Fogarty is a 68 year old woman with an 80-pack-year history of tobacco abuse, COPD with bronchitis, stage Ia non-small cell carcinoma of the lung in 2005, hyperlipidemia, osteoarthritis, degenerative disc disease, anxiety, and depression.  He had a left upper lobectomy for stage Ia non-small cell carcinoma in 2005.  In October 2016 she was found to have a new right upper lobe lung nodule.  Navigational bronchoscopy showed inflammation.  She has been followed radiographically since that time.  I last saw her in the office in February 2019.  She was not having any new respiratory issues at that time.  Her lung nodules were stable.  Her primary complaint at that time was back pain.  Back pain continues to be her primary complaint.  She says her respiratory status is stable.  She gets short of breath if she tries to walk too fast or walk up a hill.  She is not having any issues at rest or with light activity.  Appetite is good, weight is stable.  Past Medical History:  Diagnosis Date  . Anxiety   . Arthritis 2018  . Bulging lumbar disc 2018  . Complication of anesthesia    "low blood pressure after retinal detachment surgery"  . COPD (chronic obstructive pulmonary disease) ([place])   . Degenerative disc disease, lumbar 2018  . Depression   . Emphysema of lung ([place])   . History of bronchitis   . History of pneumonia    "multiple times"  . Hyperlipidemia   . Hyperlipidemia   . Kidney stones 02/2016  . lung ca 05/2003  . Lung cancer ([place])    s/p LUL lobectomy  . Osteoarthritis    spine, knees, hands  . PONV (postoperative nausea and vomiting)    pt "could not eat during hospital stay"  . Reflux esophagitis   . Rhinitis   . Scoliosis 2018  . Shortness of breath   . Wears glasses     Current Outpatient Medications  Medication Sig  Dispense Refill  . acetaminophen (TYLENOL) 325 MG tablet Take 650 mg by mouth every 6 (six) hours as needed.    Marland Kitchen albuterol (PROVENTIL HFA;VENTOLIN HFA) 108 (90 BASE) MCG/ACT inhaler Inhale 2 puffs into the lungs every 6 (six) hours as needed for wheezing or shortness of breath.    . ALPRAZolam (XANAX) 0.5 MG tablet Take 0.5 mg by mouth 2 (two) times daily as needed. For anxiety    . Biotin 5000 MCG CAPS Take 5,000 mcg by mouth 2 (two) times daily.    . celecoxib (CELEBREX) 200 MG capsule     . Coenzyme Q10 (CO Q 10) 100 MG CAPS Take 400 mg by mouth daily.    . CYMBALTA 60 MG capsule Take 60 mg by mouth daily.     . Fluticasone Furoate-Vilanterol (BREO ELLIPTA) 100-25 MCG/INH AEPB Inhale 1 puff into the lungs daily.     . Glucosamine 500 MG CAPS Take 500 mg by mouth 3 (three) times daily.    . Meth-Hyo-M Bl-Na Phos-Ph Sal (URO-458) 81 MG TABS Take 1 tablet by mouth every 6 (six) hours as needed.  5  . ondansetron (ZOFRAN ODT) 4 MG disintegrating tablet Take 1 tablet (4 mg total) by mouth every 8 (eight) hours  as needed for nausea or vomiting. 10 tablet 0  . rosuvastatin (CRESTOR) 5 MG tablet Take 5 mg by mouth 4 (four) times a week.    . traMADol (ULTRAM) 50 MG tablet TAKE 1 TO 2 TABLETS BY MOUTH 3 TIMES A DAY    . Vitamin D, Ergocalciferol, (DRISDOL) 50000 units CAPS capsule TAKE 1 CAPSULE (50,000 UNITS TOTAL) BY MOUTH EVERY 14 (FOURTEEN) DAYS. 6 capsule 4   No current facility-administered medications for this visit.     Physical Exam BP 102/70   Pulse 76   Resp 20   Ht 5' 6.5" (1.689 m)   Wt 142 lb (64.4 kg)   LMP 09/26/2013 (Exact Date)   SpO2 95% Comment: RA  BMI 22.38 kg/m  68 year old woman in no acute distress Alert and oriented x3 with no focal deficits Lungs diminished breath sounds bilaterally, no wheezing Cardiac regular rate and rhythm No peripheral edema  Diagnostic Tests: CT CHEST WITHOUT CONTRAST  TECHNIQUE: Multidetector CT imaging of the chest was performed  following the standard protocol without IV contrast.  COMPARISON:  03/24/2017  FINDINGS: Cardiovascular: Normal heart size. No pericardial effusion. Aortic atherosclerosis. Calcification within the LAD and RCA coronary artery.  Mediastinum/Nodes: Normal appearance of the thyroid gland. The trachea appears patent and is midline. Normal appearance of the esophagus. No enlarged axillary or mediastinal adenopathy.  Lungs/Pleura: Postoperative changes from left upper lobectomy identified. There is associated left lung volume loss with shift of the mediastinum leftward. Linear density within the anterior left lung is similar to previous exam measuring 1.7 x 0.4 cm, image 54/8. Tiny left upper lung nodule is stable measuring 3 mm, image 23/8. Stable pleuroparenchymal scar and nodularity overlying the lateral aspect of the left lower lung, image 112/8. Right upper lobe scar and nodularity are unchanged, image 30/8, image 22/8, and image 20/8. advanced changes of centrilobular emphysema with diffuse bronchial wall thickening noted.  Upper Abdomen: No acute abnormality.  Musculoskeletal: No chest wall mass or suspicious bone lesions identified.  IMPRESSION: 1. Stable appearance of the chest. 2. Bilateral nodules appear unchanged when compared with previous exam. No new findings identified. 3. Diffuse bronchial wall thickening with emphysema, as above; imaging findings suggestive of underlying COPD. 4. Aortic atherosclerosis and coronary artery calcifications   Electronically Signed   By: Kerby Moors M.D.   On: 03/26/2018 11:49 I personally reviewed the CT images and concur with the findings noted above  Impression: Mayah Urquidi is a 68 year old woman with a history of tobacco abuse, COPD, lung cancer, hyperlipidemia, arthritis, degenerative disc disease, anxiety and depression.  She had a left upper lobectomy for stage Ia non-small cell carcinoma in 2005.  In 2016  she was noted to have new lung nodules.  Navigational bronchoscopy only showed inflammation.  She has been followed radiographically since then.  Lung nodules-no new or suspicious nodules on exam today.  Pre-existing nodules are stable.  COPD-severe centrilobular emphysema on CT.  Symptoms well controlled currently.  Tobacco abuse-quit 5 years ago.  She has no desire to resume smoking at this point.  She understands the importance of avoidance.  Plan: Return in 1 year with a low-dose CT of the chest to follow-up pulmonary nodules  [name], MD [facility] [phone]

## 2018-04-05 ENCOUNTER — Encounter: Payer: Self-pay | Admitting: Internal Medicine

## 2018-04-05 ENCOUNTER — Ambulatory Visit: Payer: Medicare Other | Admitting: Internal Medicine

## 2018-04-05 VITALS — BP 108/60 | HR 97 | Ht 66.5 in | Wt 142.2 lb

## 2018-04-05 DIAGNOSIS — I2510 Atherosclerotic heart disease of native coronary artery without angina pectoris: Secondary | ICD-10-CM | POA: Diagnosis not present

## 2018-04-05 DIAGNOSIS — R609 Edema, unspecified: Secondary | ICD-10-CM

## 2018-04-05 DIAGNOSIS — J449 Chronic obstructive pulmonary disease, unspecified: Secondary | ICD-10-CM | POA: Diagnosis not present

## 2018-04-05 DIAGNOSIS — E782 Mixed hyperlipidemia: Secondary | ICD-10-CM

## 2018-04-05 NOTE — Progress Notes (Signed)
Cardiology Office Note   Date:  04/05/2018   ID:  Tammy Eaton, DOB 12-Oct-1950, MRN [id number]  PCP:  Lujean Amel, MD  Cardiologist:   Dorris Carnes, MD    F/U of CAD       History of Present Illness: Tammy Eaton is a 68 y.o. female with a history of COPD, tob abuse, lung CA (s/p L upper lobectomy for nonsmall cel lung CA)    CT scan showed calcification of RCA and LAD I saw her in July   She was very active then  No complaints     SInce seen her breathing has been the same   She remains active  No CP    Does have trace edema during day taht increases at night  Gone by AM   Admits her diet has probably more salt than she should have     Current Meds  Medication Sig  . acetaminophen (TYLENOL) 325 MG tablet Take 650 mg by mouth every 6 (six) hours as needed.  Marland Kitchen albuterol (PROVENTIL HFA;VENTOLIN HFA) 108 (90 BASE) MCG/ACT inhaler Inhale 2 puffs into the lungs every 6 (six) hours as needed for wheezing or shortness of breath.  . ALPRAZolam (XANAX) 0.5 MG tablet Take 0.5 mg by mouth 2 (two) times daily as needed. For anxiety  . Biotin 5000 MCG CAPS Take 5,000 mcg by mouth 2 (two) times daily.  . celecoxib (CELEBREX) 200 MG capsule   . Coenzyme Q10 (CO Q 10) 100 MG CAPS Take 400 mg by mouth daily.  . CYMBALTA 60 MG capsule Take 60 mg by mouth daily.   . Fluticasone Furoate-Vilanterol (BREO ELLIPTA) 100-25 MCG/INH AEPB Inhale 1 puff into the lungs daily.   . Glucosamine 500 MG CAPS Take 500 mg by mouth 3 (three) times daily.  . Meth-Hyo-M Bl-Na Phos-Ph Sal (URO-458) 81 MG TABS Take 1 tablet by mouth every 6 (six) hours as needed.  . ondansetron (ZOFRAN ODT) 4 MG disintegrating tablet Take 1 tablet (4 mg total) by mouth every 8 (eight) hours as needed for nausea or vomiting.  . rosuvastatin (CRESTOR) 5 MG tablet Take 5 mg by mouth 4 (four) times a week.  . traMADol (ULTRAM) 50 MG tablet TAKE 1 TO 2 TABLETS BY MOUTH 3 TIMES A DAY  . Vitamin D, Ergocalciferol, (DRISDOL) 50000 units  CAPS capsule TAKE 1 CAPSULE (50,000 UNITS TOTAL) BY MOUTH EVERY 14 (FOURTEEN) DAYS.     Allergies:   Adhesive [tape]; Septra [sulfamethoxazole-trimethoprim]; and Amoxicillin   Past Medical History:  Diagnosis Date  . Anxiety   . Arthritis 2018  . Bulging lumbar disc 2018  . Complication of anesthesia    "low blood pressure after retinal detachment surgery"  . COPD (chronic obstructive pulmonary disease) ([place])   . Degenerative disc disease, lumbar 2018  . Depression   . Emphysema of lung ([place])   . History of bronchitis   . History of pneumonia    "multiple times"  . Hyperlipidemia   . Hyperlipidemia   . Kidney stones 02/2016  . lung ca 05/2003  . Lung cancer ([place])    s/p LUL lobectomy  . Osteoarthritis    spine, knees, hands  . PONV (postoperative nausea and vomiting)    pt "could not eat during hospital stay"  . Reflux esophagitis   . Rhinitis   . Scoliosis 2018  . Shortness of breath   . Wears glasses     Past Surgical History:  Procedure Laterality Date  .  BREAST EXCISIONAL BIOPSY    . BREAST LUMPECTOMY WITH NEEDLE LOCALIZATION Right 04/11/2013   Procedure: BREAST LUMPECTOMY WITH NEEDLE LOCALIZATION;  Surgeon: Odis Hollingshead, MD;  Location: [facility];  Service: General;  Laterality: Right;  . BREAST SURGERY  1992   lt br bx  . CHOLECYSTECTOMY  11/11/2011   Procedure: LAPAROSCOPIC CHOLECYSTECTOMY WITH INTRAOPERATIVE CHOLANGIOGRAM;  Surgeon: Odis Hollingshead, MD;  Location: [facility];  Service: General;  Laterality: N/A;  . COLONOSCOPY    . CYSTOSCOPY  12/10   for evaluation of hematuria  . DILATION AND CURETTAGE OF UTERUS    . ESOPHAGOGASTRODUODENOSCOPY    . EYE SURGERY     catataracts-both  . KNEE SURGERY     rt knee torn cartilage  . LUNG LOBECTOMY Left 4/05  . RETINAL DETACHMENT SURGERY  2011   left; x2  . SINUS SURGERY WITH INSTATRAK    . TUBAL LIGATION    . VIDEO BRONCHOSCOPY WITH ENDOBRONCHIAL NAVIGATION N/A 01/15/2015   Procedure: VIDEO  BRONCHOSCOPY WITH ENDOBRONCHIAL NAVIGATION;  Surgeon: [name], MD;  Location: [facility];  Service: Thoracic;  Laterality: N/A;  . VOCAL CORD LATERALIZATION, ENDOSCOPIC APPROACH W/ MLB     nodule removed     Social History:  The patient  reports that she quit smoking about 9 years ago. She has a 80.00 pack-year smoking history. She has never used smokeless tobacco. She reports current alcohol use. She reports that she does not use drugs.   Family History:  The patient's family history includes Alcohol abuse in her father; Cancer (age of onset: 34) in her mother; Diabetes in her mother; Hypertension in her mother.    ROS:  Please see the history of present illness. All other systems are reviewed and  Negative to the above problem except as noted.    PHYSICAL EXAM: VS:  BP 108/60   Pulse 97   Ht 5' 6.5" (1.689 m)   Wt 142 lb 3.2 oz (64.5 kg)   LMP 09/26/2013 (Exact Date)   SpO2 94%   BMI 22.61 kg/m   GEN:  Thin 68 yo in no acute distress  HEENT: normal  Neck: JVP is normal  No carotid bruits, or masses Cardiac: RRR; no murmurs, rubs, or gallops,no edema  Respiratory:  clear to auscultation bilaterally, normal work of breathing GI: soft, nontender, nondistended, + BS  No hepatomegaly  MS: no deformity Moving all extremities   Skin: warm and dry, no rash Neuro:  Strength and sensation are intact Psych: euthymic mood, full affect   EKG:  EKG is not ordered today.     Lipid Panel No results found for: CHOL, TRIG, HDL, CHOLHDL, VLDL, LDLCALC, LDLDIRECT    Wt Readings from Last 3 Encounters:  04/05/18 142 lb 3.2 oz (64.5 kg)  03/30/18 142 lb (64.4 kg)  10/28/17 135 lb 14.4 oz (61.6 kg)      ASSESSMENT AND PLAN:  1  CAD   Pt with CAD on CT scan   She is active and has symtpoms of angina   Follow 2  HL    LDL good   LDL is 71   HDL 53  3  Edema   Husband appears to be heavy handed with salt when cooking   She will have him cut back when he is cooking hers and let  us know if things worsen     3  COPD  SIgnificant    F/U in Dec 2020  Current medicines are reviewed at length with the patient today.  The patient does not have concerns regarding medicines.  Signed, Dorris Carnes, MD  04/05/2018 9:50 AM    [address], [place], [place]  [address] Phone: [phone]; Fax: [phone]

## 2018-04-05 NOTE — Patient Instructions (Signed)
Medication Instructions:  No changes If you need a refill on your cardiac medications before your next appointment, please call your pharmacy.   Lab work: none If you have labs (blood work) drawn today and your tests are completely normal, you will receive your results only by: Marland Kitchen MyChart Message (if you have MyChart) OR . A paper copy in the mail If you have any lab test that is abnormal or we need to change your treatment, we will call you to review the results.  Testing/Procedures: none  Follow-Up: At [facility], you and your health needs are our priority.  As part of our continuing mission to provide you with exceptional heart care, we have created designated Provider Care Teams.  These Care Teams include your primary Cardiologist (physician) and Advanced Practice Providers (APPs -  Physician Assistants and Nurse Practitioners) who all work together to provide you with the care you need, when you need it. You will need a follow up appointment in:   (nov, dec) 9 months.  Please call our office 2 months in advance to schedule this appointment.  You may see Dorris Carnes, MD or one of the following Advanced Practice Providers on your designated Care Team: Richardson Dopp, PA-C [place], [place] . Daune Perch, NP  Any Other Special Instructions Will Be Listed Below (If Applicable).

## 2018-05-17 ENCOUNTER — Telehealth: Payer: Self-pay | Admitting: Obstetrics & Gynecology

## 2018-05-17 NOTE — Telephone Encounter (Signed)
Patients symptoms of hot flashes and night sweats are likely from the Tramadol.  I understand she is concerned about ovarian cancer.  Our plan for her is to have a yearly routine pelvic ultrasound to check her ovaries.  If she is having pelvic pain or postmenopausal bleeding, then she needs an ultrasound in the near future.  Otherwise, I would recommend she return for her annual exam and pelvic ultrasound after the Covid 19 virus is under control.

## 2018-05-17 NOTE — Telephone Encounter (Signed)
Spoke with patient. Advised of message as seen below from [place]. Patient verbalizes understanding. Denies any pelvic pain or PMB. Will call if she has any new symptoms or concerns. Aware she will be contacted to schedule AEX with PUS after COVID 19 is under control as her appointment had to be cancelled. Encounter closed.

## 2018-05-17 NOTE — Telephone Encounter (Signed)
Spoke with patient. Patient states that over the last two months she has started having hot flashes and night sweats. Reports she has not had these symptoms in 11-20-22 years. Mother died of ovarian cancer and she is concerned. 2 months ago she started Celebrex 200 mg daily and Tramadol 50 mg 2 tablets by mouth daily. States she takes 2-4 Tramadol a day depending on her activity level for back pain. Reports symptoms started around the time she started taking these new medications. Patient is concerned if this could be from the medications or something else due to mother's medical history. Advised will review with MD and return call.

## 2018-05-17 NOTE — Telephone Encounter (Signed)
Patient is having severe hot flashes.

## 2018-05-25 ENCOUNTER — Ambulatory Visit: Payer: Medicare Other | Admitting: Obstetrics & Gynecology

## 2018-07-02 ENCOUNTER — Other Ambulatory Visit: Payer: Self-pay | Admitting: Obstetrics & Gynecology

## 2018-07-02 DIAGNOSIS — Z1231 Encounter for screening mammogram for malignant neoplasm of breast: Secondary | ICD-10-CM

## 2018-08-19 ENCOUNTER — Ambulatory Visit
Admission: RE | Admit: 2018-08-19 | Discharge: 2018-08-19 | Disposition: A | Discharge status: Home / Self Care | Payer: Medicare Other | Source: Ambulatory Visit | Attending: Obstetrics & Gynecology | Admitting: Obstetrics & Gynecology

## 2018-08-19 ENCOUNTER — Other Ambulatory Visit: Payer: Self-pay

## 2018-08-19 DIAGNOSIS — Z1231 Encounter for screening mammogram for malignant neoplasm of breast: Secondary | ICD-10-CM

## 2018-08-20 ENCOUNTER — Ambulatory Visit (INDEPENDENT_AMBULATORY_CARE_PROVIDER_SITE_OTHER): Payer: Medicare Other | Admitting: Obstetrics & Gynecology

## 2018-08-20 ENCOUNTER — Encounter: Payer: Self-pay | Admitting: Obstetrics & Gynecology

## 2018-08-20 VITALS — BP 102/70 | HR 88 | Temp 97.7°F | Ht 65.0 in | Wt 134.0 lb

## 2018-08-20 DIAGNOSIS — Z01411 Encounter for gynecological examination (general) (routine) with abnormal findings: Secondary | ICD-10-CM

## 2018-08-20 DIAGNOSIS — E559 Vitamin D deficiency, unspecified: Secondary | ICD-10-CM

## 2018-08-20 DIAGNOSIS — Z8041 Family history of malignant neoplasm of ovary: Secondary | ICD-10-CM

## 2018-08-20 MED ORDER — VITAMIN D (ERGOCALCIFEROL) 1.25 MG (50000 UNIT) PO CAPS: 50000.0000 [IU] | ORAL_CAPSULE | ORAL | 4 refills | Status: DC

## 2018-08-20 NOTE — Progress Notes (Addendum)
68 y.o. G48P2002 Married White or Caucasian female here for annual exam.  Denies vaginal bleeding.  Frustrated with the many trips and family events she's missed this summer.  H/o small cell carcinoma of the lung.  Had lobectomy in 2005.  Has lung nodules and followed with serial CT scan.  Followed by Dr. Roxan Hockey.  Was released by Dr. Irene Limbo, oncology.    Seeing Dr. Harrington Challenger.  Having a little fluid retention around her ankles.    Having some hot flashes related to mediation side effects.    Patient's last menstrual period was 09/26/2013 (exact date).          Sexually active: No.  The current method of family planning is post menopausal status.    Exercising: Yes.    yoga Smoker:  no  Health Maintenance: Pap:  04/07/17 Neg  02/20/14 neg. HR HPV:neg  History of abnormal Pap:  no MMG: 08/19/18 BIRADS1:neg Colonoscopy:  06/07/14 f/u 5 years  BMD:   06/10/16 Normal  TDaP:  2013 Pneumonia vaccine(s):  Done  Shingrix:   Completed  Hep C testing: 02/23/15 Neg  Screening Labs: Vit D    reports that she quit smoking about 9 years ago. She has a 80.00 pack-year smoking history. She has never used smokeless tobacco. She reports current alcohol use. She reports that she does not use drugs.  Past Medical History:  Diagnosis Date  . Anxiety   . Arthritis 2018  . Bulging lumbar disc 2018  . Complication of anesthesia    "low blood pressure after retinal detachment surgery"  . COPD (chronic obstructive pulmonary disease) ([place])   . Degenerative disc disease, lumbar 2018  . Depression   . Emphysema of lung ([place])   . History of bronchitis   . History of pneumonia    "multiple times"  . Hyperlipidemia   . Hyperlipidemia   . Kidney stones 02/2016  . lung ca 05/2003  . Lung cancer ([place])    s/p LUL lobectomy  . Osteoarthritis    spine, knees, hands  . PONV (postoperative nausea and vomiting)    pt "could not eat during hospital stay"  . Reflux esophagitis   . Rhinitis   . Scoliosis 2018  .  Shortness of breath   . Wears glasses     Past Surgical History:  Procedure Laterality Date  . BREAST EXCISIONAL BIOPSY    . BREAST LUMPECTOMY WITH NEEDLE LOCALIZATION Right 04/11/2013   Procedure: BREAST LUMPECTOMY WITH NEEDLE LOCALIZATION;  Surgeon: Odis Hollingshead, MD;  Location: [facility];  Service: General;  Laterality: Right;  . BREAST SURGERY  1992   lt br bx  . CHOLECYSTECTOMY  11/11/2011   Procedure: LAPAROSCOPIC CHOLECYSTECTOMY WITH INTRAOPERATIVE CHOLANGIOGRAM;  Surgeon: Odis Hollingshead, MD;  Location: [facility];  Service: General;  Laterality: N/A;  . COLONOSCOPY    . CYSTOSCOPY  12/10   for evaluation of hematuria  . DILATION AND CURETTAGE OF UTERUS    . ESOPHAGOGASTRODUODENOSCOPY    . EYE SURGERY     catataracts-both  . KNEE SURGERY     rt knee torn cartilage  . LUNG LOBECTOMY Left 4/05  . RETINAL DETACHMENT SURGERY  2011   left; x2  . SINUS SURGERY WITH INSTATRAK    . TUBAL LIGATION    . VIDEO BRONCHOSCOPY WITH ENDOBRONCHIAL NAVIGATION N/A 01/15/2015   Procedure: VIDEO BRONCHOSCOPY WITH ENDOBRONCHIAL NAVIGATION;  Surgeon: [name], MD;  Location: [facility];  Service: Thoracic;  Laterality: N/A;  .  VOCAL CORD LATERALIZATION, ENDOSCOPIC APPROACH W/ MLB     nodule removed    Current Outpatient Medications  Medication Sig Dispense Refill  . acetaminophen-codeine (TYLENOL #3) 300-30 MG tablet Take 1 tablet by mouth every 8 (eight) hours as needed.    . ALPRAZolam (XANAX) 1 MG tablet Take 0.5 mg by mouth 2 (two) times daily.    . baclofen (LIORESAL) 10 MG tablet TAKE 1 TABLET (10MG ) BY MOUTH 2 TIMES A DAY AS NEEDED    . BELSOMRA 10 MG TABS TAKE 1 TABLET BY MOUTH EVERY DAY AT BEDTIME AS NEEDED    . celecoxib (CELEBREX) 200 MG capsule     . CYMBALTA 60 MG capsule Take 60 mg by mouth daily.     . Fluticasone Furoate-Vilanterol (BREO ELLIPTA) 100-25 MCG/INH AEPB Inhale 1 puff into the lungs daily.     . Glucosamine 500 MG CAPS Take 500 mg by mouth 3  (three) times daily.    . ondansetron (ZOFRAN ODT) 4 MG disintegrating tablet Take 1 tablet (4 mg total) by mouth every 8 (eight) hours as needed for nausea or vomiting. 10 tablet 0  . rosuvastatin (CRESTOR) 5 MG tablet Take 5 mg by mouth 4 (four) times a week.    . traMADol (ULTRAM) 50 MG tablet TAKE 1 TO 2 TABLETS BY MOUTH 3 TIMES A DAY    . Vitamin D, Ergocalciferol, (DRISDOL) 50000 units CAPS capsule TAKE 1 CAPSULE (50,000 UNITS TOTAL) BY MOUTH EVERY 14 (FOURTEEN) DAYS. 6 capsule 4   No current facility-administered medications for this visit.     Family History  Problem Relation Age of Onset  . Cancer Mother 52       ovarian cancer  . Hypertension Mother   . Diabetes Mother   . Alcohol abuse Father     Review of Systems  All other systems reviewed and are negative.   Exam:   BP 102/70   Pulse 88   Temp 97.7 F (36.5 C) (Temporal)   Ht 5\' 5"  (1.651 m)   Wt 134 lb (60.8 kg)   LMP 09/26/2013 (Exact Date)   BMI 22.30 kg/m    Height: 5\' 5"  (165.1 cm)  Ht Readings from Last 3 Encounters:  08/20/18 5\' 5"  (1.651 m)  04/05/18 5' 6.5" (1.689 m)  03/30/18 5' 6.5" (1.689 m)    General appearance: alert, cooperative and appears stated age Head: Normocephalic, without obvious abnormality, atraumatic Neck: no adenopathy, supple, symmetrical, trachea midline and thyroid normal to inspection and palpation Lungs: clear to auscultation bilaterally Breasts: normal appearance, no masses or tenderness Heart: regular rate and rhythm Abdomen: soft, non-tender; bowel sounds normal; no masses,  no organomegaly Extremities: extremities normal, atraumatic, no cyanosis or edema Skin: Skin color, texture, turgor normal. No rashes or lesions Lymph nodes: Cervical, supraclavicular, and axillary nodes normal. No abnormal inguinal nodes palpated Neurologic: Grossly normal   Pelvic: External genitalia:  no lesions              Urethra:  normal appearing urethra with no masses, tenderness or  lesions              Bartholins and Skenes: normal                 Vagina: normal appearing vagina with normal color and discharge, no lesions              Cervix: no lesions              Pap  taken: No. Bimanual Exam:  Uterus:  normal size, contour, position, consistency, mobility, non-tender              Adnexa: normal adnexa and no mass, fullness, tenderness               Rectovaginal: Confirms               Anus:  normal sphincter tone, no lesions  Chaperone was present for exam.  A:  Well Woman with normal exam PMP, no HRT Family hx of ovarian cancer in her mother Coronary artery calcifications Vit D deficiency Pulmonary nodules  P:   Mammogram guidelines reviewed pap smear 2019 that was negative.  Not indicated today.   Desires to have yearly PUS Vit D obtained today.  Using Vit D 50K every 14 days.  #6/4RF. Lab work done with Dr. Dorthy Cooler Return annually or prn

## 2018-08-20 NOTE — Addendum Note (Signed)
Addended by: Polly Cobia on: 08/20/2018 04:47 PM   Modules accepted: Orders

## 2018-08-21 LAB — VITAMIN D 25 HYDROXY (VIT D DEFICIENCY, FRACTURES): Vit D, 25-Hydroxy: 54.6 ng/mL (ref 30.0–100.0)

## 2018-08-23 ENCOUNTER — Telehealth: Payer: Self-pay | Admitting: *Deleted

## 2018-08-23 NOTE — Telephone Encounter (Signed)
-----   Message from Megan Salon, MD sent at 08/20/2018  4:04 PM EDT ----- Regarding: PUS Tammy Eaton Will you call this pt and schedule PUS due to family of ovarian cancer with her mother.    Thanks.  Vinnie Level

## 2018-08-23 NOTE — Telephone Encounter (Signed)
Spoke with patient.   PUS scheduled by S. Dixon for 08/26/18 at 2pm with consult to follow at 2:30pm with Dr. Sabra Heck.   Routing to provider for final review. Patient is agreeable to disposition. Will close encounter.  Cc: Lerry Liner

## 2018-08-26 ENCOUNTER — Ambulatory Visit: Payer: Medicare Other | Admitting: Obstetrics & Gynecology

## 2018-08-26 ENCOUNTER — Other Ambulatory Visit: Payer: Self-pay

## 2018-08-26 ENCOUNTER — Encounter: Payer: Self-pay | Admitting: Obstetrics & Gynecology

## 2018-08-26 ENCOUNTER — Ambulatory Visit (INDEPENDENT_AMBULATORY_CARE_PROVIDER_SITE_OTHER): Payer: Medicare Other

## 2018-08-26 VITALS — BP 92/60 | HR 76 | Temp 98.2°F | Ht 65.0 in | Wt 135.0 lb

## 2018-08-26 DIAGNOSIS — Z8041 Family history of malignant neoplasm of ovary: Secondary | ICD-10-CM | POA: Diagnosis not present

## 2018-08-26 NOTE — Progress Notes (Signed)
68 y.o. G71P2002 Married White or Caucasian female here for pelvic ultrasound due to family hx of ovarian cancer in her mother.  Denies vaginal bleeding.  Denies GI issues.  Patient's last menstrual period was 09/26/2013 (exact date).  Contraception: PMP, h/o BTL  Findings:  UTERUS: 6.2 x 4.5 x 2.9cm EMS: 2.26mm ADNEXA: Left ovary: 1.7 x 0.9 x 0.9WJ with a complicated Nabothian cyst noted again       Right ovary: 1.7 x 1.3 x 0.9, calcification clusters noted on both ovaries today.  Calcifications seen a year ago. CUL DE SAC: no free fluid  Discussion:  Findings reviewed.  Calcifications on ovaries noted.  Nabothian cyst, complicated, noted again on ultrasound  Assessment:  Family hx of ovarian cancer  Plan:  She desires repeating PUS in one year  ~15 minutes spent with patient >50% of time was in face to face discussion of above.      ADDENDUM; UTERUS - ATROPHIC ENDOMETRIUM APPEARS ECHOGENIC, THIN BILATERAL OVARIES APPEAR ATROPHIC, BOTH WITH LATERAL CALCIFICATION CLUSTERS

## 2018-12-23 NOTE — Progress Notes (Signed)
Cardiology Office Note   Date:  12/24/2018   ID:  [name], Laham February 17, 1950, MRN [id number]  PCP:  Lujean Amel, MD  Cardiologist:   Dorris Carnes, MD    F/U of CAD       History of Present Illness: Tammy Eaton is a 68 y.o. female with a history of COPD, tob abuse, lung CA (s/p L upper lobectomy for nonsmall cel lung CA)    CT scan showed calcification of RCA and LAD I saw her in March 2020    Stressful year   Daughter in law with heart valve problems  Antoerh daughter in law with Ca of breast   Cares for husband  The patient nnotes edema in legs is new  Has SOB with activity  Also has some occasinal chest tightness  The pt says she is not eating salty food  Cut way back    Current Meds  Medication Sig  . acetaminophen-codeine (TYLENOL #3) 300-30 MG tablet Take 1 tablet by mouth every 8 (eight) hours as needed.  . ALPRAZolam (XANAX) 1 MG tablet Take 0.5 mg by mouth 2 (two) times daily.  . baclofen (LIORESAL) 10 MG tablet TAKE 1 TABLET (10MG ) BY MOUTH 2 TIMES A DAY AS NEEDED  . BELSOMRA 10 MG TABS TAKE 1 TABLET BY MOUTH EVERY DAY AT BEDTIME AS NEEDED  . celecoxib (CELEBREX) 200 MG capsule   . CYMBALTA 60 MG capsule Take 60 mg by mouth daily.   . Fluticasone Furoate-Vilanterol (BREO ELLIPTA) 100-25 MCG/INH AEPB Inhale 1 puff into the lungs daily.   . Glucosamine 500 MG CAPS Take 500 mg by mouth 3 (three) times daily.  . ondansetron (ZOFRAN ODT) 4 MG disintegrating tablet Take 1 tablet (4 mg total) by mouth every 8 (eight) hours as needed for nausea or vomiting.  . rosuvastatin (CRESTOR) 5 MG tablet Take 5 mg by mouth 4 (four) times a week.  . traMADol (ULTRAM) 50 MG tablet TAKE 1 TO 2 TABLETS BY MOUTH 3 TIMES A DAY  . Vitamin D, Ergocalciferol, (DRISDOL) 1.25 MG (50000 UT) CAPS capsule Take 1 capsule (50,000 Units total) by mouth every 14 (fourteen) days.     Allergies:   Adhesive [tape], Septra [sulfamethoxazole-trimethoprim], and Amoxicillin   Past  Medical History:  Diagnosis Date  . Anxiety   . Arthritis 2018  . Bulging lumbar disc 2018  . Complication of anesthesia    "low blood pressure after retinal detachment surgery"  . COPD (chronic obstructive pulmonary disease) ([place])   . Degenerative disc disease, lumbar 2018  . Depression   . Emphysema of lung ([place])   . History of bronchitis   . History of pneumonia    "multiple times"  . Hyperlipidemia   . Hyperlipidemia   . Kidney stones 02/2016  . lung ca 05/2003  . Lung cancer ([place])    s/p LUL lobectomy  . Osteoarthritis    spine, knees, hands  . PONV (postoperative nausea and vomiting)    pt "could not eat during hospital stay"  . Reflux esophagitis   . Rhinitis   . Scoliosis 2018  . Shortness of breath   . Wears glasses     Past Surgical History:  Procedure Laterality Date  . BREAST EXCISIONAL BIOPSY    . BREAST LUMPECTOMY WITH NEEDLE LOCALIZATION Right 04/11/2013   Procedure: BREAST LUMPECTOMY WITH NEEDLE LOCALIZATION;  Surgeon: Odis Hollingshead, MD;  Location: [place] [facility];  Service: General;  Laterality: Right;  .  BREAST SURGERY  1992   lt br bx  . CHOLECYSTECTOMY  11/11/2011   Procedure: LAPAROSCOPIC CHOLECYSTECTOMY WITH INTRAOPERATIVE CHOLANGIOGRAM;  Surgeon: Odis Hollingshead, MD;  Location: [facility];  Service: General;  Laterality: N/A;  . COLONOSCOPY    . CYSTOSCOPY  12/10   for evaluation of hematuria  . DILATION AND CURETTAGE OF UTERUS    . ESOPHAGOGASTRODUODENOSCOPY    . EYE SURGERY     catataracts-both  . KNEE SURGERY     rt knee torn cartilage  . LUNG LOBECTOMY Left 4/05  . RETINAL DETACHMENT SURGERY  2011   left; x2  . SINUS SURGERY WITH INSTATRAK    . TUBAL LIGATION    . VIDEO BRONCHOSCOPY WITH ENDOBRONCHIAL NAVIGATION N/A 01/15/2015   Procedure: VIDEO BRONCHOSCOPY WITH ENDOBRONCHIAL NAVIGATION;  Surgeon: [name], MD;  Location: [facility];  Service: Thoracic;  Laterality: N/A;  . VOCAL CORD LATERALIZATION, ENDOSCOPIC APPROACH  W/ MLB     nodule removed     Social History:  The patient  reports that she quit smoking about 10 years ago. She has a 80.00 pack-year smoking history. She has never used smokeless tobacco. She reports current alcohol use. She reports that she does not use drugs.   Family History:  The patient's family history includes Alcohol abuse in her father; Cancer (age of onset: 74) in her mother; Diabetes in her mother; Hypertension in her mother.    ROS:  Please see the history of present illness. All other systems are reviewed and  Negative to the above problem except as noted.    PHYSICAL EXAM: VS:  BP 112/78   Pulse 80   Ht 5' 5.5" (1.664 m)   Wt 137 lb 12.8 oz (62.5 kg)   LMP 09/26/2013 (Exact Date)   BMI 22.58 kg/m   GEN:  Thin 68 yo in no acute distress  HEENT: normal  Neck: JVP is normal  No carotid bruits, or masses Cardiac: RRR; no murmurs, rubs, or gallops,Tr edema  Respiratory:  clear to auscultation bilaterally, normal work of breathing GI: soft, nontender, nondistended, + BS  No hepatomegaly  MS: no deformity Moving all extremities   Skin: warm and dry, no rash  Feet ruborours Neuro:  Strength and sensation are intact Psych: euthymic mood, full affect   EKG:  EKG is ordered today.  SR 80   Occcaional PVC     Lipid Panel No results found for: CHOL, TRIG, HDL, CHOLHDL, VLDL, LDLCALC, LDLDIRECT    Wt Readings from Last 3 Encounters:  12/24/18 137 lb 12.8 oz (62.5 kg)  08/26/18 135 lb (61.2 kg)  08/20/18 134 lb (60.8 kg)      ASSESSMENT AND PLAN:  1  Edema   Pt has trace edema   Volume looks OK otherwise    Will check labs   Will also set up for echo  2   CAD   Pt with edema and some SOB/tightness  Will review echo   Based on findings consider other tsting  (CT, cath)  3  HL    LDL Check lpids     4  COPD  SIgnificant      Current medicines are reviewed at length with the patient today.  The patient does not have concerns regarding medicines.  Signed,  Dorris Carnes, MD  12/24/2018 3:04 PM    [facility] [address], [place]  [address] Phone: [phone]; Fax: [phone]

## 2018-12-24 ENCOUNTER — Encounter (INDEPENDENT_AMBULATORY_CARE_PROVIDER_SITE_OTHER): Payer: Self-pay

## 2018-12-24 ENCOUNTER — Encounter: Payer: Self-pay | Admitting: Internal Medicine

## 2018-12-24 ENCOUNTER — Other Ambulatory Visit: Payer: Self-pay

## 2018-12-24 ENCOUNTER — Ambulatory Visit: Payer: Medicare Other | Admitting: Internal Medicine

## 2018-12-24 VITALS — BP 112/78 | HR 80 | Ht 65.5 in | Wt 137.8 lb

## 2018-12-24 DIAGNOSIS — I2510 Atherosclerotic heart disease of native coronary artery without angina pectoris: Secondary | ICD-10-CM

## 2018-12-24 DIAGNOSIS — R609 Edema, unspecified: Secondary | ICD-10-CM | POA: Diagnosis not present

## 2018-12-24 NOTE — Patient Instructions (Signed)
Medication Instructions:  No changes *If you need a refill on your cardiac medications before your next appointment, please call your pharmacy*  Lab Work: Today: BMET, CBC, TSH, BNP, LIPIDS, ESR, ANA  If you have labs (blood work) drawn today and your tests are completely normal, you will receive your results only by: Marland Kitchen MyChart Message (if you have MyChart) OR . A paper copy in the mail If you have any lab test that is abnormal or we need to change your treatment, we will call you to review the results.  Testing/Procedures: Your physician has requested that you have an echocardiogram. Echocardiography is a painless test that uses sound waves to create images of your heart. It provides your doctor with information about the size and shape of your heart and how well your heart's chambers and valves are working. This procedure takes approximately one hour. There are no restrictions for this procedure.  Follow-Up: Follow up with your physician will depend on test results.   Other Instructions

## 2018-12-25 LAB — CBC
Hematocrit: 48.6 % — ABNORMAL HIGH (ref 34.0–46.6)
Hemoglobin: 16.6 g/dL — ABNORMAL HIGH (ref 11.1–15.9)
MCH: 30.1 pg (ref 26.6–33.0)
MCHC: 34.2 g/dL (ref 31.5–35.7)
MCV: 88 fL (ref 79–97)
Platelets: 263 10*3/uL (ref 150–450)
RBC: 5.51 x10E6/uL — ABNORMAL HIGH (ref 3.77–5.28)
RDW: 12.6 % (ref 11.7–15.4)
WBC: 6.9 10*3/uL (ref 3.4–10.8)

## 2018-12-25 LAB — BASIC METABOLIC PANEL
BUN/Creatinine Ratio: 19 (ref 12–28)
BUN: 13 mg/dL (ref 8–27)
CO2: 23 mmol/L (ref 20–29)
Calcium: 9.6 mg/dL (ref 8.7–10.3)
Chloride: 102 mmol/L (ref 96–106)
Creatinine, Ser: 0.69 mg/dL (ref 0.57–1.00)
GFR calc Af Amer: 103 mL/min/{1.73_m2} (ref >59)
GFR calc non Af Amer: 90 mL/min/{1.73_m2} (ref >59)
Glucose: 105 mg/dL — ABNORMAL HIGH (ref 65–99)
Potassium: 4.4 mmol/L (ref 3.5–5.2)
Sodium: 141 mmol/L (ref 134–144)

## 2018-12-25 LAB — LIPID PANEL
Chol/HDL Ratio: 2.8 ratio (ref 0.0–4.4)
Cholesterol, Total: 148 mg/dL (ref 100–199)
HDL: 53 mg/dL (ref >39)
LDL Chol Calc (NIH): 62 mg/dL (ref 0–99)
Triglycerides: 202 mg/dL — ABNORMAL HIGH (ref 0–149)
VLDL Cholesterol Cal: 33 mg/dL (ref 5–40)

## 2018-12-25 LAB — PRO B NATRIURETIC PEPTIDE: NT-Pro BNP: 70 pg/mL (ref 0–301)

## 2018-12-25 LAB — ANA: Anti Nuclear Antibody (ANA): NEGATIVE

## 2018-12-25 LAB — TSH: TSH: 1.97 u[IU]/mL (ref 0.450–4.500)

## 2018-12-25 LAB — SEDIMENTATION RATE: Sed Rate: 13 mm/hr (ref 0–40)

## 2018-12-28 ENCOUNTER — Telehealth: Payer: Self-pay | Admitting: *Deleted

## 2018-12-28 MED ORDER — FUROSEMIDE 20 MG PO TABS: ORAL_TABLET | ORAL | 3 refills | Status: DC

## 2018-12-28 MED ORDER — POTASSIUM CHLORIDE ER 10 MEQ PO TBCR: EXTENDED_RELEASE_TABLET | ORAL | 3 refills | Status: DC

## 2018-12-28 NOTE — Telephone Encounter (Signed)
-----   Message from Fay Records, MD sent at 12/26/2018  5:40 PM EST ----- CBC:  Hgb is up a little  Probably due to COPD Thyrod functoin is normal Lipids are very good Markers of inflammation are negative/normal Fluids overall appears OK   Could try 20 mg Lasix with 10 KCL 2x per week at most    Watch salt intake Forward labs to PCP

## 2018-12-28 NOTE — Telephone Encounter (Signed)
Spoke with patient and informed of results. She will try to reduce sodium and will take lasix one-two times weekly.  Will take potassium with each lasix dose. Labs forwarded to PCP.  She has her physical scheduled with him next month.

## 2019-01-12 ENCOUNTER — Ambulatory Visit (HOSPITAL_COMMUNITY): Payer: Medicare Other | Attending: Internal Medicine

## 2019-01-12 ENCOUNTER — Other Ambulatory Visit: Payer: Self-pay

## 2019-01-12 DIAGNOSIS — I2510 Atherosclerotic heart disease of native coronary artery without angina pectoris: Secondary | ICD-10-CM | POA: Diagnosis not present

## 2019-01-12 DIAGNOSIS — R609 Edema, unspecified: Secondary | ICD-10-CM | POA: Diagnosis not present

## 2019-01-17 ENCOUNTER — Encounter: Payer: Self-pay | Admitting: *Deleted

## 2019-01-17 ENCOUNTER — Other Ambulatory Visit: Payer: Self-pay | Admitting: *Deleted

## 2019-01-17 DIAGNOSIS — R0602 Shortness of breath: Secondary | ICD-10-CM

## 2019-01-17 DIAGNOSIS — R0789 Other chest pain: Secondary | ICD-10-CM

## 2019-01-17 DIAGNOSIS — I2510 Atherosclerotic heart disease of native coronary artery without angina pectoris: Secondary | ICD-10-CM

## 2019-01-18 ENCOUNTER — Telehealth (HOSPITAL_COMMUNITY): Payer: Self-pay | Admitting: *Deleted

## 2019-01-18 NOTE — Telephone Encounter (Signed)
Patient given detailed instructions per Myocardial Perfusion Study Information Sheet for the test on 01/21/19 at 7:30. Patient notified to arrive 15 minutes early and that it is imperative to arrive on time for appointment to keep from having the test rescheduled.  If you need to cancel or reschedule your appointment, please call the office within 24 hours of your appointment. . Patient verbalized understanding.Veronia Beets

## 2019-01-21 ENCOUNTER — Other Ambulatory Visit: Payer: Self-pay

## 2019-01-21 ENCOUNTER — Ambulatory Visit (HOSPITAL_COMMUNITY): Payer: Medicare Other | Attending: Cardiovascular Disease

## 2019-01-21 DIAGNOSIS — R0602 Shortness of breath: Secondary | ICD-10-CM

## 2019-01-21 DIAGNOSIS — R0789 Other chest pain: Secondary | ICD-10-CM | POA: Diagnosis present

## 2019-01-21 DIAGNOSIS — I2510 Atherosclerotic heart disease of native coronary artery without angina pectoris: Secondary | ICD-10-CM

## 2019-01-21 LAB — MYOCARDIAL PERFUSION IMAGING
LV dias vol: 63 mL (ref 46–106)
LV sys vol: 21 mL
Peak HR: 93 {beats}/min
Rest HR: 78 {beats}/min
SDS: 0
SRS: 2
SSS: 2
TID: 1.08

## 2019-01-21 MED ORDER — TECHNETIUM TC 99M TETROFOSMIN IV KIT
10.2000 | PACK | Freq: Once | INTRAVENOUS | Status: AC | PRN
  Administered 2019-01-21: 10.2 via INTRAVENOUS
  Filled 2019-01-21: qty 11

## 2019-01-21 MED ORDER — TECHNETIUM TC 99M TETROFOSMIN IV KIT
31.7000 | PACK | Freq: Once | INTRAVENOUS | Status: AC | PRN
  Administered 2019-01-21: 31.7 via INTRAVENOUS
  Filled 2019-01-21: qty 32

## 2019-01-21 MED ORDER — REGADENOSON 0.4 MG/5ML IV SOLN
0.4000 mg | Freq: Once | INTRAVENOUS | Status: AC
  Administered 2019-01-21: 0.4 mg via INTRAVENOUS

## 2019-02-07 ENCOUNTER — Ambulatory Visit: Payer: Medicare Other | Admitting: Pulmonary Disease

## 2019-02-07 ENCOUNTER — Other Ambulatory Visit: Payer: Self-pay

## 2019-02-07 ENCOUNTER — Encounter: Payer: Self-pay | Admitting: Pulmonary Disease

## 2019-02-07 VITALS — BP 118/66 | HR 77 | Temp 98.2°F | Ht 65.5 in | Wt 142.0 lb

## 2019-02-07 DIAGNOSIS — J449 Chronic obstructive pulmonary disease, unspecified: Secondary | ICD-10-CM | POA: Diagnosis not present

## 2019-02-07 MED ORDER — BREO ELLIPTA 200-25 MCG/INH IN AEPB: 1.0000 | INHALATION_SPRAY | Freq: Every day | RESPIRATORY_TRACT | 0 refills | Status: DC

## 2019-02-07 MED ORDER — FLUTICASONE-SALMETEROL 100-50 MCG/DOSE IN AEPB: 1.0000 | INHALATION_SPRAY | Freq: Two times a day (BID) | RESPIRATORY_TRACT | 6 refills | Status: DC

## 2019-02-07 NOTE — Progress Notes (Addendum)
Subjective:   PATIENT ID: Tammy Eaton GENDER: female DOB: 1950-07-08, MRN: [id number]   HPI  Chief Complaint  Patient presents with  . Consult    referred by PCP for copd.  previously seen by MW in 2016. c/o increased chest congestion, sinus congestion since 04/2018.     Reason for Visit: New consult for shortness of breath  [name] is a 69 year old female with COPD, history of lung nodule, history of lung cancer 2004, anxiety/depression, GERD who presents for new consultation for COPD management.  Referral sent by her family medicine provider at [facility], Tawni Carnes, MD.  Patient had requested referral to pulmonologist.  On review of PCP note from 01/26/2019, she is currently on Breo 100-25 for her COPD.  She has a history of lung cancer and was released by Dr. Irene Limbo.  Dr. Roxan Hockey is monitoring 2 nodules after biopsy.  She was diagnosed with COPD 15 years ago. She was only started [place] five years ago. Denies COPD exacerbations requiring ED or hospitalizations. She rarely gets treated for bronchitis or exacerbations as an outpatient. She quit smoking 2010. Since March 2020, she reports an mild episode of nasal congestion, shortness of breath and cough. Associated with burning eyes but no fever. She was negative for COVID. Nasal congestion has resolved. She reports persistent chest congestion associated with productive cough. Her dysnea occurs with stress but she feels this is a more chronic issue that is exacerbated with cough. Denies wheezing. She has a recent cardiac work-up which is negative. She enjoys gardening and still able to work through it but her symptoms are not controlled at baseline.  Social History: Former smoker. 80 pack years. Quit in 2010.  I have personally reviewed patient's past medical/family/social history, allergies, current medications.  Past Medical History:  Diagnosis Date  . Anxiety   . Arthritis 2018  . Bulging lumbar disc 2018    . Complication of anesthesia    "low blood pressure after retinal detachment surgery"  . COPD (chronic obstructive pulmonary disease) ([place])   . Degenerative disc disease, lumbar 2018  . Depression   . Emphysema of lung ([place])   . History of bronchitis   . History of pneumonia    "multiple times"  . Hyperlipidemia   . Hyperlipidemia   . Kidney stones 02/2016  . lung ca 05/2003  . Lung cancer ([place])    s/p LUL lobectomy  . Osteoarthritis    spine, knees, hands  . PONV (postoperative nausea and vomiting)    pt "could not eat during hospital stay"  . Reflux esophagitis   . Rhinitis   . Scoliosis 2018  . Shortness of breath   . Wears glasses      Family History  Problem Relation Age of Onset  . Cancer Mother 62       ovarian cancer  . Hypertension Mother   . Diabetes Mother   . Alcohol abuse Father      Social History   Occupational History  . Occupation: Retired  Tobacco Use  . Smoking status: Former Smoker    Packs/day: 2.00    Years: 40.00    Pack years: 80.00    Quit date: 09/21/2008    Years since quitting: 10.3  . Smokeless tobacco: Never Used  Substance and Sexual Activity  . Alcohol use: Yes    Alcohol/week: 0.0 - 1.0 standard drinks  . Drug use: No  . Sexual activity: Not Currently    Partners: Male  Birth control/protection: Post-menopausal    Allergies  Allergen Reactions  . Adhesive [Tape] Other (See Comments)    Burned skin  . Septra [Sulfamethoxazole-Trimethoprim] Hives  . Amoxicillin Other (See Comments)    Had a case of thrush     Outpatient Medications Prior to Visit  Medication Sig Dispense Refill  . acetaminophen-codeine (TYLENOL #3) 300-30 MG tablet Take 1 tablet by mouth every 8 (eight) hours as needed.    . ALPRAZolam (XANAX) 1 MG tablet Take 0.5 mg by mouth 2 (two) times daily.    . baclofen (LIORESAL) 10 MG tablet TAKE 1 TABLET (10MG ) BY MOUTH 2 TIMES A DAY AS NEEDED    . BELSOMRA 10 MG TABS TAKE 1 TABLET BY MOUTH EVERY DAY AT  BEDTIME AS NEEDED    . celecoxib (CELEBREX) 200 MG capsule     . CYMBALTA 60 MG capsule Take 60 mg by mouth daily.     . Fluticasone Furoate-Vilanterol (BREO ELLIPTA) 100-25 MCG/INH AEPB Inhale 1 puff into the lungs daily.     . furosemide (LASIX) 20 MG tablet Take one tablet by mouth 2 times per week as needed for swelling 30 tablet 3  . Glucosamine 500 MG CAPS Take 500 mg by mouth 3 (three) times daily.    . ondansetron (ZOFRAN ODT) 4 MG disintegrating tablet Take 1 tablet (4 mg total) by mouth every 8 (eight) hours as needed for nausea or vomiting. 10 tablet 0  . potassium chloride (KLOR-CON) 10 MEQ tablet Take one tablet by mouth two time a week as needed--take with lasix 30 tablet 3  . rosuvastatin (CRESTOR) 5 MG tablet Take 5 mg by mouth 4 (four) times a week.    . traMADol (ULTRAM) 50 MG tablet TAKE 1 TO 2 TABLETS BY MOUTH 3 TIMES A DAY    . Vitamin D, Ergocalciferol, (DRISDOL) 1.25 MG (50000 UT) CAPS capsule Take 1 capsule (50,000 Units total) by mouth every 14 (fourteen) days. 6 capsule 4   No facility-administered medications prior to visit.    Review of Systems  Constitutional: Negative for chills, diaphoresis, fever, malaise/fatigue and weight loss.  HENT: Negative for congestion, ear pain and sore throat.   Respiratory: Positive for cough, sputum production and shortness of breath. Negative for hemoptysis and wheezing.   Cardiovascular: Negative for chest pain, palpitations and leg swelling.  Gastrointestinal: Negative for abdominal pain, heartburn and nausea.  Genitourinary: Negative for frequency.  Musculoskeletal: Negative for joint pain and myalgias.  Skin: Negative for itching and rash.  Neurological: Negative for dizziness, weakness and headaches.  Endo/Heme/Allergies: Does not bruise/bleed easily.  Psychiatric/Behavioral: Negative for depression. The patient is not nervous/anxious.      Objective:   Vitals:   02/07/19 1400  BP: 118/66  Pulse: 77  Temp: 98.2 F  (36.8 C)  TempSrc: Temporal  SpO2: 95%  Weight: 142 lb (64.4 kg)  Height: 5' 5.5" (1.664 m)     Physical Exam: General: Well-appearing, no acute distress HENT: [place], AT Eyes: EOMI, no scleral icterus Respiratory: Clear to auscultation bilaterally.  No crackles, wheezing or rales Cardiovascular: RRR, -M/R/G, no JVD GI: BS+, soft, nontender Extremities:-Edema,-tenderness Neuro: AAO x4, CNII-XII grossly intact Skin: Intact, no rashes or bruising Psych: Normal mood, normal affect  Data Reviewed:  Imaging: CT chest 03/26/2018-status post left upper lobe lobectomy with left mediastinal shift.  Stable right upper lobe and left upper lobe nodules.  Very severe centrilobular emphysema in the background  PFT: 11/28/2014 FVC 3.68 (109%) FEV1 1.87 (72%)  Ratio 51 TLC 121% DLCO 43% Interpretation: Mild obstructive defect with moderately reduced DLCO.  No significant bronchodilator response.  No air trapping.  Imaging, labs and tests noted above have been reviewed independently by me.    Assessment & Plan:   Discussion: 69 year old female with COPD, history of stage Ia lung cancer status post left upper lobe lobectomy 2005, right upper lung nodule, anxiety/depression, GERD who presents for new consultation for COPD management.  She has had worsening dyspnea, cough and chest congestion the last 9 months. Recent cardiac work-up including stress on 01/21/2019 was negative.  Lung function can decline with time and both smokers and non-smokers.  Will optimize inhalers by increasing ICS/LABA and repeat PFTs  Mild COPD/Emphysema INCREASE Breo to 200-23mcg ONE puff ONCE a day We will consult our Pulmonary pharmacist for affordable inhaler options to avoid the donut hole. After she messages back, we will order an appropriate inhaler for you We will arrange for Pulmonary function tests prior to your next visit  History of stage Ia lung cancer status post left upper lobe lobectomy in 2005 Right upper  lobe nodule status post nondiagnostic navigational bronchoscopy in 2016 Recent nodules have been stable Due for annual low-dose CT in February 2021.  Managed by CT surgery with Dr. Roxan Hockey  Follow-up with me or NP in 2-3 months after PFTs  Health Maintenance Immunization History  Administered Date(s) Administered  . Influenza Split 11/04/2014  . Influenza, High Dose Seasonal PF 10/25/2016, 10/19/2017  . Influenza,inj,Quad PF,6+ Mos 10/30/2015  . Pneumococcal Conjugate-13 02/22/2015  . Tdap 02/04/2011  . Zoster 02/04/2011  . Zoster Recombinat (Shingrix) 01/17/2018, 04/05/2018   CT Lung Screen - not qualified. Hx lung cancer  Orders Placed This Encounter  Procedures  . Pulmonary function test    Standing Status:   Future    Standing Expiration Date:   02/07/2020    Order Specific Question:   Where should this test be performed?    Answer:   [facility]    Order Specific Question:   Full PFT: includes the following: basic spirometry, spirometry pre & post bronchodilator, diffusion capacity (DLCO), lung volumes    Answer:   Full PFT    Order Specific Question:   MIP/MEP    Answer:   No    Order Specific Question:   6 minute walk    Answer:   No    Order Specific Question:   ABG    Answer:   No    Order Specific Question:   Diffusion capacity (DLCO)    Answer:   Yes    Order Specific Question:   Lung volumes    Answer:   Yes    Order Specific Question:   Methacholine challenge    Answer:   No   Meds ordered this encounter  Medications  . fluticasone furoate-vilanterol (BREO ELLIPTA) 200-25 MCG/INH AEPB    Sig: Inhale 1 puff into the lungs daily.    Dispense:  2 each    Refill:  0    Order Specific Question:   Lot Number?    Answer:   [id number]    Order Specific Question:   Expiration Date?    Answer:   04/04/2019    Order Specific Question:   Manufacturer?    Answer:   [employer] [[id number]]    Order Specific Question:   Quantity    Answer:   2    Return in about 3  months (around 05/08/2019).   I have  spent 45-minutes charting, coordinating care and discussing medical diagnosis, plan and counseling with the patient/family as noted above.  Tou Hayner Rodman Pickle, MD [facility] 02/07/2019 10:47 AM  Office Number [phone]  ADDENDUM: After discussion with pharmacy tech and patient, will switch to Wixela 100-35mcg ONE puff TWICE a day. Advised patient to discuss with local pharmacy inhaler technique if needed.

## 2019-02-07 NOTE — Addendum Note (Signed)
Addended by: Rodman Pickle on: 02/07/2019 05:29 PM   Modules accepted: Orders

## 2019-02-07 NOTE — Patient Instructions (Signed)
Mild COPD/Emphysema INCREASE Breo to 200-64mcg ONE puff ONCE a day We will consult our Pulmonary pharmacist for affordable inhaler options to avoid the donut hole. After she messages back, we will order an appropriate inhaler for you We will arrange for Pulmonary function tests prior to your next visit  Follow-up with me or NP in 2-3 months after PFTs

## 2019-02-15 ENCOUNTER — Other Ambulatory Visit: Payer: Self-pay | Admitting: Thoracic Surgery (Cardiothoracic Vascular Surgery)

## 2019-02-15 DIAGNOSIS — R918 Other nonspecific abnormal finding of lung field: Secondary | ICD-10-CM

## 2019-03-24 ENCOUNTER — Other Ambulatory Visit: Payer: Medicare Other

## 2019-03-26 ENCOUNTER — Other Ambulatory Visit (HOSPITAL_COMMUNITY): Payer: Medicare Other

## 2019-03-28 ENCOUNTER — Ambulatory Visit
Admission: RE | Admit: 2019-03-28 | Discharge: 2019-03-28 | Disposition: A | Discharge status: Home / Self Care | Payer: Medicare Other | Source: Ambulatory Visit | Attending: Thoracic Surgery (Cardiothoracic Vascular Surgery) | Admitting: Thoracic Surgery (Cardiothoracic Vascular Surgery)

## 2019-03-28 DIAGNOSIS — R918 Other nonspecific abnormal finding of lung field: Secondary | ICD-10-CM

## 2019-03-29 ENCOUNTER — Encounter: Payer: Self-pay | Admitting: Thoracic Surgery (Cardiothoracic Vascular Surgery)

## 2019-03-29 ENCOUNTER — Other Ambulatory Visit: Payer: Self-pay

## 2019-03-29 ENCOUNTER — Ambulatory Visit: Payer: Medicare Other | Admitting: Thoracic Surgery (Cardiothoracic Vascular Surgery)

## 2019-03-29 VITALS — BP 102/61 | HR 75 | Temp 97.5°F | Resp 20 | Ht 65.5 in | Wt 144.0 lb

## 2019-03-29 DIAGNOSIS — R918 Other nonspecific abnormal finding of lung field: Secondary | ICD-10-CM | POA: Diagnosis not present

## 2019-03-29 DIAGNOSIS — Z85118 Personal history of other malignant neoplasm of bronchus and lung: Secondary | ICD-10-CM | POA: Diagnosis not present

## 2019-03-29 NOTE — Progress Notes (Signed)
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  She had a left upper lobectomy for stage Ia non-small cell carcinoma in 2005.  In 2016 she was found to have a new right upper lobe lung nodule.  Navigational bronchoscopy showed inflammation and she has been followed radiographically.  I last saw her in the office in February 2020.  She complained of a frequent cough.  That has not resolved.  She was sick in March and think she may have had Covid.  She says that her exercise tolerance has not recovered since she was ill in March.  Past Medical History:  Diagnosis Date   Anxiety    Arthritis 2018   Bulging lumbar disc 3846   Complication of anesthesia    "low blood pressure after retinal detachment surgery"   COPD (chronic obstructive pulmonary disease) ([place])    Degenerative disc disease, lumbar 2018   Depression    Emphysema of lung ([facility])    History of bronchitis    History of pneumonia    "multiple times"   Hyperlipidemia    Hyperlipidemia    Kidney stones 02/2016   lung ca 05/2003   Lung cancer ([place])    s/p LUL lobectomy   Osteoarthritis    spine, knees, hands   PONV (postoperative nausea and vomiting)    pt "could not eat during hospital stay"   Reflux esophagitis    Rhinitis    Scoliosis 2018   Shortness of breath    Wears glasses     Current Outpatient Medications  Medication Sig Dispense Refill   acetaminophen-codeine (TYLENOL #3) 300-30 MG tablet Take 1 tablet by mouth every 8 (eight) hours as needed.     baclofen (LIORESAL) 10 MG tablet TAKE 1 TABLET (10MG ) BY MOUTH 2 TIMES A DAY AS  NEEDED     BELSOMRA 10 MG TABS 2 tablets.      celecoxib (CELEBREX) 200 MG capsule Take 200 mg by mouth daily.     CYMBALTA 60 MG capsule Take 120 mg by mouth daily.      Fluticasone-Salmeterol (WIXELA INHUB) 100-50 MCG/DOSE AEPB Inhale 1 puff into the lungs 2 (two) times daily. 60 each 6   Glucosamine 500 MG CAPS Take 500 mg by mouth 3 (three) times daily.     rosuvastatin (CRESTOR) 5 MG tablet Take 5 mg by mouth 4 (four) times a week.     traMADol (ULTRAM) 50 MG tablet TAKE 1 TO 2 TABLETS BY MOUTH 3 TIMES A DAY     Vitamin D, Ergocalciferol, (DRISDOL) 1.25 MG (50000 UT) CAPS capsule Take 1 capsule (50,000 Units total) by mouth every 14 (fourteen) days. 6 capsule 4   No current facility-administered medications for this visit.    Physical Exam BP 102/61 (BP Location: Right Arm, Patient Position: Sitting, Cuff Size: Normal)    Pulse 75    Temp (!) 97.5 F (36.4 C) (Temporal)    Resp 20    Ht 5' 5.5" (1.664 m)    Wt 144 lb (65.3 kg)    LMP 09/26/2013 (Exact  Date)    SpO2 90% Comment: RA   BMI 23.25 kg/m  69 year old woman in no acute distress Alert and oriented x3 with no focal deficits Lungs absent left base, diminished throughout Cardiac regular rate and rhythm normal S1 and S2  Diagnostic Tests: CT CHEST WITHOUT CONTRAST  TECHNIQUE: Multidetector CT imaging of the chest was performed following the standard protocol without IV contrast.  COMPARISON:  03/26/2018  FINDINGS: Cardiovascular: The heart size is normal. No substantial pericardial effusion. Coronary artery calcification is evident. Atherosclerotic calcification is noted in the wall of the thoracic aorta.  Mediastinum/Nodes: No mediastinal lymphadenopathy. No evidence for gross hilar lymphadenopathy although assessment is limited by the lack of intravenous contrast on today's study. The esophagus has normal imaging features. There is no axillary lymphadenopathy.  Lungs/Pleura: Centrilobular and  paraseptal emphysema evident. Calcified granuloma in the right lung apex is stable. Architectural distortion and scarring in the right upper lobe is similar to the prior exam stable right lower lobe calcified granuloma. No new or progressive nodularity in the right lung.  Volume loss left hemithorax consistent with prior left upper lobectomy.Calcified granulomata also noted in the left lung. The anterior left upper lung elongated nodular opacity identified previously is stable on today's study at 1.6 x 0.5 cm (57/8) compared to 1.7 x 0.4 cm previously. The subpleural scarring towards the left base is stable.  No focal airspace consolidation.  No pleural effusion.  Upper Abdomen: Unremarkable.  Musculoskeletal: No worrisome lytic or sclerotic osseous abnormality.  IMPRESSION: 1. Stable exam. No new or progressive findings. Previously described bilateral pulmonary nodules are stable in the 1 year interval since 03/26/2018 and now are considered stable since 03/24/2017, most suggestive of benign etiology. 2. Status post left upper lobectomy. 3. Aortic Atherosclerosis (ICD10-I70.0) and Emphysema (ICD10-J43.9).   Electronically Signed   By: Misty Stanley M.D.   On: 03/28/2019 15:28 I personally reviewed the CT images and concur with the findings noted above  Impression: Tammy Eaton is a 69 year old woman with a history of remote tobacco abuse, severe COPD, lung cancer, hyperlipidemia, degenerative disc disease, arthritis, anxiety and depression.  Lung nodules-history of left upper lobectomy in 2005.  New lung nodules noted in 2016.  She has been followed since that time.  All nodules are stable on today's CT for 2 years consistent with benign etiology.  She will follow-up with Dr. Loanne Drilling regarding her chronic cough.  Plan: Return in 1 year with CT chest  [name], MD [facility] [phone]

## 2019-04-08 ENCOUNTER — Other Ambulatory Visit (HOSPITAL_COMMUNITY)
Admission: RE | Admit: 2019-04-08 | Discharge: 2019-04-08 | Disposition: A | Discharge status: Home / Self Care | Payer: Medicare Other | Source: Ambulatory Visit | Attending: Pulmonary Disease | Admitting: Pulmonary Disease

## 2019-04-08 DIAGNOSIS — Z20822 Contact with and (suspected) exposure to covid-19: Secondary | ICD-10-CM | POA: Diagnosis present

## 2019-04-08 LAB — SARS CORONAVIRUS 2 (TAT 6-24 HRS): SARS Coronavirus 2: NEGATIVE

## 2019-04-11 ENCOUNTER — Ambulatory Visit: Payer: Medicare Other | Admitting: Pulmonary Disease

## 2019-04-11 ENCOUNTER — Ambulatory Visit (INDEPENDENT_AMBULATORY_CARE_PROVIDER_SITE_OTHER): Payer: Medicare Other | Admitting: Pulmonary Disease

## 2019-04-11 ENCOUNTER — Encounter: Payer: Self-pay | Admitting: Pulmonary Disease

## 2019-04-11 ENCOUNTER — Other Ambulatory Visit: Payer: Self-pay

## 2019-04-11 VITALS — BP 114/62 | HR 98 | Temp 97.7°F | Ht 66.0 in | Wt 144.0 lb

## 2019-04-11 DIAGNOSIS — J449 Chronic obstructive pulmonary disease, unspecified: Secondary | ICD-10-CM | POA: Diagnosis not present

## 2019-04-11 DIAGNOSIS — R05 Cough: Secondary | ICD-10-CM

## 2019-04-11 DIAGNOSIS — R053 Chronic cough: Secondary | ICD-10-CM | POA: Insufficient documentation

## 2019-04-11 LAB — PULMONARY FUNCTION TEST
DL/VA % pred: 56 %
DL/VA: 2.29 ml/min/mmHg/L
DLCO cor % pred: 49 %
DLCO cor: 10.39 ml/min/mmHg
DLCO unc % pred: 49 %
DLCO unc: 10.39 ml/min/mmHg
FEF 25-75 Post: 0.85 L/sec
FEF 25-75 Pre: 0.72 L/sec
FEF2575-%Change-Post: 17 %
FEF2575-%Pred-Post: 41 %
FEF2575-%Pred-Pre: 35 %
FEV1-%Change-Post: 6 %
FEV1-%Pred-Post: 66 %
FEV1-%Pred-Pre: 62 %
FEV1-Post: 1.65 L
FEV1-Pre: 1.56 L
FEV1FVC-%Change-Post: 1 %
FEV1FVC-%Pred-Pre: 69 %
FEV6-%Change-Post: 5 %
FEV6-%Pred-Post: 95 %
FEV6-%Pred-Pre: 90 %
FEV6-Post: 3 L
FEV6-Pre: 2.83 L
FEV6FVC-%Change-Post: 0 %
FEV6FVC-%Pred-Post: 102 %
FEV6FVC-%Pred-Pre: 101 %
FVC-%Change-Post: 4 %
FVC-%Pred-Post: 93 %
FVC-%Pred-Pre: 89 %
FVC-Post: 3.06 L
FVC-Pre: 2.93 L
Post FEV1/FVC ratio: 54 %
Post FEV6/FVC ratio: 98 %
Pre FEV1/FVC ratio: 53 %
Pre FEV6/FVC Ratio: 98 %
RV % pred: 136 %
RV: 3.09 L
TLC % pred: 116 %
TLC: 6.23 L

## 2019-04-11 MED ORDER — SPIRIVA RESPIMAT 2.5 MCG/ACT IN AERS: 2.0000 | INHALATION_SPRAY | Freq: Every day | RESPIRATORY_TRACT | 0 refills | Status: DC

## 2019-04-11 NOTE — Patient Instructions (Addendum)
Moderate COPD with emphysema and bronchitis CONTINUE Wixela 100-50 ONE puff TWICE a day START Spiriva 2.5 mcg TWO puffs ONCE a day  Follow-up in 6 months

## 2019-04-11 NOTE — Progress Notes (Signed)
Subjective:   PATIENT ID: Tammy Eaton GENDER: female DOB: [birth year], MRN: [id number]   HPI  Chief Complaint  Patient presents with  . Follow-up    pft, cough   Reason for Visit: Follow-up shortness of breath  [name] is a 69 year old female with COPD, history of lung nodule, hx of lung cancer in 2004, anxiety/depression and GERD who presents for follow-up.   Synopsis: Diagnosed with COPD 15 years ago. She was only started [place] five years ago. Denies COPD exacerbations requiring ED or hospitalizations. She rarely gets treated for bronchitis or exacerbations as an outpatient. She quit smoking 2010.   Interval On our last visit on 02/07/19, she was previously on Breo and has switched to [facility] which is much cheaper. Has been compliant with her inhalers. Rarely uses her rescue inhalers. Her cough is unchanged with white/clear sputum production. Tried nasocort and flonase however causes headaches. On claritin and zyrtect. Uses saline rinses. No ED/urgent care/hospitalization related to COPD since our last visit. She has had a recent CT chest which was reviewed with Dr. Roxan Hockey on 03/29/19 and deemed stable x 2 years with likely benign nodules; he plans to see her again in one year.  Social History: Former smoker. 80 pack years. Quit in 2010.  I have personally reviewed patient's past medical/family/social history, allergies, current medications.  Past Medical History:  Diagnosis Date  . Anxiety   . Arthritis 2018  . Bulging lumbar disc 2018  . Complication of anesthesia    "low blood pressure after retinal detachment surgery"  . COPD (chronic obstructive pulmonary disease) ([place])   . Degenerative disc disease, lumbar 2018  . Depression   . Emphysema of lung ([place])   . History of bronchitis   . History of pneumonia    "multiple times"  . Hyperlipidemia   . Hyperlipidemia   . Kidney stones 02/2016  . lung ca 05/2003  . Lung cancer ([place])    s/p LUL lobectomy   . Osteoarthritis    spine, knees, hands  . PONV (postoperative nausea and vomiting)    pt "could not eat during hospital stay"  . Reflux esophagitis   . Rhinitis   . Scoliosis 2018  . Shortness of breath   . Wears glasses       Outpatient Medications Prior to Visit  Medication Sig Dispense Refill  . acetaminophen-codeine (TYLENOL #3) 300-30 MG tablet Take 1 tablet by mouth every 8 (eight) hours as needed.    . baclofen (LIORESAL) 10 MG tablet TAKE 1 TABLET (10MG ) BY MOUTH 2 TIMES A DAY AS NEEDED    . BELSOMRA 10 MG TABS 2 tablets.     . celecoxib (CELEBREX) 200 MG capsule Take 200 mg by mouth daily.    . CYMBALTA 60 MG capsule Take 120 mg by mouth daily.     . Fluticasone-Salmeterol (WIXELA INHUB) 100-50 MCG/DOSE AEPB Inhale 1 puff into the lungs 2 (two) times daily. 60 each 6  . Glucosamine 500 MG CAPS Take 500 mg by mouth 3 (three) times daily.    . rosuvastatin (CRESTOR) 5 MG tablet Take 5 mg by mouth 4 (four) times a week.    . traMADol (ULTRAM) 50 MG tablet TAKE 1 TO 2 TABLETS BY MOUTH 3 TIMES A DAY    . Vitamin D, Ergocalciferol, (DRISDOL) 1.25 MG (50000 UT) CAPS capsule Take 1 capsule (50,000 Units total) by mouth every 14 (fourteen) days. 6 capsule 4   No facility-administered medications  prior to visit.    Review of Systems  Constitutional: Negative for chills, diaphoresis, fever, malaise/fatigue and weight loss.  HENT: Negative for congestion.   Respiratory: Positive for cough and sputum production. Negative for hemoptysis, shortness of breath and wheezing.   Cardiovascular: Negative for chest pain, palpitations and leg swelling.     Objective:   Vitals:   04/11/19 1156  BP: 114/62  Pulse: 98  Temp: 97.7 F (36.5 C)  TempSrc: Temporal  SpO2: 91%  Weight: 144 lb (65.3 kg)  Height: 5\' 6"  (1.676 m)   SpO2: 91 % O2 Device: None (Room air)  Physical Exam: General: Well-appearing, no acute distress HENT: [place], AT Eyes: EOMI, no scleral icterus Respiratory:  Clear to auscultation bilaterally.  No crackles, wheezing or rales Cardiovascular: RRR, -M/R/G, no JVD GI: BS+, soft, nontender Extremities:-Edema,-tenderness Neuro: AAO x4, CNII-XII grossly intact Skin: Intact, no rashes or bruising Psych: Normal mood, normal affect  Data Reviewed:  Imaging: CT chest 03/26/2018-status post left upper lobe lobectomy with left mediastinal shift.  Stable right upper lobe and left upper lobe nodules.  Very severe centrilobular emphysema in the background  CT 03/28/19 - Unchanged compared to 03/26/18 exam with stable nodules.   PFT: 11/28/2014 FVC 3.68 (109%) FEV1 1.87 (72%) Ratio 51 TLC 121% DLCO 43% Interpretation: Mild obstructive defect with moderately reduced DLCO.  No significant bronchodilator response.  No air trapping.  04/11/19 FVC 3.06 (93%) FEV1 1.65 (66%) Ratio 53  TLC 116% DLCO 49% Interpretation: Moderate obstructive defect with air trapping and reduced DLCO consistent with emphysema. Worsened air obstruction compared to 2016.  Imaging, labs and tests noted above have been reviewed independently by me.    Assessment & Plan:   Discussion: 69 year old female with COPD, history of lung nodule, hx of lung cancer in 2004, anxiety/depression and GERD who presents for follow-up. On our last visit, she has had worsening dyspnea, cough and chest congestion the last 9 months. Recent cardiac work-up including stress on 01/21/2019 was negative. Continues to have unchanged cough. We had increased her Memory Dance however due to cost this was changed to [employer]. During the change she remained on low-dose ICS. We reviewed PFTs together.  Moderate COPD with emphysema and bronchitis  - not improving CONTINUE Wixela 100-50 ONE puff TWICE a day START Spiriva 2.5 mcg TWO puffs ONCE a day  History of stage Ia lung cancer status post left upper lobe lobectomy in 2005 Right upper lobe nodule status post nondiagnostic navigational bronchoscopy in 2016 Recent nodules have  been stable x 2 years. Likely benign Following CT surgery with Dr. Roxan Hockey  Health Maintenance Immunization History  Administered Date(s) Administered  . Influenza Split 11/04/2014  . Influenza, High Dose Seasonal PF 10/25/2016, 10/19/2017, 09/23/2018  . Influenza,inj,Quad PF,6+ Mos 10/30/2015  . PFIZER SARS-COV-2 Vaccination 03/11/2019, 03/31/2019  . Pneumococcal Conjugate-13 02/22/2015  . Tdap 02/04/2011  . Zoster 02/04/2011  . Zoster Recombinat (Shingrix) 01/17/2018, 04/12/2018   CT Lung Screen - not qualified. Hx lung cancer  No orders of the defined types were placed in this encounter.  Meds ordered this encounter  Medications  . Tiotropium Bromide Monohydrate (SPIRIVA RESPIMAT) 2.5 MCG/ACT AERS    Sig: Inhale 2 puffs into the lungs daily.    Dispense:  4 g    Refill:  0   Return in about 6 months (around 10/12/2019).   [place], MD [facility] 04/11/2019 12:38 PM  Office Number [phone]

## 2019-04-11 NOTE — Progress Notes (Signed)
Full PFT performed today. °

## 2019-07-08 ENCOUNTER — Other Ambulatory Visit: Payer: Self-pay | Admitting: Obstetrics & Gynecology

## 2019-07-08 DIAGNOSIS — Z1231 Encounter for screening mammogram for malignant neoplasm of breast: Secondary | ICD-10-CM

## 2019-08-05 ENCOUNTER — Other Ambulatory Visit: Payer: Self-pay | Admitting: Pulmonary Disease

## 2019-08-22 ENCOUNTER — Ambulatory Visit
Admission: RE | Admit: 2019-08-22 | Discharge: 2019-08-22 | Disposition: A | Discharge status: Home / Self Care | Payer: Medicare Other | Source: Ambulatory Visit | Attending: Obstetrics & Gynecology | Admitting: Obstetrics & Gynecology

## 2019-08-22 ENCOUNTER — Other Ambulatory Visit: Payer: Self-pay

## 2019-08-22 DIAGNOSIS — Z1231 Encounter for screening mammogram for malignant neoplasm of breast: Secondary | ICD-10-CM

## 2019-10-12 ENCOUNTER — Ambulatory Visit: Payer: Medicare Other | Admitting: Pulmonary Disease

## 2019-10-19 ENCOUNTER — Other Ambulatory Visit: Payer: Self-pay | Admitting: Obstetrics & Gynecology

## 2019-10-19 DIAGNOSIS — E559 Vitamin D deficiency, unspecified: Secondary | ICD-10-CM

## 2019-10-19 NOTE — Telephone Encounter (Signed)
Medication refill request: Vit D 50000iu Last AEX:  08/20/18 Next AEX: 10/27/19 Last MMG (if hormonal medication request): NA Refill authorized: 6/4

## 2019-10-24 NOTE — Progress Notes (Signed)
69 y.o. G57P2002 Married White or Caucasian female here for breast and pelvic exam.  I am also following her for vaginal prolapse and family hx of ovarian cancer in her mother.  Feels her prolapse is worse.  At times, she feels like there is something right at the vaginal opening but at other times it is much better (like today).  Has pressure when this occurs.  Doesn't really have issues with bladder function.  She does not typically leak urine.    I also follow her BMDs.  Last one was 2018.  This was normal and stable from 2012.  We are planning repeat in 2 years.  H/o small cell ca of lung.  Lobectomy in 2005.  Followed by Dr. Irene Limbo, oncology, and has been released.  Had full evaluation with Dr. Harrington Challenger due to LE edema.  This has now improved and Dr. Harrington Challenger has essentially released her.    Denies vaginal bleeding.  Patient's last menstrual period was 09/26/2013 (exact date).          Sexually active: No.  H/O STD:  no  Health Maintenance: PCP:  Sela Hilding  Last wellness appt was last year.  Did blood work at that appt:  no Vaccines are up to date:  yes Colonoscopy:  06-07-14 f/u 60yrs.  She will follow up with [facility] about this.  MMG:  08-22-2019 category c density birads 1:neg BMD:  06-10-16 neg Last pap smear:  02-20-14 neg HPV HR neg, 04-07-17 neg   H/o abnormal pap smear: no     reports that she quit smoking about 11 years ago. She started smoking about 56 years ago. She has a 90.00 pack-year smoking history. She has never used smokeless tobacco. She reports current alcohol use. She reports that she does not use drugs.  Past Medical History:  Diagnosis Date   Anxiety    Arthritis 2018   Bulging lumbar disc 5188   Complication of anesthesia    "low blood pressure after retinal detachment surgery"   COPD (chronic obstructive pulmonary disease) ([place])    Degenerative disc disease, lumbar 2018   Depression    Emphysema of lung ([facility])    History of bronchitis    History of  pneumonia    "multiple times"   Hyperlipidemia    Hyperlipidemia    Kidney stones 02/2016   lung ca 05/2003   Lung cancer ([place])    s/p LUL lobectomy   Osteoarthritis    spine, knees, hands   PONV (postoperative nausea and vomiting)    pt "could not eat during hospital stay"   Reflux esophagitis    Rhinitis    Scoliosis 2018   Shortness of breath    Wears glasses     Past Surgical History:  Procedure Laterality Date   BREAST EXCISIONAL BIOPSY     BREAST LUMPECTOMY WITH NEEDLE LOCALIZATION Right 04/11/2013   Procedure: BREAST LUMPECTOMY WITH NEEDLE LOCALIZATION;  Surgeon: Odis Hollingshead, MD;  Location: [facility];  Service: General;  Laterality: Right;   BREAST SURGERY  1992   lt br bx   CHOLECYSTECTOMY  11/11/2011   Procedure: LAPAROSCOPIC CHOLECYSTECTOMY WITH INTRAOPERATIVE CHOLANGIOGRAM;  Surgeon: Odis Hollingshead, MD;  Location: [facility];  Service: General;  Laterality: N/A;   COLONOSCOPY     CYSTOSCOPY  12/10   for evaluation of hematuria   DILATION AND CURETTAGE OF UTERUS     ESOPHAGOGASTRODUODENOSCOPY     EYE SURGERY     catataracts-both  KNEE SURGERY     rt knee torn cartilage   LUNG LOBECTOMY Left 4/05   RETINAL DETACHMENT SURGERY  2011   left; x2   SINUS SURGERY WITH INSTATRAK     TUBAL LIGATION     VIDEO BRONCHOSCOPY WITH ENDOBRONCHIAL NAVIGATION N/A 01/15/2015   Procedure: VIDEO BRONCHOSCOPY WITH ENDOBRONCHIAL NAVIGATION;  Surgeon: [name], MD;  Location: [facility];  Service: Thoracic;  Laterality: N/A;   VOCAL CORD LATERALIZATION, ENDOSCOPIC APPROACH W/ MLB     nodule removed    Current Outpatient Medications  Medication Sig Dispense Refill   acetaminophen-codeine (TYLENOL #3) 300-30 MG tablet Take 1 tablet by mouth every 8 (eight) hours as needed.     ALPRAZolam (XANAX) 0.5 MG tablet Take 0.5 mg by mouth at bedtime as needed.     azelastine (ASTELIN) 0.1 % nasal spray Place 2 sprays into both nostrils  2 (two) times daily.     baclofen (LIORESAL) 10 MG tablet TAKE 1 TABLET (10MG ) BY MOUTH 2 TIMES A DAY AS NEEDED     celecoxib (CELEBREX) 200 MG capsule Take 200 mg by mouth daily.     clonazePAM (KLONOPIN) 0.5 MG tablet Take 0.5 mg by mouth 2 (two) times daily as needed for anxiety.     CYMBALTA 60 MG capsule Take 120 mg by mouth daily.      doxycycline (VIBRAMYCIN) 100 MG capsule Take 100 mg by mouth 2 (two) times daily.     fluticasone (FLONASE) 50 MCG/ACT nasal spray Place 2 sprays into both nostrils daily.     LINZESS 72 MCG capsule PLEASE SEE ATTACHED FOR DETAILED DIRECTIONS     rosuvastatin (CRESTOR) 5 MG tablet Take 5 mg by mouth 4 (four) times a week.     traMADol (ULTRAM) 50 MG tablet TAKE 1 TO 2 TABLETS BY MOUTH 3 TIMES A DAY     Vitamin D, Ergocalciferol, (DRISDOL) 1.25 MG (50000 UNIT) CAPS capsule TAKE 1 CAPSULE (50,000 UNITS TOTAL) BY MOUTH EVERY 14 (FOURTEEN) DAYS. 6 capsule 0   WIXELA INHUB 100-50 MCG/DOSE AEPB TAKE 1 PUFF BY MOUTH TWICE A DAY 180 each 0   Fluticasone-Umeclidin-Vilant (TRELEGY ELLIPTA) 200-62.5-25 MCG/INH AEPB Inhale 1 puff into the lungs daily. (Patient not taking: Reported on 10/27/2019) 28 each 0   predniSONE (DELTASONE) 10 MG tablet Take 4 tablets (40 mg total) by mouth daily with breakfast for 5 days. (Patient not taking: Reported on 10/27/2019) 20 tablet 0   No current facility-administered medications for this visit.    Family History  Problem Relation Age of Onset   Cancer Mother 89       ovarian cancer   Hypertension Mother    Diabetes Mother    Alcohol abuse Father     Review of Systems  Constitutional: Negative.   HENT: Negative.   Eyes: Negative.   Respiratory: Negative.   Cardiovascular: Negative.   Gastrointestinal: Negative.   Endocrine: Negative.   Genitourinary:       Prolapse  Musculoskeletal: Negative.   Skin: Negative.   Allergic/Immunologic: Negative.   Neurological: Negative.   Hematological: Negative.    Psychiatric/Behavioral: Negative.     Exam:   BP 110/78    Pulse 68    Resp 16    Ht 5' 4.75" (1.645 m)    Wt 148 lb (67.1 kg)    LMP 09/26/2013 (Exact Date)    BMI 24.82 kg/m   Height: 5' 4.75" (164.5 cm)  General appearance: alert, cooperative and appears stated age  Breasts: normal appearance, no masses or tenderness Abdomen: soft, non-tender; bowel sounds normal; no masses,  no organomegaly Lymph nodes: Cervical, supraclavicular, and axillary nodes normal.  No abnormal inguinal nodes palpated Neurologic: Grossly normal  Pelvic: External genitalia:  no lesions              Urethra:  normal appearing urethra with no masses, tenderness or lesions              Bartholins and Skenes: normal                 Vagina: normal appearing vagina with normal color and discharge, no lesions, no significant prolapse noted today              Cervix: no lesions              Pap taken: No. Bimanual Exam:  Uterus:  normal size, contour, position, consistency, mobility, non-tender               Adnexa: normal adnexa and no mass, fullness, tenderness               Rectovaginal: Confirms               Anus:  normal sphincter tone, no lesions  Chaperone, Terence Lux, CMA, was present for exam.  A:  Breast and Pelvic exam PMP, no HRT Family hx of ovarian cancer in her mother Concern for prolapse, uterine Vit D deficiency Coronary artery calcifications and LE edema, which is improved, followed by Dr. Harrington Challenger H/o lung cancer and pulmonary nodules, followed by Dr. Loanne Drilling  P:   Mammogram guidelines reviewed.  Doing yearly. Return for PUS.  Order placed. pap smear not indicated today Asked pt to call when feeling possible prolapse for me to see same day if possible.  If only uterine prolapse present, TLH/BSO discussed for treatment.  She would be completely in favor of having this done.  She will call with symptoms.   Vit D level obtained today Other blood work typically done with Dr. Dorthy Cooler Repeat  BMD 2022 -2023 return annually or prn  33 minutes of total time was spent for this patient encounter, including preparation, face-to-face counseling with the patient and coordination of care, and documentation of the encounter.

## 2019-10-26 ENCOUNTER — Encounter: Payer: Self-pay | Admitting: Pulmonary Disease

## 2019-10-26 ENCOUNTER — Other Ambulatory Visit: Payer: Self-pay

## 2019-10-26 ENCOUNTER — Ambulatory Visit: Payer: Medicare Other | Admitting: Pulmonary Disease

## 2019-10-26 VITALS — BP 104/70 | HR 95 | Temp 97.6°F | Ht 65.5 in | Wt 149.2 lb

## 2019-10-26 DIAGNOSIS — J441 Chronic obstructive pulmonary disease with (acute) exacerbation: Secondary | ICD-10-CM | POA: Diagnosis not present

## 2019-10-26 DIAGNOSIS — Z23 Encounter for immunization: Secondary | ICD-10-CM | POA: Diagnosis not present

## 2019-10-26 MED ORDER — PREDNISONE 10 MG PO TABS
40.0000 mg | ORAL_TABLET | Freq: Every day | ORAL | 0 refills | Status: AC
Start: 2019-10-26 — End: 2019-10-31

## 2019-10-26 MED ORDER — TRELEGY ELLIPTA 200-62.5-25 MCG/INH IN AEPB
1.0000 | INHALATION_SPRAY | Freq: Every day | RESPIRATORY_TRACT | 0 refills | Status: DC
Start: 2019-10-26 — End: 2019-12-02

## 2019-10-26 NOTE — Progress Notes (Signed)
Subjective:   PATIENT ID: Tammy Eaton GENDER: female DOB: [birth year], MRN: [id number]   HPI  Chief Complaint  Patient presents with  . Follow-up    coughing productive cream thick color since april 2020,    Reason for Visit: Follow-up  [name] is a 69 year old female with COPD, history of lung nodule, hx of lung cancer in 2004, anxiety/depression and GERD who presents for follow-up.   Synopsis: Diagnosed with COPD 15 years ago. She was only started bronchodilators [place] [employer]) five years ago. Denies COPD exacerbations requiring ED or hospitalizations. She rarely gets treated for bronchitis or exacerbations as an outpatient. She quit smoking 2010.   Since our last visit on 04/11/19, she is compliant with Wixela. She had been started on Spiriva but stopped due to thrush. At baseline, she continues to have unchanged productive cough with white sputum and shortness of breath with exertion. She has had worsening chest congestion recently and she is completing doxycycline for recent low-grade fever, sinus pressure and congestion. COVID test was negative.  Denies exacerbations requiring steroids and denies ED/urgent care visits related to COPD.    Social History: Former smoker. 80 pack years. Quit in 2010.  I have personally reviewed patient's past medical/family/social history/allergies/current medications.  Past Medical History:  Diagnosis Date  . Anxiety   . Arthritis 2018  . Bulging lumbar disc 2018  . Complication of anesthesia    "low blood pressure after retinal detachment surgery"  . COPD (chronic obstructive pulmonary disease) ([place])   . Degenerative disc disease, lumbar 2018  . Depression   . Emphysema of lung ([place])   . History of bronchitis   . History of pneumonia    "multiple times"  . Hyperlipidemia   . Hyperlipidemia   . Kidney stones 02/2016  . lung ca 05/2003  . Lung cancer ([place])    s/p LUL lobectomy  . Osteoarthritis    spine, knees, hands  . PONV  (postoperative nausea and vomiting)    pt "could not eat during hospital stay"  . Reflux esophagitis   . Rhinitis   . Scoliosis 2018  . Shortness of breath   . Wears glasses       Outpatient Medications Prior to Visit  Medication Sig Dispense Refill  . acetaminophen-codeine (TYLENOL #3) 300-30 MG tablet Take 1 tablet by mouth every 8 (eight) hours as needed.    . baclofen (LIORESAL) 10 MG tablet TAKE 1 TABLET (10MG ) BY MOUTH 2 TIMES A DAY AS NEEDED    . celecoxib (CELEBREX) 200 MG capsule Take 200 mg by mouth daily.    . CYMBALTA 60 MG capsule Take 120 mg by mouth daily.     Marland Kitchen doxycycline (VIBRAMYCIN) 100 MG capsule Take 100 mg by mouth 2 (two) times daily.    . fluticasone (FLONASE) 50 MCG/ACT nasal spray Place 2 sprays into both nostrils daily.    Marland Kitchen LINZESS 72 MCG capsule PLEASE SEE ATTACHED FOR DETAILED DIRECTIONS    . rosuvastatin (CRESTOR) 5 MG tablet Take 5 mg by mouth 4 (four) times a week.    . traMADol (ULTRAM) 50 MG tablet TAKE 1 TO 2 TABLETS BY MOUTH 3 TIMES A DAY    . Vitamin D, Ergocalciferol, (DRISDOL) 1.25 MG (50000 UNIT) CAPS capsule TAKE 1 CAPSULE (50,000 UNITS TOTAL) BY MOUTH EVERY 14 (FOURTEEN) DAYS. 6 capsule 0  . WIXELA INHUB 100-50 MCG/DOSE AEPB TAKE 1 PUFF BY MOUTH TWICE A DAY 180 each 0  . BELSOMRA  10 MG TABS 2 tablets.     . Glucosamine 500 MG CAPS Take 500 mg by mouth 3 (three) times daily.    . Tiotropium Bromide Monohydrate (SPIRIVA RESPIMAT) 2.5 MCG/ACT AERS Inhale 2 puffs into the lungs daily. 4 g 0   No facility-administered medications prior to visit.    Review of Systems  Constitutional: Negative for chills, diaphoresis, fever, malaise/fatigue and weight loss.  HENT: Negative for congestion.   Respiratory: Positive for cough and sputum production. Negative for hemoptysis, shortness of breath and wheezing.   Cardiovascular: Negative for chest pain, palpitations and leg swelling.     Objective:   Vitals:   10/26/19 1348  BP: 104/70  Pulse: 95    Temp: 97.6 F (36.4 C)  TempSrc: Temporal  SpO2: 94%  Weight: 149 lb 3.2 oz (67.7 kg)  Height: 5' 5.5" (1.664 m)   SpO2: 94 % O2 Device: None (Room air)  Physical Exam: General: Well-appearing, no acute distress HENT: [place], AT Eyes: EOMI, no scleral icterus Respiratory: Diminished bilaterally. Clear to auscultation bilaterally.  No crackles, wheezing or rales Cardiovascular: RRR, -M/R/G, no JVD GI: BS+, soft, nontender Extremities:-Edema,-tenderness Neuro: AAO x4, CNII-XII grossly intact Skin: Intact, no rashes or bruising Psych: Normal mood, normal affect  Data Reviewed:  Imaging: CT chest 03/26/2018-status post left upper lobe lobectomy with left mediastinal shift.  Stable right upper lobe and left upper lobe nodules.  Very severe centrilobular emphysema in the background  CT 03/28/19 - Unchanged compared to 03/26/18 exam with stable nodules.   PFT: 11/28/2014 FVC 3.68 (109%) FEV1 1.87 (72%) Ratio 51 TLC 121% DLCO 43% Interpretation: Mild obstructive defect with moderately reduced DLCO.  No significant bronchodilator response.  No air trapping.  04/11/19 FVC 3.06 (93%) FEV1 1.65 (66%) Ratio 53  TLC 116% DLCO 49% Interpretation: Moderate obstructive defect with air trapping and reduced DLCO consistent with emphysema. Worsened air obstruction compared to 2016.  Imaging, labs and tests noted above have been reviewed independently by me.    Assessment & Plan:   Discussion:  69 year old female with COPD, history of lung nodule, hx of lung cancer in 2004, anxiety/depression who presents for follow-up. Compliant with bronchodilators however continues to have bronchitis symptoms at baseline. Did not tolerate Spiriva. Currently in exacerbation with worsening sputum production.   Moderate COPD with emphysema and bronchitis, in exacerbation STOP Wixela 100-50 ONE puff TWICE a day START Trelegy 200-62.5-25 mcg ONE puff. Provided one month sample.  START Prednisone 40 mg x 5  days Complete doxycycline  History of stage Ia lung cancer status post left upper lobe lobectomy in 2005 Right upper lobe nodule status post nondiagnostic navigational bronchoscopy in 2016 Recent nodules have been stable x 2 years. Likely benign Following CT surgery with Dr. Roxan Hockey  Health Maintenance Immunization History  Administered Date(s) Administered  . Influenza Split 11/04/2014  . Influenza, High Dose Seasonal PF 10/25/2016, 10/19/2017, 09/23/2018  . Influenza,inj,Quad PF,6+ Mos 10/30/2015  . PFIZER SARS-COV-2 Vaccination 03/11/2019, 03/31/2019  . Pneumococcal Conjugate-13 02/22/2015  . Tdap 02/04/2011  . Zoster 02/04/2011  . Zoster Recombinat (Shingrix) 01/17/2018, 04/12/2018  Due for influenza  CT Lung Screen - not qualified. Hx lung cancer  No orders of the defined types were placed in this encounter.  Meds ordered this encounter  Medications  . predniSONE (DELTASONE) 10 MG tablet    Sig: Take 4 tablets (40 mg total) by mouth daily with breakfast for 5 days.    Dispense:  20 tablet  Refill:  0  . Fluticasone-Umeclidin-Vilant (TRELEGY ELLIPTA) 200-62.5-25 MCG/INH AEPB    Sig: Inhale 1 puff into the lungs daily.    Dispense:  28 each    Refill:  0   Return in about 4 weeks (around 11/23/2019).   [place], MD [facility] 10/26/2019 2:35 PM  Office Number [phone]

## 2019-10-26 NOTE — Patient Instructions (Signed)
Moderate COPD with emphysema and bronchitis, in exacerbation STOP Wixela 100-50 ONE puff TWICE a day START Trelegy 200-62.5-25 mcg ONE puff. Provided one month sample.  START Prednisone 40 mg x 5 days Complete doxycycline  Follow-up with me in 4-6 weeks

## 2019-10-27 ENCOUNTER — Ambulatory Visit (INDEPENDENT_AMBULATORY_CARE_PROVIDER_SITE_OTHER): Payer: Medicare Other | Admitting: Obstetrics & Gynecology

## 2019-10-27 ENCOUNTER — Encounter: Payer: Self-pay | Admitting: Obstetrics & Gynecology

## 2019-10-27 VITALS — BP 110/78 | HR 68 | Resp 16 | Ht 64.75 in | Wt 148.0 lb

## 2019-10-27 DIAGNOSIS — Z8041 Family history of malignant neoplasm of ovary: Secondary | ICD-10-CM | POA: Diagnosis not present

## 2019-10-27 DIAGNOSIS — E559 Vitamin D deficiency, unspecified: Secondary | ICD-10-CM

## 2019-10-27 DIAGNOSIS — Z01419 Encounter for gynecological examination (general) (routine) without abnormal findings: Secondary | ICD-10-CM

## 2019-10-28 ENCOUNTER — Encounter: Payer: Self-pay | Admitting: Obstetrics & Gynecology

## 2019-10-28 LAB — VITAMIN D 25 HYDROXY (VIT D DEFICIENCY, FRACTURES): Vit D, 25-Hydroxy: 68.9 ng/mL (ref 30.0–100.0)

## 2019-10-29 DIAGNOSIS — J441 Chronic obstructive pulmonary disease with (acute) exacerbation: Secondary | ICD-10-CM | POA: Insufficient documentation

## 2019-11-01 ENCOUNTER — Other Ambulatory Visit: Payer: Self-pay

## 2019-11-01 ENCOUNTER — Telehealth: Payer: Self-pay

## 2019-11-01 ENCOUNTER — Ambulatory Visit: Payer: Medicare Other | Admitting: Obstetrics & Gynecology

## 2019-11-01 ENCOUNTER — Encounter: Payer: Self-pay | Admitting: Obstetrics & Gynecology

## 2019-11-01 ENCOUNTER — Ambulatory Visit (INDEPENDENT_AMBULATORY_CARE_PROVIDER_SITE_OTHER): Payer: Medicare Other

## 2019-11-01 ENCOUNTER — Other Ambulatory Visit (HOSPITAL_COMMUNITY)
Admission: RE | Admit: 2019-11-01 | Discharge: 2019-11-01 | Disposition: A | Discharge status: Home / Self Care | Payer: Medicare Other | Source: Ambulatory Visit | Attending: Obstetrics & Gynecology | Admitting: Obstetrics & Gynecology

## 2019-11-01 VITALS — BP 112/76 | HR 68 | Resp 16 | Wt 146.0 lb

## 2019-11-01 DIAGNOSIS — Z8041 Family history of malignant neoplasm of ovary: Secondary | ICD-10-CM

## 2019-11-01 DIAGNOSIS — N814 Uterovaginal prolapse, unspecified: Secondary | ICD-10-CM

## 2019-11-01 DIAGNOSIS — Z124 Encounter for screening for malignant neoplasm of cervix: Secondary | ICD-10-CM | POA: Diagnosis present

## 2019-11-01 NOTE — Telephone Encounter (Signed)
Spoke with patient regarding benefits for recommended ultrasound. Patient acknowledges understanding of information presented. Patient scheduled appointment for 11/01/2019 at 0330PM with M. Edwinna Areola, MD. Encounter closed.

## 2019-11-01 NOTE — Progress Notes (Signed)
69 y.o. G90P2002 Married White or Caucasian female here for pelvic ultrasound due to family hx of ovarian cancer.  Pt has newly developed symptoms c/w prolapse and today she is feeling the bulge low in the vaginal as she has been feeling in the past.  Denies vagina bleeding.  Patient's last menstrual period was 09/26/2013 (exact date).  Contraception: BTL/PMP  Findings:  UTERUS: 6.1 x 4.8 x 2.2 cm, cystic area with solid debris in the posterior cervix measuring 19 x 10 mm, slightly larger than on previous scan with vascularity noted within the solid components EMS: 1.8 mm ADNEXA: Left ovary: 2.7 x 1.3 x 0.8 cm, atrophic       Right ovary: 2.8 x 1.5 x 1.1 cm, atrophic CUL DE SAC: No free fluid  Physical Exam Exam conducted with a chaperone present.  Constitutional:      Appearance: Normal appearance.  Abdominal:     General: Abdomen is flat.     Palpations: Abdomen is soft. There is no mass.     Tenderness: There is no abdominal tenderness.     Hernia: No hernia is present. There is no hernia in the left inguinal area or right inguinal area.  Genitourinary:    General: Normal vulva.     Labia:        Right: No rash, tenderness, lesion or injury.        Left: No rash, tenderness, lesion or injury.      Vagina: Normal.     Cervix: Normal.     Uterus: Normal.      Adnexa: Right adnexa normal and left adnexa normal.     Comments: Cervix present right at introitus but no associated cystocele or rectocele note, cervix appears normal and is smooth to touch without any atypical firmness/masses noted. Pap obtained. Lymphadenopathy:     Lower Body: No right inguinal adenopathy. No left inguinal adenopathy.  Neurological:     Mental Status: She is alert.    Discussion: Ultrasonographer supervised.  Findings reviewed with the patient including the cervical finding.  This was present last year, slightly smaller, and appeared to be a nabothian cyst.  Pap was obtained today.  With central  recurring prolapse, patient is desirous of surgical intervention and treatment.  She is having no increased urinary symptoms.  She is very desirous of having her ovaries removed due to her family history.  Patient did have lung cancer in 2005 with a lobectomy.  She has subsequently undergone a laparoscopic cholecystectomy without difficulty.  I will reach out to her pulmonologist about any presurgical clearance.  Assessment: Third-degree uterine prolapse New ultrasound finding of what appeared to be a nabothian cyst last year but now has some increased vascularity History of lung cancer  Plan: We will see if she needs pulmonary clearance Surgical planning will be initiated Pap smear pending  31 minutes spent with patient today reviewing ultrasound, comparing to last year, discussing cervical finding, discussing surgical intervention and possible preoperative clearance.

## 2019-11-02 LAB — CYTOLOGY - PAP: Diagnosis: NEGATIVE

## 2019-11-14 ENCOUNTER — Telehealth: Payer: Self-pay

## 2019-11-14 NOTE — Telephone Encounter (Signed)
Spoke with patient. Results and message given as seen below from Tammy Eaton. Patient would like to proceed with seeing Dr.Silva. Aware she will be contacted with next steps to plan surgery with Dr.Silva.  CC Dr.Silva for recommendations

## 2019-11-14 NOTE — Telephone Encounter (Signed)
-----   Message from Tammy Salon, MD sent at 11/13/2019 10:58 PM EDT ----- This is the pt with central prolapse.  Can you let her know her pap is normal?  Also, there is no OR time for me until November.  Because if my transition, she needs to see Dr. Quincy Simmonds or Dr. Sharyn Lull schroeder (urogynecologist) for this surgical repair.  Can you let me know this and I can either talk to Dr. Quincy Simmonds or put in the referral for Dr. Wannetta Sender.  Thanks.   02 pap recall.    CC:  Royal Hawthorn, CMA

## 2019-11-15 NOTE — Telephone Encounter (Signed)
Please schedule an appointment with me.

## 2019-11-16 ENCOUNTER — Encounter (HOSPITAL_COMMUNITY): Payer: Self-pay | Admitting: Urology

## 2019-11-16 ENCOUNTER — Inpatient Hospital Stay (HOSPITAL_COMMUNITY): Payer: Medicare Other | Admitting: Certified Registered Nurse Anesthetist

## 2019-11-16 ENCOUNTER — Inpatient Hospital Stay (HOSPITAL_COMMUNITY): Payer: Medicare Other

## 2019-11-16 ENCOUNTER — Other Ambulatory Visit: Payer: Self-pay | Admitting: Urology

## 2019-11-16 ENCOUNTER — Encounter (HOSPITAL_COMMUNITY)
Admission: RE | Disposition: A | Discharge status: Home / Self Care | Payer: Self-pay | Source: Other Acute Inpatient Hospital | Attending: Urology

## 2019-11-16 ENCOUNTER — Telehealth: Payer: Self-pay

## 2019-11-16 ENCOUNTER — Ambulatory Visit (HOSPITAL_COMMUNITY)
Admission: RE | Admit: 2019-11-16 | Discharge: 2019-11-16 | Disposition: A | Discharge status: Home / Self Care | Payer: Medicare Other | Source: Other Acute Inpatient Hospital | Attending: Urology | Admitting: Urology

## 2019-11-16 DIAGNOSIS — M1990 Unspecified osteoarthritis, unspecified site: Secondary | ICD-10-CM | POA: Insufficient documentation

## 2019-11-16 DIAGNOSIS — Z880 Allergy status to penicillin: Secondary | ICD-10-CM | POA: Insufficient documentation

## 2019-11-16 DIAGNOSIS — I358 Other nonrheumatic aortic valve disorders: Secondary | ICD-10-CM | POA: Insufficient documentation

## 2019-11-16 DIAGNOSIS — F419 Anxiety disorder, unspecified: Secondary | ICD-10-CM | POA: Insufficient documentation

## 2019-11-16 DIAGNOSIS — Z85118 Personal history of other malignant neoplasm of bronchus and lung: Secondary | ICD-10-CM | POA: Diagnosis not present

## 2019-11-16 DIAGNOSIS — M419 Scoliosis, unspecified: Secondary | ICD-10-CM | POA: Insufficient documentation

## 2019-11-16 DIAGNOSIS — E7800 Pure hypercholesterolemia, unspecified: Secondary | ICD-10-CM | POA: Diagnosis not present

## 2019-11-16 DIAGNOSIS — Z87891 Personal history of nicotine dependence: Secondary | ICD-10-CM | POA: Diagnosis not present

## 2019-11-16 DIAGNOSIS — Z79899 Other long term (current) drug therapy: Secondary | ICD-10-CM | POA: Diagnosis not present

## 2019-11-16 DIAGNOSIS — Z902 Acquired absence of lung [part of]: Secondary | ICD-10-CM | POA: Diagnosis not present

## 2019-11-16 DIAGNOSIS — Z9849 Cataract extraction status, unspecified eye: Secondary | ICD-10-CM | POA: Diagnosis not present

## 2019-11-16 DIAGNOSIS — F32A Depression, unspecified: Secondary | ICD-10-CM | POA: Insufficient documentation

## 2019-11-16 DIAGNOSIS — Z20822 Contact with and (suspected) exposure to covid-19: Secondary | ICD-10-CM | POA: Insufficient documentation

## 2019-11-16 DIAGNOSIS — N201 Calculus of ureter: Secondary | ICD-10-CM | POA: Insufficient documentation

## 2019-11-16 DIAGNOSIS — J449 Chronic obstructive pulmonary disease, unspecified: Secondary | ICD-10-CM | POA: Diagnosis not present

## 2019-11-16 DIAGNOSIS — Z9049 Acquired absence of other specified parts of digestive tract: Secondary | ICD-10-CM | POA: Diagnosis not present

## 2019-11-16 DIAGNOSIS — K219 Gastro-esophageal reflux disease without esophagitis: Secondary | ICD-10-CM | POA: Diagnosis not present

## 2019-11-16 DIAGNOSIS — N390 Urinary tract infection, site not specified: Secondary | ICD-10-CM | POA: Insufficient documentation

## 2019-11-16 DIAGNOSIS — Z91048 Other nonmedicinal substance allergy status: Secondary | ICD-10-CM | POA: Diagnosis not present

## 2019-11-16 DIAGNOSIS — Z881 Allergy status to other antibiotic agents status: Secondary | ICD-10-CM | POA: Insufficient documentation

## 2019-11-16 HISTORY — PX: CYSTOSCOPY W/ URETERAL STENT PLACEMENT: SHX1429

## 2019-11-16 LAB — BASIC METABOLIC PANEL
Anion gap: 10 (ref 5–15)
BUN: 19 mg/dL (ref 8–23)
CO2: 23 mmol/L (ref 22–32)
Calcium: 8.9 mg/dL (ref 8.9–10.3)
Chloride: 107 mmol/L (ref 98–111)
Creatinine, Ser: 0.73 mg/dL (ref 0.44–1.00)
GFR, Estimated: >60 mL/min (ref >60)
Glucose, Bld: 107 mg/dL — ABNORMAL HIGH (ref 70–99)
Potassium: 3.8 mmol/L (ref 3.5–5.1)
Sodium: 140 mmol/L (ref 135–145)

## 2019-11-16 LAB — CBC
HCT: 44.6 % (ref 36.0–46.0)
Hemoglobin: 14.5 g/dL (ref 12.0–15.0)
MCH: 28.8 pg (ref 26.0–34.0)
MCHC: 32.5 g/dL (ref 30.0–36.0)
MCV: 88.7 fL (ref 80.0–100.0)
Platelets: 243 10*3/uL (ref 150–400)
RBC: 5.03 MIL/uL (ref 3.87–5.11)
RDW: 14.7 % (ref 11.5–15.5)
WBC: 11.3 10*3/uL — ABNORMAL HIGH (ref 4.0–10.5)
nRBC: 0 % (ref 0.0–0.2)

## 2019-11-16 LAB — SARS CORONAVIRUS 2 BY RT PCR (HOSPITAL ORDER, PERFORMED IN ~~LOC~~ HOSPITAL LAB): SARS Coronavirus 2: NEGATIVE

## 2019-11-16 SURGERY — CYSTOSCOPY, FLEXIBLE, WITH STENT REPLACEMENT
Anesthesia: General | Laterality: Left

## 2019-11-16 MED ORDER — OXYCODONE HCL 5 MG PO TABS: 5.0000 mg | ORAL_TABLET | Freq: Once | ORAL | Status: DC | PRN

## 2019-11-16 MED ORDER — FENTANYL CITRATE (PF) 100 MCG/2ML IJ SOLN
INTRAMUSCULAR | Status: AC
  Filled 2019-11-16: qty 2

## 2019-11-16 MED ORDER — MIDAZOLAM HCL 5 MG/5ML IJ SOLN
INTRAMUSCULAR | Status: DC | PRN
  Administered 2019-11-16: 2 mg via INTRAVENOUS

## 2019-11-16 MED ORDER — LACTATED RINGERS IV SOLN: INTRAVENOUS | Status: DC

## 2019-11-16 MED ORDER — ORAL CARE MOUTH RINSE: 15.0000 mL | Freq: Once | OROMUCOSAL | Status: AC

## 2019-11-16 MED ORDER — ONDANSETRON HCL 4 MG/2ML IJ SOLN
INTRAMUSCULAR | Status: DC | PRN
  Administered 2019-11-16: 4 mg via INTRAVENOUS

## 2019-11-16 MED ORDER — OXYCODONE HCL 5 MG/5ML PO SOLN: 5.0000 mg | Freq: Once | ORAL | Status: DC | PRN

## 2019-11-16 MED ORDER — SODIUM CHLORIDE 0.9 % IR SOLN
Status: DC | PRN
  Administered 2019-11-16: 3000 mL via INTRAVESICAL

## 2019-11-16 MED ORDER — CIPROFLOXACIN IN D5W 400 MG/200ML IV SOLN
400.0000 mg | INTRAVENOUS | Status: AC
  Administered 2019-11-16: 400 mg via INTRAVENOUS
  Filled 2019-11-16: qty 200

## 2019-11-16 MED ORDER — ONDANSETRON HCL 4 MG/2ML IJ SOLN: 4.0000 mg | Freq: Once | INTRAMUSCULAR | Status: DC | PRN

## 2019-11-16 MED ORDER — LIDOCAINE 2% (20 MG/ML) 5 ML SYRINGE
INTRAMUSCULAR | Status: DC | PRN
  Administered 2019-11-16: 60 mg via INTRAVENOUS

## 2019-11-16 MED ORDER — PROPOFOL 10 MG/ML IV BOLUS
INTRAVENOUS | Status: DC | PRN
  Administered 2019-11-16: 150 mg via INTRAVENOUS

## 2019-11-16 MED ORDER — PROPOFOL 10 MG/ML IV BOLUS
INTRAVENOUS | Status: AC
  Filled 2019-11-16: qty 20

## 2019-11-16 MED ORDER — MIDAZOLAM HCL 2 MG/2ML IJ SOLN
INTRAMUSCULAR | Status: AC
  Filled 2019-11-16: qty 2

## 2019-11-16 MED ORDER — FENTANYL CITRATE (PF) 100 MCG/2ML IJ SOLN: 25.0000 ug | INTRAMUSCULAR | Status: DC | PRN

## 2019-11-16 MED ORDER — CHLORHEXIDINE GLUCONATE 0.12 % MT SOLN
15.0000 mL | Freq: Once | OROMUCOSAL | Status: AC
  Administered 2019-11-16: 15 mL via OROMUCOSAL

## 2019-11-16 MED ORDER — DEXAMETHASONE SODIUM PHOSPHATE 10 MG/ML IJ SOLN
INTRAMUSCULAR | Status: DC | PRN
  Administered 2019-11-16: 10 mg via INTRAVENOUS

## 2019-11-16 MED ORDER — FENTANYL CITRATE (PF) 100 MCG/2ML IJ SOLN
INTRAMUSCULAR | Status: DC | PRN
Start: 2019-11-16 — End: 2019-11-16
  Administered 2019-11-16: 100 ug via INTRAVENOUS
  Administered 2019-11-16: 50 ug via INTRAVENOUS

## 2019-11-16 MED ORDER — IOHEXOL 300 MG/ML  SOLN
INTRAMUSCULAR | Status: DC | PRN
  Administered 2019-11-16: 1 mL via URETHRAL

## 2019-11-16 SURGICAL SUPPLY — 13 items
BAG URO CATCHER STRL LF (MISCELLANEOUS) ×2 IMPLANT
CATH INTERMIT  6FR 70CM (CATHETERS) ×2 IMPLANT
CLOTH BEACON ORANGE TIMEOUT ST (SAFETY) ×2 IMPLANT
GLOVE BIOGEL M STRL SZ7.5 (GLOVE) ×6 IMPLANT
GOWN STRL REUS W/TWL LRG LVL3 (GOWN DISPOSABLE) ×4 IMPLANT
GUIDEWIRE STR DUAL SENSOR (WIRE) ×2 IMPLANT
GUIDEWIRE ZIPWRE .038 STRAIGHT (WIRE) IMPLANT
KIT TURNOVER KIT A (KITS) ×2 IMPLANT
MANIFOLD NEPTUNE II (INSTRUMENTS) ×2 IMPLANT
PACK CYSTO (CUSTOM PROCEDURE TRAY) ×2 IMPLANT
STENT URET 6FRX24 CONTOUR (STENTS) ×2 IMPLANT
TUBING CONNECTING 10 (TUBING) ×2 IMPLANT
TUBING UROLOGY SET (TUBING) IMPLANT

## 2019-11-16 NOTE — Op Note (Signed)
Preoperative diagnosis:  1. Left ureteral calculi 2. UTI  Postoperative diagnosis:  1. Left ureteral calculi  2. UTI  Procedure:  1. Cystoscopy 2. Left ureteral stent placement (6 x24 - no string) 3. Left retrograde pyelography with interpretation  Surgeon: Pryor Curia. M.D.  Anesthesia: General  Complications: None  Intraoperative findings: Left retrograde pyelography demonstrated multiple filling defects consistent with her stones.  EBL: Minimal  Specimens: None  Indication: Tammy Eaton is a 69 y.o. patient with left ureteral stones and recent intermittent fever. After reviewing the management options for treatment, he elected to proceed with the above surgical procedure(s). We have discussed the potential benefits and risks of the procedure, side effects of the proposed treatment, the likelihood of the patient achieving the goals of the procedure, and any potential problems that might occur during the procedure or recuperation. Informed consent has been obtained.  Description of procedure:  The patient was taken to the operating room and general anesthesia was induced.  The patient was placed in the dorsal lithotomy position, prepped and draped in the usual sterile fashion, and preoperative antibiotics were administered. A preoperative time-out was performed.   Cystourethroscopy was performed.  The patient's urethra was examined and was normal. The bladder was then systematically examined in its entirety. There was no evidence for any bladder tumors, stones, or other mucosal pathology.    Attention then turned to the left ureteral orifice and a ureteral catheter was used to intubate the ureteral orifice.  Omnipaque contrast was injected through the ureteral catheter and a retrograde pyelogram was performed with findings as dictated above.  A 0.38 sensor guidewire was then advanced up the left ureter into the renal pelvis under fluoroscopic guidance.  The wire  was then backloaded through the cystoscope and a ureteral stent was advance over the wire using Seldinger technique.  The stent was positioned appropriately under fluoroscopic and cystoscopic guidance.  The wire was then removed with an adequate stent curl noted in the renal pelvis as well as in the bladder.  The bladder was then emptied and the procedure ended.  The patient appeared to tolerate the procedure well and without complications.  The patient was able to be awakened and transferred to the recovery unit in satisfactory condition.    Pryor Curia MD

## 2019-11-16 NOTE — H&P (Signed)
Office Visit Report     11/16/2019   --------------------------------------------------------------------------------   Tammy Eaton  MRN: [id number]  DOB: 02/02/1951, 69 year old Female  SSN: [id number]   PRIMARY CARE:  Frann Rider, MD  REFERRING:  Georgette Dover, MD  PROVIDER:  Festus Aloe, M.D.  TREATING:  Daine Gravel, NP  LOCATION:  [facility] [phone]     --------------------------------------------------------------------------------   CC/HPI: cc: Gross hematuria, intermittent fevers   HPI: 69 year old female with a past medical history of COPD, lung cancer, and a cystocele presents with gross hematuria and intermittent fevers along with left lower quadrant pain for the past month. She has been on 2 separate antibiotics over the past month for fevers. It is fevers were treated by her primary care doctor who thought this was related to her COPD and pneumonia. Her fevers have ranged from 99-101.9. She has been treated with 2 separate antibiotics. She has not had a fever in the past few days. However, she just finished an antibiotic 5 days ago. She does endorse nausea. The lower flank pain has been present intermittently and has been severe at times. She presents today with gross hematuria that began again 2 days ago, dysuria, and left-sided abdominal pain. She believes that the gross hematuria was caused by her new COPD medication trellegy. She does have a remote history of nephrolithiasis. She has not seen a stone pass. She is nontoxic appearing. Her urine is concerning for bacteriuria, positive nitrites, and leukocytes.     ALLERGIES: Adhesive tape - Skin Rash Amoxicillin TABS - Other Reaction, thrush Septra DS TABS - Hives    MEDICATIONS: Alprazolam 0.5 mg tablet 1 tablet PO Daily  Belsomra 10 mg tablet 1 tablet PO Daily  Breo Ellipta 100 mcg-25 mcg/dose blister, with inhalation device 1 PO Daily  Coq-10  Cymbalta  Glucosamine Sulfate   Promethazine Hcl 25 mg tablet 1 tablet PO Q 8 H PRN  Rosuvastatin Calcium 5 mg tablet 1 tablet PO Daily     GU PSH: Catheterization For Collection Of Specimen, Single Patient, All Places Of Service - 2018 Catheterize For Residual - 2018 Cystoscopy - 12/23/2017 D&C Non-OB - 2007     NON-GU PSH: Back surgery Bmi<30 And >=22 Calc & Docu - 2020 Breast lumpectomy Cataract surgery Cholecystectomy (open) - 2014 Doc Meds Verified W/Pt Or Re - 2020 Eye Surgery (Unspecified) Knee Arthroscopy Pain Doc Pos And Plan - 2020 Partial Remove Lung - 2007 Remove Breast Lesion - 2007     GU PMH: Cystocele, midline - 2020 Gross hematuria - 12/08/2017, - 2017, Gross hematuria, - 2015 Renal calculus - 12/08/2017 Urinary Frequency - 12/08/2017 Weak Urinary Stream - 12/08/2017 Dorsalgia, Unspec (Worsening, Chronic), Ketorolac 30 mg IM today. Ketorolac 10 mg 1 po Q6 hrs prn. Instructed to contact office if she has worsening/persist pain - may need CT urogram. Instructed to alternate ice/heat for back pain. No overt hydronephrosis noted on RUS. If nausea persist recommend she f/u w/PCP - 2018 Ureteral calculus - 2018, - 2018, - 2018 Hemorrhagic cystitis, Acute cystitis with hematuria - 2015 Acute Cystitis/UTI, Acute cystitis without hematuria - 2014 Dysuria, Dysuria - 2014 Oth GU systems Signs/Symptoms, Bladder pain - 2014 Splitting of urinary stream, Urinary stream splitting - 2014 Urethral Stricture, Unspec, Urethral stricture - 2014      PMH Notes: .   NON-GU PMH: Drug induced constipation - 2020 Muscle weakness (generalized) - 2020 Other specified disorders of muscle - 2020 Encounter for  general adult medical examination without abnormal findings, Encounter for preventive health examination - 2015 COPD, Chronic Obstructive Pulmonary Disease - 2014 Personal history of other diseases of the digestive system, History of esophageal reflux - 2014 Personal history of other endocrine, nutritional and  metabolic disease, History of hypercholesterolemia - 2014 Personal history of other mental and behavioral disorders, History of depression - 2014 Anxiety Lung Cancer, History    FAMILY HISTORY: Death In The Family Father - Runs In Family Death In The Family Mother - Runs In Family Family Health Status Number - Runs In Family No pertinent family history - Other   SOCIAL HISTORY: Marital Status: Married Preferred Language: English; Ethnicity: Not Hispanic Or Latino; Race: White Current Smoking Status: Patient does not smoke anymore.   Tobacco Use Assessment Completed: Used Tobacco in last 30 days? Does not use smokeless tobacco. Does not drink anymore.  Does not use drugs. Drinks 3 caffeinated drinks per day.    REVIEW OF SYSTEMS:    GU Review Female:   Patient reports burning /pain with urination. Patient denies frequent urination, hard to postpone urination, get up at night to urinate, leakage of urine, stream starts and stops, trouble starting your stream, have to strain to urinate, and being pregnant.  Gastrointestinal (Upper):   Patient denies nausea, vomiting, and indigestion/ heartburn.  Gastrointestinal (Lower):   Patient denies diarrhea and constipation.  Constitutional:   Patient denies fever, night sweats, weight loss, and fatigue.  Skin:   Patient denies skin rash/ lesion and itching.  Eyes:   Patient denies blurred vision and double vision.  Ears/ Nose/ Throat:   Patient denies sore throat and sinus problems.  Hematologic/Lymphatic:   Patient denies swollen glands and easy bruising.  Cardiovascular:   Patient denies leg swelling and chest pains.  Respiratory:   Patient reports shortness of breath. Patient denies cough.  Endocrine:   Patient denies excessive thirst.  Musculoskeletal:   Patient reports back pain. Patient denies joint pain.  Neurological:   Patient denies headaches and dizziness.  Psychologic:   Patient denies depression and anxiety.   Notes: Believes  her Trellegy medicine is causing her hematuria.    VITAL SIGNS:      11/16/2019 10:46 AM  Weight 120 lb / 54.43 kg  Height 65.5 in / 166.37 cm  BP 116/75 mmHg  Heart Rate 94 /min  Temperature 97.3 F / 36.2 C  BMI 19.7 kg/m   GU PHYSICAL EXAMINATION:      Notes: Left CVA tenderness and left lower abdominal tenderness   MULTI-SYSTEM PHYSICAL EXAMINATION:    Constitutional: Well-nourished. No physical deformities. Normally developed. Good grooming.  Respiratory: No labored breathing, no use of accessory muscles.   Cardiovascular: Normal temperature, normal extremity pulses, no swelling, no varicosities.  Skin: No paleness, no jaundice, no cyanosis. No lesion, no ulcer, no rash.  Neurologic / Psychiatric: Oriented to time, oriented to place, oriented to person. No depression, no anxiety, no agitation.  Gastrointestinal: No mass, no tenderness, no rigidity, non obese abdomen.  Musculoskeletal: Normal gait and station of head and neck.     Complexity of Data:  Source Of History:  Patient, Medical Record Summary  Records Review:   Previous Doctor Records, Previous Patient Records  Urine Test Review:   Urinalysis  X-Ray Review: C.T. Abdomen/Pelvis: Reviewed Films. Discussed With Patient.     11/16/19  Urinalysis  Urine Appearance Cloudy   Urine Color Amber   Urine Glucose Neg mg/dL  Urine Bilirubin Neg mg/dL  Urine Ketones Neg mg/dL  Urine Specific Gravity 1.025   Urine Blood 3+ ery/uL  Urine pH 5.5   Urine Protein 1+ mg/dL  Urine Urobilinogen 0.2 mg/dL  Urine Nitrites Positive   Urine Leukocyte Esterase 2+ leu/uL  Urine WBC/hpf 10 - 20/hpf   Urine RBC/hpf >60/hpf   Urine Epithelial Cells 0 - 5/hpf   Urine Bacteria Many (>50/hpf)   Urine Mucous Not Present   Urine Yeast NS (Not Seen)   Urine Trichomonas Not Present   Urine Cystals NS (Not Seen)   Urine Casts NS (Not Seen)   Urine Sperm Not Present    PROCEDURES:         C.T. Urogram - [id number]      Patient confirmed  No Neulasta OnPro Device.         Urinalysis w/Scope Dipstick Dipstick Cont'd Micro  Color: Amber Bilirubin: Neg mg/dL WBC/hpf: 10 - 20/hpf  Appearance: Cloudy Ketones: Neg mg/dL RBC/hpf: >60/hpf  Specific Gravity: 1.025 Blood: 3+ ery/uL Bacteria: Many (>50/hpf)  pH: 5.5 Protein: 1+ mg/dL Cystals: NS (Not Seen)  Glucose: Neg mg/dL Urobilinogen: 0.2 mg/dL Casts: NS (Not Seen)    Nitrites: Positive Trichomonas: Not Present    Leukocyte Esterase: 2+ leu/uL Mucous: Not Present      Epithelial Cells: 0 - 5/hpf      Yeast: NS (Not Seen)      Sperm: Not Present    ASSESSMENT:      ICD-10 Details  1 GU:   Acute Cystitis/UTI - N30.00 Acute, Systemic Symptoms  2   Ureteral calculus - N20.1 Left, Acute, Systemic Symptoms   PLAN:            Medications New Meds: Cipro 250 mg tablet 1 tablet PO BID   #14  0 Refill(s)  Tamsulosin Hcl 0.4 mg capsule 1 capsule PO Q HS   #15  0 Refill(s)  Hydrocodone-Acetaminophen 5 mg-325 mg tablet 1 tablet PO Q 6 H PRN for sever pain  #20  0 Refill(s)    Refill Meds: Promethazine Hcl 25 mg tablet 1 tablet PO Q 8 H PRN   #25  0 Refill(s)    Stop Meds: Macrobid 100 mg capsule 1 capsule PO BID  Start: 11/16/2019  Stop: 11/23/2019   Discontinue: 11/16/2019  - Reason: The medication was entered incorrectly.            Orders Labs CULTURE, URINE  X-Rays: C.T. Stone Protocol Without Contrast  X-Ray Notes: History:  Hematuria: Yes/No  Patient to see MD after exam: Yes/No  Previous exam: CT / IVP/ US/ KUB/ None  When:  Where:  Diabetic: Yes/ No  BUN/ Creatine:  Date of last BUN Creatinine:  Weight in pounds:  Allergy- Contrasts/ Shellfish: Yes/ No  Conflicting diabetic meds: Yes/ No  Oral contrast and instructions given to patient:   Prior Authorization #: [id number]            Schedule         Document Letter(s):  Created for Patient: Clinical Summary         Notes:   The urinalysis is concerning for infection. I will begin empirical  treatment with Cipro 250 mg as she tolerates this medication well. Urine is sent for culture today. CT scan shows concern for a 6 mm as well as a 3 mm obstructing left distal stone. With her intermittent fevers, pain for 6 weeks and the intermittent nausea I discussed her presentation with on-call physician he who agreed that  it was safest to stent her this evening. I discussed this with the patient as well as the risks to the procedure including risk for injury to surrounding structure, risk for bleeding, and risk for worsening condition, well as risk for anesthesia. Postoperative medications are sent to her pharmacy including pain medication, Flomax, and Phenergan. I did give her Gemtesa samples for urgency and frequency from the stent. She would like to follow up with another urologist. Requested Dr. Raliegh Ip, in 10 days for further discussion about definitive stone treatment. Advised strict return precautions in the interval. She will proceed to [facility] surgical center        Next Appointment:      Next Appointment: 11/16/2019 04:30 PM    Appointment Type: Surgery     Location: [facility] [phone]    Provider: Raynelle Bring, M.D.    Reason for Visit: [facility]/OP      * Signed by Daine Gravel, NP on 11/16/19 at 1:51 PM (EDT)*

## 2019-11-16 NOTE — Anesthesia Procedure Notes (Signed)
Procedure Name: LMA Insertion Performed by: Fischer Halley J, CRNA Pre-anesthesia Checklist: Patient identified, Emergency Drugs available, Suction available, Patient being monitored and Timeout performed Patient Re-evaluated:Patient Re-evaluated prior to induction Oxygen Delivery Method: Circle system utilized Preoxygenation: Pre-oxygenation with 100% oxygen Induction Type: IV induction Ventilation: Mask ventilation without difficulty LMA: LMA inserted LMA Size: 4.0 Number of attempts: 1 Placement Confirmation: positive ETCO2 and breath sounds checked- equal and bilateral Tube secured with: Tape Dental Injury: Teeth and Oropharynx as per pre-operative assessment        

## 2019-11-16 NOTE — Transfer of Care (Signed)
Immediate Anesthesia Transfer of Care Note  Patient: Cinzia Devos  Procedure(s) Performed: CYSTOSCOPY WITH STENT REPLACEMENT (Left )  Patient Location: PACU  Anesthesia Type:General  Level of Consciousness: drowsy and patient cooperative  Airway & Oxygen Therapy: Patient Spontanous Breathing and Patient connected to face mask oxygen  Post-op Assessment: Report given to RN and Post -op Vital signs reviewed and stable  Post vital signs: Reviewed and stable  Last Vitals:  Vitals Value Taken Time  BP 112/54 11/16/19 1828  Temp    Pulse 90 11/16/19 1830  Resp 20 11/16/19 1830  SpO2 100 % 11/16/19 1830  Vitals shown include unvalidated device data.  Last Pain:  Vitals:   11/16/19 1456  TempSrc: Oral         Complications: No complications documented.

## 2019-11-16 NOTE — Telephone Encounter (Signed)
Spoke with patient. Appointment scheduled with Dr.Silva for 10/14 at 10 am. Patient is agreeable to date and time. Encounter closed.

## 2019-11-16 NOTE — Telephone Encounter (Signed)
Patients husband left message to cancel patients surgery consult tomorrow due to having kidney stones. They would like to reschedule appointment.

## 2019-11-16 NOTE — H&P (View-Only) (Signed)
Office Visit Report     11/16/2019   --------------------------------------------------------------------------------   Norma Fredrickson  MRN: [id number]  DOB: 07/15/1950, 69 year old Female  SSN: [id number]   PRIMARY CARE:  Frann Rider, MD  REFERRING:  Georgette Dover, MD  PROVIDER:  Festus Aloe, M.D.  TREATING:  Daine Gravel, NP  LOCATION:  [facility] [phone]     --------------------------------------------------------------------------------   CC/HPI: cc: Gross hematuria, intermittent fevers   HPI: 69 year old female with a past medical history of COPD, lung cancer, and a cystocele presents with gross hematuria and intermittent fevers along with left lower quadrant pain for the past month. She has been on 2 separate antibiotics over the past month for fevers. It is fevers were treated by her primary care doctor who thought this was related to her COPD and pneumonia. Her fevers have ranged from 99-101.9. She has been treated with 2 separate antibiotics. She has not had a fever in the past few days. However, she just finished an antibiotic 5 days ago. She does endorse nausea. The lower flank pain has been present intermittently and has been severe at times. She presents today with gross hematuria that began again 2 days ago, dysuria, and left-sided abdominal pain. She believes that the gross hematuria was caused by her new COPD medication trellegy. She does have a remote history of nephrolithiasis. She has not seen a stone pass. She is nontoxic appearing. Her urine is concerning for bacteriuria, positive nitrites, and leukocytes.     ALLERGIES: Adhesive tape - Skin Rash Amoxicillin TABS - Other Reaction, thrush Septra DS TABS - Hives    MEDICATIONS: Alprazolam 0.5 mg tablet 1 tablet PO Daily  Belsomra 10 mg tablet 1 tablet PO Daily  Breo Ellipta 100 mcg-25 mcg/dose blister, with inhalation device 1 PO Daily  Coq-10  Cymbalta  Glucosamine Sulfate   Promethazine Hcl 25 mg tablet 1 tablet PO Q 8 H PRN  Rosuvastatin Calcium 5 mg tablet 1 tablet PO Daily     GU PSH: Catheterization For Collection Of Specimen, Single Patient, All Places Of Service - 2018 Catheterize For Residual - 2018 Cystoscopy - 12/23/2017 D&C Non-OB - 2007     NON-GU PSH: Back surgery Bmi<30 And >=22 Calc & Docu - 2020 Breast lumpectomy Cataract surgery Cholecystectomy (open) - 2014 Doc Meds Verified W/Pt Or Re - 2020 Eye Surgery (Unspecified) Knee Arthroscopy Pain Doc Pos And Plan - 2020 Partial Remove Lung - 2007 Remove Breast Lesion - 2007     GU PMH: Cystocele, midline - 2020 Gross hematuria - 12/08/2017, - 2017, Gross hematuria, - 2015 Renal calculus - 12/08/2017 Urinary Frequency - 12/08/2017 Weak Urinary Stream - 12/08/2017 Dorsalgia, Unspec (Worsening, Chronic), Ketorolac 30 mg IM today. Ketorolac 10 mg 1 po Q6 hrs prn. Instructed to contact office if she has worsening/persist pain - may need CT urogram. Instructed to alternate ice/heat for back pain. No overt hydronephrosis noted on RUS. If nausea persist recommend she f/u w/PCP - 2018 Ureteral calculus - 2018, - 2018, - 2018 Hemorrhagic cystitis, Acute cystitis with hematuria - 2015 Acute Cystitis/UTI, Acute cystitis without hematuria - 2014 Dysuria, Dysuria - 2014 Oth GU systems Signs/Symptoms, Bladder pain - 2014 Splitting of urinary stream, Urinary stream splitting - 2014 Urethral Stricture, Unspec, Urethral stricture - 2014      PMH Notes: .   NON-GU PMH: Drug induced constipation - 2020 Muscle weakness (generalized) - 2020 Other specified disorders of muscle - 2020 Encounter for  general adult medical examination without abnormal findings, Encounter for preventive health examination - 2015 COPD, Chronic Obstructive Pulmonary Disease - 2014 Personal history of other diseases of the digestive system, History of esophageal reflux - 2014 Personal history of other endocrine, nutritional and  metabolic disease, History of hypercholesterolemia - 2014 Personal history of other mental and behavioral disorders, History of depression - 2014 Anxiety Lung Cancer, History    FAMILY HISTORY: Death In The Family Father - Runs In Family Death In The Family Mother - Runs In Family Family Health Status Number - Runs In Family No pertinent family history - Other   SOCIAL HISTORY: Marital Status: Married Preferred Language: English; Ethnicity: Not Hispanic Or Latino; Race: White Current Smoking Status: Patient does not smoke anymore.   Tobacco Use Assessment Completed: Used Tobacco in last 30 days? Does not use smokeless tobacco. Does not drink anymore.  Does not use drugs. Drinks 3 caffeinated drinks per day.    REVIEW OF SYSTEMS:    GU Review Female:   Patient reports burning /pain with urination. Patient denies frequent urination, hard to postpone urination, get up at night to urinate, leakage of urine, stream starts and stops, trouble starting your stream, have to strain to urinate, and being pregnant.  Gastrointestinal (Upper):   Patient denies nausea, vomiting, and indigestion/ heartburn.  Gastrointestinal (Lower):   Patient denies diarrhea and constipation.  Constitutional:   Patient denies fever, night sweats, weight loss, and fatigue.  Skin:   Patient denies skin rash/ lesion and itching.  Eyes:   Patient denies blurred vision and double vision.  Ears/ Nose/ Throat:   Patient denies sore throat and sinus problems.  Hematologic/Lymphatic:   Patient denies swollen glands and easy bruising.  Cardiovascular:   Patient denies leg swelling and chest pains.  Respiratory:   Patient reports shortness of breath. Patient denies cough.  Endocrine:   Patient denies excessive thirst.  Musculoskeletal:   Patient reports back pain. Patient denies joint pain.  Neurological:   Patient denies headaches and dizziness.  Psychologic:   Patient denies depression and anxiety.   Notes: Believes  her Trellegy medicine is causing her hematuria.    VITAL SIGNS:      11/16/2019 10:46 AM  Weight 120 lb / 54.43 kg  Height 65.5 in / 166.37 cm  BP 116/75 mmHg  Heart Rate 94 /min  Temperature 97.3 F / 36.2 C  BMI 19.7 kg/m   GU PHYSICAL EXAMINATION:      Notes: Left CVA tenderness and left lower abdominal tenderness   MULTI-SYSTEM PHYSICAL EXAMINATION:    Constitutional: Well-nourished. No physical deformities. Normally developed. Good grooming.  Respiratory: No labored breathing, no use of accessory muscles.   Cardiovascular: Normal temperature, normal extremity pulses, no swelling, no varicosities.  Skin: No paleness, no jaundice, no cyanosis. No lesion, no ulcer, no rash.  Neurologic / Psychiatric: Oriented to time, oriented to place, oriented to person. No depression, no anxiety, no agitation.  Gastrointestinal: No mass, no tenderness, no rigidity, non obese abdomen.  Musculoskeletal: Normal gait and station of head and neck.     Complexity of Data:  Source Of History:  Patient, Medical Record Summary  Records Review:   Previous Doctor Records, Previous Patient Records  Urine Test Review:   Urinalysis  X-Ray Review: C.T. Abdomen/Pelvis: Reviewed Films. Discussed With Patient.     11/16/19  Urinalysis  Urine Appearance Cloudy   Urine Color Amber   Urine Glucose Neg mg/dL  Urine Bilirubin Neg mg/dL  Urine Ketones Neg mg/dL  Urine Specific Gravity 1.025   Urine Blood 3+ ery/uL  Urine pH 5.5   Urine Protein 1+ mg/dL  Urine Urobilinogen 0.2 mg/dL  Urine Nitrites Positive   Urine Leukocyte Esterase 2+ leu/uL  Urine WBC/hpf 10 - 20/hpf   Urine RBC/hpf >60/hpf   Urine Epithelial Cells 0 - 5/hpf   Urine Bacteria Many (>50/hpf)   Urine Mucous Not Present   Urine Yeast NS (Not Seen)   Urine Trichomonas Not Present   Urine Cystals NS (Not Seen)   Urine Casts NS (Not Seen)   Urine Sperm Not Present    PROCEDURES:         C.T. Urogram - [id number]      Patient confirmed  No Neulasta OnPro Device.         Urinalysis w/Scope Dipstick Dipstick Cont'd Micro  Color: Amber Bilirubin: Neg mg/dL WBC/hpf: 10 - 20/hpf  Appearance: Cloudy Ketones: Neg mg/dL RBC/hpf: >60/hpf  Specific Gravity: 1.025 Blood: 3+ ery/uL Bacteria: Many (>50/hpf)  pH: 5.5 Protein: 1+ mg/dL Cystals: NS (Not Seen)  Glucose: Neg mg/dL Urobilinogen: 0.2 mg/dL Casts: NS (Not Seen)    Nitrites: Positive Trichomonas: Not Present    Leukocyte Esterase: 2+ leu/uL Mucous: Not Present      Epithelial Cells: 0 - 5/hpf      Yeast: NS (Not Seen)      Sperm: Not Present    ASSESSMENT:      ICD-10 Details  1 GU:   Acute Cystitis/UTI - N30.00 Acute, Systemic Symptoms  2   Ureteral calculus - N20.1 Left, Acute, Systemic Symptoms   PLAN:            Medications New Meds: Cipro 250 mg tablet 1 tablet PO BID   #14  0 Refill(s)  Tamsulosin Hcl 0.4 mg capsule 1 capsule PO Q HS   #15  0 Refill(s)  Hydrocodone-Acetaminophen 5 mg-325 mg tablet 1 tablet PO Q 6 H PRN for sever pain  #20  0 Refill(s)    Refill Meds: Promethazine Hcl 25 mg tablet 1 tablet PO Q 8 H PRN   #25  0 Refill(s)    Stop Meds: Macrobid 100 mg capsule 1 capsule PO BID  Start: 11/16/2019  Stop: 11/23/2019   Discontinue: 11/16/2019  - Reason: The medication was entered incorrectly.            Orders Labs CULTURE, URINE  X-Rays: C.T. Stone Protocol Without Contrast  X-Ray Notes: History:  Hematuria: Yes/No  Patient to see MD after exam: Yes/No  Previous exam: CT / IVP/ US/ KUB/ None  When:  Where:  Diabetic: Yes/ No  BUN/ Creatine:  Date of last BUN Creatinine:  Weight in pounds:  Allergy- Contrasts/ Shellfish: Yes/ No  Conflicting diabetic meds: Yes/ No  Oral contrast and instructions given to patient:   Prior Authorization #: [id number]            Schedule         Document Letter(s):  Created for Patient: Clinical Summary         Notes:   The urinalysis is concerning for infection. I will begin empirical  treatment with Cipro 250 mg as she tolerates this medication well. Urine is sent for culture today. CT scan shows concern for a 6 mm as well as a 3 mm obstructing left distal stone. With her intermittent fevers, pain for 6 weeks and the intermittent nausea I discussed her presentation with on-call physician he who agreed that  it was safest to stent her this evening. I discussed this with the patient as well as the risks to the procedure including risk for injury to surrounding structure, risk for bleeding, and risk for worsening condition, well as risk for anesthesia. Postoperative medications are sent to her pharmacy including pain medication, Flomax, and Phenergan. I did give her Gemtesa samples for urgency and frequency from the stent. She would like to follow up with another urologist. Requested Dr. Raliegh Ip, in 10 days for further discussion about definitive stone treatment. Advised strict return precautions in the interval. She will proceed to [facility] surgical center        Next Appointment:      Next Appointment: 11/16/2019 04:30 PM    Appointment Type: Surgery     Location: [facility] [phone]    Provider: Raynelle Bring, M.D.    Reason for Visit: WL/OP      * Signed by Daine Gravel, NP on 11/16/19 at 1:51 PM (EDT)*

## 2019-11-16 NOTE — Progress Notes (Deleted)
GYNECOLOGY  VISIT   HPI: 69 y.o.   Married  Caucasian  female   G2P2002 with Patient's last menstrual period was 09/26/2013 (exact date).   here for surgical consult for uterine prolapse and family history of ovarian cancer.   GYNECOLOGIC HISTORY: Patient's last menstrual period was 09/26/2013 (exact date). Contraception: Tubal/PMP Menopausal hormone therapy:  ***None Last mammogram:  08-22-19 3D/Neg/density C/Birads1 Last pap smear:11-01-19 Neg, 04-07-17 Neg, 02-20-14 Neg:Neg HR HPV        OB History    Gravida  2   Para  2   Term  2   Preterm  0   AB  0   Living  2     SAB  0   TAB  0   Ectopic  0   Multiple  0   Live Births  2              Patient Active Problem List   Diagnosis Date Noted  . COPD with acute exacerbation ([place]) 10/29/2019  . Chronic cough 04/11/2019  . Moderate COPD (chronic obstructive pulmonary disease) ([place]) 12/25/2014  . Nodule of right lung 12/11/2014  . 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 Primary cancer of left upper lobe of lung ([place]) 11/27/2011  . Symptomatic cholelithiasis 09/22/2011    Past Medical History:  Diagnosis Date  . Anxiety   . Arthritis 2018  . Bulging lumbar disc 2018  . Complication of anesthesia    "low blood pressure after retinal detachment surgery"  . COPD (chronic obstructive pulmonary disease) ([place])   . Degenerative disc disease, lumbar 2018  . Depression   . Emphysema of lung ([place])   . History of bronchitis   . History of pneumonia    "multiple times"  . Hyperlipidemia   . Hyperlipidemia   . Kidney stones 02/2016  . lung ca 05/2003  . Lung cancer ([place])    s/p LUL lobectomy  . Osteoarthritis    spine, knees, hands  . PONV (postoperative nausea and vomiting)    pt "could not eat during hospital stay"  . Reflux esophagitis   . Rhinitis   . Scoliosis 2018  . Shortness of breath   . Wears glasses     Past Surgical History:  Procedure Laterality Date  . BREAST EXCISIONAL BIOPSY    . BREAST  LUMPECTOMY WITH NEEDLE LOCALIZATION Right 04/11/2013   Procedure: BREAST LUMPECTOMY WITH NEEDLE LOCALIZATION;  Surgeon: Odis Hollingshead, MD;  Location: [place];  Service: General;  Laterality: Right;  . BREAST SURGERY  1992   lt br bx  . CHOLECYSTECTOMY  11/11/2011   Procedure: LAPAROSCOPIC CHOLECYSTECTOMY WITH INTRAOPERATIVE CHOLANGIOGRAM;  Surgeon: Odis Hollingshead, MD;  Location: [place];  Service: General;  Laterality: N/A;  . COLONOSCOPY    . CYSTOSCOPY  12/10   for evaluation of hematuria  . DILATION AND CURETTAGE OF UTERUS    . ESOPHAGOGASTRODUODENOSCOPY    . EYE SURGERY     catataracts-both  . KNEE SURGERY     rt knee torn cartilage  . LUNG LOBECTOMY Left 4/05  . RETINAL DETACHMENT SURGERY  2011   left; x2  . SINUS SURGERY WITH INSTATRAK    . TUBAL LIGATION    . VIDEO BRONCHOSCOPY WITH ENDOBRONCHIAL NAVIGATION N/A 01/15/2015   Procedure: VIDEO BRONCHOSCOPY WITH ENDOBRONCHIAL NAVIGATION;  Surgeon: [name], MD;  Location: [place];  Service: Thoracic;  Laterality: N/A;  . VOCAL CORD LATERALIZATION, ENDOSCOPIC APPROACH W/ MLB  nodule removed    Current Outpatient Medications  Medication Sig Dispense Refill  . acetaminophen-codeine (TYLENOL #3) 300-30 MG tablet Take 1 tablet by mouth every 8 (eight) hours as needed.    . ALPRAZolam (XANAX) 0.5 MG tablet Take 0.5 mg by mouth at bedtime as needed.    Marland Kitchen azelastine (ASTELIN) 0.1 % nasal spray Place 2 sprays into both nostrils 2 (two) times daily.    . baclofen (LIORESAL) 10 MG tablet TAKE 1 TABLET (10MG ) BY MOUTH 2 TIMES A DAY AS NEEDED    . celecoxib (CELEBREX) 200 MG capsule Take 200 mg by mouth daily.    . clonazePAM (KLONOPIN) 0.5 MG tablet Take 0.5 mg by mouth 2 (two) times daily as needed for anxiety.    . CYMBALTA 60 MG capsule Take 120 mg by mouth daily.     . fluticasone (FLONASE) 50 MCG/ACT nasal spray Place 2 sprays into both nostrils daily.    . Fluticasone-Umeclidin-Vilant (TRELEGY  ELLIPTA) 200-62.5-25 MCG/INH AEPB Inhale 1 puff into the lungs daily. 28 each 0  . LINZESS 72 MCG capsule PLEASE SEE ATTACHED FOR DETAILED DIRECTIONS    . rosuvastatin (CRESTOR) 5 MG tablet Take 5 mg by mouth 4 (four) times a week.    . traMADol (ULTRAM) 50 MG tablet TAKE 1 TO 2 TABLETS BY MOUTH 3 TIMES A DAY    . WIXELA INHUB 100-50 MCG/DOSE AEPB TAKE 1 PUFF BY MOUTH TWICE A DAY 180 each 0   No current facility-administered medications for this visit.     ALLERGIES: Adhesive [tape], Septra [sulfamethoxazole-trimethoprim], and Amoxicillin  Family History  Problem Relation Age of Onset  . Cancer Mother 23       ovarian cancer  . Hypertension Mother   . Diabetes Mother   . Alcohol abuse Father     Social History   Socioeconomic History  . Marital status: Married    Spouse name: Not on file  . Number of children: 2  . Years of education: Not on file  . Highest education level: Not on file  Occupational History  . Occupation: Retired  Tobacco Use  . Smoking status: Former Smoker    Packs/day: 2.00    Years: 45.00    Pack years: 90.00    Start date: 1965    Quit date: 09/21/2008    Years since quitting: 11.1  . Smokeless tobacco: Never Used  Vaping Use  . Vaping Use: Never used  Substance and Sexual Activity  . Alcohol use: Yes    Alcohol/week: 0.0 - 1.0 standard drinks  . Drug use: No  . Sexual activity: Not Currently    Partners: Male    Birth control/protection: Post-menopausal  Other Topics Concern  . Not on file  Social History Narrative  . Not on file   Social Determinants of Health   Financial Resource Strain:   . Difficulty of Paying Living Expenses: Not on file  Food Insecurity:   . Worried About Charity fundraiser in the Last Year: Not on file  . Ran Out of Food in the Last Year: Not on file  Transportation Needs:   . Lack of Transportation (Medical): Not on file  . Lack of Transportation (Non-Medical): Not on file  Physical Activity:   . Days of  Exercise per Week: Not on file  . Minutes of Exercise per Session: Not on file  Stress:   . Feeling of Stress : Not on file  Social Connections:   . Frequency of  Communication with Friends and Family: Not on file  . Frequency of Social Gatherings with Friends and Family: Not on file  . Attends Religious Services: Not on file  . Active Member of Clubs or Organizations: Not on file  . Attends Archivist Meetings: Not on file  . Marital Status: Not on file  Intimate Partner Violence:   . Fear of Current or Ex-Partner: Not on file  . Emotionally Abused: Not on file  . Physically Abused: Not on file  . Sexually Abused: Not on file    Review of Systems  PHYSICAL EXAMINATION:    LMP 09/26/2013 (Exact Date)     General appearance: alert, cooperative and appears stated age Head: Normocephalic, without obvious abnormality, atraumatic Neck: no adenopathy, supple, symmetrical, trachea midline and thyroid normal to inspection and palpation Lungs: clear to auscultation bilaterally Breasts: normal appearance, no masses or tenderness, No nipple retraction or dimpling, No nipple discharge or bleeding, No axillary or supraclavicular adenopathy Heart: regular rate and rhythm Abdomen: soft, non-tender, no masses,  no organomegaly Extremities: extremities normal, atraumatic, no cyanosis or edema Skin: Skin color, texture, turgor normal. No rashes or lesions Lymph nodes: Cervical, supraclavicular, and axillary nodes normal. No abnormal inguinal nodes palpated Neurologic: Grossly normal  Pelvic: External genitalia:  no lesions              Urethra:  normal appearing urethra with no masses, tenderness or lesions              Bartholins and Skenes: normal                 Vagina: normal appearing vagina with normal color and discharge, no lesions              Cervix: no lesions                Bimanual Exam:  Uterus:  normal size, contour, position, consistency, mobility, non-tender               Adnexa: no mass, fullness, tenderness              Rectal exam: {yes no:[id number]}.  Confirms.              Anus:  normal sphincter tone, no lesions  Chaperone was present for exam.  ASSESSMENT     PLAN     An After Visit Summary was printed and given to the patient.  ______ minutes face to face time of which over 50% was spent in counseling.

## 2019-11-16 NOTE — Discharge Instructions (Addendum)

## 2019-11-16 NOTE — Anesthesia Preprocedure Evaluation (Addendum)
Anesthesia Evaluation  Patient identified by MRN, date of birth, ID band Patient awake    Reviewed: Allergy & Precautions, NPO status , Patient's Chart, lab work & pertinent test results  History of Anesthesia Complications (+) PONV and history of anesthetic complications  Airway Mallampati: II  TM Distance: >3 FB Neck ROM: Full    Dental  (+) Dental Advisory Given   Pulmonary COPD,  COPD inhaler, former smoker,   Hx lung cancer s/p lobectomy    Pulmonary exam normal        Cardiovascular Normal cardiovascular exam   '20 Myoperfusion - The left ventricular ejection fraction is hyperdynamic (>65%). Nuclear stress EF: 66%. There was no ST segment deviation noted during stress. The study is normal. This is a low risk study.  '20 TTE - EF 55 to 60%. Grade I diastolic dysfunction (impaired relaxation). Trivial MR, TR, PR, and AI. Mild aortic valve sclerosis without stenosis.     Neuro/Psych PSYCHIATRIC DISORDERS Anxiety Depression negative neurological ROS     GI/Hepatic negative GI ROS, Neg liver ROS,   Endo/Other  negative endocrine ROS  Renal/GU negative Renal ROS     Musculoskeletal  (+) Arthritis , Osteoarthritis,   Scoliosis    Abdominal   Peds  Hematology negative hematology ROS (+)   Anesthesia Other Findings Hx vocal cord surgery Covid test negative   Reproductive/Obstetrics                           Anesthesia Physical Anesthesia Plan  ASA: II  Anesthesia Plan: General   Post-op Pain Management:    Induction: Intravenous  PONV Risk Score and Plan: 4 or greater and Treatment may vary due to age or medical condition, Ondansetron and Dexamethasone  Airway Management Planned: LMA  Additional Equipment: None  Intra-op Plan:   Post-operative Plan: Extubation in OR  Informed Consent: I have reviewed the patients History and Physical, chart, labs and discussed the  procedure including the risks, benefits and alternatives for the proposed anesthesia with the patient or authorized representative who has indicated his/her understanding and acceptance.     Dental advisory given  Plan Discussed with: CRNA and Anesthesiologist  Anesthesia Plan Comments:        Anesthesia Quick Evaluation

## 2019-11-16 NOTE — Telephone Encounter (Signed)
Left message to call Montasia Chisenhall at [phone]. 

## 2019-11-17 ENCOUNTER — Encounter (HOSPITAL_COMMUNITY): Payer: Self-pay | Admitting: Urology

## 2019-11-17 ENCOUNTER — Ambulatory Visit: Payer: Medicare Other | Admitting: Obstetrics and Gynecology

## 2019-11-17 NOTE — Telephone Encounter (Signed)
Spoke with patient. Patient states that she has decided she would like to wait to proceed with surgery with Dr.Miller once she is at the new [place] location. Advised I will notify Dr.Miller so that she is aware and arrangements can be made. Patient verbalizes understanding.  Routing to provider and will close encounter.

## 2019-11-17 NOTE — Telephone Encounter (Signed)
Patient is returning call. Patient states she may want to wait on surgery and possibly have it done with Dr. Sabra Heck.

## 2019-11-21 NOTE — Anesthesia Postprocedure Evaluation (Signed)
Anesthesia Post Note  Patient: Tammy Eaton  Procedure(s) Performed: CYSTOSCOPY WITH STENT REPLACEMENT (Left )     Patient location during evaluation: PACU Anesthesia Type: General Level of consciousness: awake and alert Pain management: pain level controlled Vital Signs Assessment: post-procedure vital signs reviewed and stable Respiratory status: spontaneous breathing, respiratory function stable and nonlabored ventilation Cardiovascular status: blood pressure returned to baseline and stable Postop Assessment: no apparent nausea or vomiting Anesthetic complications: no   No complications documented.  Last Vitals:  Vitals:   11/16/19 1845 11/16/19 1900  BP: 105/61 (!) 121/50  Pulse: 93 87  Resp: 15 14  Temp:  37.1 C  SpO2: 100% 95%    Last Pain:  Vitals:   11/16/19 1900  TempSrc:   PainSc: 0-No pain                 Audry Pili

## 2019-11-25 ENCOUNTER — Encounter (HOSPITAL_COMMUNITY): Payer: Self-pay | Admitting: Urology

## 2019-11-25 ENCOUNTER — Other Ambulatory Visit: Payer: Self-pay

## 2019-11-25 ENCOUNTER — Other Ambulatory Visit: Payer: Self-pay | Admitting: Urology

## 2019-11-25 ENCOUNTER — Other Ambulatory Visit (HOSPITAL_COMMUNITY)
Admission: RE | Admit: 2019-11-25 | Discharge: 2019-11-25 | Disposition: A | Discharge status: Home / Self Care | Payer: Medicare Other | Source: Ambulatory Visit | Attending: Urology | Admitting: Urology

## 2019-11-25 DIAGNOSIS — Z01812 Encounter for preprocedural laboratory examination: Secondary | ICD-10-CM | POA: Insufficient documentation

## 2019-11-25 DIAGNOSIS — Z20822 Contact with and (suspected) exposure to covid-19: Secondary | ICD-10-CM | POA: Diagnosis not present

## 2019-11-25 LAB — SARS CORONAVIRUS 2 (TAT 6-24 HRS): SARS Coronavirus 2: NEGATIVE

## 2019-11-28 ENCOUNTER — Inpatient Hospital Stay (HOSPITAL_COMMUNITY): Admission: RE | Admit: 2019-11-28 | Payer: Medicare Other | Source: Ambulatory Visit

## 2019-11-29 ENCOUNTER — Encounter (HOSPITAL_COMMUNITY): Payer: Self-pay | Admitting: Urology

## 2019-11-29 ENCOUNTER — Ambulatory Visit (HOSPITAL_COMMUNITY): Payer: Medicare Other

## 2019-11-29 ENCOUNTER — Ambulatory Visit (HOSPITAL_COMMUNITY): Payer: Medicare Other | Admitting: Physician Assistant

## 2019-11-29 ENCOUNTER — Encounter (HOSPITAL_COMMUNITY)
Admission: RE | Disposition: A | Discharge status: Home / Self Care | Payer: Self-pay | Source: Home / Self Care | Attending: Urology

## 2019-11-29 ENCOUNTER — Ambulatory Visit (HOSPITAL_COMMUNITY): Payer: Medicare Other | Admitting: Anesthesiology

## 2019-11-29 ENCOUNTER — Ambulatory Visit (HOSPITAL_COMMUNITY)
Admission: RE | Admit: 2019-11-29 | Discharge: 2019-11-29 | Disposition: A | Discharge status: Home / Self Care | Payer: Medicare Other | Attending: Urology | Admitting: Urology

## 2019-11-29 DIAGNOSIS — Z902 Acquired absence of lung [part of]: Secondary | ICD-10-CM | POA: Insufficient documentation

## 2019-11-29 DIAGNOSIS — Z87442 Personal history of urinary calculi: Secondary | ICD-10-CM | POA: Diagnosis not present

## 2019-11-29 DIAGNOSIS — Z85118 Personal history of other malignant neoplasm of bronchus and lung: Secondary | ICD-10-CM | POA: Insufficient documentation

## 2019-11-29 DIAGNOSIS — Z880 Allergy status to penicillin: Secondary | ICD-10-CM | POA: Insufficient documentation

## 2019-11-29 DIAGNOSIS — Z87891 Personal history of nicotine dependence: Secondary | ICD-10-CM | POA: Diagnosis not present

## 2019-11-29 DIAGNOSIS — J449 Chronic obstructive pulmonary disease, unspecified: Secondary | ICD-10-CM | POA: Insufficient documentation

## 2019-11-29 DIAGNOSIS — N201 Calculus of ureter: Secondary | ICD-10-CM | POA: Diagnosis not present

## 2019-11-29 HISTORY — PX: URETEROSCOPY WITH HOLMIUM LASER LITHOTRIPSY: SHX6645

## 2019-11-29 LAB — BASIC METABOLIC PANEL
Anion gap: 12 (ref 5–15)
BUN: 15 mg/dL (ref 8–23)
CO2: 23 mmol/L (ref 22–32)
Calcium: 8.9 mg/dL (ref 8.9–10.3)
Chloride: 105 mmol/L (ref 98–111)
Creatinine, Ser: 0.77 mg/dL (ref 0.44–1.00)
GFR, Estimated: >60 mL/min (ref >60)
Glucose, Bld: 90 mg/dL (ref 70–99)
Potassium: 3.9 mmol/L (ref 3.5–5.1)
Sodium: 140 mmol/L (ref 135–145)

## 2019-11-29 LAB — CBC
HCT: 48.5 % — ABNORMAL HIGH (ref 36.0–46.0)
Hemoglobin: 15.3 g/dL — ABNORMAL HIGH (ref 12.0–15.0)
MCH: 29 pg (ref 26.0–34.0)
MCHC: 31.5 g/dL (ref 30.0–36.0)
MCV: 92 fL (ref 80.0–100.0)
Platelets: 222 10*3/uL (ref 150–400)
RBC: 5.27 MIL/uL — ABNORMAL HIGH (ref 3.87–5.11)
RDW: 14.3 % (ref 11.5–15.5)
WBC: 6.7 10*3/uL (ref 4.0–10.5)
nRBC: 0 % (ref 0.0–0.2)

## 2019-11-29 SURGERY — URETEROSCOPY, WITH LITHOTRIPSY USING HOLMIUM LASER
Anesthesia: General | Laterality: Left

## 2019-11-29 MED ORDER — DEXAMETHASONE SODIUM PHOSPHATE 10 MG/ML IJ SOLN
INTRAMUSCULAR | Status: DC | PRN
  Administered 2019-11-29: 10 mg via INTRAVENOUS

## 2019-11-29 MED ORDER — PROPOFOL 10 MG/ML IV BOLUS
INTRAVENOUS | Status: AC
  Filled 2019-11-29: qty 20

## 2019-11-29 MED ORDER — PROPOFOL 10 MG/ML IV BOLUS
INTRAVENOUS | Status: DC | PRN
  Administered 2019-11-29: 170 mg via INTRAVENOUS

## 2019-11-29 MED ORDER — LACTATED RINGERS IV SOLN: INTRAVENOUS | Status: DC

## 2019-11-29 MED ORDER — MIDAZOLAM HCL 5 MG/5ML IJ SOLN
INTRAMUSCULAR | Status: DC | PRN
  Administered 2019-11-29: 2 mg via INTRAVENOUS

## 2019-11-29 MED ORDER — LIDOCAINE 2% (20 MG/ML) 5 ML SYRINGE
INTRAMUSCULAR | Status: DC | PRN
  Administered 2019-11-29: 100 mg via INTRAVENOUS

## 2019-11-29 MED ORDER — EPHEDRINE 5 MG/ML INJ
INTRAVENOUS | Status: AC
  Filled 2019-11-29: qty 10

## 2019-11-29 MED ORDER — OXYCODONE HCL 5 MG PO TABS: 5.0000 mg | ORAL_TABLET | Freq: Once | ORAL | Status: DC | PRN

## 2019-11-29 MED ORDER — CHLORHEXIDINE GLUCONATE 0.12 % MT SOLN
15.0000 mL | Freq: Once | OROMUCOSAL | Status: AC
  Administered 2019-11-29: 15 mL via OROMUCOSAL

## 2019-11-29 MED ORDER — SODIUM CHLORIDE 0.9 % IR SOLN
Status: DC | PRN
  Administered 2019-11-29: 3000 mL via INTRAVESICAL

## 2019-11-29 MED ORDER — ONDANSETRON HCL 4 MG/2ML IJ SOLN
INTRAMUSCULAR | Status: AC
  Filled 2019-11-29: qty 2

## 2019-11-29 MED ORDER — FENTANYL CITRATE (PF) 100 MCG/2ML IJ SOLN: 25.0000 ug | INTRAMUSCULAR | Status: DC | PRN

## 2019-11-29 MED ORDER — EPHEDRINE SULFATE-NACL 50-0.9 MG/10ML-% IV SOSY
PREFILLED_SYRINGE | INTRAVENOUS | Status: DC | PRN
  Administered 2019-11-29 (×3): 10 mg via INTRAVENOUS

## 2019-11-29 MED ORDER — PHENYLEPHRINE 40 MCG/ML (10ML) SYRINGE FOR IV PUSH (FOR BLOOD PRESSURE SUPPORT)
PREFILLED_SYRINGE | INTRAVENOUS | Status: AC
  Filled 2019-11-29: qty 10

## 2019-11-29 MED ORDER — LIDOCAINE 2% (20 MG/ML) 5 ML SYRINGE
INTRAMUSCULAR | Status: AC
  Filled 2019-11-29: qty 5

## 2019-11-29 MED ORDER — OXYCODONE HCL 5 MG/5ML PO SOLN: 5.0000 mg | Freq: Once | ORAL | Status: DC | PRN

## 2019-11-29 MED ORDER — PHENYLEPHRINE 40 MCG/ML (10ML) SYRINGE FOR IV PUSH (FOR BLOOD PRESSURE SUPPORT)
PREFILLED_SYRINGE | INTRAVENOUS | Status: DC | PRN
  Administered 2019-11-29 (×4): 120 ug via INTRAVENOUS

## 2019-11-29 MED ORDER — MIDAZOLAM HCL 2 MG/2ML IJ SOLN
INTRAMUSCULAR | Status: AC
  Filled 2019-11-29: qty 2

## 2019-11-29 MED ORDER — CIPROFLOXACIN IN D5W 400 MG/200ML IV SOLN
400.0000 mg | Freq: Once | INTRAVENOUS | Status: AC
  Administered 2019-11-29: 400 mg via INTRAVENOUS
  Filled 2019-11-29: qty 200

## 2019-11-29 MED ORDER — PROMETHAZINE HCL 25 MG/ML IJ SOLN: 6.2500 mg | INTRAMUSCULAR | Status: DC | PRN

## 2019-11-29 MED ORDER — DEXAMETHASONE SODIUM PHOSPHATE 10 MG/ML IJ SOLN
INTRAMUSCULAR | Status: AC
  Filled 2019-11-29: qty 1

## 2019-11-29 MED ORDER — ORAL CARE MOUTH RINSE: 15.0000 mL | Freq: Once | OROMUCOSAL | Status: AC

## 2019-11-29 MED ORDER — ONDANSETRON HCL 4 MG/2ML IJ SOLN
INTRAMUSCULAR | Status: DC | PRN
  Administered 2019-11-29: 4 mg via INTRAVENOUS

## 2019-11-29 MED ORDER — FENTANYL CITRATE (PF) 100 MCG/2ML IJ SOLN
INTRAMUSCULAR | Status: DC | PRN
  Administered 2019-11-29 (×2): 50 ug via INTRAVENOUS

## 2019-11-29 MED ORDER — FENTANYL CITRATE (PF) 100 MCG/2ML IJ SOLN
INTRAMUSCULAR | Status: AC
  Filled 2019-11-29: qty 2

## 2019-11-29 SURGICAL SUPPLY — 21 items
BAG URO CATCHER STRL LF (MISCELLANEOUS) ×2 IMPLANT
BASKET ZERO TIP NITINOL 2.4FR (BASKET) IMPLANT
BSKT STON RTRVL ZERO TP 2.4FR (BASKET)
CATH INTERMIT  6FR 70CM (CATHETERS) ×2 IMPLANT
CATH URET 5FR 28IN CONE TIP (BALLOONS)
CATH URET 5FR 70CM CONE TIP (BALLOONS) IMPLANT
CLOTH BEACON ORANGE TIMEOUT ST (SAFETY) ×2 IMPLANT
GLOVE BIO SURGEON STRL SZ7.5 (GLOVE) ×2 IMPLANT
GOWN STRL REUS W/TWL XL LVL3 (GOWN DISPOSABLE) ×2 IMPLANT
GUIDEWIRE STR DUAL SENSOR (WIRE) ×2 IMPLANT
KIT TURNOVER KIT A (KITS) IMPLANT
LASER FIB FLEXIVA PULSE ID 365 (Laser) IMPLANT
MANIFOLD NEPTUNE II (INSTRUMENTS) ×2 IMPLANT
PACK CYSTO (CUSTOM PROCEDURE TRAY) ×2 IMPLANT
SHEATH URETERAL 12FRX28CM (UROLOGICAL SUPPLIES) IMPLANT
SHEATH URETERAL 12FRX35CM (MISCELLANEOUS) IMPLANT
STENT URET 6FRX24 CONTOUR (STENTS) ×2 IMPLANT
TRACTIP FLEXIVA PULS ID 200XHI (Laser) ×1 IMPLANT
TRACTIP FLEXIVA PULSE ID 200 (Laser) ×2
TUBING CONNECTING 10 (TUBING) ×2 IMPLANT
TUBING UROLOGY SET (TUBING) ×2 IMPLANT

## 2019-11-29 NOTE — Transfer of Care (Signed)
Immediate Anesthesia Transfer of Care Note  Patient: Tammy Eaton  Procedure(s) Performed: CYSTOSCOPY; LEFT URETEROSCOPY; HOLMIUM LASER LITHOTRIPSY; LEFT URETERAL STENT EXCHANGE (Left )  Patient Location: PACU  Anesthesia Type:General  Level of Consciousness: drowsy, patient cooperative and responds to stimulation  Airway & Oxygen Therapy: Patient Spontanous Breathing and Patient connected to face mask oxygen  Post-op Assessment: Report given to RN and Post -op Vital signs reviewed and stable  Post vital signs: Reviewed and stable  Last Vitals:  Vitals Value Taken Time  BP 108/48 11/29/19 1523  Temp 35.9 C 11/29/19 1523  Pulse 77 11/29/19 1525  Resp 15 11/29/19 1525  SpO2 100 % 11/29/19 1525  Vitals shown include unvalidated device data.  Last Pain:  Vitals:   11/29/19 1251  TempSrc: Oral         Complications: No complications documented.

## 2019-11-29 NOTE — Anesthesia Procedure Notes (Signed)
Procedure Name: LMA Insertion Date/Time: 11/29/2019 2:42 PM Performed by: Silas [place], CRNA Pre-anesthesia Checklist: Patient identified, Emergency Drugs available, Suction available and Patient being monitored Patient Re-evaluated:Patient Re-evaluated prior to induction Oxygen Delivery Method: Circle system utilized Preoxygenation: Pre-oxygenation with 100% oxygen Induction Type: IV induction LMA: LMA inserted LMA Size: 4.0 Tube type: Oral Number of attempts: 1 Airway Equipment and Method: Oral airway Placement Confirmation: positive ETCO2 and breath sounds checked- equal and bilateral Tube secured with: Tape Dental Injury: Teeth and Oropharynx as per pre-operative assessment

## 2019-11-29 NOTE — Op Note (Signed)
Preoperative diagnosis: Left distal ureteral stones Postoperative diagnosis: Same  Procedure: Cystoscopy, left ureteroscopy with laser lithotripsy, left ureteral stent exchange  Surgeon: Junious Silk  Anesthesia: General  Indication for procedure: Tammy Eaton is a 69 year old female developed left flank pain and she underwent an urgent stent and had of concern for UTI.  She was treated with antibiotics and stayed on a nightly antibiotic.  She presents today for definitive left ureteroscopy.  Findings: The bladder was unremarkable.  In the left ureter there were 3 stones with the larger one at the UVJ and the 2 smaller ones proximal.  They were dusted and as the scope was withdrawn from the ureter the fragments follow the scope and washed out into the bladder.  Basket extraction was not needed.  Description of procedure: After consent obtained patient was brought to the operating room and after adequate anesthesia she was placed lithotomy position and prepped and draped in the usual sterile fashion.  A timeout was performed to confirm the patient and procedure.  The cystoscope was passed per urethra and the bladder inspected.  The left ureteral stent was grasped and removed through the urethral meatus and a sensor wire advanced and coiled in the upper collecting system.  Adjacent to the wire a semirigid ureteroscope was advanced where the stone was noted and a 200 m laser fiber deployed with the stone dusted at a setting of 0.2 and 70.  As the scope was withdrawn back into the bladder the fragments followed the scope and washed out into the bladder.  I looked back in and then dusted the 2 proximal stones and these fragments also followed the scope out and washed out.  I was unable to perform ureteroscopy all the way up to the left UPJ without difficulty and no other stones were noted.  No ureteral injury.  I then backloaded the wire on the cystoscope and a 624 cm stent advanced.  The wire was removed with a  good coil seen in the kidney and a good coil in the bladder.  The bladder was drained and the scope removed.  She was awakened and taken recovery room in stable condition.  Complications: None  Blood loss: Minimal  Specimens: None  Drains: 6 x 24 cm left ureteral stent with string  Disposition: Patient stable to PACU

## 2019-11-29 NOTE — Discharge Instructions (Signed)
Ureteral Stent Implantation, Care After This sheet gives you information about how to care for yourself after your procedure. Your health care provider may also give you more specific instructions. If you have problems or questions, contact your health care provider.  Removal of the stent: Remove the stent by pulling the string with slow steady pressure on Friday morning, December 02, 2019.  What can I expect after the procedure? After the procedure, it is common to have:  Nausea.  Mild pain when you urinate. You may feel this pain in your lower back or lower abdomen. The pain should stop within a few minutes after you urinate. This may last for up to 1 week.  A small amount of blood in your urine for several days. Follow these instructions at home: Medicines  Take over-the-counter and prescription medicines only as told by your health care provider.  If you were prescribed an antibiotic medicine, take it as told by your health care provider. Do not stop taking the antibiotic even if you start to feel better.  Do not drive for 24 hours if you were given a sedative during your procedure.  Ask your health care provider if the medicine prescribed to you requires you to avoid driving or using heavy machinery. Activity  Rest as told by your health care provider.  Avoid sitting for a long time without moving. Get up to take short walks every 1-2 hours. This is important to improve blood flow and breathing. Ask for help if you feel weak or unsteady.  Return to your normal activities as told by your health care provider. Ask your health care provider what activities are safe for you. General instructions   Watch for any blood in your urine. Call your health care provider if the amount of blood in your urine increases.  If you have a catheter: ? Follow instructions from your health care provider about taking care of your catheter and collection bag. ? Do not take baths, swim, or use a hot  tub until your health care provider approves. Ask your health care provider if you may take showers. You may only be allowed to take sponge baths.  Drink enough fluid to keep your urine pale yellow.  Do not use any products that contain nicotine or tobacco, such as cigarettes, e-cigarettes, and chewing tobacco. These can delay healing after surgery. If you need help quitting, ask your health care provider.  Keep all follow-up visits as told by your health care provider. This is important. Contact a health care provider if:  You have pain that gets worse or does not get better with medicine, especially pain when you urinate.  You have difficulty urinating.  You feel nauseous or you vomit repeatedly during a period of more than 2 days after the procedure. Get help right away if:  Your urine is dark red or has blood clots in it.  You are leaking urine (have incontinence).  The end of the stent comes out of your urethra.  You cannot urinate.  You have sudden, sharp, or severe pain in your abdomen or lower back.  You have a fever.  You have swelling or pain in your legs.  You have difficulty breathing. Summary  After the procedure, it is common to have mild pain when you urinate that goes away within a few minutes after you urinate. This may last for up to 1 week.  Watch for any blood in your urine. Call your health care provider if the  amount of blood in your urine increases.  Take over-the-counter and prescription medicines only as told by your health care provider.  Drink enough fluid to keep your urine pale yellow. This information is not intended to replace advice given to you by your health care provider. Make sure you discuss any questions you have with your health care provider. Document Revised: 10/27/2017 Document Reviewed: 10/28/2017 Elsevier Patient Education  2020 [employer].

## 2019-11-29 NOTE — Anesthesia Preprocedure Evaluation (Addendum)
Anesthesia Evaluation  Patient identified by MRN, date of birth, ID band Patient awake    Reviewed: Allergy & Precautions, NPO status , Patient's Chart, lab work & pertinent test results  History of Anesthesia Complications (+) PONV and history of anesthetic complications  Airway Mallampati: II  TM Distance: >3 FB Neck ROM: Full    Dental no notable dental hx. (+) Dental Advisory Given   Pulmonary neg pulmonary ROS, COPD,  COPD inhaler, former smoker,   Hx lung cancer s/p Left upper lobectomy    Pulmonary exam normal breath sounds clear to auscultation       Cardiovascular Normal cardiovascular exam Rhythm:Regular Rate:Normal   '20 Myoperfusion - The left ventricular ejection fraction is hyperdynamic (>65%). Nuclear stress EF: 66%. There was no ST segment deviation noted during stress. The study is normal. This is a low risk study.  '20 TTE - EF 55 to 60%. Grade I diastolic dysfunction (impaired relaxation). Trivial MR, TR, PR, and AI. Mild aortic valve sclerosis without stenosis.     Neuro/Psych PSYCHIATRIC DISORDERS Anxiety Depression negative neurological ROS  negative psych ROS   GI/Hepatic negative GI ROS, Neg liver ROS,   Endo/Other  negative endocrine ROS  Renal/GU Renal disease (kidney stones)  negative genitourinary   Musculoskeletal negative musculoskeletal ROS (+) Arthritis , Osteoarthritis,   Scoliosis DDD   Abdominal Normal abdominal exam  (+)   Peds negative pediatric ROS (+)  Hematology negative hematology ROS (+)   Anesthesia Other Findings Hx vocal cord surgery   Reproductive/Obstetrics negative OB ROS                            Anesthesia Physical  Anesthesia Plan  ASA: II  Anesthesia Plan: General   Post-op Pain Management:    Induction: Intravenous  PONV Risk Score and Plan: 4 or greater and Treatment may vary due to age or medical condition,  Ondansetron, Dexamethasone and Midazolam  Airway Management Planned: LMA  Additional Equipment: None  Intra-op Plan:   Post-operative Plan: Extubation in OR  Informed Consent:   Plan Discussed with: CRNA and Surgeon  Anesthesia Plan Comments:        Anesthesia Quick Evaluation

## 2019-11-29 NOTE — Anesthesia Postprocedure Evaluation (Signed)
Anesthesia Post Note  Patient: Tammy Eaton  Procedure(s) Performed: CYSTOSCOPY; LEFT URETEROSCOPY; HOLMIUM LASER LITHOTRIPSY; LEFT URETERAL STENT EXCHANGE (Left )     Patient location during evaluation: PACU Anesthesia Type: General Level of consciousness: awake and alert Pain management: pain level controlled Vital Signs Assessment: post-procedure vital signs reviewed and stable Respiratory status: spontaneous breathing, nonlabored ventilation, respiratory function stable and patient connected to nasal cannula oxygen Cardiovascular status: blood pressure returned to baseline and stable Postop Assessment: no apparent nausea or vomiting Anesthetic complications: no   No complications documented.  Last Vitals:  Vitals:   11/29/19 1545 11/29/19 1615  BP: (!) 104/55 (!) 109/57  Pulse: 83 80  Resp: 13 16  Temp: (!) 36.3 C (!) 36.3 C  SpO2: 97% 94%    Last Pain:  Vitals:   11/29/19 1615  TempSrc:   PainSc: 0-No pain                 [name] S

## 2019-11-29 NOTE — Interval H&P Note (Signed)
History and Physical Interval Note:  11/29/2019 2:27 PM  Tammy Eaton  has presented today for surgery, with the diagnosis of LEFT DISTAL URETERAL STONE.  The various methods of treatment have been discussed with the patient and family. After consideration of risks, benefits and other options for treatment, the patient has consented to  Procedure(s) with comments: URETEROSCOPY WITH HOLMIUM LASER LITHOTRIPSY/ RETROGRADE / STENT PLACEMENT (Left) - ONLY NEEDS 60 MIN as a surgical intervention.  She is well. No fever. On a nightly abx. Urine is clear - no cloudiness or blood. The patient's history has been reviewed, patient examined, no change in status, stable for surgery.  I have reviewed the patient's chart and labs.  Questions were answered to the patient's satisfaction.     Festus Aloe

## 2019-11-30 ENCOUNTER — Encounter (HOSPITAL_COMMUNITY): Payer: Self-pay | Admitting: Urology

## 2019-12-02 ENCOUNTER — Encounter: Payer: Self-pay | Admitting: Pulmonary Disease

## 2019-12-02 ENCOUNTER — Other Ambulatory Visit: Payer: Self-pay

## 2019-12-02 ENCOUNTER — Ambulatory Visit: Payer: Medicare Other | Admitting: Pulmonary Disease

## 2019-12-02 VITALS — BP 100/62 | HR 82 | Temp 97.3°F | Ht 65.5 in | Wt 142.4 lb

## 2019-12-02 DIAGNOSIS — J449 Chronic obstructive pulmonary disease, unspecified: Secondary | ICD-10-CM

## 2019-12-02 MED ORDER — FLUTICASONE-SALMETEROL 250-50 MCG/DOSE IN AEPB: 1.0000 | INHALATION_SPRAY | Freq: Two times a day (BID) | RESPIRATORY_TRACT | 12 refills | Status: DC

## 2019-12-02 NOTE — Progress Notes (Signed)
Subjective:   PATIENT ID: Tammy Eaton GENDER: female DOB: 1950/03/02, MRN: [id number]   HPI  Chief Complaint  Patient presents with  . Follow-up    Patient feels like she is doing better since her last visit. Is not coughing as much but still has it, feels like its deeper than before.    Reason for Visit: Follow-up  Tammy Eaton is a 69 year old female with COPD, history of lung nodule, hx of lung cancer in 2004, anxiety/depression and GERD who presents for follow-up.   Synopsis: Diagnosed with COPD 15 years ago. She was only started bronchodilators [facility]) five years ago. Denies COPD exacerbations requiring ED or hospitalizations. She rarely gets treated for bronchitis or exacerbations as an outpatient. She quit smoking 2010.   Since our last visit on 10/26/19. COPD exacerbation has resolved. She was trialed on Trelegy and felt that it improved her cough but has had worse chest congestion that made her cough more productive and made it more difficult to get it up. Dyspnea occurs with strenuous activity otherwise no shortness of breath with walking or activity. She prefers to switch by to [employer]. She also started Flonase which she feels helped her sinus pressure/congestion. She has had a kidney-bladder stent placed due to kidney stone and having urinary irritation.   Prior inhalers: Spiriva - thrush Trelegy - chest congestion Breo - insurance  Social History: Former smoker. 80 pack years. Quit in 2010.  I have personally reviewed patient's past medical/family/social history/allergies/current medications.  Past Medical History:  Diagnosis Date  . Anxiety   . Arthritis 2018  . Bulging lumbar disc 2018  . Complication of anesthesia    "low blood pressure after retinal detachment surgery"  . COPD (chronic obstructive pulmonary disease) ([place])   . Degenerative disc disease, lumbar 2018  . Depression   . Emphysema of lung ([place])   . History of bronchitis   . History of  pneumonia    "multiple times"  . Hyperlipidemia   . Hyperlipidemia   . Kidney stones 02/2016  . lung ca 05/2003  . Lung cancer ([place])    s/p LUL lobectomy  . Osteoarthritis    spine, knees, hands  . PONV (postoperative nausea and vomiting)    pt "could not eat during hospital stay"  . Reflux esophagitis   . Rhinitis   . Scoliosis 2018  . Shortness of breath   . Wears glasses       Outpatient Medications Prior to Visit  Medication Sig Dispense Refill  . acetaminophen-codeine (TYLENOL #3) 300-30 MG tablet Take 1 tablet by mouth every 8 (eight) hours as needed for moderate pain.     . baclofen (LIORESAL) 10 MG tablet Take 10 mg by mouth 2 (two) times daily as needed for muscle spasms.     . celecoxib (CELEBREX) 200 MG capsule Take 200 mg by mouth daily. Pt has discontinued    . clonazePAM (KLONOPIN) 1 MG tablet Take 1 mg by mouth at bedtime.    . fluticasone (FLONASE) 50 MCG/ACT nasal spray Place 2 sprays into both nostrils daily.    . Fluticasone-Umeclidin-Vilant (TRELEGY ELLIPTA) 200-62.5-25 MCG/INH AEPB Inhale 1 puff into the lungs daily. 28 each 0  . LINZESS 72 MCG capsule Take 72 mcg by mouth daily as needed (constipation).     . Omega-3 Fatty Acids (OMEGA 3 PO) Take 1 capsule by mouth 2 (two) times daily.    . polyvinyl alcohol (LIQUIFILM TEARS) 1.4 % ophthalmic solution  Place 1 drop into both eyes as needed for dry eyes.    . traMADol (ULTRAM) 50 MG tablet Take by mouth every 6 (six) hours as needed.    [place] Ruts INHUB 100-50 MCG/DOSE AEPB TAKE 1 PUFF BY MOUTH TWICE A DAY 180 each 0  . rosuvastatin (CRESTOR) 5 MG tablet Take 5 mg by mouth 4 (four) times a week. Monday Tuesday Wednesday Thursday (Patient not taking: Reported on 12/02/2019)    . ciprofloxacin (CIPRO) 100 MG tablet Take 100 mg by mouth 2 (two) times daily. Pt has completed after surgery of 11/16/2019    . CYMBALTA 60 MG capsule Take 60 mg by mouth daily. Last dose on 11/18/2019 approx per pt    .  HYDROcodone-acetaminophen (NORCO/VICODIN) 5-325 MG tablet Take 1 tablet by mouth every 6 (six) hours as needed for pain.    . nitrofurantoin, macrocrystal-monohydrate, (MACROBID) 100 MG capsule Take 100 mg by mouth 2 (two) times daily. 7 day supply    . tamsulosin (FLOMAX) 0.4 MG CAPS capsule Take 0.4 mg by mouth daily. Pt completed at same time as cipro     No facility-administered medications prior to visit.    Review of Systems  Constitutional: Negative for chills, diaphoresis, fever, malaise/fatigue and weight loss.  HENT: Positive for congestion.   Respiratory: Positive for cough. Negative for hemoptysis, sputum production, shortness of breath and wheezing.   Cardiovascular: Negative for chest pain, palpitations and leg swelling.     Objective:   Vitals:   12/02/19 1110  BP: 100/62  Pulse: 82  Temp: (!) 97.3 F (36.3 C)  TempSrc: Temporal  SpO2: 93%  Weight: 142 lb 6.4 oz (64.6 kg)  Height: 5' 5.5" (1.664 m)   SpO2: 93 % O2 Device: None (Room air)  Physical Exam: General: Well-appearing, no acute distress HENT: [place], AT Eyes: EOMI, no scleral icterus Respiratory: Clear to auscultation bilaterally.  No crackles, wheezing or rales Cardiovascular: RRR, -M/R/G, no JVD Extremities:-Edema,-tenderness Neuro: AAO x4, CNII-XII grossly intact Skin: Intact, no rashes or bruising Psych: Normal mood, normal affect  Data Reviewed:  Imaging: CT chest 03/26/2018-status post left upper lobe lobectomy with left mediastinal shift.  Stable right upper lobe and left upper lobe nodules.  Very severe centrilobular emphysema in the background  CT 03/28/19 - Unchanged compared to 03/26/18 exam with stable nodules.   PFT: 11/28/2014 FVC 3.68 (109%) FEV1 1.87 (72%) Ratio 51 TLC 121% DLCO 43% Interpretation: Mild obstructive defect with moderately reduced DLCO.  No significant bronchodilator response.  No air trapping.  04/11/19 FVC 3.06 (93%) FEV1 1.65 (66%) Ratio 53  TLC 116% DLCO  49% Interpretation: Moderate obstructive defect with air trapping and reduced DLCO consistent with emphysema. Worsened air obstruction compared to 2016.  Imaging, labs and test noted above have been reviewed independently by me.    Assessment & Plan:   Discussion: 69 year old female with COPD, history of lung nodule, hx of lung cancer in 2004, anxiety/depression who presents for follow-up. We discussed bronchodilator management and plan.   Moderate COPD with emphysema and bronchitis, not inexacerbation FINISH Wixela 100-65mcg TWO puffs TWICE a day START Wixela 200-40mcg ONE puff TWICE a day CONTINUE Albuterol as needed for shortness of breath CONTINUE Flonase 1 spray per nare daily  History of stage Ia lung cancer status post left upper lobe lobectomy in 2005 Right upper lobe nodule status post nondiagnostic navigational bronchoscopy in 2016 Recent nodules have been stable x 2 years. Likely benign Following CT surgery with Dr.  [place] Maintenance Immunization History  Administered Date(s) Administered  . Fluad Quad(high Dose 65+) 10/26/2019  . Influenza Split 11/04/2014  . Influenza, High Dose Seasonal PF 10/25/2016, 10/19/2017, 09/23/2018  . Influenza,inj,Quad PF,6+ Mos 10/30/2015  . PFIZER SARS-COV-2 Vaccination 03/11/2019, 03/31/2019  . Pneumococcal Conjugate-13 02/22/2015  . Tdap 02/04/2011  . Zoster 02/04/2011  . Zoster Recombinat (Shingrix) 01/17/2018, 04/12/2018   CT Lung Screen - not qualified. Hx lung cancer  No orders of the defined types were placed in this encounter.  Meds ordered this encounter  Medications  . Fluticasone-Salmeterol (WIXELA INHUB) 250-50 MCG/DOSE AEPB    Sig: Inhale 1 puff into the lungs in the morning and at bedtime.    Dispense:  60 each    Refill:  12   Return in about 3 months (around 03/03/2020).   I have spent a total time of 32-minutes on the day of the appointment reviewing prior documentation, coordinating care and  discussing medical diagnosis and plan with the patient/family. Imaging, labs and tests included in this note have been reviewed and interpreted independently by me.  [place], MD [facility] 12/02/2019 11:18 AM  Office Number [phone]

## 2019-12-02 NOTE — Patient Instructions (Addendum)
Moderate COPD with emphysema and bronchitis, not inexacerbation STOP Trelegy FINISH Wixela 100-12mcg TWO puffs TWICE a day START Wixela 200-34mcg ONE puff TWICE a day CONTINUE Albuterol as needed for shortness of breath CONTINUE Flonase 1 spray per nare daily  Follow-up in 3 months with me

## 2020-02-08 ENCOUNTER — Ambulatory Visit: Payer: Medicare Other | Admitting: Obstetrics & Gynecology

## 2020-02-08 ENCOUNTER — Encounter: Payer: Self-pay | Admitting: Obstetrics & Gynecology

## 2020-02-08 ENCOUNTER — Other Ambulatory Visit: Payer: Self-pay

## 2020-02-08 VITALS — BP 110/60 | HR 89 | Ht 65.0 in | Wt 140.0 lb

## 2020-02-08 DIAGNOSIS — C3412 Malignant neoplasm of upper lobe, left bronchus or lung: Secondary | ICD-10-CM

## 2020-02-08 DIAGNOSIS — N814 Uterovaginal prolapse, unspecified: Secondary | ICD-10-CM

## 2020-02-08 NOTE — Progress Notes (Unsigned)
Patient would like to discuss hysterectomy. Patient states her uterus "hangs out".  Kathrene Alu RN

## 2020-02-08 NOTE — Progress Notes (Signed)
GYNECOLOGY  VISIT  CC:   prolapse  HPI: 70 y.o. G36P2002 Married White or Caucasian female here for discussion of surgery for prolapse.  Pt was seen in the summer/fall regarding this issue.  She did see Dr. Quincy Simmonds for consultation.  Had kidney stones and pylenophritis in October so any surgical plans were put on hold.  Has cystoscopy with stone removal and stent placement.  Felt this was just the worst experience ever.  Care was great but the stent made her feel like she had a UTI all of the time.    Seeing Dr. Michail Sermon next week for consultation about colonoscopy.  Last one was 2016.  She was due on 2021.  We discussed this at prior visit.  She decided to call and check about this as she hadn't heard anything from [facility].  Was advised letter was sent in the summer which she didn't receive.  Is planning on having this done late winter.  Ready to proceed with surgical correction of prolapse.  Has central prolapse without significant cystocele or rectocele on last exam.  Does not ever leak urine.  We discussed involving urogynecology and she is ready to proceed.  Aware I will likely be helping with surgery as well.  GYNECOLOGIC HISTORY: Patient's last menstrual period was 09/26/2013 (exact date). Contraception: PMP Menopausal hormone therapy: none  Patient Active Problem List   Diagnosis Date Noted  . COPD with acute exacerbation ([place]) 10/29/2019  . Chronic cough 04/11/2019  . Moderate COPD (chronic obstructive pulmonary disease) ([place]) 12/25/2014  . Nodule of right lung 12/11/2014  . 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 Primary cancer of left upper lobe of lung ([place]) 11/27/2011  . Symptomatic cholelithiasis 09/22/2011    Past Medical History:  Diagnosis Date  . Anxiety   . Arthritis 2018  . Bulging lumbar disc 2018  . Complication of anesthesia    "low blood pressure after retinal detachment surgery"  . COPD (chronic obstructive pulmonary disease) ([place])   . Degenerative disc  disease, lumbar 2018  . Depression   . Emphysema of lung ([place])   . History of bronchitis   . History of pneumonia    "multiple times"  . Hyperlipidemia   . Hyperlipidemia   . Kidney stones 02/2016  . lung ca 05/2003  . Lung cancer ([place])    s/p LUL lobectomy  . Osteoarthritis    spine, knees, hands  . PONV (postoperative nausea and vomiting)    pt "could not eat during hospital stay"  . Reflux esophagitis   . Rhinitis   . Scoliosis 2018  . Shortness of breath   . Wears glasses     Past Surgical History:  Procedure Laterality Date  . BREAST EXCISIONAL BIOPSY    . BREAST LUMPECTOMY WITH NEEDLE LOCALIZATION Right 04/11/2013   Procedure: BREAST LUMPECTOMY WITH NEEDLE LOCALIZATION;  Surgeon: Odis Hollingshead, MD;  Location: [place];  Service: General;  Laterality: Right;  . BREAST SURGERY  1992   lt br bx  . CHOLECYSTECTOMY  11/11/2011   Procedure: LAPAROSCOPIC CHOLECYSTECTOMY WITH INTRAOPERATIVE CHOLANGIOGRAM;  Surgeon: Odis Hollingshead, MD;  Location: [place];  Service: General;  Laterality: N/A;  . COLONOSCOPY    . CYSTOSCOPY  12/10   for evaluation of hematuria  . CYSTOSCOPY W/ URETERAL STENT PLACEMENT Left 11/16/2019   Procedure: CYSTOSCOPY WITH STENT REPLACEMENT;  Surgeon: Raynelle Bring, MD;  [facility];  Service: Urology;  Laterality: Left;  . DILATION AND CURETTAGE  OF UTERUS    . ESOPHAGOGASTRODUODENOSCOPY    . EYE SURGERY     catataracts-both  . KNEE SURGERY     rt knee torn cartilage  . LUNG LOBECTOMY Left 4/05  . RETINAL DETACHMENT SURGERY  2011   left; x2  . SINUS SURGERY WITH INSTATRAK    . TUBAL LIGATION    . URETEROSCOPY WITH HOLMIUM LASER LITHOTRIPSY Left 11/29/2019   Procedure: CYSTOSCOPY; LEFT URETEROSCOPY; HOLMIUM LASER LITHOTRIPSY; LEFT URETERAL STENT EXCHANGE;  Surgeon: Festus Aloe, MD;  [facility];  Service: Urology;  Laterality: Left;  ONLY NEEDS 60 MIN  . VIDEO BRONCHOSCOPY WITH ENDOBRONCHIAL NAVIGATION N/A  01/15/2015   Procedure: VIDEO BRONCHOSCOPY WITH ENDOBRONCHIAL NAVIGATION;  Surgeon: [name], MD;  Location: [place];  Service: Thoracic;  Laterality: N/A;  . VOCAL CORD LATERALIZATION, ENDOSCOPIC APPROACH W/ MLB     nodule removed    MEDS:   Current Outpatient Medications on File Prior to Visit  Medication Sig Dispense Refill  . acetaminophen-codeine (TYLENOL #3) 300-30 MG tablet Take 1 tablet by mouth every 8 (eight) hours as needed for moderate pain.     . baclofen (LIORESAL) 10 MG tablet Take 10 mg by mouth 2 (two) times daily as needed for muscle spasms.     . celecoxib (CELEBREX) 200 MG capsule Take 200 mg by mouth daily. Pt has discontinued    . clonazePAM (KLONOPIN) 1 MG tablet Take 1 mg by mouth at bedtime.    . fluticasone (FLONASE) 50 MCG/ACT nasal spray Place 2 sprays into both nostrils daily.    . Fluticasone-Salmeterol (WIXELA INHUB) 250-50 MCG/DOSE AEPB Inhale 1 puff into the lungs in the morning and at bedtime. 60 each 12  . LINZESS 72 MCG capsule Take 72 mcg by mouth daily as needed (constipation).     . Omega-3 Fatty Acids (OMEGA 3 PO) Take 1 capsule by mouth 2 (two) times daily.    . polyvinyl alcohol (LIQUIFILM TEARS) 1.4 % ophthalmic solution Place 1 drop into both eyes as needed for dry eyes.    . rosuvastatin (CRESTOR) 5 MG tablet Take 5 mg by mouth 4 (four) times a week. Monday Tuesday Wednesday Thursday (Patient not taking: Reported on 12/02/2019)    . traMADol (ULTRAM) 50 MG tablet Take by mouth every 6 (six) hours as needed.     No current facility-administered medications on file prior to visit.    ALLERGIES: Adhesive [tape], Septra [sulfamethoxazole-trimethoprim], Tramadol hcl, and Amoxicillin  Family History  Problem Relation Age of Onset  . Cancer Mother 34       ovarian cancer  . Hypertension Mother   . Diabetes Mother   . Alcohol abuse Father     SH:  Married, non smoker  Review of Systems  All other systems reviewed and are  negative.   PHYSICAL EXAMINATION:    BP 110/60   Pulse 89   Ht 5\' 5"  (1.651 m)   Wt 140 lb (63.5 kg)   LMP 09/26/2013 (Exact Date)   BMI 23.30 kg/m     General appearance: alert, cooperative and appears stated age No physical exam performed today  Assessment/Plan: 1. Uterine prolapse - Ambulatory referral to Urogynecology for surgical planning.  D/w pt urogynamics may also be needed as part of the pre-operative evaluation process.  2. Primary cancer of left upper lobe of lung ([place]) - will communicate with Dr. Loanne Drilling about any preoperative clearance/evaluation needed.  Pt did have pulmonary function tests in 04/2019.  28 minutes of total time was spent for this patient encounter, including preparation, face-to-face counseling with the patient and coordination of care, and documentation of the encounter.

## 2020-02-09 DIAGNOSIS — F325 Major depressive disorder, single episode, in full remission: Secondary | ICD-10-CM | POA: Insufficient documentation

## 2020-02-09 DIAGNOSIS — I700 Atherosclerosis of aorta: Secondary | ICD-10-CM | POA: Insufficient documentation

## 2020-02-09 DIAGNOSIS — K219 Gastro-esophageal reflux disease without esophagitis: Secondary | ICD-10-CM | POA: Insufficient documentation

## 2020-02-09 DIAGNOSIS — G4700 Insomnia, unspecified: Secondary | ICD-10-CM | POA: Insufficient documentation

## 2020-02-21 ENCOUNTER — Other Ambulatory Visit: Payer: Self-pay | Admitting: Thoracic Surgery (Cardiothoracic Vascular Surgery)

## 2020-02-21 DIAGNOSIS — R911 Solitary pulmonary nodule: Secondary | ICD-10-CM

## 2020-02-21 NOTE — Progress Notes (Deleted)
[facility] New Patient Evaluation and Consultation  Referring Provider: Megan Salon, MD PCP: Glenis Smoker, MD Date of Service: 02/24/2020  SUBJECTIVE Chief Complaint: No chief complaint on file.  History of Present Illness: Tammy Eaton is a 70 y.o. White or Caucasian female seen in consultation at the request of Dr. Sabra Heck for evaluation of prolapse.    Review of records significant for: Has a history of prolapse and interested in surgery. Cervix was noted at introitus on exam.   Urinary Symptoms: {urine leakage?:24754} Leaks *** time(s) per {days/wks/mos/yrs:310907}.  Pad use: {NUMBERS 1-10:18281} {pad option:24752} per day.   She {ACTION; IS/IS JKD:32671245} bothered by her UI symptoms.  Day time voids ***.  Nocturia: *** times per night to void. Voiding dysfunction: she {empties:24755} her bladder well.  {DOES NOT does:27190::"does not"} use a catheter to empty bladder.  When urinating, she feels {urine symptoms:24756} Drinks: *** per day  UTIs: {NUMBERS 1-10:18281} UTI's in the last year.   {ACTIONS;DENIES/REPORTS:21021675::"Denies"} history of {urologic concerns:24757}  Pelvic Organ Prolapse Symptoms:                  She {denies/ admits to:24761} a feeling of a bulge the vaginal area. It has been present for {NUMBER 1-10:22536} {days/wks/mos/yrs:310907}.  She {denies/ admits to:24761} seeing a bulge.  This bulge {ACTION; IS/IS YKD:98338250} bothersome.  Bowel Symptom: Bowel movements: *** time(s) per {Time; day/week/month:13537} Stool consistency: {stool consistency:24758} Straining: {yes/no:19897}.  Splinting: {yes/no:19897}.  Incomplete evacuation: {yes/no:19897}.  She {denies/ admits to:24761} accidental bowel leakage / fecal incontinence  Occurs: *** time(s) per {Time; day/week/month:13537}  Consistency with leakage: {stool consistency:24758} Bowel regimen: {bowel regimen:24759} Last colonoscopy: Date ***, Results ***  Sexual  Function Sexually active: {yes/no:19897}.  Sexual orientation: {Sexual Orientation:[phone]} Pain with sex: {pain with sex:24762}  Pelvic Pain {denies/ admits to:24761} pelvic pain Location: *** Pain occurs: *** Prior pain treatment: *** Improved by: *** Worsened by: ***   Past Medical History:  Past Medical History:  Diagnosis Date  . Anxiety   . Arthritis 2018  . Bulging lumbar disc 2018  . Complication of anesthesia    "low blood pressure after retinal detachment surgery"  . COPD (chronic obstructive pulmonary disease) ([place])   . Degenerative disc disease, lumbar 2018  . Depression   . Emphysema of lung ([place])   . History of bronchitis   . History of pneumonia    "multiple times"  . Hyperlipidemia   . Hyperlipidemia   . Kidney stones 02/2016  . lung ca 05/2003  . Lung cancer ([place])    s/p LUL lobectomy  . Osteoarthritis    spine, knees, hands  . PONV (postoperative nausea and vomiting)    pt "could not eat during hospital stay"  . Reflux esophagitis   . Rhinitis   . Scoliosis 2018  . Shortness of breath   . Wears glasses      Past Surgical History:   Past Surgical History:  Procedure Laterality Date  . BREAST EXCISIONAL BIOPSY    . BREAST LUMPECTOMY WITH NEEDLE LOCALIZATION Right 04/11/2013   Procedure: BREAST LUMPECTOMY WITH NEEDLE LOCALIZATION;  Surgeon: Odis Hollingshead, MD;  Location: [place];  Service: General;  Laterality: Right;  . BREAST SURGERY  1992   lt br bx  . CHOLECYSTECTOMY  11/11/2011   Procedure: LAPAROSCOPIC CHOLECYSTECTOMY WITH INTRAOPERATIVE CHOLANGIOGRAM;  Surgeon: Odis Hollingshead, MD;  Location: [place];  Service: General;  Laterality: N/A;  . CYSTOSCOPY  12/10   for evaluation  of hematuria  . CYSTOSCOPY W/ URETERAL STENT PLACEMENT Left 11/16/2019   Procedure: CYSTOSCOPY WITH STENT REPLACEMENT;  Surgeon: Raynelle Bring, MD;  [facility];  Service: Urology;  Laterality: Left;  . DILATION AND CURETTAGE OF UTERUS     . ESOPHAGOGASTRODUODENOSCOPY    . EYE SURGERY     catataracts-both  . KNEE SURGERY     rt knee torn cartilage  . LUNG LOBECTOMY Left 4/05  . RETINAL DETACHMENT SURGERY  2011   left; x2  . SINUS SURGERY WITH INSTATRAK    . TUBAL LIGATION    . URETEROSCOPY WITH HOLMIUM LASER LITHOTRIPSY Left 11/29/2019   Procedure: CYSTOSCOPY; LEFT URETEROSCOPY; HOLMIUM LASER LITHOTRIPSY; LEFT URETERAL STENT EXCHANGE;  Surgeon: Festus Aloe, MD;  [facility];  Service: Urology;  Laterality: Left;  ONLY NEEDS 60 MIN  . VIDEO BRONCHOSCOPY WITH ENDOBRONCHIAL NAVIGATION N/A 01/15/2015   Procedure: VIDEO BRONCHOSCOPY WITH ENDOBRONCHIAL NAVIGATION;  Surgeon: [name], MD;  Location: [place];  Service: Thoracic;  Laterality: N/A;  . VOCAL CORD LATERALIZATION, ENDOSCOPIC APPROACH W/ MLB     nodule removed     Past OB/GYN History: G{NUMBERS 1-10:18281} P{NUMBERS 1-10:18281} Vaginal deliveries: ***,  Forceps/ Vacuum deliveries: ***, Cesarean section: *** Menopausal: {menopausal:24763} Contraception: ***. Last pap smear was ***.  Any history of abnormal pap smears: {yes/no:19897}.   Medications: She has a current medication list which includes the following prescription(s): acetaminophen-codeine, amitriptyline, baclofen, celecoxib, clonazepam, fluticasone, fluticasone-salmeterol, linzess, omega-3 fatty acids, polyvinyl alcohol, and tramadol.   Allergies: Patient is allergic to adhesive [tape], septra [sulfamethoxazole-trimethoprim], tramadol hcl, and amoxicillin.   Social History:  Social History   Tobacco Use  . Smoking status: Former Smoker    Packs/day: 2.00    Years: 45.00    Pack years: 90.00    Start date: 1965    Quit date: 09/21/2008    Years since quitting: 11.4  . Smokeless tobacco: Never Used  Vaping Use  . Vaping Use: Never used  Substance Use Topics  . Alcohol use: Yes    Alcohol/week: 0.0 - 1.0 standard drinks  . Drug use: No    Relationship status:  {relationship status:24764} She lives with ***.   She {ACTION; IS/IS BJY:78295621} employed ***. Regular exercise: {Yes/No:304960894} History of abuse: {Yes/No:304960894}  Family History:   Family History  Problem Relation Age of Onset  . Cancer Mother 19       ovarian cancer  . Hypertension Mother   . Diabetes Mother   . Alcohol abuse Father      Review of Systems: ROS   OBJECTIVE Physical Exam: There were no vitals filed for this visit.  Physical Exam   GU / Detailed Urogynecologic Evaluation:  Pelvic Exam: Normal external female genitalia; Bartholin's and Skene's glands normal in appearance; urethral meatus normal in appearance, no urethral masses or discharge.   CST: {gen negative/positive:315881}  Reflexes: bulbocavernosis {DESC; PRESENT/NOT PRESENT:21021351}, anocutaneous {DESC; PRESENT/NOT PRESENT:21021351} ***bilaterally.  Speculum exam reveals normal vaginal mucosa {With/Without:20273} atrophy. Cervix {exam; gyn cervix:30847}. Uterus {exam; pelvic uterus:30849}. Adnexa {exam; adnexa:12223}.    s/p hysterectomy: Speculum exam reveals normal vaginal mucosa {With/Without:20273}  atrophy and normal vaginal cuff.  Adnexa {exam; adnexa:12223}.    With apex supported, anterior compartment defect was {reduced:24765}  Pelvic floor strength {Roman # I-V:19040}/V, puborectalis {Roman # I-V:19040}/V external anal sphincter {Roman # I-V:19040}/V  Pelvic floor musculature: Right levator {Tender/Non-tender:20250}, Right obturator {Tender/Non-tender:20250}, Left levator {Tender/Non-tender:20250}, Left obturator {Tender/Non-tender:20250}  POP-Q:   POP-Q  Aa                                               Ba                                                 C                                                Gh                                               Pb                                               tvl                                                 Ap                                               Bp                                                 D     Rectal Exam:  Normal sphincter tone, {rectocele:24766} distal rectocele, enterocoele {DESC; PRESENT/NOT PRESENT:21021351}, no rectal masses, {sign of:24767} dyssynergia when asking the patient to bear down.  Post-Void Residual (PVR) by Bladder Scan: In order to evaluate bladder emptying, we discussed obtaining a postvoid residual and she agreed to this procedure.  Procedure: The ultrasound unit was placed on the patient's abdomen in the suprapubic region after the patient had voided. A PVR of *** ml was obtained by bladder scan.  Laboratory Results: @ENCLABS @   ***I visualized the urine specimen, noting the specimen to be {urine color:24768}  ASSESSMENT AND PLAN Ms. Basic is a 70 y.o. with: No diagnosis found.    Jaquita Folds, MD   Medical Decision Making:  - Reviewed/ ordered a clinical laboratory test - Reviewed/ ordered a radiologic study - Reviewed/ ordered medicine test - Decision to obtain old records - Discussion of management of or test interpretation with an external physician / other healthcare professional  - Assessment requiring independent historian - Review and summation of prior records - Independent review of image, tracing or specimen

## 2020-02-24 ENCOUNTER — Ambulatory Visit: Payer: Medicare Other | Admitting: Obstetrics and Gynecology

## 2020-02-29 ENCOUNTER — Other Ambulatory Visit: Payer: Self-pay

## 2020-02-29 ENCOUNTER — Ambulatory Visit: Payer: Medicare Other | Admitting: Pulmonary Disease

## 2020-02-29 ENCOUNTER — Encounter: Payer: Self-pay | Admitting: Pulmonary Disease

## 2020-02-29 VITALS — BP 118/68 | HR 88 | Temp 98.3°F | Ht 65.5 in | Wt 139.2 lb

## 2020-02-29 DIAGNOSIS — Z01818 Encounter for other preprocedural examination: Secondary | ICD-10-CM

## 2020-02-29 DIAGNOSIS — J449 Chronic obstructive pulmonary disease, unspecified: Secondary | ICD-10-CM | POA: Diagnosis not present

## 2020-02-29 NOTE — Patient Instructions (Signed)
Moderate COPD with emphysema and bronchitis - well controlled CONTINUE Wixela 200-61mcg ONE puff TWICE a day CONTINUE Albuterol as needed for shortness of breath CONTINUE Flonase 1 spray per nare daily  History of stage Ia lung cancer status post left upper lobe lobectomy in 2005 Right upper lobe nodule status post nondiagnostic navigational bronchoscopy in 2016 Recent nodules have been stable x 2 years. Likely benign Following CT surgery with Dr. Roxan Hockey  Surgery: Hysterectomy  Peri-operative Assessment of Pulmonary Risk for Non-Thoracic Surgery:  For Tammy Eaton, risk of perioperative pulmonary complications is increased by: Age greater than 58 years, COPD (well-controlled)   Respiratory complications generally occur in 1% of ASA Class I patients. These complications rarely result in mortality and include postoperative pneumonia, atelectasis, pulmonary embolism, ARDS and increased time requiring postoperative mechanical ventilation.  Overall, I recommend proceeding with the surgery if the risk for respiratory complications are outweighed by the potential benefits. This will need to be discussed between the patient and surgeon.  To reduce risks of respiratory complications, I recommend: --Pre- and post-operative incentive spirometry performed frequently while awake  I have discussed the risk factors and recommendations above with the patient.

## 2020-02-29 NOTE — Progress Notes (Signed)
Subjective:   PATIENT ID: Tammy Eaton GENDER: female DOB: 1950-06-14, MRN: [id number]   HPI  Chief Complaint  Patient presents with  . Follow-up    Increase in inhaler helping    Reason for Visit: Follow-up  [name] is a 70 year old female with COPD, history of lung nodule, hx of lung cancer in 2004, anxiety/depression and GERD who presents for follow-up.   Synopsis: Diagnosed with COPD 15 years ago. She was only started bronchodilators [place]) five years ago. Denies COPD exacerbations requiring ED or hospitalizations. She rarely gets treated for bronchitis or exacerbations as an outpatient. She quit smoking 2010.   Overall she feels better on the increased dose on the [place]. Has not required albuterol. She reports she is more active and walking 25 minutes 5-6 days a a week, which is a significant improvement . Has picked up yoga and weight training. She reports 20lb weight loss after changing her diet and activity. Her cough and wheezing has resolved and dyspnea only occurs with heavy exertion. Last COPD exacerbation September 2021. No ED/urgent care visits requiring steroids or antibiotics.  She is planning to undergo hysterectomy. Requesting for pre-op clearance for her lungs.  Daughter-in-law was recently diagnosed with recurrent breast cancer with brain mets. She is helping out with grandchildren. She is understandably going through a stressful time right now.  Prior inhalers: Spiriva - thrush Trelegy - chest congestion Breo - insurance  Social History: Former smoker. 80 pack years. Quit in 2010.  Imaging, labs and test noted above have been reviewed independently by me.   Past Medical History:  Diagnosis Date  . Anxiety   . Arthritis 2018  . Bulging lumbar disc 2018  . Complication of anesthesia    "low blood pressure after retinal detachment surgery"  . COPD (chronic obstructive pulmonary disease) ([place])   . Degenerative disc disease, lumbar 2018   . Depression   . Emphysema of lung ([place])   . History of bronchitis   . History of pneumonia    "multiple times"  . Hyperlipidemia   . Hyperlipidemia   . Kidney stones 02/2016  . lung ca 05/2003  . Lung cancer ([place])    s/p LUL lobectomy  . Osteoarthritis    spine, knees, hands  . PONV (postoperative nausea and vomiting)    pt "could not eat during hospital stay"  . Reflux esophagitis   . Rhinitis   . Scoliosis 2018  . Shortness of breath   . Wears glasses       Outpatient Medications Prior to Visit  Medication Sig Dispense Refill  . acetaminophen-codeine (TYLENOL #3) 300-30 MG tablet Take 1 tablet by mouth every 8 (eight) hours as needed for moderate pain.     Marland Kitchen amitriptyline (ELAVIL) 25 MG tablet Take by mouth.    . baclofen (LIORESAL) 10 MG tablet Take 10 mg by mouth 2 (two) times daily as needed for muscle spasms.     . celecoxib (CELEBREX) 200 MG capsule Take 200 mg by mouth daily. Pt has discontinued    . clonazePAM (KLONOPIN) 1 MG tablet Take 1 mg by mouth at bedtime.    . fluticasone (FLONASE) 50 MCG/ACT nasal spray Place 2 sprays into both nostrils daily.    . Fluticasone-Salmeterol (WIXELA INHUB) 250-50 MCG/DOSE AEPB Inhale 1 puff into the lungs in the morning and at bedtime. 60 each 12  . LINZESS 72 MCG capsule Take 72 mcg by mouth daily as needed (constipation).     Marland Kitchen  Omega-3 Fatty Acids (OMEGA 3 PO) Take 1 capsule by mouth 2 (two) times daily.    . polyvinyl alcohol (LIQUIFILM TEARS) 1.4 % ophthalmic solution Place 1 drop into both eyes as needed for dry eyes.    . traMADol (ULTRAM) 50 MG tablet Take by mouth every 6 (six) hours as needed.     No facility-administered medications prior to visit.    Review of Systems  Constitutional: Negative for chills, diaphoresis, fever, malaise/fatigue and weight loss.  HENT: Negative for congestion.   Respiratory: Negative for cough, hemoptysis, sputum production, shortness of breath and wheezing.   Cardiovascular: Negative  for chest pain, palpitations and leg swelling.     Objective:   Vitals:   02/29/20 1205  BP: 118/68  Pulse: 88  Temp: 98.3 F (36.8 C)  TempSrc: Oral  SpO2: 99%  Weight: 139 lb 3.2 oz (63.1 kg)  Height: 5' 5.5" (1.664 m)   SpO2: 99 % O2 Device: None (Room air)  Physical Exam: General: Well-appearing, no acute distress HENT: , AT Eyes: EOMI, no scleral icterus Respiratory: Clear to auscultation bilaterally.  No crackles, wheezing or rales Cardiovascular: RRR, -M/R/G, no JVD Extremities:-Edema,-tenderness Neuro: AAO x4, CNII-XII grossly intact Psych: Normal mood, normal affect  Data Reviewed:  Imaging: CT chest 03/26/2018-status post left upper lobe lobectomy with left mediastinal shift.  Stable right upper lobe and left upper lobe nodules.  Very severe centrilobular emphysema in the background  CT 03/28/19 - Unchanged compared to 03/26/18 exam with stable nodules.   PFT: 11/28/2014 FVC 3.68 (109%) FEV1 1.87 (72%) Ratio 51 TLC 121% DLCO 43% Interpretation: Mild obstructive defect with moderately reduced DLCO.  No significant bronchodilator response.  No air trapping.  04/11/19 FVC 3.06 (93%) FEV1 1.65 (66%) Ratio 53  TLC 116% DLCO 49% Interpretation: Moderate obstructive defect with air trapping and reduced DLCO consistent with emphysema. Worsened air obstruction compared to 2016.  Imaging, labs and test noted above have been reviewed independently by me.    Assessment & Plan:   Discussion: 70 year old female with COPD, history of lung nodule, hx of lung cancer in 2004, anxiety/depression who presents for follow-up. Improved symptoms in increased ICS/LABA. Not in exacerbation. Discussed pre-op evaluation for hysterectomy as noted below. Will also CC her Gynecologist to progress note.  Moderate COPD with emphysema and bronchitis - well controlled CONTINUE Wixela 200-40mcg ONE puff TWICE a day CONTINUE Albuterol as needed for shortness of breath CONTINUE Flonase 1  spray per nare daily  History of stage Ia lung cancer status post left upper lobe lobectomy in 2005 Right upper lobe nodule status post nondiagnostic navigational bronchoscopy in 2016 Recent nodules have been stable x 2 years. Likely benign Following CT surgery with Dr. Roxan Hockey  Surgery: Hysterectomy  Peri-operative Assessment of Pulmonary Risk for Non-Thoracic Surgery:  For Ms. Hargrove, risk of perioperative pulmonary complications is increased by: Age greater than 22 years, COPD (well-controlled)   Respiratory complications generally occur in 1% of ASA Class I patients. These complications rarely result in mortality and include postoperative pneumonia, atelectasis, pulmonary embolism, ARDS and increased time requiring postoperative mechanical ventilation.  Overall, I recommend proceeding with the surgery if the risk for respiratory complications are outweighed by the potential benefits. This will need to be discussed between the patient and surgeon.  To reduce risks of respiratory complications, I recommend: --Pre- and post-operative incentive spirometry performed frequently while awake  I have discussed the risk factors and recommendations above with the patient.   Health Maintenance  Immunization History  Administered Date(s) Administered  . Fluad Quad(high Dose 65+) 10/26/2019  . Influenza Split 10/22/2007, 11/20/2011, 11/08/2012, 11/04/2014  . Influenza, High Dose Seasonal PF 10/25/2016, 10/19/2017, 09/23/2018  . Influenza,inj,Quad PF,6+ Mos 10/30/2015  . PFIZER(Purple Top)SARS-COV-2 Vaccination 03/11/2019, 03/31/2019  . Pneumococcal Conjugate-13 02/22/2015  . Pneumococcal Polysaccharide-23 02/10/2005, 02/06/2011, 12/24/2016  . Td 08/31/2001  . Tdap 02/04/2011  . Zoster 02/04/2011  . Zoster Recombinat (Shingrix) 01/17/2018, 04/12/2018   CT Lung Screen - not qualified. Hx lung cancer  No orders of the defined types were placed in this encounter.  No orders of the  defined types were placed in this encounter.  Return in about 6 months (around 08/28/2020) for with me.   I have spent a total time of 33-minutes on the day of the appointment reviewing prior documentation, coordinating care and discussing medical diagnosis and plan with the patient/family. Imaging, labs and tests included in this note have been reviewed and interpreted independently by me.  Briani Maul Rodman Pickle, MD [facility] 02/29/2020 12:26 PM  Office Number [phone]

## 2020-03-01 ENCOUNTER — Encounter: Payer: Self-pay | Admitting: Pulmonary Disease

## 2020-03-01 NOTE — Progress Notes (Signed)
[facility] New Patient Evaluation and Consultation  Referring Provider: Megan Salon, MD PCP: Tammy Smoker, MD Date of Service: 03/06/2020  SUBJECTIVE Chief Complaint: New Patient (Initial Visit)  History of Present Illness: Tammy Eaton is a 70 y.o. White or Caucasian female seen in consultation at the request of Tammy Eaton for evaluation of prolapse.    Review of records from Dr Tammy Eaton significant for: Prior exam showed uterine prolapse without significant anterior/ posterior prolapse.   Seen by Pulmonology on 02/29/20, and recommended proceeding with surgery. Also recommended frequent pre and post op incentive spirometry.   Urinary Symptoms: Does not leak urine.   Day time voids 7.  Nocturia: 0 times per night to void. Voiding dysfunction: she sometimes empties her bladder well.  does not use a catheter to empty bladder.  When urinating, she feels a weak stream and the need to urinate multiple times in a row  UTIs: 1 UTI's in the last year.   Reports history of blood in urine and kidney or bladder stones- has seen [facility], no blood in urine recently.   Pelvic Organ Prolapse Symptoms:                  She Admits to a feeling of a bulge the vaginal area. It has been present for 1-2 years.  She Admits to seeing a bulge.  This bulge is bothersome.  Bowel Symptom: Bowel movements: avery other day Stool consistency: hard Straining: yes.  Splinting: no.  Incomplete evacuation: no.  She Denies accidental bowel leakage / fecal incontinence Bowel regimen: fiber Last colonoscopy: Date 2017, Results polyps- has colonoscopy scheduled 03/20/20  Sexual Function Sexually active: no.    Pelvic Pain Denies pelvic pain  Past Medical History:  Past Medical History:  Diagnosis Date  . Anxiety   . Arthritis 2018  . Bulging lumbar disc 2018  . Complication of anesthesia    "low blood pressure after retinal detachment surgery"  . COPD  (chronic obstructive pulmonary disease) ([place])   . Degenerative disc disease, lumbar 2018  . Depression   . Emphysema of lung ([place])   . History of bronchitis   . History of pneumonia    "multiple times"  . Hyperlipidemia   . Hyperlipidemia   . Kidney stones 02/2016  . lung ca 05/2003  . Lung cancer ([place])    s/p LUL lobectomy  . Osteoarthritis    spine, knees, hands  . PONV (postoperative nausea and vomiting)    pt "could not eat during hospital stay"  . Reflux esophagitis   . Rhinitis   . Scoliosis 2018  . Shortness of breath   . Wears glasses      Past Surgical History:   Past Surgical History:  Procedure Laterality Date  . BREAST EXCISIONAL BIOPSY    . BREAST LUMPECTOMY WITH NEEDLE LOCALIZATION Right 04/11/2013   Procedure: BREAST LUMPECTOMY WITH NEEDLE LOCALIZATION;  Surgeon: Tammy Hollingshead, MD;  Location: [facility];  Service: General;  Laterality: Right;  . BREAST SURGERY  1992   lt br bx  . CHOLECYSTECTOMY  11/11/2011   Procedure: LAPAROSCOPIC CHOLECYSTECTOMY WITH INTRAOPERATIVE CHOLANGIOGRAM;  Surgeon: Tammy Hollingshead, MD;  Location: [facility];  Service: General;  Laterality: N/A;  . CYSTOSCOPY  12/10   for evaluation of hematuria  . CYSTOSCOPY W/ URETERAL STENT PLACEMENT Left 11/16/2019   Procedure: CYSTOSCOPY WITH STENT REPLACEMENT;  Surgeon: Tammy Bring, MD;  Location: [facility];  Service: Urology;  Laterality: Left;  . DILATION AND CURETTAGE OF UTERUS    . ESOPHAGOGASTRODUODENOSCOPY    . EYE SURGERY     catataracts-both  . KNEE SURGERY     rt knee torn cartilage  . LUNG LOBECTOMY Left 4/05  . RETINAL DETACHMENT SURGERY  2011   left; x2  . SINUS SURGERY WITH INSTATRAK    . TUBAL LIGATION    . URETEROSCOPY WITH HOLMIUM LASER LITHOTRIPSY Left 11/29/2019   Procedure: CYSTOSCOPY; LEFT URETEROSCOPY; HOLMIUM LASER LITHOTRIPSY; LEFT URETERAL STENT EXCHANGE;  Surgeon: Tammy Aloe, MD;  Location: [facility];  Service: Urology;  Laterality: Left;  ONLY  NEEDS 60 MIN  . VIDEO BRONCHOSCOPY WITH ENDOBRONCHIAL NAVIGATION N/A 01/15/2015   Procedure: VIDEO BRONCHOSCOPY WITH ENDOBRONCHIAL NAVIGATION;  Surgeon: Tammy Nakayama, MD;  Location: [facility];  Service: Thoracic;  Laterality: N/A;  . VOCAL CORD LATERALIZATION, ENDOSCOPIC APPROACH W/ MLB     nodule removed     Past OB/GYN History: OB History  Gravida Para Term Preterm AB Living  2 2 2  0 0 2  SAB IAB Ectopic Multiple Live Births  0 0 0 0 2    # Outcome Date GA Lbr Len/2nd Weight Sex Delivery Anes PTL Lv  2 Term 1979    M Vag-Spont   LIV  1 Term 1977    M Vag-Spont   LIV   Menopausal: Yes, at age 2, Denies vaginal bleeding since menopause Last pap smear was 10/2019- negative.  Any history of abnormal pap smears: no.   Medications: She has a current medication list which includes the following prescription(s): acetaminophen-codeine, amitriptyline, baclofen, celecoxib, clonazepam, fluticasone, fluticasone-salmeterol, linzess, omega-3 fatty acids, polyvinyl alcohol, and tramadol.   Allergies: Patient is allergic to adhesive [tape], septra [sulfamethoxazole-trimethoprim], tramadol hcl, and amoxicillin.   Social History:  Social History   Tobacco Use  . Smoking status: Former Eaton    Packs/day: 2.00    Years: 45.00    Pack years: 90.00    Types: Cigarettes    Start date: 1965    Quit date: 09/21/2008    Years since quitting: 11.4  . Smokeless tobacco: Never Used  Vaping Use  . Vaping Use: Never used  Substance Use Topics  . Alcohol use: Yes    Alcohol/week: 0.0 - 1.0 standard drinks  . Drug use: No    Relationship status: married She lives with husband.   She is not employed. Regular exercise: Yes:   History of abuse: No  Family History:   Family History  Problem Relation Age of Onset  . Cancer Mother 38       ovarian cancer  . Hypertension Mother   . Diabetes Mother   . Alcohol abuse Father      Review of Systems: Review of Systems  Constitutional:  Negative for fever, malaise/fatigue and weight loss.  Respiratory: Positive for cough. Negative for shortness of breath and wheezing.   Cardiovascular: Negative for chest pain, palpitations and leg swelling.  Gastrointestinal: Negative for abdominal pain and blood in stool.  Genitourinary: Negative for dysuria.  Musculoskeletal: Negative for myalgias.  Skin: Negative for rash.  Neurological: Negative for dizziness and headaches.  Endo/Heme/Allergies: Bruises/bleeds easily.  Psychiatric/Behavioral: Negative for depression. The patient is not nervous/anxious.      OBJECTIVE Physical Exam: Vitals:   03/06/20 1052  BP: 117/69  Pulse: 65  Weight: 139 lb (63 kg)  Height: 5\' 3"  (1.6 m)    Physical Exam Constitutional:      General: She is  not in acute distress. Pulmonary:     Effort: Pulmonary effort is normal.  Abdominal:     General: There is no distension.     Palpations: Abdomen is soft.     Tenderness: There is no abdominal tenderness. There is no rebound.  Musculoskeletal:        General: No swelling. Normal range of motion.  Skin:    General: Skin is warm and dry.     Findings: No rash.  Neurological:     Mental Status: She is alert and oriented to person, place, and time.  Psychiatric:        Mood and Affect: Mood normal.        Behavior: Behavior normal.     GU / Detailed Urogynecologic Evaluation:  Pelvic Exam: Normal external female genitalia; Bartholin's and Skene's glands normal in appearance; urethral meatus normal in appearance, no urethral masses or discharge.   CST: negative  Speculum exam reveals normal vaginal mucosa with atrophy. Cervix normal appearance. Uterus normal single, nontender. Adnexa no mass, fullness, tenderness.    Pelvic floor strength II/V  Pelvic floor musculature: Right levator non-tender, Right obturator non-tender, Left levator non-tender, Left obturator non-tender  POP-Q:   POP-Q  -2                                             Aa   -2                                           Ba  1                                              C   3                                            Gh  3                                            Pb  8                                            tvl   -3                                            Ap  -3                                            Bp  -6.5  D     Rectal Exam:  Normal external rectum  Post-Void Residual (PVR) by Bladder Scan: In order to evaluate bladder emptying, we discussed obtaining a postvoid residual and she agreed to this procedure.  Procedure: The ultrasound unit was placed on the patient's abdomen in the suprapubic region after the patient had voided. A PVR of 116 ml was obtained by bladder scan.  Laboratory Results: POC urine: + ketones, otherwise negative I visualized the urine specimen, noting the specimen to be clear yellow  ASSESSMENT AND PLAN Tammy Eaton is a 70 y.o. with:  1. Uterovaginal prolapse, incomplete   2. Incomplete bladder emptying   3. Urinary frequency     1. Stage I anterior, Stage 0 posterior, Stage II apical prolapse For treatment of pelvic organ prolapse, we discussed options for management including expectant management, conservative management, and surgical management, such as Kegels, a pessary, pelvic floor physical therapy, and specific surgical procedures. - She is interested in surgery, specifically, TVH, BSO and uterosacral ligament suspension. Plan for co-case with Dr Tammy Eaton. Handouts provided about the surgery. She is opting to undergo urodynamic testing to test for occult incontinence and would be interested in a sling if indicated.   2. Incomplete bladder emptying - Boderline PVR today and she states that sometimes she does not empty well. Will undergo urodynamics to better assess.   3. Urinary frequency - POC urine negative for infection   Return for Urodynamic  testing  Jaquita Folds, MD   Medical Decision Making:  - Reviewed/ ordered a clinical laboratory test - Reviewed/ ordered medicine test - Review and summation of prior records

## 2020-03-05 ENCOUNTER — Telehealth: Payer: Self-pay

## 2020-03-05 NOTE — Telephone Encounter (Signed)
Tammy Eaton is a 70 y.o. female was contacted and health history verified

## 2020-03-06 ENCOUNTER — Encounter: Payer: Self-pay | Admitting: Obstetrics and Gynecology

## 2020-03-06 ENCOUNTER — Ambulatory Visit (INDEPENDENT_AMBULATORY_CARE_PROVIDER_SITE_OTHER): Payer: Medicare Other | Admitting: Obstetrics and Gynecology

## 2020-03-06 ENCOUNTER — Other Ambulatory Visit: Payer: Self-pay

## 2020-03-06 VITALS — BP 117/69 | HR 65 | Ht 63.0 in | Wt 139.0 lb

## 2020-03-06 DIAGNOSIS — R350 Frequency of micturition: Secondary | ICD-10-CM

## 2020-03-06 DIAGNOSIS — N812 Incomplete uterovaginal prolapse: Secondary | ICD-10-CM

## 2020-03-06 DIAGNOSIS — R339 Retention of urine, unspecified: Secondary | ICD-10-CM | POA: Diagnosis not present

## 2020-03-06 LAB — POCT URINALYSIS DIPSTICK
Appearance: NORMAL
Bilirubin, UA: NEGATIVE
Blood, UA: NEGATIVE
Glucose, UA: NEGATIVE
Leukocytes, UA: NEGATIVE
Nitrite, UA: NEGATIVE
Protein, UA: NEGATIVE
Spec Grav, UA: 1.025 (ref 1.010–1.025)
Urobilinogen, UA: 0.2 E.U./dL
pH, UA: 5 (ref 5.0–8.0)

## 2020-03-06 NOTE — Patient Instructions (Signed)
You have a stage 2 (out of 4) prolapse.  We discussed the fact that it is not life threatening but there are several treatment options. For treatment of pelvic organ prolapse, we discussed options for management including expectant management, conservative management, and surgical management, such as Kegels, a pessary, pelvic floor physical therapy, and specific surgical procedures.

## 2020-03-16 NOTE — Progress Notes (Signed)
[name] Urodynamics Procedure  Referring Physician: Glenis Smoker, * Date of Procedure: 03/23/2020  Tammy Eaton is a 70 y.o. female who presents for urodynamic evaluation. Indication(s) for study: occult SUI and incomplete bladder emptying.  Vital Signs: BP 120/82   Pulse 92   Ht 5\' 5"  (1.651 m)   Wt 138 lb (62.6 kg)   LMP 09/26/2013 (Exact Date)   BMI 22.96 kg/m   Laboratory Results: A catheterized urine specimen revealed:  POC urine: negative  Voiding Diary: Not performed  Procedure Timeout: The correct patient was verified and the correct procedure was verified. The procedure consent form was reviewed and accurate. The patient was in the correct position and safety precautions were reviewed based on at the patient's history.   Urodynamic Procedure A 74F dual lumen urodynamics catheter was placed under sterile conditions into the patient's bladder. A 74F catheter was placed into the rectum in order to measure abdominal pressure. EMG patches were placed in the appropriate position.  All connections were confirmed and calibrations/adjusted made. Saline was instilled into the bladder through the dual lumen catheters.  Cough/valsalva pressures were measured periodically during filling.  Patient was allowed to void.  The bladder was then emptied of its residual.  UROFLOW: Unable to void. PVR = 229ml  CMG: This was performed with sterile water in the sitting position at a fill rate of 30-50 mL/min.    First sensation of fullness was 130 mLs,  First urge was 186 mLs,  Strong urge was 189 mLs and  Capacity was 400 mLs  Stress incontinence was not demonstrated Highest negative Barrier CLPP was 143 cmH20 at 314 ml. Highest negative Barrier VLPP was 92 cmH20 at 314 ml. Testing was performed in sitting and standing positions.   Detrusor function was normal, with no phasic contractions seen.   Compliance:  normal. End fill detrusor pressure was  8.4cmH20.  Calculated compliance was 80mL/cmH20  UPP: MUCP with/ without barrier reduction was 71.5 cm.    MICTURITION STUDY: Unable to void  EMG: This was performed with patches.  She had voluntary contractions, recruitment with fill was not present and urethral sphincter..  The details of the procedure with the study tracings have been scanned into EPIC.   Urodynamic Impression:  1. Sensation was normal; capacity was normal 2. Stress Incontinence was not demonstrated; 3. Detrusor Overactivity was not demonstrated. 4. Emptying was dysfunctional with a elevated PVR- unable to void.  Plan: - The patient will follow up  to discuss the findings and treatment options.  - Patient states she is unable to void "on demand". At home, she will urinate then self catheterize after, at least 4 times per day over the next two days. Then if she has still has elevated PVRs, we will readdress. She was taught self-catheterizing today and was given supplies.

## 2020-03-20 ENCOUNTER — Other Ambulatory Visit: Payer: Medicare Other

## 2020-03-20 ENCOUNTER — Ambulatory Visit: Payer: Medicare Other | Admitting: Thoracic Surgery (Cardiothoracic Vascular Surgery)

## 2020-03-22 ENCOUNTER — Telehealth (HOSPITAL_BASED_OUTPATIENT_CLINIC_OR_DEPARTMENT_OTHER): Payer: Self-pay | Admitting: *Deleted

## 2020-03-22 NOTE — Telephone Encounter (Signed)
Pt called requesting to know if she will have a co-pay for the urodynamics testing. Advised that according to her insurance I did not see a copay but she would probably get a bill for around $120 which is the 20% co insurance. Pt verbalized understanding and wanted to continue with appt.

## 2020-03-23 ENCOUNTER — Encounter: Payer: Self-pay | Admitting: Obstetrics and Gynecology

## 2020-03-23 ENCOUNTER — Ambulatory Visit (INDEPENDENT_AMBULATORY_CARE_PROVIDER_SITE_OTHER): Payer: Medicare Other | Admitting: Obstetrics and Gynecology

## 2020-03-23 ENCOUNTER — Other Ambulatory Visit: Payer: Self-pay

## 2020-03-23 VITALS — BP 120/82 | HR 92 | Ht 65.0 in | Wt 138.0 lb

## 2020-03-23 DIAGNOSIS — R3914 Feeling of incomplete bladder emptying: Secondary | ICD-10-CM

## 2020-03-23 DIAGNOSIS — N398 Other specified disorders of urinary system: Secondary | ICD-10-CM

## 2020-03-23 NOTE — Patient Instructions (Signed)
Catheterize at least 4 times day for 2 days. Try to urinate first then measure volume, then catheterize and measure how much comes out.  - In the morning after waking - Around lunch time - Around dinner time - Before bedtime  Call the office at [phone] to let us know the volumes.

## 2020-03-26 ENCOUNTER — Telehealth: Payer: Self-pay | Admitting: *Deleted

## 2020-03-26 NOTE — Telephone Encounter (Signed)
Pt called to report urine output volumes. She reports the following:  Saturday 2/19 am: void-126ml; cath-142ml                     Noon: void-158ml; cath-125ml                   Dinner: void-165ml; cath- <138ml                       Bed: void-156ml; cath- 157ml  Sunday 2/20  Am: void-115ml; cath-154ml                     Noon: void-<128ml; cath- <178ml                    Dinner: void- 276ml; cath- none                       Bed: void0 140ml; cath-170ml  Pt states that she has been taking clonazepam 1 mg at bedtime. She states that she feels that this is causing some of the urinary retention problems she is having in the mornings. She says that she does not feel an urge to urinate when she wakes up.  Advised pt to continue with self-cath after voiding in the morning and at bedtime as that seems to be when she is having the most trouble with emptying her bladder.  Advised that I would call her back on Thursday and get updated totals. Advised that I would send these numbers to Dr. Wannetta Sender but it may be next week before a response is received as she is out of the office this week. Pt states that she only had one catheter left. Advised that she could use soap and water to clean it with or come by the office and we could supply her with more. Pt verbalized understanding.

## 2020-03-27 ENCOUNTER — Other Ambulatory Visit: Payer: Self-pay

## 2020-03-27 ENCOUNTER — Encounter: Payer: Self-pay | Admitting: Thoracic Surgery (Cardiothoracic Vascular Surgery)

## 2020-03-27 ENCOUNTER — Ambulatory Visit: Payer: Medicare Other | Admitting: Thoracic Surgery (Cardiothoracic Vascular Surgery)

## 2020-03-27 ENCOUNTER — Other Ambulatory Visit: Payer: Self-pay | Admitting: *Deleted

## 2020-03-27 ENCOUNTER — Ambulatory Visit
Admission: RE | Admit: 2020-03-27 | Discharge: 2020-03-27 | Disposition: A | Discharge status: Home / Self Care | Payer: Medicare Other | Source: Ambulatory Visit | Attending: Thoracic Surgery (Cardiothoracic Vascular Surgery) | Admitting: Thoracic Surgery (Cardiothoracic Vascular Surgery)

## 2020-03-27 VITALS — BP 113/73 | HR 88 | Resp 20 | Ht 65.0 in | Wt 138.0 lb

## 2020-03-27 DIAGNOSIS — R918 Other nonspecific abnormal finding of lung field: Secondary | ICD-10-CM | POA: Diagnosis not present

## 2020-03-27 DIAGNOSIS — Z85118 Personal history of other malignant neoplasm of bronchus and lung: Secondary | ICD-10-CM

## 2020-03-27 DIAGNOSIS — R911 Solitary pulmonary nodule: Secondary | ICD-10-CM

## 2020-03-27 NOTE — Progress Notes (Signed)
[address]       [place] [address]             [phone]     HPI: Tammy Eaton returns for a scheduled follow-up  Doniesha Landau is a 70 year old woman with a history of tobacco abuse and severe COPD, stage Ia non-small cell carcinoma of the lung in 2005, hyperlipidemia, osteoarthritis, anxiety, depression, degenerative disc disease, nephrolithiasis, and urinary tract infection.  She had an 80-pack-year history of smoking prior to quitting in 2010.  She underwent a left upper lobectomy for stage Ia non-small cell carcinoma in 2005.  In 2016 she had a new right upper lobe lung nodule.  Navigational bronchoscopy showed inflammation and she has been followed radiographically since then.  She has been doing well from a respiratory standpoint recently.  She is followed by Dr. Loanne Drilling.  She had issues with a urinary tract infection and kidney stones.  She has had some procedures done for that.  She is trying to get a hysterectomy scheduled.  Past Medical History:  Diagnosis Date  . Anxiety   . Arthritis 2018  . Bulging lumbar disc 2018  . Complication of anesthesia    "low blood pressure after retinal detachment surgery"  . COPD (chronic obstructive pulmonary disease) ([facility])   . Degenerative disc disease, lumbar 2018  . Depression   . Emphysema of lung ([facility])   . History of bronchitis   . History of pneumonia    "multiple times"  . Hyperlipidemia   . Hyperlipidemia   . Kidney stones 02/2016  . lung ca 05/2003  . Lung cancer ([facility])    s/p LUL lobectomy  . Osteoarthritis    spine, knees, hands  . PONV (postoperative nausea and vomiting)    pt "could not eat during hospital stay"  . Reflux esophagitis   . Rhinitis   . Scoliosis 2018  . Shortness of breath   . Wears glasses     Current Outpatient Medications  Medication Sig Dispense Refill  . acetaminophen-codeine (TYLENOL #3) 300-30 MG tablet Take 1 tablet by mouth every 8 (eight) hours as needed for  moderate pain.    Marland Kitchen amitriptyline (ELAVIL) 25 MG tablet Take by mouth.    . baclofen (LIORESAL) 10 MG tablet Take 10 mg by mouth 2 (two) times daily as needed for muscle spasms.     . celecoxib (CELEBREX) 200 MG capsule Take 200 mg by mouth daily. Pt has discontinued    . clonazePAM (KLONOPIN) 1 MG tablet Take 1 mg by mouth at bedtime.    . fluticasone (FLONASE) 50 MCG/ACT nasal spray Place 2 sprays into both nostrils daily.    . Fluticasone-Salmeterol (WIXELA INHUB) 250-50 MCG/DOSE AEPB Inhale 1 puff into the lungs in the morning and at bedtime. 60 each 12  . LINZESS 72 MCG capsule Take 72 mcg by mouth daily as needed (constipation).     . Omega-3 Fatty Acids (OMEGA 3 PO) Take 1 capsule by mouth 2 (two) times daily.    . polyvinyl alcohol (LIQUIFILM TEARS) 1.4 % ophthalmic solution Place 1 drop into both eyes as needed for dry eyes.    . traMADol (ULTRAM) 50 MG tablet Take by mouth every 6 (six) hours as needed.     No current facility-administered medications for this visit.    Physical Exam BP 113/73   Pulse 88   Resp 20   Ht 5\' 5"  (1.651 m)   Wt 138 lb (62.6  kg)   LMP 09/26/2013 (Exact Date)   SpO2 93% Comment: RA  BMI 22.56 kg/m  70 year old woman in no acute distress No cervical or supraclavicular adenopathy Lungs diminished breath sounds bilaterally no wheezing present Cardiac regular rate and rhythm No cervical or supraclavicular adenopathy  Diagnostic Tests: CT CHEST WITHOUT CONTRAST  TECHNIQUE: Multidetector CT imaging of the chest was performed following the standard protocol without IV contrast.  COMPARISON:  Chest CT 03/28/2019.  FINDINGS: Cardiovascular: Heart size is normal. There is no significant pericardial fluid, thickening or pericardial calcification. There is aortic atherosclerosis, as well as atherosclerosis of the great vessels of the mediastinum and the coronary arteries, including calcified atherosclerotic plaque in the left anterior  descending and right coronary arteries.  Mediastinum/Nodes: No pathologically enlarged mediastinal or hilar lymph nodes. Please note that accurate exclusion of hilar adenopathy is limited on noncontrast CT scans. Esophagus is unremarkable in appearance. No axillary lymphadenopathy.  Lungs/Pleura: Status post left upper lobectomy. Multiple new pulmonary nodules and masses are noted in the lungs bilaterally. The largest and most concerning of which is in the superior aspect of the left lower lobe (axial image 46 of series 8) measuring 3.1 x 1.6 cm. Other prominent nodules include a left lower lobe nodule near the base (axial image 97 of series 8) measuring 1.6 x 1.1 cm, and a right upper lobe pulmonary nodule (axial image 41 of series 8) measuring 1.3 x 0.9 cm.  Upper Abdomen: 2 tiny 1-2 mm nonobstructive calculi are noted within the upper pole collecting system of the left kidney. Aortic atherosclerosis. Status post cholecystectomy.  Musculoskeletal: Old healed fracture of the mid sternum. There are no aggressive appearing lytic or blastic lesions noted in the visualized portions of the skeleton.  IMPRESSION: 1. Multiple new pulmonary nodules and masses in the lungs, most concerning of which is in the left upper lobe measuring 3.1 x 1.6 cm. The possibility of recurrent neoplasm warrants consideration, and further evaluation with PET-CT is recommended at this time. 2. Diffuse bronchial wall thickening with severe centrilobular and paraseptal emphysema; imaging findings suggestive of underlying COPD. 3. Aortic atherosclerosis, in addition to 2 vessel coronary artery disease. Assessment for potential risk factor modification, dietary therapy or pharmacologic therapy may be warranted, if clinically indicated. 4. Tiny nonobstructive calculi in the upper pole collecting system of the left kidney measuring 1-2 mm in size.  These results will be called to the ordering clinician  or representative by the Radiologist Assistant, and communication documented in the PACS or [employer].  Aortic Atherosclerosis (ICD10-I70.0) and Emphysema (ICD10-J43.9).   Electronically Signed   By: Vinnie Langton M.D.   On: 03/27/2020 12:17 I personally reviewed the CT images and concur with the finding of new lung nodules the largest in the left lung measuring 3.1 x 1.6 cm.  Severe emphysema present.  Thoracic aortic and coronary atherosclerosis also present.  Impression: Tammy Eaton is a 70 year old woman with a history of heavy tobacco abuse prior to quitting in 2010.  She also has a history of COPD, stage Ia non-small cell carcinoma, hyperlipidemia, osteoarthritis, anxiety, depression, degenerative disc disease, nephrolithiasis, and urinary tract infection.   She has been followed with CT scans for many years.  She had a new right upper lobe nodule in 2016 that was highly suspicious.  Biopsy showed only inflammatory changes.  That was subsequently followed and was stable.  She now has a new lesion in the left lung.  It measures 3.1 x 1.6 cm.  While  this could be a new primary bronchogenic carcinoma there also exists possibility that this is some infectious or inflammatory nodule similar to the right side previously.  I do believe that further investigation is warranted.  I recommended that we do a PET/CT.  It has been a year since her last CT, as far as we know this could have arisen anytime in the interval.  Also if this does turn out to be hypermetabolic there may be some other area that is hypermetabolic that can be biopsied without the need for general anesthesia.  If further investigation is warranted we will plan to do a navigational bronchoscopy.  That does require general anesthesia.  She said that procedure before and is familiar with it.  Plan: Convert CT to super D protocol PET/CT to guide initial diagnostic work-up of left upper lobe nodule Return in 2 weeks  to discuss results  I spent over 20 minutes in review of records, images, and in consultation with Mrs. Fornes today. [name], MD [facility] [phone]

## 2020-03-28 NOTE — Telephone Encounter (Signed)
This sounds like a good plan. She can also try to double void in the morning and then do a test cath to see if this helps her empty. I can call her next week to discuss more.

## 2020-04-02 ENCOUNTER — Telehealth: Payer: Self-pay

## 2020-04-02 NOTE — Telephone Encounter (Signed)
She does not need to continue catheterizing at this time. Please let her know.

## 2020-04-02 NOTE — Telephone Encounter (Signed)
Tammy Eaton is a 70 y.o. female called and spoke with [name] and gave her voiding and Cath's listed below  Voiding Tues- AM 10oz    Cath 159mL           PM  196mL Cath 132mL Wed- AM 8oz      Cath 189mL           PM less than 121mL Cath 125ml Thurs- AM 10oz    Cath less than 165ml

## 2020-04-02 NOTE — Telephone Encounter (Signed)
Tammy Eaton is a 70 y.o. female was contacted and notified of the message from Dr. Wannetta Sender below. Pt verbalized understanding.

## 2020-04-05 NOTE — Progress Notes (Signed)
[facility] Return Visit  SUBJECTIVE  History of Present Illness: Tammy Eaton is a 70 y.o. female seen in follow-up after urodynamic testing.   Urodynamic Impression:  1. Sensation was normal; capacity was normal 2. Stress Incontinence was not demonstrated; 3. Detrusor Overactivity was not demonstrated. 4. Emptying was dysfunctional with a elevated PVR- unable to void.  Patient did several says of self-catheterization in order to determine if she truly has elevated PVR since she was unable to void at the end of the study. Her PVRs ranged from 100-136ml, the ones elevated in the morning and at night before bed.  Then she changed the morning and evening catheterizations to when she normally wakes and goes to bed and all PVRs were 197ml or less. Normally takes clonazepam at bedtime and she feels her bladder is affected by this- does not always void right when she wakes because she doesn't have the sensation.   She is interested in proceeding with surgery for her prolapse.   Past Medical History: Patient  has a past medical history of Anxiety, Arthritis (2018), Bulging lumbar disc (2122), Complication of anesthesia, COPD (chronic obstructive pulmonary disease) ([place]), Degenerative disc disease, lumbar (2018), Depression, Emphysema of lung ([place]), History of bronchitis, History of pneumonia, Hyperlipidemia, Hyperlipidemia, Kidney stones (02/2016), lung ca (05/2003), Lung cancer ([facility]), Osteoarthritis, PONV (postoperative nausea and vomiting), Reflux esophagitis, Rhinitis, Scoliosis (2018), Shortness of breath, and Wears glasses.   Past Surgical History: She  has a past surgical history that includes Lung lobectomy (Left, 4/05); Sinus surgery with [employer]; Tubal ligation; Dilation and curettage of uterus; Vocal cord lateralization, endoscopic approach w/ MLB; Knee surgery; Cystoscopy (12/10); Breast surgery (1992); Eye surgery; Retinal detachment surgery (2011); Cholecystectomy  (11/11/2011); Breast lumpectomy with needle localization (Right, 04/11/2013); Esophagogastroduodenoscopy; Video bronchoscopy with endobronchial navigation (N/A, 01/15/2015); Breast excisional biopsy; Cystoscopy w/ ureteral stent placement (Left, 11/16/2019); and Ureteroscopy with holmium laser lithotripsy (Left, 11/29/2019).   Medications: She has a current medication list which includes the following prescription(s): acetaminophen-codeine, amitriptyline, baclofen, celecoxib, clonazepam, fluticasone, fluticasone-salmeterol, linzess, omega-3 fatty acids, polyvinyl alcohol, and tramadol.   Allergies: Patient is allergic to adhesive [tape], septra [sulfamethoxazole-trimethoprim], tramadol hcl, and amoxicillin.   Social History: Patient  reports that she quit smoking about 11 years ago. Her smoking use included cigarettes. She started smoking about 57 years ago. She has a 90.00 pack-year smoking history. She has never used smokeless tobacco. She reports current alcohol use. She reports that she does not use drugs.      OBJECTIVE     Physical Exam: Vitals:   04/06/20 1248  BP: 108/62  Pulse: 87  Weight: 136 lb (61.7 kg)   Gen: No apparent distress, A&O x 3.  Detailed Urogynecologic Evaluation:  Deferred. Prior exam showed:   POP-Q  -2                                            Aa   -2                                           Ba  1  C   3                                            Gh  3                                            Pb  8                                            tvl   -3                                            Ap  -3                                            Bp  -6.5                                              D       ASSESSMENT AND PLAN    Tammy Eaton is a 70 y.o. with:  1. Uterovaginal prolapse, incomplete   2. Prolapse of anterior vaginal wall     Plan for surgery: Exam under anesthesia,  total vaginal hysterectomy, bilateral salpingo-oophorectomy, uterosacral ligament suspension, anterior repair, cystoscopy, possible laparoscopy.   Plan for co-case with Dr Sabra Heck  - We reviewed the patient's specific anatomic and functional findings, with the assistance of diagrams, and together finalized the above procedure. The planned surgical procedures were discussed along with the surgical risks outlined below, which were also provided on a detailed handout. Additional treatment options including expectant management, conservative management, medical management were discussed where appropriate.  We reviewed the benefits and risks of each treatment option.   General Surgical Risks: For all procedures, there are risks of bleeding, infection, damage to surrounding organs including but not limited to bowel, bladder, blood vessels, ureters and nerves, and need for further surgery if an injury were to occur. These risks are all low with minimally invasive surgery.   There are risks of numbness and weakness at any body site or buttock/rectal pain.  It is possible that baseline pain can be worsened by surgery, either with or without mesh. If surgery is vaginal, there is also a low risk of possible conversion to laparoscopy or open abdominal incision where indicated. Very rare risks include blood transfusion, blood clot, heart attack, pneumonia, or death.   There is also a risk of short-term postoperative urinary retention with need to use a catheter. About half of patients need to go home from surgery with a catheter, which is then later removed in the office. The risk of long-term need for a catheter is very low. There is also a risk of worsening of overactive bladder.    Prolapse (with or without mesh): Risk factors  for surgical failure  include things that put pressure on your pelvis and the surgical repair, including obesity, chronic cough, and heavy lifting or straining (including lifting children  or adults, straining on the toilet, or lifting heavy objects such as furniture or anything weighing >25 lbs. Risks of recurrence is 20-30% with vaginal native tissue repair and a less than 10% with sacrocolpopexy with mesh.     - For preop Visit:  She is required to have a visit within 30 days of her surgery.   Today we reviewed pre-operative preparation, peri-operative expectations, and post-operative instructions/recovery.  She was provided with instructional handouts. She understands not to take aspirin (>81mg ) or NSAIDs 7 days prior to surgery. Prescriptions will be provided for: Oxycodone 5mg , Tylenol 500mg . She takes celebrex so will not prescribe ibuprofen. Also takes Miralax with Linzess and does not need it prescribed. These prescriptions will be sent prior to surgery.  Catheter use: Patient will plan to self-catheterize if she fails the post-op voiding trial. She was given supplies at today's visit.   Laboratory testing:  We will check labs: CBC and Type and Screen  We also discussed that Covid testing will take place prior to her surgery and will get cancelled if she tests positive.    - Medical clearance: required, seen by Pulmonology on 02/29/20. She has well controlled COPD, ASA class I. Recommended pre and post op incentive spirometry - Anticoagulant use: No - Medicaid Hysterectomy form: No - Accepts blood transfusion: Yes - Expected length of stay: outpatient  Request sent for surgery scheduling.   Jaquita Folds, MD   Time spent: I spent 40 minutes dedicated to the care of this patient on the date of this encounter to include pre-visit review of records, face-to-face time with the patient discussing surgical options and post visit documentation.

## 2020-04-06 ENCOUNTER — Other Ambulatory Visit: Payer: Self-pay

## 2020-04-06 ENCOUNTER — Encounter: Payer: Self-pay | Admitting: Obstetrics and Gynecology

## 2020-04-06 ENCOUNTER — Ambulatory Visit (INDEPENDENT_AMBULATORY_CARE_PROVIDER_SITE_OTHER): Payer: Medicare Other | Admitting: Obstetrics and Gynecology

## 2020-04-06 VITALS — BP 108/62 | HR 87 | Wt 136.0 lb

## 2020-04-06 DIAGNOSIS — N812 Incomplete uterovaginal prolapse: Secondary | ICD-10-CM

## 2020-04-06 DIAGNOSIS — N8110 Cystocele, unspecified: Secondary | ICD-10-CM | POA: Diagnosis not present

## 2020-04-09 ENCOUNTER — Ambulatory Visit (HOSPITAL_COMMUNITY)
Admission: RE | Admit: 2020-04-09 | Discharge: 2020-04-09 | Disposition: A | Discharge status: Home / Self Care | Payer: Medicare Other | Source: Ambulatory Visit | Attending: Thoracic Surgery (Cardiothoracic Vascular Surgery) | Admitting: Thoracic Surgery (Cardiothoracic Vascular Surgery)

## 2020-04-09 ENCOUNTER — Other Ambulatory Visit: Payer: Self-pay

## 2020-04-09 DIAGNOSIS — I2510 Atherosclerotic heart disease of native coronary artery without angina pectoris: Secondary | ICD-10-CM | POA: Insufficient documentation

## 2020-04-09 DIAGNOSIS — R918 Other nonspecific abnormal finding of lung field: Secondary | ICD-10-CM | POA: Diagnosis not present

## 2020-04-09 DIAGNOSIS — J439 Emphysema, unspecified: Secondary | ICD-10-CM | POA: Diagnosis not present

## 2020-04-09 DIAGNOSIS — I700 Atherosclerosis of aorta: Secondary | ICD-10-CM | POA: Diagnosis not present

## 2020-04-09 DIAGNOSIS — Z85118 Personal history of other malignant neoplasm of bronchus and lung: Secondary | ICD-10-CM | POA: Insufficient documentation

## 2020-04-09 LAB — GLUCOSE, CAPILLARY: Glucose-Capillary: 99 mg/dL (ref 70–99)

## 2020-04-09 MED ORDER — FLUDEOXYGLUCOSE F - 18 (FDG) INJECTION
6.6000 | Freq: Once | INTRAVENOUS | Status: AC | PRN
  Administered 2020-04-09: 6.6 via INTRAVENOUS

## 2020-04-10 ENCOUNTER — Encounter: Payer: Medicare Other | Admitting: Thoracic Surgery (Cardiothoracic Vascular Surgery)

## 2020-04-10 ENCOUNTER — Encounter: Payer: Self-pay | Admitting: Thoracic Surgery (Cardiothoracic Vascular Surgery)

## 2020-04-10 ENCOUNTER — Ambulatory Visit: Payer: Medicare Other | Admitting: Thoracic Surgery (Cardiothoracic Vascular Surgery)

## 2020-04-10 VITALS — BP 110/70 | HR 86 | Temp 99.1°F | Resp 20 | Ht 65.5 in | Wt 137.8 lb

## 2020-04-10 DIAGNOSIS — R911 Solitary pulmonary nodule: Secondary | ICD-10-CM

## 2020-04-10 NOTE — Progress Notes (Signed)
[address]       [place] [address]             [phone]    HPI: Tammy Eaton returns to discuss the results of her PET/CT  Tammy Eaton is a 70 year old woman with a history of tobacco abuse and severe COPD, stage Ia non-small cell carcinoma of the lung in 2005, hyperlipidemia, osteoarthritis, anxiety, depression, degenerative disc disease, nephrolithiasis, and UTI.  She quit smoking in 2010.  She had an 80-pack-year history prior to that.  As noted she had a left upper lobectomy for stage Ia non-small cell carcinoma in 2005.  In 2016 she had a new right upper lobe lung nodule.  Navigational bronchoscopy showed inflammation and she has been followed radiographically since then.  I recently saw her in follow-up.  She had a CT that showed new lung nodules.  The largest was in the superior segment of the left lower lobe measuring 3.1 x 1.6 cm.  I recommended a PET/CT.  Past Medical History:  Diagnosis Date  . Anxiety   . Arthritis 2018  . Bulging lumbar disc 2018  . Complication of anesthesia    "low blood pressure after retinal detachment surgery"  . COPD (chronic obstructive pulmonary disease) ([place])   . Degenerative disc disease, lumbar 2018  . Depression   . Emphysema of lung ([place])   . History of bronchitis   . History of pneumonia    "multiple times"  . Hyperlipidemia   . Hyperlipidemia   . Kidney stones 02/2016  . lung ca 05/2003  . Lung cancer ([place])    s/p LUL lobectomy  . Osteoarthritis    spine, knees, hands  . PONV (postoperative nausea and vomiting)    pt "could not eat during hospital stay"  . Reflux esophagitis   . Rhinitis   . Scoliosis 2018  . Shortness of breath   . Wears glasses     Current Outpatient Medications  Medication Sig Dispense Refill  . acetaminophen-codeine (TYLENOL #3) 300-30 MG tablet Take 1 tablet by mouth every 8 (eight) hours as needed for moderate pain.    Marland Kitchen amitriptyline (ELAVIL) 25 MG tablet Take by mouth.     . baclofen (LIORESAL) 10 MG tablet Take 10 mg by mouth 2 (two) times daily as needed for muscle spasms.     . celecoxib (CELEBREX) 200 MG capsule Take 200 mg by mouth daily. Pt has discontinued    . clonazePAM (KLONOPIN) 1 MG tablet Take 1 mg by mouth at bedtime.    . fluticasone (FLONASE) 50 MCG/ACT nasal spray Place 2 sprays into both nostrils daily.    . Fluticasone-Salmeterol (WIXELA INHUB) 250-50 MCG/DOSE AEPB Inhale 1 puff into the lungs in the morning and at bedtime. 60 each 12  . LINZESS 72 MCG capsule Take 72 mcg by mouth daily as needed (constipation).     . Omega-3 Fatty Acids (OMEGA 3 PO) Take 1 capsule by mouth 2 (two) times daily.    . polyvinyl alcohol (LIQUIFILM TEARS) 1.4 % ophthalmic solution Place 1 drop into both eyes as needed for dry eyes.    . traMADol (ULTRAM) 50 MG tablet Take by mouth every 6 (six) hours as needed.     No current facility-administered medications for this visit.    Physical Exam BP 110/70 (BP Location: Right Arm, Patient Position: Sitting, Cuff Size: Normal)   Pulse 86   Temp 99.1 F (37.3 C)   Resp 20  Ht 5' 5.5" (1.664 m)   Wt 137 lb 12.8 oz (62.5 kg)   LMP 09/26/2013 (Exact Date)   SpO2 96% Comment: RA  BMI 22.68 kg/m  70 year old woman in no acute distress Alert and oriented x3 Well-developed and well-nourished  Diagnostic Tests: NUCLEAR MEDICINE PET SKULL BASE TO THIGH  TECHNIQUE: 6.6 mCi F-18 FDG was injected intravenously. Full-ring PET imaging was performed from the skull base to thigh after the radiotracer. CT data was obtained and used for attenuation correction and anatomic localization.  Fasting blood glucose: 99 mg/dl  COMPARISON:  03/27/2020 chest CT.  12/04/2014 PET-CT.  FINDINGS: Mediastinal blood pool activity: SUV max 2.7  Liver activity: SUV max NA  NECK: No hypermetabolic lymph nodes in the neck.  Incidental CT findings: none  CHEST:  Solid central right upper lobe 1.4 cm pulmonary nodule  demonstrates low level uptake (max SUV 2.4) below that of the mediastinal blood pool.  Irregular bandlike focus of consolidation in the posterior right upper lobe measuring up to 1.0 cm thickness demonstrates no hypermetabolism with max SUV 1.6 (series 8/image 18).  Status post left upper lobectomy. Masslike 3.2 x 1.6 cm focus of consolidation in left lower lobe demonstrates low level uptake (max SUV 2.3) below that of the mediastinal blood pool.  Vague subsolid peripheral basilar left lower lobe 2.2 x 0.8 cm pulmonary nodule demonstrates mild hypermetabolism with max SUV 2.9 (series 8/image 50), which appears mildly increased from 1.8 cm on 03/28/2019 and 1.6 cm on 03/26/2018 chest CT studies using similar measurement technique.  No enlarged or hypermetabolic axillary, mediastinal or hilar lymph nodes.  Incidental CT findings: Severe centrilobular emphysema. Tiny 0.2 cm peripheral right lower lobe solid pulmonary nodule is below PET resolution (series 8/image 44). Coronary atherosclerosis. Atherosclerotic nonaneurysmal thoracic aorta.  ABDOMEN/PELVIS: No abnormal hypermetabolic activity within the liver, pancreas, adrenal glands, or spleen. No hypermetabolic lymph nodes in the abdomen or pelvis.  Incidental CT findings: Cholecystectomy. Atherosclerotic nonaneurysmal abdominal aorta.  SKELETON: No focal hypermetabolic activity to suggest skeletal metastasis.  Incidental CT findings: none  IMPRESSION: 1. Mild hypermetabolism associated with the vague subsolid peripheral basilar left lower lobe 2.2 x 0.8 cm pulmonary nodule, slowly increasing on multiple chest CT studies back to 2020. Low-grade malignancy such as due to primary bronchogenic adenocarcinoma not excluded. 2. Low level uptake below that of the mediastinal blood pool associated with the separate masslike 3.2 cm focus of consolidation in the left lower lobe and associated with the central right  upper lobe pulmonary nodule and irregular bandlike focus of consolidation in the posterior right upper lobe. Favor postinfectious/postinflammatory nodularity at these sites warranting continued chest CT surveillance. 3. No hypermetabolic thoracic adenopathy or distant metastatic disease. 4. Chronic findings include: Aortic Atherosclerosis (ICD10-I70.0) and Emphysema (ICD10-J43.9). Coronary atherosclerosis.   Electronically Signed   By: Ilona Sorrel M.D.   On: 04/09/2020 09:41 I personally reviewed the CT images.  There is minimal activity in the lung nodules.  I am less impressed with the basilar left lower lobe area than the radiologist.  There is some mild uptake in that area but that area is less nodular in appearance that was on the CT 2 weeks ago.  May be a slight increase in size overall but could just as easily be waxing and waning inflammation as neoplasm.  Impression: Tammy Eaton is a 70 year old lady with history of tobacco abuse, lung cancer, severe COPD, and inflammatory lung nodules.  She had an 80-pack-year history of smoking prior to quitting  in 2010.  She recently had a CT which showed multiple new lung nodules.  This was the first CT scan in a year, so it was unclear when these nodules had first arisen.  She has had nodules in the past that turned out to only be inflammatory.  A PET/CT showed the majority of the nodules have uptake below the background.  There is 1 nodule in the left base which shows a little bit of activity above baseline.  Has been there for several years.  It may have slightly gotten bigger compared to a year or 2 ago.  I reviewed all the CT scans.  However, it also is less enlarged than it was on the CT 2 weeks ago.  It very well could just be a waxing and waning inflammatory nodule.  I do not think there is anything about that nodule that warrants aggressive intervention at this point as the risk of biopsy would be extremely high.  Plan: Return in 3  months with CT chest No reason to delay hysterectomy from a thoracic surgical standpoint.  I spent 15 minutes in review of images and in consultation with Tammy Eaton today.  [name], MD [facility] [phone]

## 2020-04-17 ENCOUNTER — Encounter (HOSPITAL_BASED_OUTPATIENT_CLINIC_OR_DEPARTMENT_OTHER): Payer: Self-pay | Admitting: Obstetrics & Gynecology

## 2020-04-17 ENCOUNTER — Other Ambulatory Visit: Payer: Self-pay

## 2020-04-17 NOTE — Progress Notes (Signed)
YOU ARE SCHEDULED FOR A COVID TEST 04-19-2020 at 215 pm. THIS TEST MUST BE DONE BEFORE SURGERY. GO TO  [place]. [place], [place], IT IS APPROXIMATELY 2 MINUTES PAST [employer] ON THE RIGHT AND REMAIN IN YOUR CAR, THIS IS A DRIVE UP TEST. ONCE YOUR COVID TEST IS DONE PLEASE FOLLOW ALL THE QUARANTINE  INSTRUCTIONS GIVEN IN YOUR HANDOUT.      Your procedure is scheduled on  04-23-2020  Report to [facility].   Call this number if you have problems the morning of surgery  :[phone].   OUR ADDRESS IS [place].  WE ARE LOCATED IN [facility].  PLEASE BRING YOUR INSURANCE CARD AND PHOTO ID DAY OF SURGERY.  ONLY ONE PERSON ALLOWED IN FACILITY WAITING AREA.                                     REMEMBER:  DO NOT EAT FOOD, CANDY GUM OR MINTS  AFTER MIDNIGHT . YOU MAY HAVE CLEAR LIQUIDS FROM MIDNIGHT UNTIL  430 AM. NO CLEAR LIQUIDS AFTER 430 AM DAY OF SURGERY.   YOU MAY  BRUSH YOUR TEETH MORNING OF SURGERY AND RINSE YOUR MOUTH OUT, NO CHEWING GUM CANDY OR MINTS.    CLEAR LIQUID DIET   Foods Allowed                                                                     Foods Excluded  Coffee and tea, regular and decaf                             liquids that you cannot  Plain Jell-O any favor except red or purple                                           see through such as: Fruit ices (not with fruit pulp)                                     milk, soups, orange juice  Iced Popsicles                                    All solid food Carbonated beverages, regular and diet                                    Cranberry, grape and apple juices Sports drinks like Gatorade Lightly seasoned clear broth or consume(fat free) Sugar, honey syrup  Sample Menu Breakfast                                Lunch  Supper Cranberry juice                    Beef broth                            Chicken  broth Jell-O                                     Grape juice                           Apple juice Coffee or tea                        Jell-O                                      Popsicle                                                Coffee or tea                        Coffee or tea  _____________________________________________________________________     TAKE THESE MEDICATIONS MORNING OF SURGERY WITH A SIP OF WATER:  FLONASE, WIXELA INHALER AND BRING INHALER WITH YOU.   ONE VISITOR IS ALLOWED IN WAITING ROOM ONLY DAY OF SURGERY.  NO VISITOR MAY SPEND THE NIGHT.  VISITOR ARE ALLOWED TO STAY UNTIL 800 PM.                                    DO NOT WEAR JEWERLY, MAKE UP. DO NOT WEAR LOTIONS, POWDERS, PERFUMES OR DEODORANT. DO NOT SHAVE FOR 24 HOURS PRIOR TO DAY OF SURGERY. MEN MAY SHAVE FACE AND NECK. CONTACTS, GLASSES, OR DENTURES MAY NOT BE WORN TO SURGERY.                                    [facility] IS NOT RESPONSIBLE  FOR ANY BELONGINGS.                                                                    Marland Kitchen            - Preparing for Surgery Before surgery, you can play an important role.  Because skin is not sterile, your skin needs to be as free of germs as possible.  You can reduce the number of germs on your skin by washing with CHG (chlorahexidine gluconate) soap before surgery.  CHG is an antiseptic cleaner which kills germs and bonds with the skin to continue killing germs even after washing. Please DO NOT use if you have an allergy to CHG or antibacterial  soaps.  If your skin becomes reddened/irritated stop using the CHG and inform your nurse when you arrive at Short Stay. Do not shave (including legs and underarms) for at least 48 hours prior to the first CHG shower.  You may shave your face/neck. Please follow these instructions carefully:  1.  Shower with CHG Soap the night before surgery and the  morning of Surgery.  2.  If you choose to wash your hair, wash  your hair first as usual with your  normal  shampoo.  3.  After you shampoo, rinse your hair and body thoroughly to remove the  shampoo.                            4.  Use CHG as you would any other liquid soap.  You can apply chg directly  to the skin and wash                      Gently with a scrungie or clean washcloth.  5.  Apply the CHG Soap to your body ONLY FROM THE NECK DOWN.   Do not use on face/ open                           Wound or open sores. Avoid contact with eyes, ears mouth and genitals (private parts).                       Wash face,  Genitals (private parts) with your normal soap.             6.  Wash thoroughly, paying special attention to the area where your surgery  will be performed.  7.  Thoroughly rinse your body with warm water from the neck down.  8.  DO NOT shower/wash with your normal soap after using and rinsing off  the CHG Soap.                9.  Pat yourself dry with a clean towel.            10.  Wear clean pajamas.            11.  Place clean sheets on your bed the night of your first shower and do not  sleep with pets. Day of Surgery : Do not apply any lotions/deodorants the morning of surgery.  Please wear clean clothes to the hospital/surgery center.  FAILURE TO FOLLOW THESE INSTRUCTIONS MAY RESULT IN THE CANCELLATION OF YOUR SURGERY PATIENT SIGNATURE_________________________________  NURSE SIGNATURE__________________________________  ________________________________________________________________________                                                        QUESTIONS Tammy Eaton PRE OP NURSE PHONE [phone]

## 2020-04-17 NOTE — Progress Notes (Addendum)
Spoke w/ via phone for pre-op interview---pt Lab needs dos----none               Lab results------see below COVID test ------04-19-2020 1415 Arrive at -------530 am 04-23-2020 NPO after MN NO Solid Food.  Clear liquids from MN until--- Med rec completed Medications to take morning of surgery -----flonase, wixela inhaler Bring day of surgery)n/a Diabetic medication ----- Patient instructed to bring photo id and insurance card day of surgery Patient aware to have Driver (ride ) / caregiver spouse donald will drop pt off    for 24 hours after surgery  Patient Special Instructions -----pt given extended stay instructions Pre-Op special Istructions -----none Patient verbalized understanding of instructions that were given at this phone interview. Patient denies shortness of breath, chest pain, fever, cough at this phone interview.  Anesthesia Review:hx of left lung cancer 2005, s/p left lung lobectomy, copd, sob with heavy lifting , pt denies cardiac S & S, pt denies sob, or any breathing issues at pre op call  [facility] [place] Cardiologist :lov dr Harrington Challenger 12-24-2018 follow up prn per stress test note 01-21-2019 epic Chest x-ray :chest ct 03-07-2020 EKG :12-24-2018 epic Echo :01-12-2019 epic Stress test:01-21-2019 epic Cardiac Cath : none Activity level: very active works in yard and dose a lot of housework Cardiothoracic lov dr hendrickson 04-10-2020 epic Pet scan 04-09-2020 epic lov pulmonary dr ellsion epic 12-02-2019 pulmonary function test 04-11-2019 epic Sleep Study/ CPAP :none Fasting Blood Sugar :      / Checks Blood Sugar -- times a day:  n/a Blood Thinner/ Instructions /Last Dose:n/a ASA / Instructions/ Last Dose : n/a

## 2020-04-18 ENCOUNTER — Other Ambulatory Visit: Payer: Self-pay | Admitting: Obstetrics and Gynecology

## 2020-04-18 MED ORDER — ACETAMINOPHEN 500 MG PO TABS: 500.0000 mg | ORAL_TABLET | Freq: Four times a day (QID) | ORAL | 0 refills | Status: DC | PRN

## 2020-04-18 MED ORDER — OXYCODONE HCL 5 MG PO TABS: 5.0000 mg | ORAL_TABLET | ORAL | 0 refills | Status: DC | PRN

## 2020-04-18 NOTE — H&P (Signed)
[facility] Pre-Operative H&P  Subjective Chief Complaint: Tammy Eaton presents for a preoperative encounter.   History of Present Illness: Tammy Eaton is a 70 y.o. female who presents for preoperative visit.  She is scheduled to undergo Exam under anesthesia, total vaginal hysterectomy, bilateral salpingo-oophorectomy, uterosacral ligament suspension, anterior repair, cystoscopy, possible laparoscopy on 04/23/20.  Her symptoms include vaginal bulge, and she was was found to have Stage I anterior, Stage I posterior, Stage II apical prolapse.  Urodynamics showed: 1. Sensation wasnormal; capacity wasnormal 2. Stress Incontinencewas notdemonstrated; 3. Detrusor Overactivitywas notdemonstrated. 4. Emptying wasdysfunctionalwith a elevatedPVR- unable to void.  Past Medical History:  Diagnosis Date  . Anxiety   . Arthritis 2018  . Bulging lumbar disc 2018  . Complication of anesthesia    "low blood pressure after retinal detachment surgery"  . Constipation   . COPD (chronic obstructive pulmonary disease) ([place])   . Coronary artery disease    follows up with dr Harrington Challenger prn  . Degenerative disc disease, lumbar 2018  . Depression   . Emphysema of lung ([place])   . Family history of ovarian cancer    mother died of ovarian cancer  . GERD (gastroesophageal reflux disease)   . History of hiatal hernia    small  . History of kidney stones   . History of pneumonia    "multiple times"  . History of stomach ulcers yrs ago  . Hyperlipidemia   . Hyperlipidemia   . lung ca 05/2003  . Multiple lung nodules    followed by dr Sherlon Handing 04-10-2020  . Osteoarthritis    spine, knees, hands  . PONV (postoperative nausea and vomiting)    pt "could not eat during hospital stay"  . Reflux esophagitis   . Scoliosis 2018  . Shortness of breath    with lifting heavy things  . Uterovaginal prolapse   . Wears glasses      Past Surgical History:  Procedure  Laterality Date  . BREAST EXCISIONAL BIOPSY  yrs ago  . BREAST LUMPECTOMY WITH NEEDLE LOCALIZATION Right 04/11/2013   Procedure: BREAST LUMPECTOMY WITH NEEDLE LOCALIZATION;  Surgeon: Odis Hollingshead, MD;  Location: [facility];  Service: General;  Laterality: Right;  . BREAST SURGERY  1992   lt br bx  . CHOLECYSTECTOMY  11/11/2011   Procedure: LAPAROSCOPIC CHOLECYSTECTOMY WITH INTRAOPERATIVE CHOLANGIOGRAM;  Surgeon: Odis Hollingshead, MD;  Location: [facility];  Service: General;  Laterality: N/A;  . CYSTOSCOPY  12/10   for evaluation of hematuria  . CYSTOSCOPY W/ URETERAL STENT PLACEMENT Left 11/16/2019   Procedure: CYSTOSCOPY WITH STENT REPLACEMENT;  Surgeon: Raynelle Bring, MD;  Location: [facility];  Service: Urology;  Laterality: Left;  . DILATION AND CURETTAGE OF UTERUS  yrs ago   x 2  . ESOPHAGOGASTRODUODENOSCOPY  yrs ago  . EYE SURGERY  yrs ago   catataracts-both  . KNEE SURGERY  yrs ago   rt knee torn cartilage  . LUNG LOBECTOMY Left 4/05  . RETINAL DETACHMENT SURGERY  2011   left; x2  . SINUS SURGERY WITH INSTATRAK  yrs ago  . TUBAL LIGATION  yrs ago  . URETEROSCOPY WITH HOLMIUM LASER LITHOTRIPSY Left 11/29/2019   Procedure: CYSTOSCOPY; LEFT URETEROSCOPY; HOLMIUM LASER LITHOTRIPSY; LEFT URETERAL STENT EXCHANGE;  Surgeon: Festus Aloe, MD;  Location: [facility];  Service: Urology;  Laterality: Left;  ONLY NEEDS 60 MIN  . VIDEO BRONCHOSCOPY WITH ENDOBRONCHIAL NAVIGATION N/A 01/15/2015   Procedure: VIDEO BRONCHOSCOPY WITH ENDOBRONCHIAL  NAVIGATION;  Surgeon: [name], MD;  [facility];  Service: Thoracic;  Laterality: N/A;  . VOCAL CORD LATERALIZATION, ENDOSCOPIC APPROACH W/ MLB  yrs ago   nodule removed    is allergic to adhesive [tape], septra [sulfamethoxazole-trimethoprim], tramadol hcl, and amoxicillin.   Family History  Problem Relation Age of Onset  . Cancer Mother 19       ovarian cancer  . Hypertension Mother   . Diabetes Mother   . Alcohol  abuse Father     Social History   Tobacco Use  . Smoking status: Former Smoker    Packs/day: 2.00    Years: 45.00    Pack years: 90.00    Types: Cigarettes    Start date: 1965    Quit date: 09/21/2008    Years since quitting: 11.5  . Smokeless tobacco: Never Used  Vaping Use  . Vaping Use: Never used  Substance Use Topics  . Alcohol use: Yes    Alcohol/week: 0.0 - 1.0 standard drinks    Comment: occ  . Drug use: No     Review of Systems was negative for a full 10 system review except as noted in the History of Present Illness.  No current facility-administered medications for this encounter.  Current Outpatient Medications:  .  docusate sodium (COLACE) 100 MG capsule, Take 100 mg by mouth daily., Disp: , Rfl:  .  esomeprazole (NEXIUM) 20 MG capsule, Take 20 mg by mouth at bedtime., Disp: , Rfl:  .  UNABLE TO FIND, Vitamin d  2000 iu daily, Disp: , Rfl:  .  acetaminophen-codeine (TYLENOL #3) 300-30 MG tablet, Take 1 tablet by mouth every 8 (eight) hours as needed for moderate pain., Disp: , Rfl:  .  amitriptyline (ELAVIL) 25 MG tablet, Take 100 mg by mouth at bedtime., Disp: , Rfl:  .  baclofen (LIORESAL) 10 MG tablet, Take 10 mg by mouth 2 (two) times daily as needed for muscle spasms. , Disp: , Rfl:  .  celecoxib (CELEBREX) 200 MG capsule, Take 200 mg by mouth daily. Pt has discontinued, Disp: , Rfl:  .  clonazePAM (KLONOPIN) 1 MG tablet, Take 1 mg by mouth at bedtime., Disp: , Rfl:  .  fluticasone (FLONASE) 50 MCG/ACT nasal spray, Place 2 sprays into both nostrils daily., Disp: , Rfl:  .  Fluticasone-Salmeterol (WIXELA INHUB) 250-50 MCG/DOSE AEPB, Inhale 1 puff into the lungs in the morning and at bedtime., Disp: 60 each, Rfl: 12 .  LINZESS 72 MCG capsule, Take 72 mcg by mouth daily before breakfast., Disp: , Rfl:  .  Omega-3 Fatty Acids (OMEGA 3 PO), Take 1 capsule by mouth daily., Disp: , Rfl:  .  polyvinyl alcohol (LIQUIFILM TEARS) 1.4 % ophthalmic solution, Place 1 drop  into both eyes as needed for dry eyes., Disp: , Rfl:  .  traMADol (ULTRAM) 50 MG tablet, Take by mouth every 6 (six) hours as needed., Disp: , Rfl:    Objective Gen: No distress, AAOx3  Previous Pelvic Exam showed:  POP-Q  -2 Aa  -2 Ba  1 C   3 Gh  3 Pb  8 tvl   -3 Ap  -3 Bp  -6.5 D      Assessment/ Plan  Assessment: The patient is a 70 y.o. year old with Stage II Pelvic organ prolapse  Plan: Exam under anesthesia, total vaginal hysterectomy, bilateral salpingo-oophorectomy, uterosacral ligament suspension, anterior repair, cystoscopy, possible laparoscopy.   Co-case with Dr Ival Bible  Mechele Claude, MD

## 2020-04-19 ENCOUNTER — Other Ambulatory Visit: Payer: Self-pay

## 2020-04-19 ENCOUNTER — Other Ambulatory Visit (HOSPITAL_COMMUNITY)
Admission: RE | Admit: 2020-04-19 | Discharge: 2020-04-19 | Disposition: A | Discharge status: Home / Self Care | Payer: Medicare Other | Source: Ambulatory Visit | Attending: Obstetrics & Gynecology | Admitting: Obstetrics & Gynecology

## 2020-04-19 ENCOUNTER — Encounter (HOSPITAL_COMMUNITY)
Admission: RE | Admit: 2020-04-19 | Discharge: 2020-04-19 | Disposition: A | Discharge status: Home / Self Care | Payer: Medicare Other | Source: Ambulatory Visit | Attending: Obstetrics & Gynecology | Admitting: Obstetrics & Gynecology

## 2020-04-19 ENCOUNTER — Other Ambulatory Visit (HOSPITAL_BASED_OUTPATIENT_CLINIC_OR_DEPARTMENT_OTHER): Payer: Self-pay | Admitting: Obstetrics & Gynecology

## 2020-04-19 DIAGNOSIS — Z20822 Contact with and (suspected) exposure to covid-19: Secondary | ICD-10-CM | POA: Insufficient documentation

## 2020-04-19 DIAGNOSIS — Z01818 Encounter for other preprocedural examination: Secondary | ICD-10-CM | POA: Insufficient documentation

## 2020-04-19 DIAGNOSIS — Z01812 Encounter for preprocedural laboratory examination: Secondary | ICD-10-CM | POA: Diagnosis not present

## 2020-04-19 LAB — BASIC METABOLIC PANEL
Anion gap: 8 (ref 5–15)
BUN: 17 mg/dL (ref 8–23)
CO2: 24 mmol/L (ref 22–32)
Calcium: 9 mg/dL (ref 8.9–10.3)
Chloride: 109 mmol/L (ref 98–111)
Creatinine, Ser: 0.68 mg/dL (ref 0.44–1.00)
GFR, Estimated: >60 mL/min (ref >60)
Glucose, Bld: 104 mg/dL — ABNORMAL HIGH (ref 70–99)
Potassium: 3.8 mmol/L (ref 3.5–5.1)
Sodium: 141 mmol/L (ref 135–145)

## 2020-04-19 LAB — CBC
HCT: 46.2 % — ABNORMAL HIGH (ref 36.0–46.0)
Hemoglobin: 14.9 g/dL (ref 12.0–15.0)
MCH: 29.2 pg (ref 26.0–34.0)
MCHC: 32.3 g/dL (ref 30.0–36.0)
MCV: 90.4 fL (ref 80.0–100.0)
Platelets: 263 10*3/uL (ref 150–400)
RBC: 5.11 MIL/uL (ref 3.87–5.11)
RDW: 13.3 % (ref 11.5–15.5)
WBC: 5.4 10*3/uL (ref 4.0–10.5)
nRBC: 0 % (ref 0.0–0.2)

## 2020-04-19 LAB — SARS CORONAVIRUS 2 (TAT 6-24 HRS): SARS Coronavirus 2: NEGATIVE

## 2020-04-22 NOTE — Anesthesia Preprocedure Evaluation (Addendum)
Anesthesia Evaluation  Patient identified by MRN, date of birth, ID band Patient awake    Reviewed: Allergy & Precautions, NPO status , Patient's Chart, lab work & pertinent test results  History of Anesthesia Complications (+) PONV and history of anesthetic complications  Airway Mallampati: I  TM Distance: >3 FB Neck ROM: Full    Dental no notable dental hx.    Pulmonary COPD (maintenance inhaler twice a day),  COPD inhaler, former smoker,    Pulmonary exam normal  + decreased breath sounds (left upper)      Cardiovascular Exercise Tolerance: Good negative cardio ROS Normal cardiovascular exam Rhythm:Regular Rate:Normal  Normal sinus rhythm Left axis deviation Low voltage QRS Septal infarct , age undetermined vs lead placement new since previous Abnormal ECG Confirmed by Kirk Ruths [phone]) on 04/19/2020 1:37:41 PM   Neuro/Psych PSYCHIATRIC DISORDERS Anxiety Depression    GI/Hepatic Neg liver ROS, hiatal hernia, GERD  ,  Endo/Other  negative endocrine ROS  Renal/GU negative Renal ROS  Female GU complaint     Musculoskeletal  (+) Arthritis ,   Abdominal Normal abdominal exam  (+)   Peds negative pediatric ROS (+)  Hematology negative hematology ROS (+)   Anesthesia Other Findings Lung cancer s/p lobectomy (left upper lobe)  Reproductive/Obstetrics negative OB ROS                            Anesthesia Physical Anesthesia Plan  ASA: III  Anesthesia Plan: General   Post-op Pain Management:    Induction: Intravenous  PONV Risk Score and Plan: 4 or greater and Midazolam, Treatment may vary due to age or medical condition, Ondansetron, Dexamethasone and Propofol infusion  Airway Management Planned: Oral ETT  Additional Equipment: None  Intra-op Plan:   Post-operative Plan:   Informed Consent: I have reviewed the patients History and Physical, chart, labs and discussed  the procedure including the risks, benefits and alternatives for the proposed anesthesia with the patient or authorized representative who has indicated his/her understanding and acceptance.     Dental advisory given  Plan Discussed with: CRNA, Anesthesiologist and Surgeon  Anesthesia Plan Comments:        Anesthesia Quick Evaluation

## 2020-04-23 ENCOUNTER — Ambulatory Visit (HOSPITAL_BASED_OUTPATIENT_CLINIC_OR_DEPARTMENT_OTHER): Payer: Medicare Other | Admitting: Anesthesiology

## 2020-04-23 ENCOUNTER — Telehealth: Payer: Self-pay | Admitting: Obstetrics and Gynecology

## 2020-04-23 ENCOUNTER — Other Ambulatory Visit (HOSPITAL_BASED_OUTPATIENT_CLINIC_OR_DEPARTMENT_OTHER): Payer: Self-pay | Admitting: Obstetrics & Gynecology

## 2020-04-23 ENCOUNTER — Encounter (HOSPITAL_BASED_OUTPATIENT_CLINIC_OR_DEPARTMENT_OTHER)
Admission: RE | Disposition: A | Discharge status: Home / Self Care | Payer: Self-pay | Source: Home / Self Care | Attending: Obstetrics and Gynecology

## 2020-04-23 ENCOUNTER — Encounter (HOSPITAL_BASED_OUTPATIENT_CLINIC_OR_DEPARTMENT_OTHER): Payer: Self-pay | Admitting: Obstetrics and Gynecology

## 2020-04-23 ENCOUNTER — Ambulatory Visit (HOSPITAL_BASED_OUTPATIENT_CLINIC_OR_DEPARTMENT_OTHER)
Admission: RE | Admit: 2020-04-23 | Discharge: 2020-04-23 | Disposition: A | Discharge status: Home / Self Care | Payer: Medicare Other | Attending: Obstetrics and Gynecology | Admitting: Obstetrics and Gynecology

## 2020-04-23 DIAGNOSIS — Z7951 Long term (current) use of inhaled steroids: Secondary | ICD-10-CM | POA: Insufficient documentation

## 2020-04-23 DIAGNOSIS — Z882 Allergy status to sulfonamides status: Secondary | ICD-10-CM | POA: Insufficient documentation

## 2020-04-23 DIAGNOSIS — Z8041 Family history of malignant neoplasm of ovary: Secondary | ICD-10-CM | POA: Insufficient documentation

## 2020-04-23 DIAGNOSIS — J439 Emphysema, unspecified: Secondary | ICD-10-CM | POA: Insufficient documentation

## 2020-04-23 DIAGNOSIS — Z91048 Other nonmedicinal substance allergy status: Secondary | ICD-10-CM | POA: Diagnosis not present

## 2020-04-23 DIAGNOSIS — Z885 Allergy status to narcotic agent status: Secondary | ICD-10-CM | POA: Insufficient documentation

## 2020-04-23 DIAGNOSIS — Z8249 Family history of ischemic heart disease and other diseases of the circulatory system: Secondary | ICD-10-CM | POA: Diagnosis not present

## 2020-04-23 DIAGNOSIS — Z87891 Personal history of nicotine dependence: Secondary | ICD-10-CM | POA: Insufficient documentation

## 2020-04-23 DIAGNOSIS — D259 Leiomyoma of uterus, unspecified: Secondary | ICD-10-CM | POA: Diagnosis not present

## 2020-04-23 DIAGNOSIS — Z85118 Personal history of other malignant neoplasm of bronchus and lung: Secondary | ICD-10-CM | POA: Insufficient documentation

## 2020-04-23 DIAGNOSIS — Z833 Family history of diabetes mellitus: Secondary | ICD-10-CM | POA: Insufficient documentation

## 2020-04-23 DIAGNOSIS — N8110 Cystocele, unspecified: Secondary | ICD-10-CM

## 2020-04-23 DIAGNOSIS — Z79899 Other long term (current) drug therapy: Secondary | ICD-10-CM | POA: Insufficient documentation

## 2020-04-23 DIAGNOSIS — Z791 Long term (current) use of non-steroidal anti-inflammatories (NSAID): Secondary | ICD-10-CM | POA: Diagnosis not present

## 2020-04-23 DIAGNOSIS — Z881 Allergy status to other antibiotic agents status: Secondary | ICD-10-CM | POA: Diagnosis not present

## 2020-04-23 DIAGNOSIS — N812 Incomplete uterovaginal prolapse: Secondary | ICD-10-CM | POA: Diagnosis not present

## 2020-04-23 HISTORY — PX: CYSTOSCOPY: SHX5120

## 2020-04-23 HISTORY — DX: Other nonspecific abnormal finding of lung field: R91.8

## 2020-04-23 HISTORY — DX: Gastro-esophageal reflux disease without esophagitis: K21.9

## 2020-04-23 HISTORY — DX: Constipation, unspecified: K59.00

## 2020-04-23 HISTORY — DX: Personal history of urinary calculi: Z87.442

## 2020-04-23 HISTORY — DX: Personal history of peptic ulcer disease: Z87.11

## 2020-04-23 HISTORY — DX: Family history of malignant neoplasm of ovary: Z80.41

## 2020-04-23 HISTORY — DX: Atherosclerotic heart disease of native coronary artery without angina pectoris: I25.10

## 2020-04-23 HISTORY — PX: VAGINAL PROLAPSE REPAIR: SHX830

## 2020-04-23 HISTORY — DX: Personal history of other diseases of the digestive system: Z87.19

## 2020-04-23 HISTORY — DX: Uterovaginal prolapse, unspecified: N81.4

## 2020-04-23 HISTORY — PX: VAGINAL HYSTERECTOMY: SHX2639

## 2020-04-23 LAB — TYPE AND SCREEN
ABO/RH(D): O POS
Antibody Screen: NEGATIVE

## 2020-04-23 LAB — ABO/RH: ABO/RH(D): O POS

## 2020-04-23 SURGERY — HYSTERECTOMY, VAGINAL
Anesthesia: General

## 2020-04-23 MED ORDER — ONDANSETRON HCL 4 MG/2ML IJ SOLN: 4.0000 mg | Freq: Once | INTRAMUSCULAR | Status: DC | PRN

## 2020-04-23 MED ORDER — MIDAZOLAM HCL 2 MG/2ML IJ SOLN
INTRAMUSCULAR | Status: AC
  Filled 2020-04-23: qty 2

## 2020-04-23 MED ORDER — ONDANSETRON HCL 4 MG/2ML IJ SOLN: 4.0000 mg | Freq: Four times a day (QID) | INTRAMUSCULAR | Status: DC | PRN

## 2020-04-23 MED ORDER — POVIDONE-IODINE 10 % EX SWAB: 2.0000 "application " | Freq: Once | CUTANEOUS | Status: DC

## 2020-04-23 MED ORDER — PHENAZOPYRIDINE HCL 100 MG PO TABS
ORAL_TABLET | ORAL | Status: AC
  Filled 2020-04-23: qty 2

## 2020-04-23 MED ORDER — ROCURONIUM BROMIDE 10 MG/ML (PF) SYRINGE
PREFILLED_SYRINGE | INTRAVENOUS | Status: AC
  Filled 2020-04-23: qty 10

## 2020-04-23 MED ORDER — CEFAZOLIN SODIUM-DEXTROSE 2-4 GM/100ML-% IV SOLN
INTRAVENOUS | Status: AC
  Filled 2020-04-23: qty 100

## 2020-04-23 MED ORDER — LACTATED RINGERS IV SOLN: INTRAVENOUS | Status: DC

## 2020-04-23 MED ORDER — LIDOCAINE 2% (20 MG/ML) 5 ML SYRINGE
INTRAMUSCULAR | Status: AC
  Filled 2020-04-23: qty 5

## 2020-04-23 MED ORDER — PROPOFOL 10 MG/ML IV BOLUS
INTRAVENOUS | Status: DC | PRN
  Administered 2020-04-23: 120 mg via INTRAVENOUS

## 2020-04-23 MED ORDER — KETOROLAC TROMETHAMINE 15 MG/ML IJ SOLN
INTRAMUSCULAR | Status: AC
  Filled 2020-04-23: qty 1

## 2020-04-23 MED ORDER — OXYCODONE HCL 5 MG PO TABS: 5.0000 mg | ORAL_TABLET | Freq: Once | ORAL | Status: DC | PRN

## 2020-04-23 MED ORDER — OXYCODONE HCL 5 MG/5ML PO SOLN: 5.0000 mg | Freq: Once | ORAL | Status: DC | PRN

## 2020-04-23 MED ORDER — ROCURONIUM BROMIDE 10 MG/ML (PF) SYRINGE
PREFILLED_SYRINGE | INTRAVENOUS | Status: DC | PRN
  Administered 2020-04-23: 70 mg via INTRAVENOUS
  Administered 2020-04-23: 10 mg via INTRAVENOUS

## 2020-04-23 MED ORDER — PHENAZOPYRIDINE HCL 100 MG PO TABS
200.0000 mg | ORAL_TABLET | Freq: Three times a day (TID) | ORAL | Status: DC
  Administered 2020-04-23: 200 mg via ORAL

## 2020-04-23 MED ORDER — KETOROLAC TROMETHAMINE 15 MG/ML IJ SOLN
15.0000 mg | Freq: Once | INTRAMUSCULAR | Status: AC
  Administered 2020-04-23: 15 mg via INTRAVENOUS

## 2020-04-23 MED ORDER — SODIUM CHLORIDE 0.9 % IR SOLN
Status: DC | PRN
  Administered 2020-04-23: 1000 mL

## 2020-04-23 MED ORDER — FENTANYL CITRATE (PF) 100 MCG/2ML IJ SOLN
INTRAMUSCULAR | Status: DC | PRN
  Administered 2020-04-23 (×5): 50 ug via INTRAVENOUS

## 2020-04-23 MED ORDER — PROPOFOL 500 MG/50ML IV EMUL
INTRAVENOUS | Status: AC
  Filled 2020-04-23: qty 50

## 2020-04-23 MED ORDER — OXYCODONE HCL 5 MG PO TABS
ORAL_TABLET | ORAL | Status: AC
  Filled 2020-04-23: qty 1

## 2020-04-23 MED ORDER — LIDOCAINE-EPINEPHRINE 1 %-1:100000 IJ SOLN
INTRAMUSCULAR | Status: DC | PRN
  Administered 2020-04-23: 6 mL

## 2020-04-23 MED ORDER — GABAPENTIN 300 MG PO CAPS
ORAL_CAPSULE | ORAL | Status: AC
  Filled 2020-04-23: qty 1

## 2020-04-23 MED ORDER — OXYCODONE HCL 5 MG PO TABS
5.0000 mg | ORAL_TABLET | ORAL | Status: DC | PRN
  Administered 2020-04-23: 5 mg via ORAL

## 2020-04-23 MED ORDER — GABAPENTIN 300 MG PO CAPS: 300.0000 mg | ORAL_CAPSULE | ORAL | Status: DC

## 2020-04-23 MED ORDER — MIDAZOLAM HCL 2 MG/2ML IJ SOLN
INTRAMUSCULAR | Status: DC | PRN
  Administered 2020-04-23: 2 mg via INTRAVENOUS

## 2020-04-23 MED ORDER — DEXAMETHASONE SODIUM PHOSPHATE 10 MG/ML IJ SOLN
INTRAMUSCULAR | Status: AC
  Filled 2020-04-23: qty 1

## 2020-04-23 MED ORDER — ONDANSETRON 4 MG PO TBDP
ORAL_TABLET | ORAL | Status: AC
  Filled 2020-04-23: qty 1

## 2020-04-23 MED ORDER — IBUPROFEN 800 MG PO TABS: 800.0000 mg | ORAL_TABLET | Freq: Three times a day (TID) | ORAL | Status: DC

## 2020-04-23 MED ORDER — FENTANYL CITRATE (PF) 100 MCG/2ML IJ SOLN
INTRAMUSCULAR | Status: AC
  Filled 2020-04-23: qty 2

## 2020-04-23 MED ORDER — FENTANYL CITRATE (PF) 100 MCG/2ML IJ SOLN
25.0000 ug | INTRAMUSCULAR | Status: DC | PRN
Start: 2020-04-23 — End: 2020-04-23
  Administered 2020-04-23 (×3): 25 ug via INTRAVENOUS

## 2020-04-23 MED ORDER — CEFAZOLIN SODIUM-DEXTROSE 2-4 GM/100ML-% IV SOLN
2.0000 g | INTRAVENOUS | Status: AC
  Administered 2020-04-23: 2 g via INTRAVENOUS

## 2020-04-23 MED ORDER — ACETAMINOPHEN 500 MG PO TABS
ORAL_TABLET | ORAL | Status: AC
  Filled 2020-04-23: qty 2

## 2020-04-23 MED ORDER — ONDANSETRON HCL 4 MG/2ML IJ SOLN
INTRAMUSCULAR | Status: DC | PRN
  Administered 2020-04-23: 4 mg via INTRAVENOUS

## 2020-04-23 MED ORDER — ONDANSETRON HCL 4 MG/2ML IJ SOLN
INTRAMUSCULAR | Status: AC
  Filled 2020-04-23: qty 2

## 2020-04-23 MED ORDER — LIDOCAINE-EPINEPHRINE 1 %-1:100000 IJ SOLN
INTRAMUSCULAR | Status: AC
  Filled 2020-04-23: qty 1

## 2020-04-23 MED ORDER — LIDOCAINE 2% (20 MG/ML) 5 ML SYRINGE
INTRAMUSCULAR | Status: DC | PRN
  Administered 2020-04-23: 60 mg via INTRAVENOUS

## 2020-04-23 MED ORDER — AMISULPRIDE (ANTIEMETIC) 5 MG/2ML IV SOLN: 10.0000 mg | Freq: Once | INTRAVENOUS | Status: DC | PRN

## 2020-04-23 MED ORDER — ONDANSETRON HCL 4 MG PO TABS
4.0000 mg | ORAL_TABLET | Freq: Four times a day (QID) | ORAL | Status: DC | PRN
  Administered 2020-04-23: 4 mg via ORAL

## 2020-04-23 MED ORDER — DEXAMETHASONE SODIUM PHOSPHATE 10 MG/ML IJ SOLN
INTRAMUSCULAR | Status: DC | PRN
  Administered 2020-04-23: 10 mg via INTRAVENOUS

## 2020-04-23 MED ORDER — SUGAMMADEX SODIUM 200 MG/2ML IV SOLN
INTRAVENOUS | Status: DC | PRN
  Administered 2020-04-23: 200 mg via INTRAVENOUS

## 2020-04-23 MED ORDER — ACETAMINOPHEN 325 MG PO TABS: 650.0000 mg | ORAL_TABLET | ORAL | Status: DC | PRN

## 2020-04-23 MED ORDER — FENTANYL CITRATE (PF) 250 MCG/5ML IJ SOLN
INTRAMUSCULAR | Status: AC
  Filled 2020-04-23: qty 5

## 2020-04-23 MED ORDER — ACETAMINOPHEN 500 MG PO TABS
1000.0000 mg | ORAL_TABLET | ORAL | Status: AC
  Administered 2020-04-23: 1000 mg via ORAL

## 2020-04-23 SURGICAL SUPPLY — 62 items
AGENT HMST KT MTR STRL THRMB (HEMOSTASIS)
APL PRP STRL LF DISP 70% ISPRP (MISCELLANEOUS)
BLADE SURG 15 STRL LF DISP TIS (BLADE) ×1 IMPLANT
BLADE SURG 15 STRL SS (BLADE) ×2
CANISTER SUCT 3000ML PPV (MISCELLANEOUS) ×4 IMPLANT
CHLORAPREP W/TINT 26 (MISCELLANEOUS) IMPLANT
COVER WAND RF STERILE (DRAPES) ×4 IMPLANT
DECANTER SPIKE VIAL GLASS SM (MISCELLANEOUS) IMPLANT
DEVICE CAPIO SLIM SINGLE (INSTRUMENTS) IMPLANT
DRAPE STERI URO 9X17 APER PCH (DRAPES) ×2 IMPLANT
ELECT REM PT RETURN 9FT ADLT (ELECTROSURGICAL) ×2
ELECTRODE REM PT RTRN 9FT ADLT (ELECTROSURGICAL) ×1 IMPLANT
GAUZE 4X4 16PLY RFD (DISPOSABLE) ×2 IMPLANT
GAUZE PACKING 2X5 YD STRL (GAUZE/BANDAGES/DRESSINGS) IMPLANT
GLOVE SURG ENC MOIS LTX SZ6 (GLOVE) ×2 IMPLANT
GLOVE SURG LTX SZ6.5 (GLOVE) ×4 IMPLANT
GLOVE SURG UNDER POLY LF SZ6.5 (GLOVE) ×2 IMPLANT
GLOVE SURG UNDER POLY LF SZ7 (GLOVE) ×6 IMPLANT
GOWN STRL REUS W/TWL LRG LVL3 (GOWN DISPOSABLE) ×10 IMPLANT
HIBICLENS CHG 4% 4OZ (MISCELLANEOUS) ×2 IMPLANT
IV NS 1000ML (IV SOLUTION) ×2
IV NS 1000ML BAXH (IV SOLUTION) ×1 IMPLANT
KIT TURNOVER CYSTO (KITS) ×4 IMPLANT
LIGASURE IMPACT 36 18CM CVD LR (INSTRUMENTS) IMPLANT
MANIFOLD NEPTUNE II (INSTRUMENTS) ×2 IMPLANT
NDL SUT 6 .5 CRC .975X.05 MAYO (NEEDLE) ×1 IMPLANT
NEEDLE HYPO 22GX1.5 SAFETY (NEEDLE) ×2 IMPLANT
NEEDLE MAYO TAPER (NEEDLE) ×2
NEEDLE SPNL 22GX3.5 QUINCKE BK (NEEDLE) IMPLANT
NS IRRIG 1000ML POUR BTL (IV SOLUTION) ×4 IMPLANT
PACK CYSTO (CUSTOM PROCEDURE TRAY) ×2 IMPLANT
PACK VAGINAL WOMENS (CUSTOM PROCEDURE TRAY) ×4 IMPLANT
PAD OB MATERNITY 4.3X12.25 (PERSONAL CARE ITEMS) ×4 IMPLANT
PENCIL BUTTON HOLSTER BLD 10FT (ELECTRODE) ×2 IMPLANT
RETRACTOR LONE STAR DISPOSABLE (INSTRUMENTS) IMPLANT
RETRACTOR STAY HOOK 5MM (MISCELLANEOUS) IMPLANT
SET IRRIG Y TYPE TUR BLADDER L (SET/KITS/TRAYS/PACK) ×4 IMPLANT
SPONGE SURGIFOAM ABS GEL 100 (HEMOSTASIS) IMPLANT
SURGIFLO W/THROMBIN 8M KIT (HEMOSTASIS) IMPLANT
SUT ABS MONO DBL WITH NDL 48IN (SUTURE) IMPLANT
SUT CAPIO PGA 48IN SZ 0 (SUTURE) ×2 IMPLANT
SUT CV-0 GORETEX TFX25 36 (SUTURE) IMPLANT
SUT MON AB 2-0 SH 27 (SUTURE) IMPLANT
SUT PDS AB 0 CT1 36 (SUTURE) ×8 IMPLANT
SUT VIC AB 0 CT1 18XCR BRD8 (SUTURE) ×2 IMPLANT
SUT VIC AB 0 CT1 27 (SUTURE) ×2
SUT VIC AB 0 CT1 27XBRD ANBCTR (SUTURE) IMPLANT
SUT VIC AB 0 CT1 27XCR 8 STRN (SUTURE) ×1 IMPLANT
SUT VIC AB 0 CT1 36 (SUTURE) ×4 IMPLANT
SUT VIC AB 0 CT1 8-18 (SUTURE) ×4
SUT VIC AB 2-0 SH 27 (SUTURE) ×2
SUT VIC AB 2-0 SH 27XBRD (SUTURE) ×1 IMPLANT
SUT VIC AB 3-0 SH 18 (SUTURE) IMPLANT
SUT VICRYL 0 TIES 12 18 (SUTURE) IMPLANT
SUT VICRYL 2-0 SH 8X27 (SUTURE) IMPLANT
SYR BULB EAR ULCER 3OZ GRN STR (SYRINGE) ×2 IMPLANT
TOWEL OR 17X26 10 PK STRL BLUE (TOWEL DISPOSABLE) ×6 IMPLANT
TRAY FOL W/BAG SLVR 16FR STRL (SET/KITS/TRAYS/PACK) ×1 IMPLANT
TRAY FOLEY W/BAG SLVR 14FR LF (SET/KITS/TRAYS/PACK) ×2 IMPLANT
TRAY FOLEY W/BAG SLVR 16FR LF (SET/KITS/TRAYS/PACK) ×2
UNDERPAD 30X36 HEAVY ABSORB (UNDERPADS AND DIAPERS) ×2 IMPLANT
YANKAUER SUCT BULB TIP NO VENT (SUCTIONS) ×2 IMPLANT

## 2020-04-23 NOTE — Progress Notes (Signed)
Catheter drained and back filled with 300cc Normal Saline per order. Ambulated patient in hall as she stated she had no urge to void. Placed on toilet with water running to try and encourage voiding. Still unable to void, returned to bed and have ordered chicken noodle soup for nourishment. She continues to state that she will be unable to void and this is why MD sent her home with catheters.

## 2020-04-23 NOTE — Transfer of Care (Signed)
Immediate Anesthesia Transfer of Care Note  Patient: Tammy Eaton  Procedure(s) Performed: HYSTERECTOMY VAGINAL with bilateral salpingooopherectomy; (N/A ) VAGINAL VAULT SUSPENSION (N/A ) CYSTOSCOPY (N/A )  Patient Location: PACU  Anesthesia Type:General  Level of Consciousness: awake, alert  and oriented  Airway & Oxygen Therapy: Patient Spontanous Breathing and Patient connected to face mask oxygen  Post-op Assessment: Report given to RN and Post -op Vital signs reviewed and stable  Post vital signs: Reviewed and stable  Last Vitals:  Vitals Value Taken Time  BP 122/59 04/23/20 1015  Temp 36.9 C 04/23/20 1013  Pulse 80 04/23/20 1019  Resp 17 04/23/20 1019  SpO2 100 % 04/23/20 1019  Vitals shown include unvalidated device data.  Last Pain:  Vitals:   04/23/20 0615  PainSc: 0-No pain      Patients Stated Pain Goal: 5 (54/65/68 1275)  Complications: No complications documented.

## 2020-04-23 NOTE — Anesthesia Procedure Notes (Signed)
Procedure Name: Intubation Date/Time: 04/23/2020 7:48 AM Performed by: Genelle Bal, CRNA Pre-anesthesia Checklist: Patient identified, Emergency Drugs available, Suction available and Patient being monitored Patient Re-evaluated:Patient Re-evaluated prior to induction Oxygen Delivery Method: Circle system utilized Preoxygenation: Pre-oxygenation with 100% oxygen Induction Type: IV induction Ventilation: Mask ventilation without difficulty Laryngoscope Size: Miller and 2 Grade View: Grade I Tube type: Oral Tube size: 7.0 mm Number of attempts: 1 Airway Equipment and Method: Stylet Placement Confirmation: ETT inserted through vocal cords under direct vision,  positive ETCO2 and breath sounds checked- equal and bilateral Secured at: 21 cm Tube secured with: Tape Dental Injury: Teeth and Oropharynx as per pre-operative assessment

## 2020-04-23 NOTE — Progress Notes (Signed)
Patient encouraged to use incentive spirometer and did. Began to C/O increased pain after use. She is very negative when explanations given regarding progress needed for discharge. States she has no interest in eating and doubts very seriously that she will be able to successfully complete voiding trial.  Encouragement given to rest for a while before any further attempts made.

## 2020-04-23 NOTE — Progress Notes (Signed)
Dr. Sherlene Shams is going to be primary surgeon for this case I am an the assistant surgeon.  Reviewed with pt.  Second consent form removed.

## 2020-04-23 NOTE — Interval H&P Note (Signed)
History and Physical Interval Note:  04/23/2020 7:03 AM  Tammy Eaton  has presented today for surgery, with the diagnosis of uterovaginal prolapse, incomplete; anterior vaginal wall prolapse.  The various methods of treatment have been discussed with the patient and family. After consideration of risks, benefits and other options for treatment, the patient has consented to  Procedure(s) with comments: HYSTERECTOMY VAGINAL with bilateral salpingooopherectomy; possible laparoscopy (N/A)  VAGINAL VAULT SUSPENSION (N/A) ANTERIOR AND POSTERIOR REPAIR WITH SACROSPINOUS FIXATION (N/A) CYSTOSCOPY (N/A) as a surgical intervention.  The patient's history has been reviewed, patient examined, no change in status, stable for surgery.    Vitals:   04/23/20 0615  BP: 128/80  Pulse: 81  Resp: 15  Temp: 97.8 F (36.6 C)  SpO2: 99%    Gen: NAD CV: S1 S2 RRR Lungs: Clear to auscultation bilaterally Abd: soft, nontender    I have reviewed the patient's chart and labs.  Questions were answered to the patient's satisfaction.     Jaquita Folds

## 2020-04-23 NOTE — Op Note (Signed)
Operative Note  Preoperative Diagnosis: uterovaginal prolapse, incomplete  Postoperative Diagnosis: same  Procedures performed:  Total vaginal hysterectomy with bilateral salpingo-oophorectomy, uterosacral ligament suspension, cystoscopy  Implants: * No implants in log *  Attending Surgeon: Sherlene Shams, MD  Assistant Surgeon: Lyman Speller, MD   Anesthesia: General endotracheal  Findings: 1. Stage II  Pelvic organ prolapse on vaginal exam  2. Normal appearing uterus, cervix and bilateral fallopian tubes  3. On cystoscopy, normal bladder and urethra without injury, lesion or foreign body. Brisk bilateral ureteral efflux noted.   Specimens:  ID Type Source Tests Collected by Time Destination  1 : UTERUS CERVIX BILATERAL TUBES AND OVARIES(PATIENT HAS FAMILY HISTORY OF OVARIAN CANCER, HAS HAD NO GENETIC TESTING) Tissue PATH Soft tissue SURGICAL PATHOLOGY Megan Salon, MD 04/23/2020 [phone]     Estimated blood loss: 50 mL  IV fluids: 1200 mL  Urine output: 474 mL  Complications: none  Procedure in Detail:  After informed consent was obtained, the patient was taken to the operating room where she was placed under anesthesia.  She was then placed in the dorsal lithotomy position with Allen stirrups and prepped and draped in the usual sterile fashion.  Care was taken to avoid hyperflexion or hyperextension of her lower extremities.    A self-retaining lonestar retractor was placed using four elastic blue stays, and a foley catheter was placed.  A Buffy Ehler weighted speculum was placed in the vagina.   The cervix was grasped with two single-tooth tenacula.  The cervix was injected circumferentially with 1% lidocaine with epinephrine. A 10 blade was used to incise circumferentially around the cervix.  The posterior vagina was grasped. The Mayo scissors were used to enter the posterior cul-de-sac. Palpation confirmed peritoneal entry and no adhesions. The posterior peritoneum was  affixed to the vaginal cuff with 0-Vicryl (this suture was used throughout unless otherwise specified) at the midline. A long-weighted speculum was placed through the posterior colpotomy. Anteriorly, the bladder was dissected off the pubocervical fascia using Metzenbaum scissors. A Deaver retractor was placed anteriorly to protect the bladder. The anterior peritoneal reflection was then identified, tented up, and incised with Metzenbaum scissors to create an anterior colpotomy.  Palpation confirmed peritoneal entry and no adhesions.A Heaney clamp was then used to clamp the uterosacral ligaments on each side. These were cut and suture ligated using 0-Vicryl in a Heaney fashion, and tagged with hemostats.  The Deaver was placed anteriorly to protect the bladder. The cardinal ligaments were clamped, cut, and suture ligated in a similar fashion on each side. The uterine arteries were clamped, cut, and suture ligated. The cornua were clamped, cut, free-tied and suture ligated. The uterus and cervix were handed off the field.  Inspection of the pedicles revealed excellent hemostasis.  The left fallopian tube and ovary was grapsed with a Babcock clamp. It was then clamped with a Heaney clamp over the IP ligament, cut, and free-tied then suture ligated. This was repeated on the right side. For the uterosacral ligament suspension (USLS), the bowel was packed away with a moistened Kerlex. The posterior cuff edge was grasped with an Allis clamp.  The right and left uterosacral ligaments were identified visually and digitally. Two stitches of 0 PDS was placed through each uterosacral ligament towards its insertion site at the sacrum. These were tagged with hemostats. The packing was removed.  A 70-degree cystoscope was introduced, and 360-degree inspection revealed no trauma in the bladder, with bilateral ureteral efflux with tension on the uterosacral  sutures.  The bladder was drained and the cystoscope was removed.  The  Foley catheter was reinserted.    It was determined that no anterior repair was required. The uterosacral stitches were then attached to the posterior and anterior edges of the vaginal cuff on the ipsilateral sides, in a through and through fashion, using a free needle. Figure of eight sutures of 0-Vicryl were placed through the vaginal cuff and tied down. The medial uterosacral stitches were then tied down with good apical support noted. The Foley catheter was removed. A 70-degree cystoscope was introduced, and 360-degree inspection revealed no trauma in the bladder, with bilateral ureteral efflux.  The bladder was drained and the cystoscope was removed. The lateral uterosacral sutures were then tied down. Cystoscopy was repeated and good efflux was noted from the ureters. The cystoscope was removed and the Foley catheter was reinserted.  The uterosacral sutures were trimmed. The vagina was copiously irrigated.  Slight oozing was noted from the vaginal cuff. An 0-vicryl interrupted suture was placed in this area. Hemostasis was noted. A rectal examination was normal and confirmed no sutures within the rectum.  The patient tolerated the procedure well.  She was awakened from anesthesia and transferred to the recovery room in stable condition. All counts were correct x 2.    Dr Sabra Heck was required for assistance of the surgery due to the complexity of the procedure and need for a skilled assistant.   Jaquita Folds, MD

## 2020-04-23 NOTE — Discharge Instructions (Signed)
Catheterizing Instructions:   If you have no urge to urinate, make sure you are catheterizing at least 4 times per day: when waking up, at lunch, at dinner and before bed.   If you feel the urge to urinate, attempt to urinate on your own. Measure how much you urinate. Then self-catheterize and measure how much additional volume.   Record amount that you urinate and that you catheterize. The nurse will call you to go over these numbers and can instruct you when to stop catheterizing.    POST OPERATIVE INSTRUCTIONS  General Instructions . Recovery (not bed rest) will last approximately 6 weeks . Walking is encouraged, but refrain from strenuous exercise/ housework/ heavy lifting. o No lifting >10lbs  . Nothing in the vagina- NO intercourse, tampons or douching . Bathing:  Do not submerge in water (NO swimming, bath, hot tub, etc) until after your postop visit. You can shower starting the day after surgery.  . No driving until you are not taking narcotic pain medicine and until your pain is well enough controlled that you can slam on the breaks or make sudden movements if needed.   Taking your medications 1. Please take your acetaminophen and ibuprofen on a schedule for the first 48 hours. Take 600mg  ibuprofen, then take 500mg  acetaminophen 3 hours later, then continue to alternate ibuprofen and acetaminophen. That way you are taking each type of medication every 6 hours. 2. Take the prescribed narcotic (oxycodone, tramadol, etc) as needed, with a maximum being every 4 hours.  3. Take a stool softener daily to keep your stools soft and preventing you from straining. If you have diarrhea, you decrease your stool softener. This is explained more below. We have prescribed you Miralax.  Reasons to Call the Nurse (see last page for phone numbers) . Heavy Bleeding (changing your pad every 1-2 hours), light bleeding is normal . Persistent nausea/vomiting . Fever (100.4 degrees or more) . Incision  problems (pus or other fluid coming out, redness, warmth, increased pain)  Things to Expect After Surgery . Mild to Moderate pain is normal during the first day or two after surgery. If prescribed, take Ibuprofen or Tylenol first and use the stronger medicine for "break-through" pain. You can overlap these medicines because they work differently.   . Constipation   . To Prevent Constipation:  Eat a well-balanced diet including protein, grains, fresh fruit and vegetables.  Drink plenty of fluids. Walk regularly.  Depending on specific instructions from your physician: take Miralax daily and additionally you can add a stool softener (colace/ docusate) and fiber supplement. Continue as long as you're on pain medications.   . To Treat Constipation:  If you do not have a bowel movement in 2 days after surgery, you can take 2 Tbs of Milk of Magnesia 1-2 times a day until you have a bowel movement. If diarrhea occurs, decrease the amount or stop the laxative. If no results with Milk of Magnesia, you can drink a bottle of magnesium citrate which you can purchase over the counter.  . Fatigue:  This is a normal response to surgery and will improve with time.  Plan frequent rest periods throughout the day.  . Gas Pain:  This is very common but can also be very painful! Drink warm liquids such as herbal teas, bouillon or soup. Walking will help you pass more gas.  Mylicon or Gas-X can be taken over the counter.  . Leaking Urine:  Varying amounts of leakage may occur after  surgery.  This should improve with time. Your bladder needs at least 3 months to recover from surgery. If you leak after surgery, be sure to mention this to your doctor at your post-op visit. If you were taking medications for overactive bladder prior to surgery, be sure to restart the medications immediately after surgery.  . Incisions: If you have incisions on your abdomen, the skin glue will dissolve on its own over time. It is ok to gently  rinse with soap and water over these incisions but do not scrub.  Catheter Approximately 50% of patients are unable to urinate after surgery and need to go home with a catheter. This allows your bladder to rest so it can return to full function. If you go home with a catheter, the office will call to set up a voiding trial a few days after surgery. For most patients, by this visit, they are able to urinate on their own. Long term catheter use is rare.   Return to Work  As work demands and recovery times vary widely, it is hard to predict when you will want to return to work. If you have a desk job with no strenuous physical activity, and if you would like to return sooner than generally recommended, discuss this with your provider or call our office.   Post op concerns  For non-emergent issues, please call the Urogynecology Nurse. Please leave a message and someone will contact you within one business day.  You can also send a message through [facility].   AFTER HOURS (After 5:00 PM and on weekends):  For urgent matters that cannot wait until the next business day. Call our office [phone] and connect to the doctor on call.  Please reserve this for important issues.   **FOR ANY TRUE EMERGENCY ISSUES CALL 911 OR GO TO THE NEAREST EMERGENCY ROOM.** Please inform our office or the doctor on call of any emergency.     APPOINTMENTS: Call [phone]

## 2020-04-23 NOTE — Progress Notes (Signed)
Unable to void, I&O cath performed with 425 cc dark yellow urine obtained. Very scant amount of vaginal drainage noted and denies pain or discomfort at present time.

## 2020-04-23 NOTE — Telephone Encounter (Signed)
Tammy Eaton underwent a total vaginal hysterectomy, bilateral salpingo-oophorectomy, uterosacral ligament suspension, and cystoscopy on 04/23/20.   She failed her voiding trial.  367ml was backfilled into the bladder. She was unable to void after 1 hour.  PVR by straight cath was 41ml.   She was discharged with self-catheterizing instructions and has supplies at home. She should be recorded voided volumes and catheterized volumes.  Please call her for a routine post op check and to review PVR from self- cath.  Thanks!  Jaquita Folds, MD

## 2020-04-24 ENCOUNTER — Encounter (HOSPITAL_BASED_OUTPATIENT_CLINIC_OR_DEPARTMENT_OTHER): Payer: Self-pay | Admitting: Obstetrics & Gynecology

## 2020-04-24 LAB — SURGICAL PATHOLOGY

## 2020-04-24 NOTE — Telephone Encounter (Signed)
Post- Op Call  Tammy Eaton underwent total vaginal hysterectomy, bilateral salpingo-oophorectomy, uterosacral ligament suspension, and cystoscopy on 04/23/20 with Dr Wannetta Sender. The patient reports that her pain is controlled. She is taking tylenol and will not take oxycodone 5mg . She reports vaginal minimal bleeding. She has not had a bowel movement and is not taking anything for a bowel movement. She does not want to add anymore medication at this time for constipation. She was discharged without a catheter.  Tammy Eaton is a 70 y.o. female complains of vomiting for 2 hours when she got home due to the anesthesia . Pt said she feels dehydrated and is going to start drinking Pedialyte today. Pt voided today with a little bit of blood by herself no cathing. Pt did not measure. Said she will measure when she is more hydrated. Pt said she has a rough night.Elita Quick, [place]

## 2020-04-24 NOTE — Anesthesia Postprocedure Evaluation (Signed)
Anesthesia Post Note  Patient: Tammy Eaton  Procedure(s) Performed: HYSTERECTOMY VAGINAL with bilateral salpingooopherectomy; (N/A ) VAGINAL VAULT SUSPENSION (N/A ) CYSTOSCOPY (N/A )     Patient location during evaluation: PACU Anesthesia Type: General Level of consciousness: awake and alert Pain management: pain level controlled Vital Signs Assessment: post-procedure vital signs reviewed and stable Respiratory status: spontaneous breathing, nonlabored ventilation and respiratory function stable Cardiovascular status: blood pressure returned to baseline and stable Postop Assessment: no apparent nausea or vomiting Anesthetic complications: no   No complications documented.  Last Vitals:  Vitals:   04/23/20 1300 04/23/20 1606  BP: (!) 109/55 (!) 126/49  Pulse: 81 83  Resp: 16 16  Temp: 36.7 C 36.7 C  SpO2: 95% 94%    Last Pain:  Vitals:   04/23/20 1606  TempSrc: Oral  PainSc: 0-No pain                 Merlinda Frederick

## 2020-04-25 ENCOUNTER — Encounter: Payer: Self-pay | Admitting: Obstetrics and Gynecology

## 2020-05-02 ENCOUNTER — Encounter (HOSPITAL_BASED_OUTPATIENT_CLINIC_OR_DEPARTMENT_OTHER): Payer: Self-pay | Admitting: Obstetrics & Gynecology

## 2020-05-02 ENCOUNTER — Other Ambulatory Visit: Payer: Self-pay

## 2020-05-02 ENCOUNTER — Ambulatory Visit (INDEPENDENT_AMBULATORY_CARE_PROVIDER_SITE_OTHER): Payer: Medicare Other | Admitting: Obstetrics & Gynecology

## 2020-05-02 VITALS — BP 123/80 | HR 98 | Ht 65.5 in | Wt 133.0 lb

## 2020-05-02 DIAGNOSIS — Z8659 Personal history of other mental and behavioral disorders: Secondary | ICD-10-CM | POA: Diagnosis not present

## 2020-05-02 DIAGNOSIS — F419 Anxiety disorder, unspecified: Secondary | ICD-10-CM | POA: Diagnosis not present

## 2020-05-02 DIAGNOSIS — N812 Incomplete uterovaginal prolapse: Secondary | ICD-10-CM

## 2020-05-02 MED ORDER — AMITRIPTYLINE HCL 25 MG PO TABS: 25.0000 mg | ORAL_TABLET | Freq: Every day | ORAL | 0 refills | Status: DC

## 2020-05-02 NOTE — Progress Notes (Signed)
GYNECOLOGY  VISIT  CC:   Discuss hormonal changes after surgery  HPI: 70 y.o. G31P2002 Married White or Caucasian female here for what feels like hormonal changes that started after her recent surgery.  She underwent a TVH with cuff suspension on 04/23/2020 with Dr. Wannetta Sender.  I assisted with the surgery and answered questions regarding her surgery.  She did use and I&O cath for about 6 days.  Has been able to stop doing the catheterization and feels like she is fully emptying her bladder.    Pt has been on Prozac and Cymbalta in the past due to anxiety and mood changes in the past.  This was years ago.  She was able to wean off the prozac.  Cymbalta really helped chronic back pain but she ended up having a lot of constipation.  She hasn't been on this for several years.  Has been on elavil now for back pain and for mood changes.    Since surgery, she feels like some of the symptoms that occurred with mood changes have occurred.  She feels like her temper is shorter and that she is having trouble controlling mood.  States she has "no filter" and on the verge of tears that comes and goes through out the day.   She also shared her daughter in law was diagnosed with metastatic breast cancer now with multiple brain rumors.    PHQ 9 = 9 GAD 7 = 16  GYNECOLOGIC HISTORY: Patient's last menstrual period was 09/26/2013 (exact date).  Patient Active Problem List   Diagnosis Date Noted  . Incomplete uterine prolapse 04/23/2020  . Uterovaginal prolapse, incomplete 04/23/2020  . Hardening of the aorta (main artery of the heart) ([place]) 02/09/2020  . Gastro-esophageal reflux disease without esophagitis 02/09/2020  . Insomnia 02/09/2020  . Major depression in complete remission ([place]) 02/09/2020  . Moderate COPD (chronic obstructive pulmonary disease) ([place]) 12/25/2014  . Nodule of right lung 12/11/2014  . 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 Primary cancer of left upper lobe of lung ([place]) 11/27/2011   . Symptomatic cholelithiasis 09/22/2011    Past Medical History:  Diagnosis Date  . Anxiety   . Arthritis 2018  . Bulging lumbar disc 2018  . Complication of anesthesia    "low blood pressure after retinal detachment surgery"  . Constipation   . COPD (chronic obstructive pulmonary disease) ([place])   . Coronary artery disease    follows up with dr Harrington Challenger prn  . Degenerative disc disease, lumbar 2018  . Depression   . Emphysema of lung ([place])   . Family history of ovarian cancer    mother died of ovarian cancer  . GERD (gastroesophageal reflux disease)   . History of hiatal hernia    small  . History of kidney stones   . History of pneumonia    "multiple times"  . History of stomach ulcers yrs ago  . Hyperlipidemia   . Hyperlipidemia   . lung ca 05/2003  . Multiple lung nodules    followed by dr Sherlon Handing 04-10-2020  . Osteoarthritis    spine, knees, hands  . PONV (postoperative nausea and vomiting)    pt "could not eat during hospital stay"  . Reflux esophagitis   . Scoliosis 2018  . Shortness of breath    with lifting heavy things  . Uterovaginal prolapse   . Wears glasses     Past Surgical History:  Procedure Laterality Date  . BREAST EXCISIONAL BIOPSY  yrs ago  .  BREAST LUMPECTOMY WITH NEEDLE LOCALIZATION Right 04/11/2013   Procedure: BREAST LUMPECTOMY WITH NEEDLE LOCALIZATION;  Surgeon: Odis Hollingshead, MD;  Location: [facility];  Service: General;  Laterality: Right;  . BREAST SURGERY  1992   lt br bx  . CHOLECYSTECTOMY  11/11/2011   Procedure: LAPAROSCOPIC CHOLECYSTECTOMY WITH INTRAOPERATIVE CHOLANGIOGRAM;  Surgeon: Odis Hollingshead, MD;  Location: [facility];  Service: General;  Laterality: N/A;  . CYSTOSCOPY  12/10   for evaluation of hematuria  . CYSTOSCOPY N/A 04/23/2020   Procedure: CYSTOSCOPY;  Surgeon: Jaquita Folds, MD;  Location: [facility];  Service: Gynecology;  Laterality: N/A;  . CYSTOSCOPY W/ URETERAL STENT  PLACEMENT Left 11/16/2019   Procedure: CYSTOSCOPY WITH STENT REPLACEMENT;  Surgeon: Raynelle Bring, MD;  Location: [facility];  Service: Urology;  Laterality: Left;  . DILATION AND CURETTAGE OF UTERUS  yrs ago   x 2  . ESOPHAGOGASTRODUODENOSCOPY  yrs ago  . EYE SURGERY  yrs ago   catataracts-both  . KNEE SURGERY  yrs ago   rt knee torn cartilage  . LUNG LOBECTOMY Left 4/05  . RETINAL DETACHMENT SURGERY  2011   left; x2  . SINUS SURGERY WITH INSTATRAK  yrs ago  . TUBAL LIGATION  yrs ago  . URETEROSCOPY WITH HOLMIUM LASER LITHOTRIPSY Left 11/29/2019   Procedure: CYSTOSCOPY; LEFT URETEROSCOPY; HOLMIUM LASER LITHOTRIPSY; LEFT URETERAL STENT EXCHANGE;  Surgeon: Festus Aloe, MD;  Location: [facility];  Service: Urology;  Laterality: Left;  ONLY NEEDS 60 MIN  . VAGINAL HYSTERECTOMY N/A 04/23/2020   Procedure: HYSTERECTOMY VAGINAL with bilateral salpingooopherectomy;;  Surgeon: Megan Salon, MD;  Location: [facility];  Service: Gynecology;  Laterality: N/A;  Dr Sabra Heck to perform; total time requested for all procedures is 3 hours  . VAGINAL PROLAPSE REPAIR N/A 04/23/2020   Procedure: VAGINAL VAULT SUSPENSION;  Surgeon: Jaquita Folds, MD;  Location: [facility];  Service: Gynecology;  Laterality: N/A;  . VIDEO BRONCHOSCOPY WITH ENDOBRONCHIAL NAVIGATION N/A 01/15/2015   Procedure: VIDEO BRONCHOSCOPY WITH ENDOBRONCHIAL NAVIGATION;  Surgeon: [name], MD;  Location: [place];  Service: Thoracic;  Laterality: N/A;  . VOCAL CORD LATERALIZATION, ENDOSCOPIC APPROACH W/ MLB  yrs ago   nodule removed    MEDS:   Current Outpatient Medications on File Prior to Visit  Medication Sig Dispense Refill  . acetaminophen (TYLENOL) 500 MG tablet Take 1 tablet (500 mg total) by mouth every 6 (six) hours as needed (pain). 30 tablet 0  . amitriptyline (ELAVIL) 25 MG tablet Take 100 mg by mouth at bedtime.    . baclofen (LIORESAL) 10 MG tablet Take 10 mg by mouth 2 (two)  times daily as needed for muscle spasms.     . celecoxib (CELEBREX) 200 MG capsule Take 200 mg by mouth daily. Pt has discontinued    . clonazePAM (KLONOPIN) 1 MG tablet Take 1 mg by mouth at bedtime.    . docusate sodium (COLACE) 100 MG capsule Take 100 mg by mouth daily.    . fluticasone (FLONASE) 50 MCG/ACT nasal spray Place 2 sprays into both nostrils daily.    . Fluticasone-Salmeterol (WIXELA INHUB) 250-50 MCG/DOSE AEPB Inhale 1 puff into the lungs in the morning and at bedtime. 60 each 12  . LINZESS 72 MCG capsule Take 72 mcg by mouth daily before breakfast.    . Omega-3 Fatty Acids (OMEGA 3 PO) Take 1 capsule by mouth daily.    Marland Kitchen oxyCODONE (OXY IR/ROXICODONE) 5  MG immediate release tablet Take 1 tablet (5 mg total) by mouth every 4 (four) hours as needed for severe pain. (Patient not taking: Reported on 05/02/2020) 15 tablet 0  . polyvinyl alcohol (LIQUIFILM TEARS) 1.4 % ophthalmic solution Place 1 drop into both eyes as needed for dry eyes.    Marland Kitchen UNABLE TO FIND Vitamin d  2000 iu daily    . esomeprazole (NEXIUM) 20 MG capsule Take 20 mg by mouth at bedtime. (Patient not taking: Reported on 05/02/2020)     No current facility-administered medications on file prior to visit.    ALLERGIES: Adhesive [tape], Septra [sulfamethoxazole-trimethoprim], Tramadol hcl, and Amoxicillin  Family History  Problem Relation Age of Onset  . Cancer Mother 34       ovarian cancer  . Hypertension Mother   . Diabetes Mother   . Alcohol abuse Father     SH:  Married, non smoker  Review of Systems  Gastrointestinal: Negative.   Genitourinary: Positive for vaginal bleeding (minimal). Negative for difficulty urinating and vaginal pain.  Psychiatric/Behavioral: The patient is nervous/anxious.     PHYSICAL EXAMINATION:    BP 123/80 (BP Location: Right Arm, Patient Position: Sitting, Cuff Size: Normal)   Pulse 98   Ht 5' 5.5" (1.664 m)   Wt 133 lb (60.3 kg)   LMP 09/26/2013 (Exact Date)   SpO2 97%    BMI 21.80 kg/m     Physical Exam Constitutional:      Appearance: Normal appearance.  Skin:    General: Skin is warm and dry.  Neurological:     General: No focal deficit present.     Mental Status: She is alert.  Psychiatric:        Mood and Affect: Mood normal.        Behavior: Behavior normal.        Thought Content: Thought content normal.    Assessment/Plan: 1. Anxiety - pt's depression and anxiety inventory indicate anxiety is a bigger issue.  However, pt has felt this way in the past and elavil helped.  She is on 100mg  so will increase to 125mg .  She has 100mg  dosage at home.  She is going to give update in 2-3 weeks or sooner with side effects or if ineffective. - amitriptyline (ELAVIL) 25 MG tablet; Take 1 tablet (25 mg total) by mouth at bedtime.  Dispense: 30 tablet; Refill: 0 - may need referral to psychiatrist but will see if this helps for now  2. History of depression  3. Uterovaginal prolapse, incomplete - s/p TVH/BSO, vaginal vault suspension  Total time with pt: 22 min

## 2020-05-14 ENCOUNTER — Encounter (HOSPITAL_BASED_OUTPATIENT_CLINIC_OR_DEPARTMENT_OTHER): Payer: Self-pay

## 2020-05-16 ENCOUNTER — Encounter (HOSPITAL_BASED_OUTPATIENT_CLINIC_OR_DEPARTMENT_OTHER): Payer: Self-pay

## 2020-05-22 ENCOUNTER — Other Ambulatory Visit: Payer: Self-pay

## 2020-05-22 ENCOUNTER — Ambulatory Visit
Admission: EM | Admit: 2020-05-22 | Discharge: 2020-05-22 | Disposition: A | Discharge status: Home / Self Care | Payer: Medicare Other | Attending: Family Medicine | Admitting: Family Medicine

## 2020-05-22 ENCOUNTER — Ambulatory Visit (INDEPENDENT_AMBULATORY_CARE_PROVIDER_SITE_OTHER): Payer: Medicare Other

## 2020-05-22 DIAGNOSIS — M79671 Pain in right foot: Secondary | ICD-10-CM

## 2020-05-22 DIAGNOSIS — M722 Plantar fascial fibromatosis: Secondary | ICD-10-CM | POA: Diagnosis not present

## 2020-05-22 MED ORDER — PREDNISONE 20 MG PO TABS: 20.0000 mg | ORAL_TABLET | Freq: Every day | ORAL | 0 refills | Status: AC

## 2020-05-22 NOTE — ED Triage Notes (Signed)
Pt c/o pain to the bottom of rt foot x1wk. Pt requesting a xray to r/o stress fx. Pt states had surgery a month ago and has been doing a lot of walking as therapy but not wearing good shoes. Denies known injury.

## 2020-05-22 NOTE — Discharge Instructions (Addendum)
You may take tylenol 500 mg every 4-6 hours as needed for pain while taking prednisone.

## 2020-05-22 NOTE — ED Provider Notes (Signed)
[facility]    CSN: [id number] Arrival date & time: 05/22/20  1209      History   Chief Complaint Chief Complaint  Patient presents with  . Foot Pain    HPI Tammy Eaton is a 70 y.o. female.   HPI  Patient presents today with foot pain involving the proximal metatarsal region of the foot.  She reports she has been exercising walking more often and has not been wearing the brace she used to support her feet.  Pain is only localized in the right foot.  She denies any known injury.  She has been attempting to relieve pain with conservative measures without any improvement.  Endorses full sensation and no puncture wounds involving the foot  Past Medical History:  Diagnosis Date  . Anxiety   . Arthritis 2018  . Bulging lumbar disc 2018  . Complication of anesthesia    "low blood pressure after retinal detachment surgery"  . Constipation   . COPD (chronic obstructive pulmonary disease) ([place])   . Coronary artery disease    follows up with dr Harrington Challenger prn  . Degenerative disc disease, lumbar 2018  . Depression   . Emphysema of lung ([place])   . Family history of ovarian cancer    mother died of ovarian cancer  . GERD (gastroesophageal reflux disease)   . History of hiatal hernia    small  . History of kidney stones   . History of pneumonia    "multiple times"  . History of stomach ulcers yrs ago  . Hyperlipidemia   . lung ca 05/2003  . Multiple lung nodules    followed by dr Sherlon Handing 04-10-2020  . Osteoarthritis    spine, knees, hands  . PONV (postoperative nausea and vomiting)    pt "could not eat during hospital stay"  . Reflux esophagitis   . Scoliosis 2018  . Shortness of breath    with lifting heavy things  . Wears glasses     Patient Active Problem List   Diagnosis Date Noted  . Hardening of the aorta (main artery of the heart) ([place]) 02/09/2020  . Gastro-esophageal reflux disease without esophagitis 02/09/2020  . Insomnia 02/09/2020  .  Major depression in complete remission ([place]) 02/09/2020  . Moderate COPD (chronic obstructive pulmonary disease) ([place]) 12/25/2014  . Nodule of right lung 12/11/2014  . 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 Primary cancer of left upper lobe of lung ([place]) 11/27/2011  . Symptomatic cholelithiasis 09/22/2011    Past Surgical History:  Procedure Laterality Date  . BREAST EXCISIONAL BIOPSY  yrs ago  . BREAST LUMPECTOMY WITH NEEDLE LOCALIZATION Right 04/11/2013   Procedure: BREAST LUMPECTOMY WITH NEEDLE LOCALIZATION;  Surgeon: Odis Hollingshead, MD;  Location: [place];  Service: General;  Laterality: Right;  . BREAST SURGERY  1992   lt br bx  . CHOLECYSTECTOMY  11/11/2011   Procedure: LAPAROSCOPIC CHOLECYSTECTOMY WITH INTRAOPERATIVE CHOLANGIOGRAM;  Surgeon: Odis Hollingshead, MD;  Location: [facility];  Service: General;  Laterality: N/A;  . CYSTOSCOPY  12/10   for evaluation of hematuria  . CYSTOSCOPY N/A 04/23/2020   Procedure: CYSTOSCOPY;  Surgeon: Jaquita Folds, MD;  Location: [facility];  Service: Gynecology;  Laterality: N/A;  . CYSTOSCOPY W/ URETERAL STENT PLACEMENT Left 11/16/2019   Procedure: CYSTOSCOPY WITH STENT REPLACEMENT;  Surgeon: Raynelle Bring, MD;  [facility];  Service: Urology;  Laterality: Left;  . DILATION AND CURETTAGE OF UTERUS  yrs  ago   x 2  . ESOPHAGOGASTRODUODENOSCOPY  yrs ago  . EYE SURGERY  yrs ago   catataracts-both  . KNEE SURGERY  yrs ago   rt knee torn cartilage  . LUNG LOBECTOMY Left 4/05  . RETINAL DETACHMENT SURGERY  2011   left; x2  . SINUS SURGERY WITH INSTATRAK  yrs ago  . TUBAL LIGATION  yrs ago  . URETEROSCOPY WITH HOLMIUM LASER LITHOTRIPSY Left 11/29/2019   Procedure: CYSTOSCOPY; LEFT URETEROSCOPY; HOLMIUM LASER LITHOTRIPSY; LEFT URETERAL STENT EXCHANGE;  Surgeon: Festus Aloe, MD;  [facility];  Service: Urology;  Laterality: Left;  ONLY NEEDS 60 MIN  . VAGINAL HYSTERECTOMY N/A 04/23/2020    Procedure: HYSTERECTOMY VAGINAL with bilateral salpingooopherectomy;;  Surgeon: Megan Salon, MD;  [facility];  Service: Gynecology;  Laterality: N/A;  Dr Sabra Heck to perform; total time requested for all procedures is 3 hours  . VAGINAL PROLAPSE REPAIR N/A 04/23/2020   Procedure: VAGINAL VAULT SUSPENSION;  Surgeon: Jaquita Folds, MD;  Location: [facility];  Service: Gynecology;  Laterality: N/A;  . VIDEO BRONCHOSCOPY WITH ENDOBRONCHIAL NAVIGATION N/A 01/15/2015   Procedure: VIDEO BRONCHOSCOPY WITH ENDOBRONCHIAL NAVIGATION;  Surgeon: [name], MD;  Location: [place];  Service: Thoracic;  Laterality: N/A;  . VOCAL CORD LATERALIZATION, ENDOSCOPIC APPROACH W/ MLB  yrs ago   nodule removed    OB History    Gravida  2   Para  2   Term  2   Preterm  0   AB  0   Living  2     SAB  0   IAB  0   Ectopic  0   Multiple  0   Live Births  2            Home Medications    Prior to Admission medications   Medication Sig Start Date End Date Taking? Authorizing Provider  predniSONE (DELTASONE) 20 MG tablet Take 1 tablet (20 mg total) by mouth daily with breakfast for 5 days. 05/22/20 05/27/20 Yes Scot Jun, FNP  acetaminophen (TYLENOL) 500 MG tablet Take 1 tablet (500 mg total) by mouth every 6 (six) hours as needed (pain). 04/18/20   Jaquita Folds, MD  amitriptyline (ELAVIL) 25 MG tablet Take 100 mg by mouth at bedtime. 12/07/19   [provider]  amitriptyline (ELAVIL) 25 MG tablet Take 1 tablet (25 mg total) by mouth at bedtime. 05/02/20   Megan Salon, MD  baclofen (LIORESAL) 10 MG tablet Take 10 mg by mouth 2 (two) times daily as needed for muscle spasms.  06/19/18   [provider]  celecoxib (CELEBREX) 200 MG capsule Take 200 mg by mouth daily. Pt has discontinued    [provider]  clonazePAM (KLONOPIN) 1 MG tablet Take 1 mg by mouth at bedtime.    [provider]   docusate sodium (COLACE) 100 MG capsule Take 100 mg by mouth daily.    [provider]  esomeprazole (NEXIUM) 20 MG capsule Take 20 mg by mouth at bedtime. Patient not taking: No sig reported    [provider]  fluticasone (FLONASE) 50 MCG/ACT nasal spray Place 2 sprays into both nostrils daily. 10/21/19   [provider]  Fluticasone-Salmeterol (WIXELA INHUB) 250-50 MCG/DOSE AEPB Inhale 1 puff into the lungs in the morning and at bedtime. 12/02/19   Margaretha Seeds, MD  LINZESS 72 MCG capsule Take 72 mcg by mouth daily before breakfast. 10/17/19  [provider]  Omega-3 Fatty Acids (OMEGA 3 PO) Take 1 capsule by mouth daily.    [provider]  polyvinyl alcohol (LIQUIFILM TEARS) 1.4 % ophthalmic solution Place 1 drop into both eyes as needed for dry eyes.    [provider]  UNABLE TO FIND Vitamin d  2000 iu daily    [provider]    Family History Family History  Problem Relation Age of Onset  . Cancer Mother 52       ovarian cancer  . Hypertension Mother   . Diabetes Mother   . Alcohol abuse Father     Social History Social History   Tobacco Use  . Smoking status: Former Smoker    Packs/day: 2.00    Years: 45.00    Pack years: 90.00    Types: Cigarettes    Start date: 1965    Quit date: 09/21/2008    Years since quitting: 11.6  . Smokeless tobacco: Never Used  Vaping Use  . Vaping Use: Never used  Substance Use Topics  . Alcohol use: Yes    Alcohol/week: 0.0 - 1.0 standard drinks    Comment: occ  . Drug use: No     Allergies   Adhesive [tape], Septra [sulfamethoxazole-trimethoprim], Tramadol hcl, and Amoxicillin   Review of Systems Review of Systems Pertinent negatives listed in HPI   Physical Exam Triage Vital Signs ED Triage Vitals  Enc Vitals Group     BP 05/22/20 1442 106/73     Pulse Rate 05/22/20 1442 85     Resp 05/22/20 1442 18     Temp 05/22/20 1442 98.1 F (36.7 C)     Temp  Source 05/22/20 1442 Oral     SpO2 05/22/20 1442 94 %     Weight --      Height --      Head Circumference --      Peak Flow --      Pain Score 05/22/20 1443 0     Pain Loc --      Pain Edu? --      Excl. in [place]? --    No data found.  Updated Vital Signs BP 106/73 (BP Location: Left Arm)   Pulse 85   Temp 98.1 F (36.7 C) (Oral)   Resp 18   LMP 09/26/2013 (Exact Date)   SpO2 94%   Visual Acuity Right Eye Distance:   Left Eye Distance:   Bilateral Distance:    Right Eye Near:   Left Eye Near:    Bilateral Near:     Physical Exam General appearance: alert, well developed, well nourished, cooperative and in no distress Head: Normocephalic, without obvious abnormality, atraumatic Respiratory: Respirations even and unlabored, normal respiratory rate Heart: rate and rhythm normal. No gallop or murmurs noted on exam  Extremities: No gross deformities, distal metatarsal pain  3rd -4th digits ball of foot Skin: Skin color, texture, turgor normal. No rashes seen  Psych: Appropriate mood and affect.   UC Treatments / Results  Labs (all labs ordered are listed, but only abnormal results are displayed) Labs Reviewed - No data to display  EKG   Radiology DG Foot Complete Right  Result Date: 05/22/2020 CLINICAL DATA:  Plantar right foot pain for the past week. EXAM: RIGHT FOOT COMPLETE - 3+ VIEW COMPARISON:  None. FINDINGS: There is no evidence of fracture or dislocation. There is no evidence of arthropathy or other focal bone abnormality. Soft tissues are unremarkable. IMPRESSION: Negative.  Electronically Signed   By: Titus Dubin M.D.   On: 05/22/2020 15:25    Procedures Procedures (including critical care time)  Medications Ordered in UC Medications - No data to display  Initial Impression / Assessment and Plan / UC Course  I have reviewed the triage vital signs and the nursing notes.  Pertinent labs & imaging results that were available during my care of the  patient were reviewed by me and considered in my medical decision making (see chart for details).     Treated for Planter fasciitis involving the right foot, prednisone 20 mg once daily for 5 days.  For breakthrough pain Tylenol 500 mg every 4-6 hours as needed.  Follow-up with PCP as needed. Final Clinical Impressions(s) / UC Diagnoses   Final diagnoses:  Plantar fasciitis of right foot     Discharge Instructions     You may take tylenol 500 mg every 4-6 hours as needed for pain while taking prednisone.   ED Prescriptions    Medication Sig Dispense Auth. Provider   predniSONE (DELTASONE) 20 MG tablet Take 1 tablet (20 mg total) by mouth daily with breakfast for 5 days. 5 tablet Scot Jun, FNP     PDMP not reviewed this encounter.   Scot Jun, [place] 05/29/20 [phone]

## 2020-05-28 ENCOUNTER — Other Ambulatory Visit (HOSPITAL_BASED_OUTPATIENT_CLINIC_OR_DEPARTMENT_OTHER): Payer: Self-pay | Admitting: Obstetrics & Gynecology

## 2020-05-28 DIAGNOSIS — Z8659 Personal history of other mental and behavioral disorders: Secondary | ICD-10-CM

## 2020-05-28 DIAGNOSIS — F419 Anxiety disorder, unspecified: Secondary | ICD-10-CM

## 2020-05-28 NOTE — Telephone Encounter (Signed)
Dr Sabra Heck please advise on refill of Elavil.  Pt saw you 05/02/20. KW CMA

## 2020-06-04 NOTE — Progress Notes (Signed)
[facility]  Date of Visit: 06/05/2020  History of Present Illness: Tammy Eaton is a 70 y.o. female scheduled today for a post-operative visit.   Surgery: s/p total vaginal hysterectomy, bilateral salpingo-oophorectomy, uterosacral ligament suspension, and cystoscopy on 04/23/20.   She did not pass her postoperative void trial and straight cattheterized for several days.   Saw Dr Sabra Heck post-operatively due to mood changes and dose of Elevil was increased.   Today she reports prolapse has been better but having some urinary leakage with coughing and standing. Feeling like she empties her bladder a lot better.   UTI in the last 6 weeks? No  Pain? No  She has returned to her normal activity (except for postop restrictions). Has done a lot of walking in the last few days.  Vaginal bulge? No  Stress incontinence: Yes  Urgency/frequency: No  Urge incontinence: No  Voiding dysfunction: No  Bowel issues: No , on Linzess for constipation  Subjective Success: Do you usually have a bulge or something falling out that you can see or feel in the vaginal area? No  Retreatment Success: Any retreatment with surgery or pessary for any compartment? 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   Allergies:  Patient is allergic to adhesive [tape], septra [sulfamethoxazole-trimethoprim], tramadol hcl, and amoxicillin.   Physical Exam: BP 116/71   Pulse 99   Ht 5' 5.5" (1.664 m)   Wt 133 lb (60.3 kg)   LMP 09/26/2013 (Exact Date)   BMI 21.80 kg/m   Abdomen: soft, non-tender, without masses or organomegaly Pelvic Examination: Vagina: Incisions healing well. Sutures are present at incision at the vaginal cuff and there is not granulation tissue. No tenderness along the anterior or posterior vagina. No apical tenderness. No pelvic masses.   POP-Q: POP-Q  -3                                            Aa   -3                                           Ba  -6.5                                              C   1.5                                            Gh  2  Pb  7                                            tvl   -3                                            Ap  -3                                            Bp                                                 D    ---------------------------------------------------------  Assessment and Plan:  1. Post-operative state     - Pathology results were reviewed with the patient today and she verbalized understanding that the results were benign.  - The patient has access to her operative note and pathology report through [place] resume regular activity including exercise. Wait 2 weeks for swimming/ tub baths due to sutures at the vaginal cuff. Not sexually active - Discussed avoidance of heavy lifting and straining long term to reduce the risk of recurrance.  - Discussed that the bladder still may be healing after surgery and would wait longer until pursuing options for SUI. Discussed pelvic floor PT and she has done this in the past and will start doing kegel exercises in addition to her regular exercise.   All questions answered. Return 3 months to reassess symptoms.   Tammy Folds, MD

## 2020-06-05 ENCOUNTER — Encounter: Payer: Self-pay | Admitting: Obstetrics and Gynecology

## 2020-06-05 ENCOUNTER — Other Ambulatory Visit: Payer: Self-pay

## 2020-06-05 ENCOUNTER — Ambulatory Visit (INDEPENDENT_AMBULATORY_CARE_PROVIDER_SITE_OTHER): Payer: Medicare Other | Admitting: Obstetrics and Gynecology

## 2020-06-05 VITALS — BP 116/71 | HR 99 | Ht 65.5 in | Wt 133.0 lb

## 2020-06-05 DIAGNOSIS — Z9889 Other specified postprocedural states: Secondary | ICD-10-CM

## 2020-06-05 NOTE — Patient Instructions (Signed)
Restart activity gradually .   Avoid heavy lifting > 25lbs   Wait an additional 2 weeks for swimming or tub bathing.

## 2020-06-22 ENCOUNTER — Other Ambulatory Visit: Payer: Self-pay | Admitting: Thoracic Surgery (Cardiothoracic Vascular Surgery)

## 2020-06-22 DIAGNOSIS — R911 Solitary pulmonary nodule: Secondary | ICD-10-CM

## 2020-07-13 ENCOUNTER — Ambulatory Visit
Admission: RE | Admit: 2020-07-13 | Discharge: 2020-07-13 | Disposition: A | Discharge status: Home / Self Care | Payer: Medicare Other | Source: Ambulatory Visit | Attending: Thoracic Surgery (Cardiothoracic Vascular Surgery) | Admitting: Thoracic Surgery (Cardiothoracic Vascular Surgery)

## 2020-07-13 DIAGNOSIS — R911 Solitary pulmonary nodule: Secondary | ICD-10-CM

## 2020-07-17 ENCOUNTER — Ambulatory Visit: Payer: Medicare Other | Admitting: Thoracic Surgery (Cardiothoracic Vascular Surgery)

## 2020-07-17 ENCOUNTER — Encounter: Payer: Self-pay | Admitting: Thoracic Surgery (Cardiothoracic Vascular Surgery)

## 2020-07-17 ENCOUNTER — Other Ambulatory Visit: Payer: Self-pay

## 2020-07-17 VITALS — BP 112/72 | HR 96 | Resp 20 | Ht 65.5 in | Wt 133.0 lb

## 2020-07-17 DIAGNOSIS — R911 Solitary pulmonary nodule: Secondary | ICD-10-CM

## 2020-07-17 DIAGNOSIS — Z85118 Personal history of other malignant neoplasm of bronchus and lung: Secondary | ICD-10-CM | POA: Diagnosis not present

## 2020-07-17 NOTE — Progress Notes (Signed)
[address]       [place] [address]             [phone]    HPI: Mrs. Vegh returns for follow-up of multiple lung nodules  Tammy Eaton is a 70 year old woman with a history of tobacco abuse, severe COPD, stage Ia non-small cell carcinoma of the lung in 2005, hyperlipidemia, osteoarthritis, anxiety, depression, degenerative disc disease, nephrolithiasis, and UTI.  She has an 80-pack-year history of smoking prior to quitting in 2010.  In 2016 she had a new right upper lobe lung nodule.  I did navigational bronchoscopy which showed inflammation.  She been followed radiographically since then.  She said multiple other nodules, which often wax and wane.  In March I saw her and she had new lung nodules the largest measured 3.1 x 1.6 cm in the superior segment of the lower lobe.  A PET/CT showed some mild hypermetabolic activity in a soft solid peripheral basilar lower lobe nodule that had been slowly increasing in size back 2020.  The larger nodule had uptake below the mediastinal blood pool.  She now returns with a repeat scan.  She has not had any new pulmonary issues since her last visit.  She has had some life stressors as her daughter-in-law passed away and she is assumed most of the care of 82 and 19-year-old granddaughters.  Past Medical History:  Diagnosis Date   Anxiety    Arthritis 2018   Bulging lumbar disc 6759   Complication of anesthesia    "low blood pressure after retinal detachment surgery"   Constipation    COPD (chronic obstructive pulmonary disease) (HCC)    Coronary artery disease    follows up with dr Harrington Challenger prn   Degenerative disc disease, lumbar 2018   Depression    Emphysema of lung ([place])    Family history of ovarian cancer    mother died of ovarian cancer   GERD (gastroesophageal reflux disease)    History of hiatal hernia    small   History of kidney stones    History of pneumonia    "multiple times"   History of stomach ulcers yrs  ago   Hyperlipidemia    lung ca 05/2003   Multiple lung nodules    followed by dr Sherlon Handing 04-10-2020   Osteoarthritis    spine, knees, hands   PONV (postoperative nausea and vomiting)    pt "could not eat during hospital stay"   Reflux esophagitis    Scoliosis 2018   Shortness of breath    with lifting heavy things   Wears glasses      Current Outpatient Medications  Medication Sig Dispense Refill   amitriptyline (ELAVIL) 25 MG tablet Take 1 tablet (25 mg total) by mouth at bedtime. (Patient taking differently: Take 100 mg by mouth at bedtime.) 30 tablet 0   baclofen (LIORESAL) 10 MG tablet Take 10 mg by mouth 2 (two) times daily as needed for muscle spasms.      clonazePAM (KLONOPIN) 1 MG tablet Take 1 mg by mouth at bedtime.     docusate sodium (COLACE) 100 MG capsule Take 100 mg by mouth daily.     fluticasone (FLONASE) 50 MCG/ACT nasal spray Place 2 sprays into both nostrils daily.     Fluticasone-Salmeterol (WIXELA INHUB) 250-50 MCG/DOSE AEPB Inhale 1 puff into the lungs in the morning and at bedtime. 60 each 12   LINZESS 72 MCG capsule Take 72 mcg by  mouth daily before breakfast.     Omega-3 Fatty Acids (OMEGA 3 PO) Take 1 capsule by mouth daily.     polyvinyl alcohol (LIQUIFILM TEARS) 1.4 % ophthalmic solution Place 1 drop into both eyes as needed for dry eyes.     UNABLE TO FIND Vitamin d  2000 iu daily     No current facility-administered medications for this visit.    Physical Exam BP 112/72   Pulse 96   Resp 20   Ht 5' 5.5" (1.664 m)   Wt 133 lb (60.3 kg)   LMP 09/26/2013 (Exact Date)   SpO2 97% Comment: RA  BMI 21.30 kg/m  70 year old woman in no acute distress Alert and oriented x3 with no focal deficits Lungs with diminished breath sounds bilaterally, no wheezing Cardiac regular rate and rhythm  Diagnostic Tests: CT CHEST WITHOUT CONTRAST   TECHNIQUE: Multidetector CT imaging of the chest was performed following the standard protocol without IV  contrast.   COMPARISON:  03/27/2020 chest CT.   FINDINGS: Cardiovascular: Aortic atherosclerosis. Normal heart size, without pericardial effusion. Multivessel coronary artery atherosclerosis.   Mediastinum/Nodes: No mediastinal or definite hilar adenopathy, given limitations of unenhanced CT.   Lungs/Pleura: No pleural fluid.   Advanced bullous emphysema.  Left upper lobectomy.   Azygos fissure.   Right lower lobe calcified granuloma on 90/8.   Posterior right upper lobe bandlike consolidation with a contiguous area of medial nodularity is similar to on the prior chest CT. Example central nodule of 1.0 cm on 47/8 versus 1.1 cm on the prior diagnostic CT (when remeasured).   The superior and lateral left lower lobe nodularity is similar. The most confluent area measures 3.0 by 1.4 cm on 55/8 versus 2.9 x 1.6 cm on the diagnostic CT (when remeasured).   The most suspicious finding the prior PET, within the lateral left lower lobe, is less distinct today. Example a vague area of pleural based soft tissue thickening including at 1.2 x 0.9 cm on 106/8. Compare 2.2 x 0.8 cm on 04/09/2020 PET.   Upper Abdomen: Cholecystectomy. Normal imaged portions of the liver, spleen, stomach, pancreas, adrenal glands. Upper pole left renal too small to characterize lesion. Abdominal aortic atherosclerosis.   Musculoskeletal: Lower thoracic spondylosis. Convex right thoracic spine curvature.   IMPRESSION: 1. Status post left upper lobectomy, without typical findings of recurrent or metastatic disease. 2. Right upper and left lower lobe areas of nodularity and bandlike consolidation which were not significantly hypermetabolic on the PET of 38/33/3832 are similar, again favoring chronic infection. 3. The suspicious finding on the comparison PET, an area of nodularity/soft tissue thickening within the lateral left lung base is less distinct today, possibly representing improving infection  or inflammation. Consider surveillance CT at 6 months. 4. Aortic atherosclerosis (ICD10-I70.0), coronary artery atherosclerosis and emphysema (ICD10-J43.9).     Electronically Signed   By: Abigail Miyamoto M.D.   On: 07/13/2020 16:42 I personally reviewed the CT images and concur with the findings noted above.  There is no significant change in the superior segment left lower lobe nodules.   Significant improvement in the basilar left lower lobe nodule.  Impression: Tammy Eaton is a 70 year old woman with a history of tobacco abuse, severe COPD, stage Ia non-small cell carcinoma of the lung in 2005, hyperlipidemia, osteoarthritis, anxiety, depression, degenerative disc disease, nephrolithiasis, and UTI.  She has an 80-pack-year history of smoking prior to quitting in 2010.  Mrs.Kittle had a stage Ia non-small cell carcinoma resected back in  2016.  She is been followed since then.  She has had some waxing and waning nodules in the lungs.  She has had biopsy at one point which is showed inflammation.  It was nonspecific.  AFB and fungal cultures were negative.  QuantiFERON gold was negative.  She recently had some new nodules appear in 1 of those was fairly hypermetabolic.  On her scan today the one that was most metabolically active is nearly completely resolved was the other ones that were less active are relatively stable.  To be on the safe side I think we should rescan her again in about 4 months  Plan: Return in 4 months with CT chest  I spent over 20 minutes in review of records, images, and in consultation with Mrs. Dlugosz today [name], MD [facility] [phone]

## 2020-08-02 ENCOUNTER — Other Ambulatory Visit: Payer: Self-pay | Admitting: Obstetrics & Gynecology

## 2020-08-02 DIAGNOSIS — Z1231 Encounter for screening mammogram for malignant neoplasm of breast: Secondary | ICD-10-CM

## 2020-08-13 ENCOUNTER — Encounter (HOSPITAL_BASED_OUTPATIENT_CLINIC_OR_DEPARTMENT_OTHER): Payer: Self-pay

## 2020-08-14 ENCOUNTER — Other Ambulatory Visit: Payer: Self-pay

## 2020-08-14 ENCOUNTER — Ambulatory Visit: Payer: Medicare Other | Admitting: Pulmonary Disease

## 2020-08-14 VITALS — BP 108/64 | HR 90 | Temp 98.8°F | Ht 65.5 in | Wt 132.0 lb

## 2020-08-14 DIAGNOSIS — J449 Chronic obstructive pulmonary disease, unspecified: Secondary | ICD-10-CM | POA: Diagnosis not present

## 2020-08-14 NOTE — Progress Notes (Signed)
Subjective:   PATIENT ID: Tammy Eaton GENDER: female DOB: 1950/02/15, MRN: [id number]   HPI  No chief complaint on file.  Reason for Visit: Follow-up  [name] is a 70 year old female with COPD, history of lung nodule, hx of lung cancer in 2004, anxiety/depression and GERD who presents for follow-up.   Synopsis: Diagnosed with COPD 15 years ago. She was only started bronchodilators [facility]) five years ago. Denies COPD exacerbations requiring ED or hospitalizations. She rarely gets treated for bronchitis or exacerbations as an outpatient. She quit smoking 2010.   She reports her health is overall well. She is well-controlled on Wixela. Does not need rescue inhaler. Last exacerbation in September 2021 and no exacerbations since the last visit. Denies shortness of breath, wheezing or cough. Able to perform yardwork and regular activities. Participates in yoga and light weight training.  Her daughter-in-law passed away in May 19, 2020 from metastatic breast cancer. Since then she has been involved with helping her son with caring for granddaughters three days a week. She has had difficulty communicating with her grieving son. This has been a stressor as she has assumed a caregiver role and involved with the children's counseling and schooling.  Prior inhalers: Spiriva - thrush Trelegy - chest congestion Breo - insurance  Social History: Former smoker. 80 pack years. Quit in 2010.  Imaging, labs and test noted above have been reviewed independently by me.   Past Medical History:  Diagnosis Date   Anxiety    Arthritis 2018   Bulging lumbar disc 2035   Complication of anesthesia    "low blood pressure after retinal detachment surgery"   Constipation    COPD (chronic obstructive pulmonary disease) (HCC)    Coronary artery disease    follows up with dr Harrington Challenger prn   Degenerative disc disease, lumbar 2018   Depression    Emphysema of lung ([place])    Family history of  ovarian cancer    mother died of ovarian cancer   GERD (gastroesophageal reflux disease)    History of hiatal hernia    small   History of kidney stones    History of pneumonia    "multiple times"   History of stomach ulcers yrs ago   Hyperlipidemia    lung ca 05/20/03   Multiple lung nodules    followed by dr Sherlon Handing 04-10-2020   Osteoarthritis    spine, knees, hands   PONV (postoperative nausea and vomiting)    pt "could not eat during hospital stay"   Reflux esophagitis    Scoliosis 2018   Shortness of breath    with lifting heavy things   Wears glasses       Outpatient Medications Prior to Visit  Medication Sig Dispense Refill   amitriptyline (ELAVIL) 25 MG tablet Take 1 tablet (25 mg total) by mouth at bedtime. (Patient taking differently: Take 100 mg by mouth at bedtime.) 30 tablet 0   baclofen (LIORESAL) 10 MG tablet Take 10 mg by mouth 2 (two) times daily as needed for muscle spasms.      clonazePAM (KLONOPIN) 1 MG tablet Take 1 mg by mouth at bedtime.     docusate sodium (COLACE) 100 MG capsule Take 100 mg by mouth daily.     fluticasone (FLONASE) 50 MCG/ACT nasal spray Place 2 sprays into both nostrils daily.     Fluticasone-Salmeterol (WIXELA INHUB) 250-50 MCG/DOSE AEPB Inhale 1 puff into the lungs in the morning and at bedtime.  60 each 12   LINZESS 72 MCG capsule Take 72 mcg by mouth daily before breakfast.     Omega-3 Fatty Acids (OMEGA 3 PO) Take 1 capsule by mouth daily.     polyvinyl alcohol (LIQUIFILM TEARS) 1.4 % ophthalmic solution Place 1 drop into both eyes as needed for dry eyes.     UNABLE TO FIND Vitamin d  2000 iu daily     No facility-administered medications prior to visit.    Review of Systems  Constitutional:  Negative for chills, diaphoresis, fever, malaise/fatigue and weight loss.  HENT:  Negative for congestion.   Respiratory:  Negative for cough, hemoptysis, sputum production, shortness of breath and wheezing.   Cardiovascular:   Negative for chest pain, palpitations and leg swelling.    Objective:   Vitals:   08/14/20 1330  BP: 108/64  Pulse: 90  Temp: 98.8 F (37.1 C)  TempSrc: Oral  SpO2: 97%  Weight: 132 lb (59.9 kg)  Height: 5' 5.5" (1.664 m)       Physical Exam: General: Well-appearing, no acute distress HENT: [place], AT, Eyes: EOMI, no scleral icterus Respiratory: Clear to auscultation bilaterally.  No crackles, wheezing or rales Cardiovascular: RRR, -M/R/G, no JVD Extremities:-Edema,-tenderness Neuro: AAO x4, CNII-XII grossly intact Skin: Intact, no rashes or bruising Psych: Normal mood, normal affect  Data Reviewed:  Imaging: CT chest 03/26/2018-status post left upper lobe lobectomy with left mediastinal shift.  Stable right upper lobe and left upper lobe nodules.  Very severe centrilobular emphysema in the background  CT 03/28/19 - Unchanged compared to 03/26/18 exam with stable nodules.   CT 07/13/20 - Unchanged with stable nodules in RUL and LLL  PFT: 11/28/2014 FVC 3.68 (109%) FEV1 1.87 (72%) Ratio 51 TLC 121% DLCO 43% Interpretation: Mild obstructive defect with moderately reduced DLCO.  No significant bronchodilator response.  No air trapping.  04/11/19 FVC 3.06 (93%) FEV1 1.65 (66%) Ratio 53  TLC 116% DLCO 49% Interpretation: Moderate obstructive defect with air trapping and reduced DLCO consistent with emphysema. Worsened air obstruction compared to 2016.  Imaging, labs and test noted above have been reviewed independently by me.    Assessment & Plan:   Discussion: 70 year old female with COPD, history of lung nodule, hx of lung cancer in 2004, anxiety/depression who presents for follow-up. Stable. No exacerbation since 10/2019.  Moderate COPD with emphysema and bronchitis - well-controlled CONTINUE Wixela 200-3mcg ONE puff TWICE a day. Please call for refills when needed CONTINUE Flonase 1 spray per nare daily  History of stage Ia lung cancer status post left upper lobe  lobectomy in 2005 Right upper lobe nodule status post nondiagnostic navigational bronchoscopy in 2016 Recent nodules have been stable x 2 years. Likely benign Following CT surgery with Dr. Roxan Hockey. CT Chest planned for 11/2020  Health Maintenance Immunization History  Administered Date(s) Administered   Fluad Quad(high Dose 65+) 10/26/2019   Influenza Split 10/22/2007, 11/20/2011, 11/08/2012, 11/04/2014   Influenza, High Dose Seasonal PF 10/25/2016, 10/19/2017, 09/23/2018   Influenza,inj,Quad PF,6+ Mos 10/30/2015   PFIZER(Purple Top)SARS-COV-2 Vaccination 03/11/2019, 03/31/2019   Pneumococcal Conjugate-13 02/22/2015   Pneumococcal Polysaccharide-23 02/10/2005, 02/06/2011, 12/24/2016   Td 08/31/2001   Tdap 02/04/2011   Zoster Recombinat (Shingrix) 01/17/2018, 04/12/2018   Zoster, Live 02/04/2011   CT Lung Screen - not qualified. Hx lung cancer  No orders of the defined types were placed in this encounter.  No orders of the defined types were placed in this encounter.  Return in about 6 months (around  02/14/2021).   I have spent a total time of 32-minutes on the day of the appointment reviewing prior documentation, coordinating care and discussing medical diagnosis and plan with the patient/family. Imaging, labs and tests included in this note have been reviewed and interpreted independently by me.  [place], MD [facility] 08/14/2020 12:14 PM  Office Number [phone]

## 2020-08-14 NOTE — Patient Instructions (Addendum)
Moderate COPD with emphysema and bronchitis - well-controlled CONTINUE Wixela 200-27mcg ONE puff TWICE a day. Please call for refills when needed CONTINUE Flonase 1 spray per nare daily

## 2020-09-04 NOTE — Progress Notes (Signed)
[facility] Return Visit  SUBJECTIVE  History of Present Illness: Tammy Eaton is a 70 y.o. female seen in follow-up for incontinence. s/p total vaginal hysterectomy, bilateral salpingo-oophorectomy, uterosacral ligament suspension, and cystoscopy on 04/23/20.   She has not had any leakage since her last appointment. Empties her bladder well. No vaginal bulge. Denies vaginal bleeding or pain.   Has some constipation but has worked on her diet (fiber one cereal). Not exercising currently- has plantar fasciitis.   Of note, she recently lost her daughter in law to breast cancer.   Past Medical History: Patient  has a past medical history of Anxiety, Arthritis (2018), Bulging lumbar disc (1856), Complication of anesthesia, Constipation, COPD (chronic obstructive pulmonary disease) ([place]), Coronary artery disease, Degenerative disc disease, lumbar (2018), Depression, Emphysema of lung ([place]), Family history of ovarian cancer, GERD (gastroesophageal reflux disease), History of hiatal hernia, History of kidney stones, History of pneumonia, History of stomach ulcers (yrs ago), Hyperlipidemia, lung ca (05/2003), Multiple lung nodules, Osteoarthritis, PONV (postoperative nausea and vomiting), Reflux esophagitis, Scoliosis (2018), Shortness of breath, and Wears glasses.   Past Surgical History: She  has a past surgical history that includes Lung lobectomy (Left, 4/05); Sinus surgery with [employer] (yrs ago); Vocal cord lateralization, endoscopic approach w/ MLB (yrs ago); Knee surgery (yrs ago); Cystoscopy (12/10); Retinal detachment surgery (2011); Cholecystectomy (11/11/2011); Breast lumpectomy with needle localization (Right, 04/11/2013); Esophagogastroduodenoscopy (yrs ago); Video bronchoscopy with endobronchial navigation (N/A, 01/15/2015); Breast excisional biopsy (yrs ago); Cystoscopy w/ ureteral stent placement (Left, 11/16/2019); Ureteroscopy with holmium laser lithotripsy (Left,  11/29/2019); Breast surgery (1992); Eye surgery (yrs ago); Dilation and curettage of uterus (yrs ago); Tubal ligation (yrs ago); Vaginal hysterectomy (N/A, 04/23/2020); Vaginal prolapse repair (N/A, 04/23/2020); and Cystoscopy (N/A, 04/23/2020).   Medications: She has a current medication list which includes the following prescription(s): amitriptyline, baclofen, clonazepam, docusate sodium, fluticasone, fluticasone-salmeterol, linzess, polyvinyl alcohol, and UNABLE TO FIND.   Allergies: Patient is allergic to adhesive [tape], septra [sulfamethoxazole-trimethoprim], tramadol hcl, and amoxicillin.   Social History: Patient  reports that she quit smoking about 11 years ago. Her smoking use included cigarettes. She started smoking about 57 years ago. She has a 90.00 pack-year smoking history. She has never used smokeless tobacco. She reports current alcohol use. She reports that she does not use drugs.      OBJECTIVE     Physical Exam: Vitals:   09/05/20 1140  BP: 100/66  Pulse: 98  Weight: 132 lb (59.9 kg)   Gen: No apparent distress, A&O x 3.  Detailed Urogynecologic Evaluation:  Deferred.    ASSESSMENT AND PLAN    Tammy Eaton is a 70 y.o. with:  1. SUI (stress urinary incontinence, female)   - Symptoms of SUI have now resolved since surgery. No further intervention needed at this time.  - No new symptoms of prolapse  She will return as needed.   Tammy Folds, MD  Time spent: I spent 15 minutes dedicated to the care of this patient on the date of this encounter to include pre-visit review of records, face-to-face time with the patient and post visit documentation.

## 2020-09-05 ENCOUNTER — Encounter: Payer: Self-pay | Admitting: Obstetrics and Gynecology

## 2020-09-05 ENCOUNTER — Ambulatory Visit (INDEPENDENT_AMBULATORY_CARE_PROVIDER_SITE_OTHER): Payer: Medicare Other | Admitting: Obstetrics and Gynecology

## 2020-09-05 ENCOUNTER — Other Ambulatory Visit: Payer: Self-pay

## 2020-09-05 VITALS — BP 100/66 | HR 98 | Wt 132.0 lb

## 2020-09-05 DIAGNOSIS — N393 Stress incontinence (female) (male): Secondary | ICD-10-CM | POA: Diagnosis not present

## 2020-09-26 ENCOUNTER — Other Ambulatory Visit: Payer: Self-pay

## 2020-09-26 ENCOUNTER — Ambulatory Visit
Admission: RE | Admit: 2020-09-26 | Discharge: 2020-09-26 | Disposition: A | Discharge status: Home / Self Care | Payer: Medicare Other | Source: Ambulatory Visit | Attending: Obstetrics & Gynecology | Admitting: Obstetrics & Gynecology

## 2020-09-26 DIAGNOSIS — Z1231 Encounter for screening mammogram for malignant neoplasm of breast: Secondary | ICD-10-CM

## 2020-10-17 ENCOUNTER — Other Ambulatory Visit: Payer: Self-pay | Admitting: *Deleted

## 2020-10-17 DIAGNOSIS — R918 Other nonspecific abnormal finding of lung field: Secondary | ICD-10-CM

## 2020-10-17 NOTE — Progress Notes (Unsigned)
Ct

## 2020-11-20 ENCOUNTER — Telehealth (HOSPITAL_BASED_OUTPATIENT_CLINIC_OR_DEPARTMENT_OTHER): Payer: Self-pay | Admitting: Obstetrics & Gynecology

## 2020-11-20 ENCOUNTER — Ambulatory Visit: Payer: Medicare Other | Admitting: Thoracic Surgery (Cardiothoracic Vascular Surgery)

## 2020-11-20 ENCOUNTER — Ambulatory Visit (HOSPITAL_BASED_OUTPATIENT_CLINIC_OR_DEPARTMENT_OTHER): Payer: Medicare Other | Admitting: Obstetrics & Gynecology

## 2020-11-22 ENCOUNTER — Other Ambulatory Visit: Payer: Medicare Other

## 2020-11-22 ENCOUNTER — Ambulatory Visit: Payer: Medicare Other

## 2020-11-23 ENCOUNTER — Ambulatory Visit
Admission: RE | Admit: 2020-11-23 | Discharge: 2020-11-23 | Disposition: A | Discharge status: Home / Self Care | Payer: Medicare Other | Source: Ambulatory Visit | Attending: Thoracic Surgery (Cardiothoracic Vascular Surgery) | Admitting: Thoracic Surgery (Cardiothoracic Vascular Surgery)

## 2020-11-23 DIAGNOSIS — R918 Other nonspecific abnormal finding of lung field: Secondary | ICD-10-CM

## 2020-11-27 ENCOUNTER — Other Ambulatory Visit: Payer: Self-pay

## 2020-11-27 ENCOUNTER — Ambulatory Visit: Payer: Medicare Other | Admitting: Thoracic Surgery (Cardiothoracic Vascular Surgery)

## 2020-11-27 VITALS — BP 100/57 | HR 88 | Resp 20 | Ht 65.5 in | Wt 143.0 lb

## 2020-11-27 DIAGNOSIS — Z85118 Personal history of other malignant neoplasm of bronchus and lung: Secondary | ICD-10-CM

## 2020-11-27 DIAGNOSIS — R911 Solitary pulmonary nodule: Secondary | ICD-10-CM

## 2020-11-27 NOTE — Progress Notes (Signed)
[address]       [place] [address]             [phone]     HPI: [name] returns for scheduled follow-up visit  Tammy Eaton is a 70 year old woman with a history of tobacco abuse, severe COPD, stage Ia non-small cell carcinoma (2005), lung nodules, hyperlipidemia, osteoarthritis, anxiety, depression, degenerative disc disease and arthritis.  She had an 80-pack-year history of smoking prior to quitting in 2010.  In 2016 she developed a new right upper lobe lung nodule.  Navigational bronchoscopy was performed.  Biopsy showed only inflammation.  She has been followed radiographically.  She said multiple other nodules which wax and wane over time.  In February CT showed new nodules in the left lower lobe.  She had a previous upper lobectomy.  On PET/CT there was some activity at the base but the larger nodules more superior and lateral were not particularly active.  On a follow-up scan the nodule that showed metabolic activity and gotten smaller and the others were stable.  In the interim since her last visit her primary complaint is back pain.  She had to stop Celebrex due to gastrointestinal complications and now is having significant issues with arthritis pain.  She quit taking Wixela.  She says that that made her have worse problems with coughing and wheezing and shortness of breath.  That has improved since she stopped that medication.  Past Medical History:  Diagnosis Date   Anxiety    Arthritis 2018   Bulging lumbar disc 3151   Complication of anesthesia    "low blood pressure after retinal detachment surgery"   Constipation    COPD (chronic obstructive pulmonary disease) (HCC)    Coronary artery disease    follows up with dr Harrington Challenger prn   Degenerative disc disease, lumbar 2018   Depression    Emphysema of lung ([place])    Family history of ovarian cancer    mother died of ovarian cancer   GERD (gastroesophageal reflux disease)    History of hiatal  hernia    small   History of kidney stones    History of pneumonia    "multiple times"   History of stomach ulcers yrs ago   Hyperlipidemia    lung ca 05/2003   Multiple lung nodules    followed by dr Sherlon Handing 04-10-2020   Osteoarthritis    spine, knees, hands   PONV (postoperative nausea and vomiting)    pt "could not eat during hospital stay"   Reflux esophagitis    Scoliosis 2018   Shortness of breath    with lifting heavy things   Wears glasses     Current Outpatient Medications  Medication Sig Dispense Refill   amitriptyline (ELAVIL) 100 MG tablet Take 100 mg by mouth at bedtime.     baclofen (LIORESAL) 10 MG tablet Take 10 mg by mouth 2 (two) times daily as needed for muscle spasms.      clonazePAM (KLONOPIN) 1 MG tablet Take 1 mg by mouth at bedtime.     docusate sodium (COLACE) 100 MG capsule Take 100 mg by mouth daily.     fluticasone (FLONASE) 50 MCG/ACT nasal spray Place 2 sprays into both nostrils daily.     Fluticasone-Salmeterol (WIXELA INHUB) 250-50 MCG/DOSE AEPB Inhale 1 puff into the lungs in the morning and at bedtime. 60 each 12   LINZESS 72 MCG capsule Take 72 mcg by mouth  daily before breakfast.     polyvinyl alcohol (LIQUIFILM TEARS) 1.4 % ophthalmic solution Place 1 drop into both eyes as needed for dry eyes.     UNABLE TO FIND Vitamin d  2000 iu daily     No current facility-administered medications for this visit.    Physical Exam BP (!) 100/57   Pulse 88   Resp 20   Ht 5' 5.5" (1.664 m)   Wt 143 lb (64.9 kg)   LMP 09/26/2013 (Exact Date)   SpO2 92% Comment: RA  BMI 23.45 kg/m  70 year old woman in no acute distress Alert and oriented x3 with no focal deficits Lungs with diminished breath sounds bilaterally Cardiac regular rate and rhythm  Diagnostic Tests: CT CHEST WITHOUT CONTRAST   TECHNIQUE: Multidetector CT imaging of the chest was performed following the standard protocol without IV contrast.   COMPARISON:  CT chest  07/13/2020 and 03/27/2020.  PET-CT 04/09/2020.   FINDINGS: Cardiovascular: Atherosclerosis of the aorta, great vessels and coronary arteries. No acute vascular findings are evident on noncontrast imaging. The heart size is normal. There is no pericardial effusion.   Mediastinum/Nodes: There are no enlarged mediastinal, hilar or axillary lymph nodes.Small mediastinal lymph nodes are unchanged. Hilar assessment is limited by the lack of intravenous contrast, although the hilar contours appear unchanged. The thyroid gland, trachea and esophagus demonstrate no significant findings.   Lungs/Pleura: There is no pleural effusion or pneumothorax. Severe centrilobular and paraseptal emphysema again noted. Patient is status post left upper lobectomy. There is stable scarring posteriorly in the right upper lobe and right upper lobe nodularity measuring up to 11 mm on image 48/3. Dominant nodule peripherally in the left lower lobe appears similar, measuring 3.2 x 1.4 cm on image 55/3. The subpleural nodule more inferiorly also appears stable, measuring 1.5 x 0.7 cm on image 104/3 (most obvious on the sagittal images). No new or enlarging pulmonary nodules identified.   Upper abdomen: The visualized upper abdomen appears stable without significant findings. Previous cholecystectomy. Vascular calcifications are noted.   Musculoskeletal/Chest wall: There is no chest wall mass or suspicious osseous finding. Stable degenerative changes in the spine.   IMPRESSION: 1. Status post left upper lobectomy without evidence of local recurrence or definite metastatic disease. 2. Bilateral pulmonary nodularity appears unchanged from 03/27/2020. Suggest continued CT follow-in 6-12 months. 3. No thoracic adenopathy, pleural effusion or acute findings. 4. Coronary and Aortic Atherosclerosis (ICD10-I70.0) and Emphysema (ICD10-J43.9).     Electronically Signed   By: Richardean Sale M.D.   On: 11/23/2020  14:29 I personally reviewed the CT images.  The lateral left lower lobe nodule is unchanged.  The inferior subpleural nodule is unchanged from her most recent scan but is definitely smaller than it was back in February.  Impression: Tammy Eaton is a 70 year old woman with a history of tobacco abuse, severe COPD, stage Ia non-small cell carcinoma (2005), lung nodules, hyperlipidemia, osteoarthritis, anxiety, depression, degenerative disc disease and arthritis.  She had an 80-pack-year history of smoking prior to quitting in 2010.   Lung nodules-no change in solid-appearing nodules in lateral aspect of left lower lobe.  Those have been stable for at least 6 months now.  Minimal activity on PET below mediastinal background.  We will continue to follow radiographically.  Inferior nodule has decreased in size from February, no change from June.  COPD-she stopped taking Wixela.  She says her breathing is improved since then.  She will discuss that with pulmonology when she sees them  in the near future.  Plan:  Return in 6 months with CT chest  I spent over 20 minutes in review of records, images, and in consultation with Tammy Eaton today. [name], MD [facility] [phone]

## 2020-12-14 NOTE — Telephone Encounter (Signed)
Patient called for nurse Maudie Mercury and message was given to nurse Maudie Mercury.

## 2021-02-08 ENCOUNTER — Ambulatory Visit: Payer: Medicare Other | Admitting: Pulmonary Disease

## 2021-02-08 ENCOUNTER — Other Ambulatory Visit: Payer: Self-pay

## 2021-02-08 ENCOUNTER — Encounter: Payer: Self-pay | Admitting: Pulmonary Disease

## 2021-02-08 VITALS — BP 126/70 | HR 86 | Temp 98.2°F | Ht 65.25 in | Wt 142.8 lb

## 2021-02-08 DIAGNOSIS — J449 Chronic obstructive pulmonary disease, unspecified: Secondary | ICD-10-CM

## 2021-02-08 MED ORDER — BREO ELLIPTA 200-25 MCG/ACT IN AEPB: 1.0000 | INHALATION_SPRAY | Freq: Every day | RESPIRATORY_TRACT | 5 refills | Status: DC

## 2021-02-08 NOTE — Patient Instructions (Signed)
°  Moderate COPD with emphysema and bronchitis - uncontrolled START Breo 200-25 mcg ONE puff ONCE a day. Please call for refills when needed CONTINUE Flonase 1 spray per nare daily  Follow-up with me in 6 months

## 2021-02-08 NOTE — Progress Notes (Signed)
Subjective:   PATIENT ID: Tammy Eaton GENDER: female DOB: [birth year], MRN: [id number]   HPI  Chief Complaint  Patient presents with   Follow-up    COPD   Reason for Visit: Follow-up  Tammy Eaton is a 71 year old female with COPD, history of lung nodule, hx of lung cancer in 2004, anxiety/depression and GERD who presents for follow-up.   Synopsis: Diagnosed with COPD 15 years ago. She was only started bronchodilators [facility]) five years ago. Denies COPD exacerbations requiring ED or hospitalizations. She rarely gets treated for bronchitis or exacerbations as an outpatient. She quit smoking 2010.   02/08/21 Seen by CTS 11/27/20. Note reviewed. Stable lung nodules with minimal PET activity. Stopped taking Wixela two months ago due to abrupt worsening cough. Previously no issues. Since being off she has noticed shortness of breath with exertion and brisk walks. Winter worsens her symptoms. No wheezing or cough. No exacerbations in >12 months.  Able to perform yardwork and regular activities. Participates in yoga and light weight training.  Prior inhalers: Spiriva - thrush Trelegy - chest congestion Breo - insurance Wixela - cough  Social History: Former smoker. 80 pack years. Quit in 2010. Previously had a wood stove in house Her daughter-in-law passed away in 06/11/20 from metastatic breast cancer. Since then she has been involved with helping her son with caring for granddaughters three days a week. She has had difficulty communicating with her grieving son. This has been a stressor as she has assumed a caregiver role and involved with the children's counseling and schooling.   Past Medical History:  Diagnosis Date   Anxiety    Arthritis 2018   Bulging lumbar disc 6629   Complication of anesthesia    "low blood pressure after retinal detachment surgery"   Constipation    COPD (chronic obstructive pulmonary disease) (HCC)    Coronary artery disease    follows up  with dr Harrington Challenger prn   Degenerative disc disease, lumbar 2018   Depression    Emphysema of lung ([place])    Family history of ovarian cancer    mother died of ovarian cancer   GERD (gastroesophageal reflux disease)    History of hiatal hernia    small   History of kidney stones    History of pneumonia    "multiple times"   History of stomach ulcers yrs ago   Hyperlipidemia    lung ca 06/12/2003   Multiple lung nodules    followed by dr Sherlon Handing 04-10-2020   Osteoarthritis    spine, knees, hands   PONV (postoperative nausea and vomiting)    pt "could not eat during hospital stay"   Reflux esophagitis    Scoliosis 2018   Shortness of breath    with lifting heavy things   Wears glasses       Outpatient Medications Prior to Visit  Medication Sig Dispense Refill   amitriptyline (ELAVIL) 100 MG tablet Take 100 mg by mouth at bedtime.     baclofen (LIORESAL) 10 MG tablet Take 10 mg by mouth 2 (two) times daily as needed for muscle spasms.      clonazePAM (KLONOPIN) 1 MG tablet Take 1 mg by mouth at bedtime.     docusate sodium (COLACE) 100 MG capsule Take 100 mg by mouth daily.     fluticasone (FLONASE) 50 MCG/ACT nasal spray Place 2 sprays into both nostrils daily.     LINZESS 72 MCG capsule Take 72 mcg  by mouth daily before breakfast.     polyvinyl alcohol (LIQUIFILM TEARS) 1.4 % ophthalmic solution Place 1 drop into both eyes as needed for dry eyes.     UNABLE TO FIND Vitamin d  2000 iu daily     No facility-administered medications prior to visit.    Review of Systems  Constitutional:  Negative for chills, diaphoresis, fever, malaise/fatigue and weight loss.  HENT:  Negative for congestion.   Respiratory:  Positive for shortness of breath. Negative for cough, hemoptysis, sputum production and wheezing.   Cardiovascular:  Negative for chest pain, palpitations and leg swelling.    Objective:   Vitals:   02/08/21 0958  BP: 126/70  Pulse: 86  Temp: 98.2 F (36.8 C)   TempSrc: Oral  SpO2: 95%  Weight: 142 lb 12.8 oz (64.8 kg)  Height: 5' 5.25" (1.657 m)     Physical Exam: General: Well-appearing, no acute distress HENT: [place], AT, Eyes: EOMI, no scleral icterus Respiratory: Clear to auscultation bilaterally.  No crackles, wheezing or rales Cardiovascular: RRR, -M/R/G, no JVD Extremities:-Edema,-tenderness Neuro: AAO x4, CNII-XII grossly intact Skin: Intact, no rashes or bruising Psych: Normal mood, normal affect  Data Reviewed:  Imaging: CT chest 03/26/2018-status post left upper lobe lobectomy with left mediastinal shift.  Stable right upper lobe and left upper lobe nodules.  Very severe centrilobular emphysema in the background  CT 03/28/19 - Unchanged compared to 03/26/18 exam with stable nodules.   CT 07/13/20 - Unchanged with stable nodules in RUL and LLL  CT Chest 11/23/20 - S/p LUL lobectomy with left mediastinal shift. Unchanged pulmonary nodules >1 cm. Severe centrilobular and paraseptal emphysema in the background.  PFT: 11/28/2014 FVC 3.68 (109%) FEV1 1.87 (72%) Ratio 51 TLC 121% DLCO 43% Interpretation: Mild obstructive defect with moderately reduced DLCO.  No significant bronchodilator response.  No air trapping.  04/11/19 FVC 3.06 (93%) FEV1 1.65 (66%) Ratio 53  TLC 116% DLCO 49% Interpretation: Moderate obstructive defect with air trapping and reduced DLCO consistent with emphysema. Worsened air obstruction compared to 2016.  Assessment & Plan:   Discussion: 71 year old female with COPD, history of lung nodule, hx of lung cancer in 2004, anxiety/depression who presents for follow-up. Stable. No exacerbation since 31/5668.  71 year old female with COPD, hx of multiple lung nodules, hx lung cancer in 2004 s/p LUL lobectomy who presents for follow-up. Previously controlled on ICS/LABA. After self-discontinuing due to cough, dyspnea has returned. We discussed bronchodilator management for her emphysema.  Moderate COPD with emphysema  and bronchitis - uncontrolled, not in exacerbation START Breo 200-25 mcg ONE puff ONCE a day. Please call for refills when needed CONTINUE Flonase 1 spray per nare daily  History of stage Ia lung cancer status post left upper lobe lobectomy in 2005 Right upper lobe nodule status post nondiagnostic navigational bronchoscopy in 2016 Recent nodules have been stable x 2 years. Likely benign Following CT surgery with Dr. Roxan Hockey.   Health Maintenance Immunization History  Administered Date(s) Administered   Fluad Quad(high Dose 65+) 10/26/2019   Influenza Split 10/22/2007, 11/20/2011, 11/08/2012, 11/04/2014   Influenza, High Dose Seasonal PF 10/25/2016, 10/19/2017, 09/23/2018   Influenza,inj,Quad PF,6+ Mos 10/30/2015   PFIZER(Purple Top)SARS-COV-2 Vaccination 03/11/2019, 03/31/2019, 10/31/2019, 05/04/2020   Pneumococcal Conjugate-13 02/22/2015   Pneumococcal Polysaccharide-23 02/10/2005, 02/06/2011, 12/24/2016   Td 08/31/2001   Tdap 02/04/2011   Zoster Recombinat (Shingrix) 01/17/2018, 04/12/2018   Zoster, Live 02/04/2011   CT Lung Screen - not qualified. Hx lung cancer  No  orders of the defined types were placed in this encounter.  Meds ordered this encounter  Medications   fluticasone furoate-vilanterol (BREO ELLIPTA) 200-25 MCG/ACT AEPB    Sig: Inhale 1 puff into the lungs daily.    Dispense:  60 each    Refill:  5   Return in about 6 months (around 08/08/2021).   I have spent a total time of 33-minutes on the day of the appointment reviewing prior documentation, coordinating care and discussing medical diagnosis and plan with the patient/family. Past medical history, allergies, medications were reviewed. Pertinent imaging, labs and tests included in this note have been reviewed and interpreted independently by me.  [place], MD [facility] 02/08/2021 8:58 AM  Office Number [phone]

## 2021-02-11 ENCOUNTER — Other Ambulatory Visit: Payer: Self-pay | Admitting: Physician Assistant

## 2021-02-11 DIAGNOSIS — R600 Localized edema: Secondary | ICD-10-CM

## 2021-02-14 ENCOUNTER — Encounter: Payer: Self-pay | Admitting: Pulmonary Disease

## 2021-02-20 ENCOUNTER — Other Ambulatory Visit: Payer: Self-pay

## 2021-02-20 ENCOUNTER — Ambulatory Visit
Admission: RE | Admit: 2021-02-20 | Discharge: 2021-02-20 | Disposition: A | Discharge status: Home / Self Care | Payer: Medicare Other | Source: Ambulatory Visit | Attending: Physician Assistant | Admitting: Physician Assistant

## 2021-02-20 DIAGNOSIS — R600 Localized edema: Secondary | ICD-10-CM

## 2021-03-18 ENCOUNTER — Other Ambulatory Visit: Payer: Self-pay | Admitting: Orthopaedic Surgery

## 2021-03-18 DIAGNOSIS — M5416 Radiculopathy, lumbar region: Secondary | ICD-10-CM

## 2021-04-02 ENCOUNTER — Ambulatory Visit
Admission: RE | Admit: 2021-04-02 | Discharge: 2021-04-02 | Disposition: A | Discharge status: Home / Self Care | Payer: Medicare Other | Source: Ambulatory Visit | Attending: Orthopaedic Surgery | Admitting: Orthopaedic Surgery

## 2021-04-02 ENCOUNTER — Other Ambulatory Visit: Payer: Self-pay

## 2021-04-02 DIAGNOSIS — M5416 Radiculopathy, lumbar region: Secondary | ICD-10-CM

## 2021-04-08 ENCOUNTER — Other Ambulatory Visit: Payer: Self-pay | Admitting: Thoracic Surgery (Cardiothoracic Vascular Surgery)

## 2021-04-08 DIAGNOSIS — R911 Solitary pulmonary nodule: Secondary | ICD-10-CM

## 2021-05-30 ENCOUNTER — Ambulatory Visit
Admission: RE | Admit: 2021-05-30 | Discharge: 2021-05-30 | Disposition: A | Discharge status: Home / Self Care | Payer: Medicare Other | Source: Ambulatory Visit | Attending: Thoracic Surgery (Cardiothoracic Vascular Surgery) | Admitting: Thoracic Surgery (Cardiothoracic Vascular Surgery)

## 2021-05-30 DIAGNOSIS — R911 Solitary pulmonary nodule: Secondary | ICD-10-CM

## 2021-06-04 ENCOUNTER — Ambulatory Visit: Payer: Medicare Other | Admitting: Surgical

## 2021-06-04 VITALS — BP 108/60 | HR 88 | Resp 20 | Ht 62.0 in | Wt 130.0 lb

## 2021-06-04 DIAGNOSIS — R911 Solitary pulmonary nodule: Secondary | ICD-10-CM | POA: Diagnosis not present

## 2021-06-04 NOTE — Patient Instructions (Signed)
Pulmonary hygiene and lifestyle management for emphysema including medications as prescribed by pulmonologist. ?

## 2021-06-04 NOTE — Progress Notes (Signed)
? ?   ?[address] ?      [place] [address] ?            [phone]   ? ?  ? ?HPI: [name] is a 71 year old female with an 80-pack-year smoking history prior to including in 2010, severe COPD, stage Ia non-small cell carcinoma diagnosed in 2005 dyslipidemia, osteoarthritis, anxiety, depression, and degenerative disc disease.  She is status post right upper lobectomy.  She is continuing to have pulmonary nodules that have waxed over the years but have been followed radiographically.  On CT scan 6 months ago.  The nodules in the lateral left lower lobe showed no change and had been stable for the previous 6 months.  She does have a new nodule as described in the report below.  It is recommended that she undergo a 62-month repeat study to follow this particular nodule.  Overall she feels as though she is doing okay from a pulmonary perspective.  Her primary issue currently is difficulty with back pain that she is seeing a spine surgeon to try to determine next steps although she is definitely trying to avoid surgery if possible. ? ? ?Current Outpatient Medications  ?Medication Sig Dispense Refill  ? amitriptyline (ELAVIL) 100 MG tablet Take 100 mg by mouth at bedtime.    ? clonazePAM (KLONOPIN) 1 MG tablet Take 1 mg by mouth at bedtime.    ? docusate sodium (COLACE) 100 MG capsule Take 100 mg by mouth daily.    ? fluticasone (FLONASE) 50 MCG/ACT nasal spray Place 2 sprays into both nostrils daily.    ? fluticasone furoate-vilanterol (BREO ELLIPTA) 200-25 MCG/ACT AEPB Inhale 1 puff into the lungs daily. 60 each 5  ? LINZESS 72 MCG capsule Take 72 mcg by mouth daily before breakfast.    ? polyvinyl alcohol (LIQUIFILM TEARS) 1.4 % ophthalmic solution Place 1 drop into both eyes as needed for dry eyes.    ? UNABLE TO FIND Vitamin d  2000 iu daily    ? baclofen (LIORESAL) 10 MG tablet Take 10 mg by mouth 2 (two) times daily as needed for muscle spasms.  (Patient not taking: Reported on 06/04/2021)     ? ?No current facility-administered medications for this visit.  ? ? ?Physical Exam: ?General: Alert, no acute distress ?Pulmonary exam: Clear lungs throughout ?Cardiac exam regular rate and rhythm without murmur, gallop or rub ?Abdominal examination: Slightly protuberant, soft, nontender ?Extremities: No edema ? ? ?Diagnostic Tests: ?Study Result ? ?Narrative & Impression  ?CLINICAL DATA:  Pulmonary nodule, follow-up, history of LEFT upper ?lobe lung cancer 18 years ago with surgery, COPD, coronary artery ?disease, kidney stones, former smoker ?  ?EXAM: ?CT CHEST WITHOUT CONTRAST ?  ?TECHNIQUE: ?Multidetector CT imaging of the chest was performed following the ?standard protocol without IV contrast. ?  ?RADIATION DOSE REDUCTION: This exam was performed according to the ?departmental dose-optimization program which includes automated ?exposure control, adjustment of the mA and/or kV according to ?patient size and/or use of iterative reconstruction technique. ?  ?COMPARISON:  11/23/2020 ?  ?FINDINGS: ?Cardiovascular: Atherosclerotic calcifications aorta, proximal great ?vessels, and coronary arteries. Aorta normal caliber. Heart size ?normal. No pericardial effusion. ?  ?Mediastinum/Nodes: Calcified mediastinal and LEFT hilar lymph nodes. ?No thoracic adenopathy. Esophagus unremarkable. Base of cervical ?region normal appearance. ?  ?Lungs/Pleura: Severe emphysematous changes with upper lobe volume ?loss and superior retraction of hila. No pleural effusion or ?pneumothorax. Calcified granulomata bilaterally ventricular nodular ?focus LEFT upper lobe 9  mm greatest diameter image 43 unchanged. New ?area of peripheral nodularity versus atelectasis LEFT upper lobe 9 ?mm diameter. Subpleural opacity lateral LEFT lower lobe 12 mm ?diameter image 98 unchanged. Chronic atelectasis and volume loss ?RIGHT upper lobe. Azygous fissure. Masslike area of opacity in LEFT ?upper lobe 3.2 x 1.5 cm unchanged. Sub solid opacity LEFT  lower lobe ?14 x 5 mm, new, question infiltrate versus sub solid nodule. No ?additional infiltrate. ?  ?Upper Abdomen: Post cholecystectomy. Remaining visualized upper ?abdomen unremarkable. ?  ?Musculoskeletal: Osseous demineralization. Old healed mid sternal ?fracture. No acute bone lesions. ?  ?IMPRESSION: ?Severe emphysematous changes with BILATERAL upper lobe volume loss ?and scarring. ?  ?Several large nodular areas of masslike opacity in LEFT upper lobe ?are unchanged. ?  ?New sub solid nodule LEFT lower lobe 14 x 5 mm question infiltrate ?versus nodule; short-term follow-up CT imaging recommended in 3 ?months to establish stability. ?  ?New focus of probable subpleural atelectasis LEFT upper lobe. ?  ?Scattered atherosclerotic changes including coronary arteries. ?  ?Aortic Atherosclerosis (ICD10-I70.0) and Emphysema (ICD10-J43.9). ?  ?  ?Electronically Signed ?  By: Lavonia Dana M.D. ?  On: 05/30/2021 10:21 ?   ? ? ?Impression: ?New subsolid nodule in the left lower lobe 14 x 5 mm, otherwise scan appears pretty stable.  We will repeat CT in 3 months and she will follow-up with Dr. Roxan Hockey. ? ? ?John Giovanni, PA-C  ?[facility] ?[phone] ? ?

## 2021-06-28 ENCOUNTER — Encounter (HOSPITAL_BASED_OUTPATIENT_CLINIC_OR_DEPARTMENT_OTHER): Payer: Self-pay | Admitting: Obstetrics & Gynecology

## 2021-06-28 ENCOUNTER — Ambulatory Visit (INDEPENDENT_AMBULATORY_CARE_PROVIDER_SITE_OTHER): Payer: Medicare Other | Admitting: Obstetrics & Gynecology

## 2021-06-28 VITALS — BP 107/50 | HR 82 | Ht 65.5 in | Wt 127.6 lb

## 2021-06-28 DIAGNOSIS — Z01419 Encounter for gynecological examination (general) (routine) without abnormal findings: Secondary | ICD-10-CM

## 2021-06-28 DIAGNOSIS — Z780 Asymptomatic menopausal state: Secondary | ICD-10-CM

## 2021-06-28 DIAGNOSIS — Z90710 Acquired absence of both cervix and uterus: Secondary | ICD-10-CM

## 2021-06-28 NOTE — Progress Notes (Signed)
71 y.o. G2P2002 Married White or Caucasian female here for breast and pelvic exam.  I am also following her for h/o cystocele, uterovaginal prolapse and hysterectomy done 04/2020.  Doing well from this standpoint.  Denies any urinary issues.  Denies vaginal bleeding.  Pleased with how she did post op.  Has lost weight.  She did this intentionally.  Would like to be at 120#.  Patient's last menstrual period was 09/26/2013 (exact date).          Sexually active: No.  H/O STD:  no  Health Maintenance: PCP:  Dr. Lindell Noe.  Last wellness appt was 02/2021.  Did blood work at that appt:  yes Vaccines are up to date:  yes Colonoscopy:  2022, Dr Michail Sermon at [facility].  No additional follow up recommended MMG:  09/2020 BMD:  2018 Last pap smear:  prior to hysterectomy.   H/o abnormal pap smear:  no    reports that she quit smoking about 12 years ago. Her smoking use included cigarettes. She started smoking about 58 years ago. She has a 90.00 pack-year smoking history. She has never used smokeless tobacco. She reports current alcohol use. She reports that she does not use drugs.  Past Medical History:  Diagnosis Date   Anxiety    Arthritis 2018   Bulging lumbar disc 7681   Complication of anesthesia    "low blood pressure after retinal detachment surgery"   Constipation    COPD (chronic obstructive pulmonary disease) (HCC)    Coronary artery disease    follows up with dr Harrington Challenger prn   Degenerative disc disease, lumbar 2018   Depression    Emphysema of lung ([place])    Family history of ovarian cancer    mother died of ovarian cancer   GERD (gastroesophageal reflux disease)    History of hiatal hernia    small   History of kidney stones    History of pneumonia    "multiple times"   History of stomach ulcers yrs ago   Hyperlipidemia    lung ca 05/2003   Multiple lung nodules    followed by dr Sherlon Handing 04-10-2020   Osteoarthritis    spine, knees, hands   PONV (postoperative nausea and  vomiting)    pt "could not eat during hospital stay"   Reflux esophagitis    Scoliosis 2018   Shortness of breath    with lifting heavy things   Wears glasses     Past Surgical History:  Procedure Laterality Date   BREAST EXCISIONAL BIOPSY  yrs ago   BREAST LUMPECTOMY WITH NEEDLE LOCALIZATION Right 04/11/2013   Procedure: BREAST LUMPECTOMY WITH NEEDLE LOCALIZATION;  Surgeon: Odis Hollingshead, MD;  Location: [facility];  Service: General;  Laterality: Right;   BREAST SURGERY  1992   lt br bx   CHOLECYSTECTOMY  11/11/2011   Procedure: LAPAROSCOPIC CHOLECYSTECTOMY WITH INTRAOPERATIVE CHOLANGIOGRAM;  Surgeon: Odis Hollingshead, MD;  Location: [facility];  Service: General;  Laterality: N/A;   CYSTOSCOPY  12/10   for evaluation of hematuria   CYSTOSCOPY N/A 04/23/2020   Procedure: CYSTOSCOPY;  Surgeon: Jaquita Folds, MD;  Location[facility];  Service: Gynecology;  Laterality: N/A;   CYSTOSCOPY W/ URETERAL STENT PLACEMENT Left 11/16/2019   Procedure: CYSTOSCOPY WITH STENT REPLACEMENT;  Surgeon: Raynelle Bring, MD;  Location: [facility];  Service: Urology;  Laterality: Left;   DILATION AND CURETTAGE OF UTERUS  yrs ago   x 2  ESOPHAGOGASTRODUODENOSCOPY  yrs ago   EYE SURGERY  yrs ago   catataracts-both   KNEE SURGERY  yrs ago   rt knee torn cartilage   LUNG LOBECTOMY Left 4/05   RETINAL DETACHMENT SURGERY  2011   left; x2   SINUS SURGERY WITH INSTATRAK  yrs ago   TUBAL LIGATION  yrs ago   URETEROSCOPY WITH HOLMIUM LASER LITHOTRIPSY Left 11/29/2019   Procedure: CYSTOSCOPY; LEFT URETEROSCOPY; HOLMIUM LASER LITHOTRIPSY; LEFT URETERAL STENT EXCHANGE;  Surgeon: Festus Aloe, MD;  Location: [facility];  Service: Urology;  Laterality: Left;  ONLY NEEDS 60 MIN   VAGINAL HYSTERECTOMY N/A 04/23/2020   Procedure: HYSTERECTOMY VAGINAL with bilateral salpingooopherectomy;;  Surgeon: Megan Salon, MD;  Location: [facility];  Service: Gynecology;   Laterality: N/A;  Dr Sabra Heck to perform; total time requested for all procedures is 3 hours   VAGINAL PROLAPSE REPAIR N/A 04/23/2020   Procedure: VAGINAL VAULT SUSPENSION;  Surgeon: Jaquita Folds, MD;  Location: [facility];  Service: Gynecology;  Laterality: N/A;   VIDEO BRONCHOSCOPY WITH ENDOBRONCHIAL NAVIGATION N/A 01/15/2015   Procedure: VIDEO BRONCHOSCOPY WITH ENDOBRONCHIAL NAVIGATION;  Surgeon: [name], MD;  Location: [place];  Service: Thoracic;  Laterality: N/A;   VOCAL CORD LATERALIZATION, ENDOSCOPIC APPROACH W/ MLB  yrs ago   nodule removed    Current Outpatient Medications  Medication Sig Dispense Refill   amitriptyline (ELAVIL) 100 MG tablet Take 100 mg by mouth at bedtime.     clonazePAM (KLONOPIN) 1 MG tablet Take 1 mg by mouth at bedtime.     docusate sodium (COLACE) 100 MG capsule Take 100 mg by mouth daily.     fluticasone (FLONASE) 50 MCG/ACT nasal spray Place 2 sprays into both nostrils daily.     fluticasone furoate-vilanterol (BREO ELLIPTA) 200-25 MCG/ACT AEPB Inhale 1 puff into the lungs daily. 60 each 5   LINZESS 72 MCG capsule Take 72 mcg by mouth daily before breakfast.     polyvinyl alcohol (LIQUIFILM TEARS) 1.4 % ophthalmic solution Place 1 drop into both eyes as needed for dry eyes.     tiZANidine (ZANAFLEX) 4 MG capsule Take 4 mg by mouth 3 (three) times daily as needed for muscle spasms.     UNABLE TO FIND Vitamin d  2000 iu daily     baclofen (LIORESAL) 10 MG tablet Take 10 mg by mouth 2 (two) times daily as needed for muscle spasms.  (Patient not taking: Reported on 06/04/2021)     No current facility-administered medications for this visit.    Family History  Problem Relation Age of Onset   Cancer Mother 67       ovarian cancer   Hypertension Mother    Diabetes Mother    Alcohol abuse Father     Review of Systems  Exam:   BP (!) 107/50   Pulse 82   Ht 5' 5.5" (1.664 m)   Wt 127 lb 9.6 oz (57.9 kg)   LMP 09/26/2013  (Exact Date)   BMI 20.91 kg/m   Height: 5' 5.5" (166.4 cm)  General appearance: alert, cooperative and appears stated age Breasts: normal appearance, no masses or tenderness Abdomen: soft, non-tender; bowel sounds normal; no masses,  no organomegaly Lymph nodes: Cervical, supraclavicular, and axillary nodes normal.  No abnormal inguinal nodes palpated Neurologic: Grossly normal  Pelvic: External genitalia:  no lesions              Urethra:  normal appearing urethra with  no masses, tenderness or lesions              Bartholins and Skenes: normal                 Vagina: normal appearing vagina with atrophic changes and no discharge, no lesions              Cervix: absent              Pap taken: No. Bimanual Exam:  Uterus:  uterus absent              Adnexa: no mass, fullness, tenderness               Rectovaginal: Confirms               Anus:  normal sphincter tone, no lesions  Chaperone, [place], CMA, was present for exam.  Assessment/Plan: 1. Encntr for gyn exam (general) (routine) w/o abn findings - pap not indicated - mmg up to date - colonoscopy done 2022 - BMD order placed - vaccines reviewed/update  2. Postmenopausal - DG BONE DENSITY (DXA); Future  3. H/O vaginal hysterectomy

## 2021-08-05 ENCOUNTER — Other Ambulatory Visit: Payer: Self-pay | Admitting: *Deleted

## 2021-08-05 DIAGNOSIS — Z85118 Personal history of other malignant neoplasm of bronchus and lung: Secondary | ICD-10-CM

## 2021-08-11 ENCOUNTER — Other Ambulatory Visit: Payer: Self-pay | Admitting: Pulmonary Disease

## 2021-09-09 ENCOUNTER — Other Ambulatory Visit: Payer: Self-pay | Admitting: Physical Medicine and Rehabilitation

## 2021-09-09 DIAGNOSIS — Z1231 Encounter for screening mammogram for malignant neoplasm of breast: Secondary | ICD-10-CM

## 2021-09-12 ENCOUNTER — Ambulatory Visit
Admission: RE | Admit: 2021-09-12 | Discharge: 2021-09-12 | Disposition: A | Discharge status: Home / Self Care | Payer: Medicare Other | Source: Ambulatory Visit | Attending: Thoracic Surgery (Cardiothoracic Vascular Surgery) | Admitting: Thoracic Surgery (Cardiothoracic Vascular Surgery)

## 2021-09-12 DIAGNOSIS — Z85118 Personal history of other malignant neoplasm of bronchus and lung: Secondary | ICD-10-CM

## 2021-09-17 ENCOUNTER — Ambulatory Visit: Payer: Medicare Other | Admitting: Thoracic Surgery (Cardiothoracic Vascular Surgery)

## 2021-09-17 ENCOUNTER — Encounter: Payer: Self-pay | Admitting: Thoracic Surgery (Cardiothoracic Vascular Surgery)

## 2021-09-17 VITALS — BP 98/65 | HR 86 | Resp 20 | Ht 65.5 in | Wt 123.1 lb

## 2021-09-17 DIAGNOSIS — R911 Solitary pulmonary nodule: Secondary | ICD-10-CM

## 2021-09-17 NOTE — Progress Notes (Signed)
HPI: Tammy Eaton returns for a scheduled follow-up visit regarding lung nodules.  Tammy Eaton is a 71 year old woman with history of tobacco abuse, COPD, stage Ia non-small cell carcinoma, lung nodules, hyperlipidemia, osteoarthritis, anxiety, depression, degenerative disc disease, and arthritis.  She had an 80-pack-year history of smoking before quitting in 2010.  She had a left upper lobectomy for non-small cell carcinoma in 2005.  She has been followed for lung nodules dating back at least October 2016.  These have waxed and waned over time.  Some have remained stable over time.  She developed a new left lower lobe nodule in early 2022.  It was not particularly metabolic on PET.  It has remained stable.  She was last seen in May.  There was a new mixed density nodule in the left lower lobe distinct from her previous left lower lobe findings.  She now returns for 24-month follow-up.  She feels well at rest and endorse but does have breathing issues with the heat and humidity currently.  Her primary problem currently is back pain.  She has severe degenerative disc disease in the lumbar spine.  Past Medical History:  Diagnosis Date   Anxiety    Arthritis 2018   Bulging lumbar disc 6301   Complication of anesthesia    "low blood pressure after retinal detachment surgery"   Constipation    COPD (chronic obstructive pulmonary disease) (HCC)    Coronary artery disease    follows up with dr Harrington Challenger prn   Degenerative disc disease, lumbar 2018   Depression    Emphysema of lung ([place])    Family history of ovarian cancer    mother died of ovarian cancer   GERD (gastroesophageal reflux disease)    History of hiatal hernia    small   History of kidney stones    History of pneumonia    "multiple times"   History of stomach ulcers yrs ago   Hyperlipidemia    lung ca 05/2003   Multiple lung nodules    followed by dr Sherlon Handing 04-10-2020   Osteoarthritis    spine, knees, hands   PONV  (postoperative nausea and vomiting)    pt "could not eat during hospital stay"   Reflux esophagitis    Scoliosis 2018   Shortness of breath    with lifting heavy things   Wears glasses     Current Outpatient Medications  Medication Sig Dispense Refill   amitriptyline (ELAVIL) 100 MG tablet Take 100 mg by mouth at bedtime.     BREO ELLIPTA 200-25 MCG/ACT AEPB TAKE 1 PUFF BY MOUTH EVERY DAY 60 each 5   clonazePAM (KLONOPIN) 1 MG tablet Take 1 mg by mouth at bedtime.     docusate sodium (COLACE) 100 MG capsule Take 100 mg by mouth daily.     fluticasone (FLONASE) 50 MCG/ACT nasal spray Place 2 sprays into both nostrils daily.     LINZESS 72 MCG capsule Take 72 mcg by mouth daily before breakfast.     polyvinyl alcohol (LIQUIFILM TEARS) 1.4 % ophthalmic solution Place 1 drop into both eyes as needed for dry eyes.     tiZANidine (ZANAFLEX) 4 MG capsule Take 4 mg by mouth 3 (three) times daily as needed for muscle spasms.     UNABLE TO FIND Vitamin d  2000 iu daily     No current facility-administered medications for this visit.    Physical Exam BP 98/65 (BP Location: Left Arm, Patient Position: Sitting, Cuff  Size: Normal)   Pulse 86   Resp 20   Ht 5' 5.5" (1.664 m)   Wt 123 lb 1.9 oz (55.8 kg)   LMP 09/26/2013 (Exact Date)   SpO2 92% Comment: RA  BMI 20.58 kg/m  71 year old woman in no acute distress Alert and oriented x3 with no focal deficit Cardiac regular rate and rhythm Lungs diminished bilaterally left greater than right, no wheezing at present  Diagnostic Tests: CT CHEST WITHOUT CONTRAST   TECHNIQUE: Multidetector CT imaging of the chest was performed following the standard protocol without IV contrast.   RADIATION DOSE REDUCTION: This exam was performed according to the departmental dose-optimization program which includes automated exposure control, adjustment of the mA and/or kV according to patient size and/or use of iterative reconstruction technique.    COMPARISON:  CT 05/30/2021 and 11/23/2020.   FINDINGS: Cardiovascular: Atherosclerosis of the aorta, great vessels and coronary arteries. No acute vascular findings on noncontrast CT. The heart size is normal. There is no pericardial effusion.   Mediastinum/Nodes: There are no enlarged mediastinal, hilar or axillary lymph nodes.Small calcified mediastinal lymph nodes. Hilar assessment is limited by the lack of intravenous contrast, although the hilar contours appear unchanged. The thyroid gland, trachea and esophagus demonstrate no significant findings.   Lungs/Pleura: No pleural effusion or pneumothorax. Chronic volume loss in the left hemithorax from previous left upper lobectomy. Stable severe emphysematous changes in both lungs with scattered nodular scarring. Right upper lobe nodular component measuring 1.1 x 0.9 cm on image 39/8 is unchanged. Chronic nodular left lower lobe lesion measuring 3.1 x 1.1 cm on image 47/8 is unchanged. The new left lower lobe ground-glass nodule seen on the most recent study has resolved. There are no new or enlarging nodules currently.   Upper abdomen: The visualized upper abdomen appears stable without significant findings.   Musculoskeletal/Chest wall: There is no chest wall mass or suspicious osseous finding. Stable postsurgical changes in the right breast. Thoracolumbar scoliosis with associated spondylosis.   IMPRESSION: 1. Interval resolution of ground-glass left lower lobe nodule seen on most recent study of 5 months ago, consistent with resolved inflammation. 2. Otherwise stable chronic lung disease with chronic nodularity in both lungs, consistent with benign etiologies. No new or enlarging pulmonary nodules. No evidence of metastatic disease status post left upper lobectomy. 3. Coronary and aortic atherosclerosis (ICD10-I70.0). Emphysema (ICD10-J43.9).     Electronically Signed   By: Richardean Sale M.D.   On: 09/12/2021  11:50 I personally reviewed the CT images.  Severe emphysema.  Coronary and aortic atherosclerosis.  Resolution of the new left lower lobe nodule in May.  Stable chronic nodularity.  Impression: Tammy Eaton is a 71 year old woman with history of tobacco abuse, COPD, stage Ia non-small cell carcinoma, lung nodules, hyperlipidemia, osteoarthritis, anxiety, depression, degenerative disc disease, and arthritis.    Stage Ia non-small cell carcinoma of the lung, status post left upper lobectomy in 2005.  Pulmonary nodules-3 x 1 cm nodule in the left lower lobe stable for 18 months.  Had minimal activity on PET.  We will plan to do another CT in 6 months for 2-year follow-up.  Chronic changes on right stable.  New groundglass opacity seen in May has resolved.  COPD-difficulty with heat and humidity.  Managed by Dr. Loanne Drilling.  Plan: Return in 6 months with CT chest.    [name], MD [facility] [phone]

## 2021-09-27 ENCOUNTER — Ambulatory Visit
Admission: RE | Admit: 2021-09-27 | Discharge: 2021-09-27 | Disposition: A | Discharge status: Home / Self Care | Payer: Medicare Other | Source: Ambulatory Visit | Attending: Physical Medicine and Rehabilitation | Admitting: Physical Medicine and Rehabilitation

## 2021-09-27 DIAGNOSIS — Z1231 Encounter for screening mammogram for malignant neoplasm of breast: Secondary | ICD-10-CM

## 2022-01-20 ENCOUNTER — Ambulatory Visit
Admission: RE | Admit: 2022-01-20 | Discharge: 2022-01-20 | Disposition: A | Discharge status: Home / Self Care | Payer: Medicare Other | Source: Ambulatory Visit | Attending: Obstetrics & Gynecology | Admitting: Obstetrics & Gynecology

## 2022-01-20 DIAGNOSIS — Z780 Asymptomatic menopausal state: Secondary | ICD-10-CM

## 2022-02-12 ENCOUNTER — Other Ambulatory Visit: Payer: Self-pay | Admitting: Thoracic Surgery (Cardiothoracic Vascular Surgery)

## 2022-02-12 DIAGNOSIS — R911 Solitary pulmonary nodule: Secondary | ICD-10-CM

## 2022-02-13 IMAGING — CT CT CHEST W/O CM
1 series · 15 of 34 positions shown, 19 images · non-contrast
Comparison: CT chest 07/13/2020 and 03/27/2020.  PET-CT 04/09/2020.

CLINICAL DATA: Follow up pulmonary nodule. History of left lung
cancer with lower lobectomy.

EXAM:
CT CHEST WITHOUT CONTRAST
TECHNIQUE: Multidetector CT imaging of the chest was performed following the
standard protocol without IV contrast.

[Series 2: chest w/(date) · axial · 0.67mm/px · z∈[-298,-28]mm · 15 of 159 slices shown, 19 images]
[im 12/159  mediastinal]
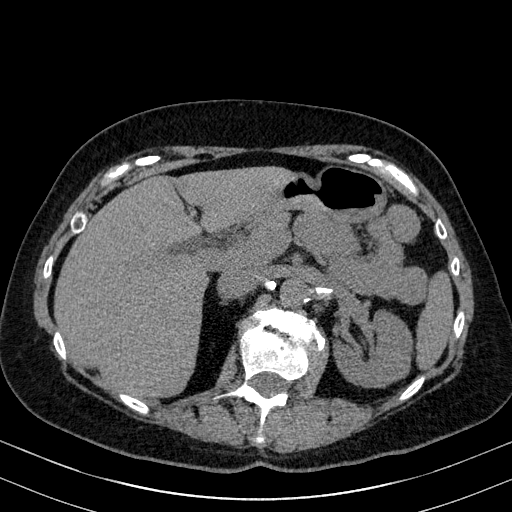
[im 12/159  lung]
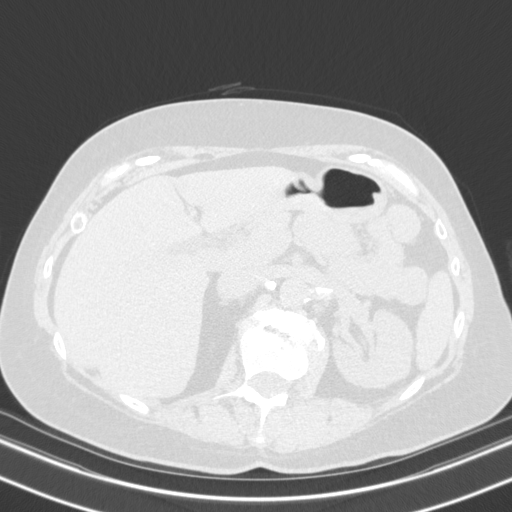
[im 24/159  lung]
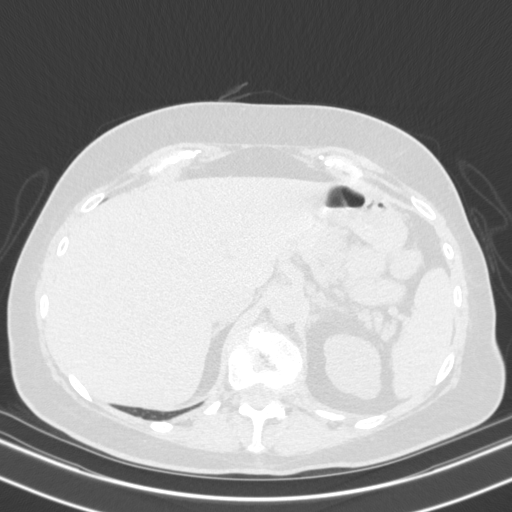
[im 32/159  lung]
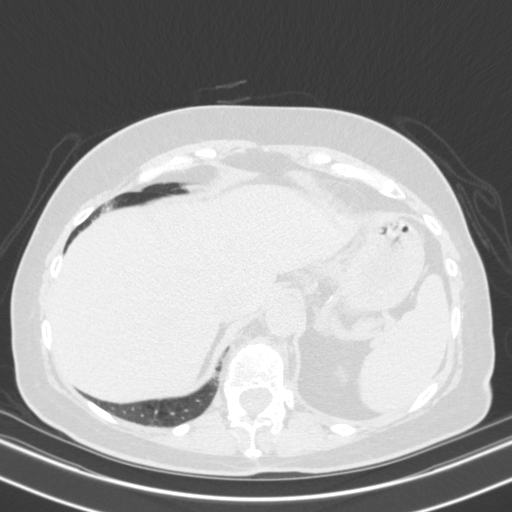
[im 41/159  lung]
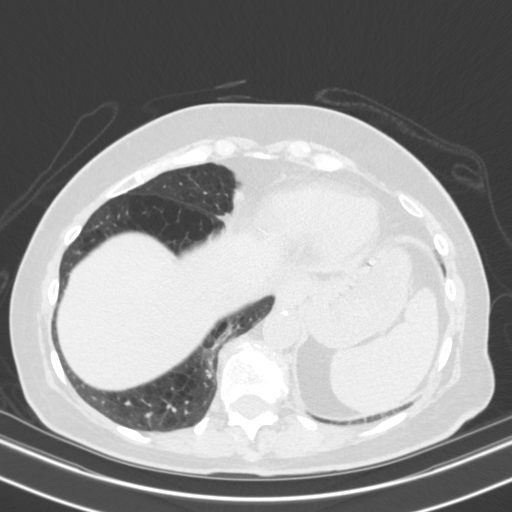
[im 53/159  mediastinal]
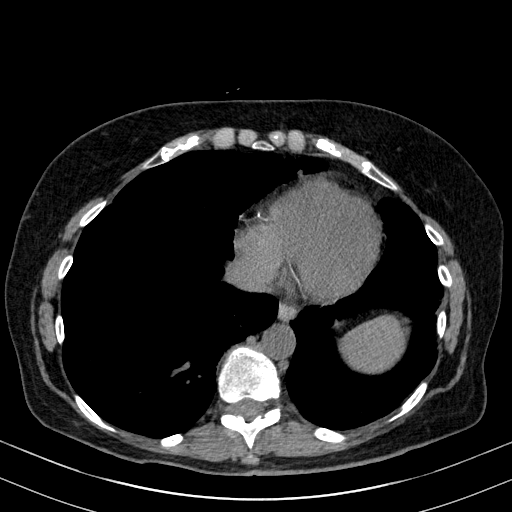
[im 53/159  lung]
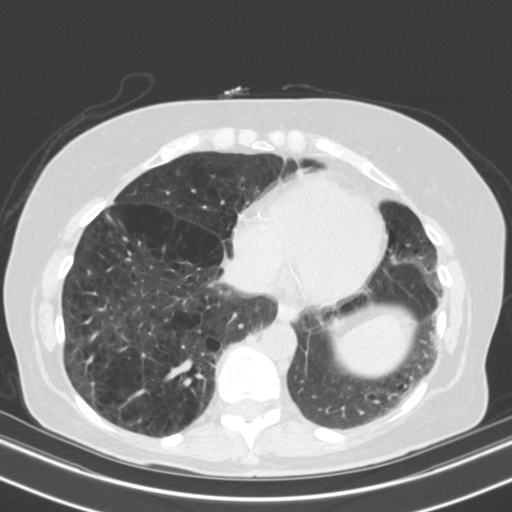
[im 64/159  lung]
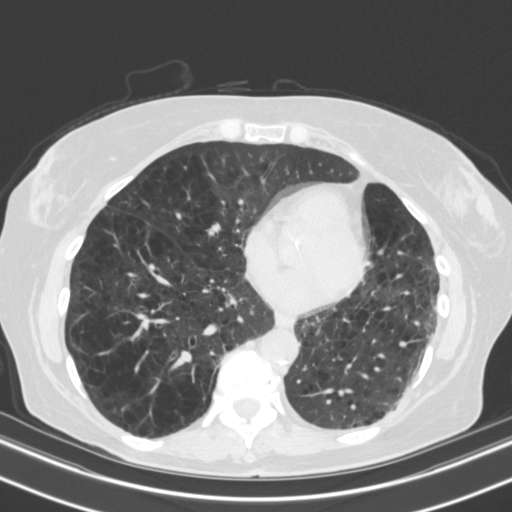
[im 71/159  lung]
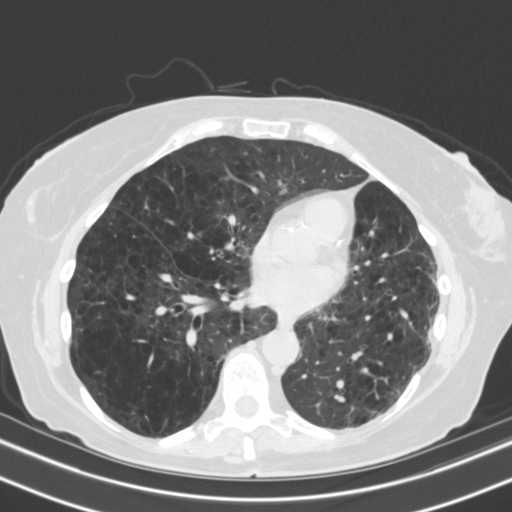
[im 82/159  lung]
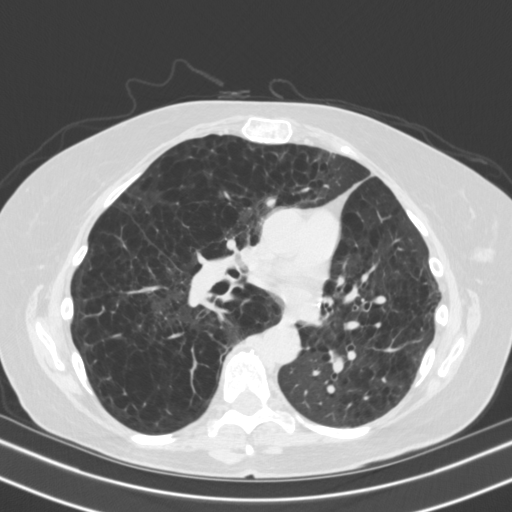
[im 88/159  mediastinal]
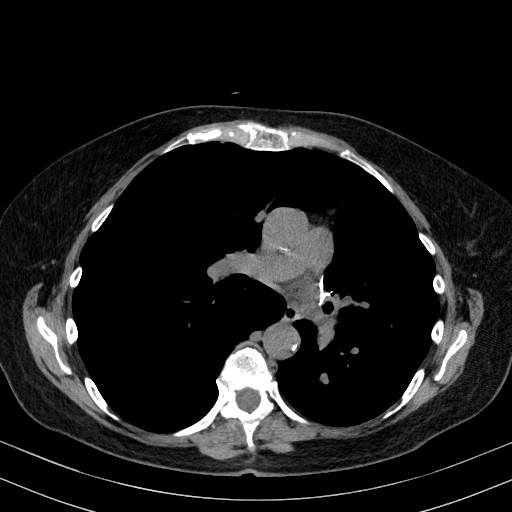
[im 88/159  lung]
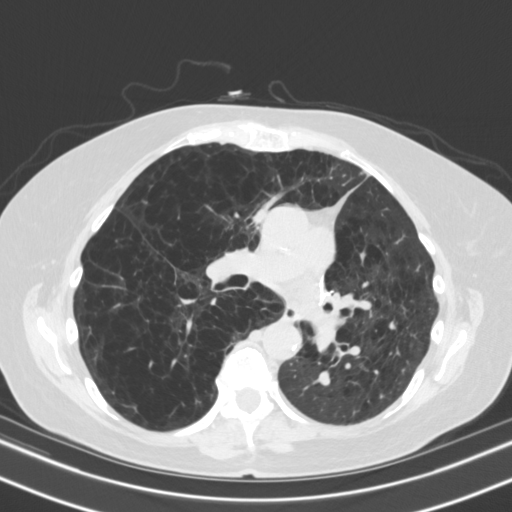
[im 95/159  lung]
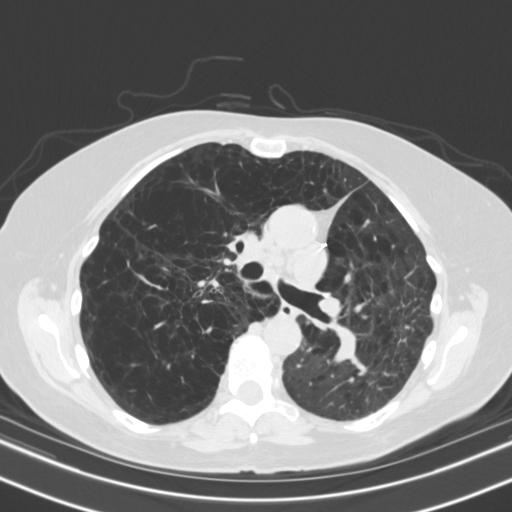
[im 106/159  lung]
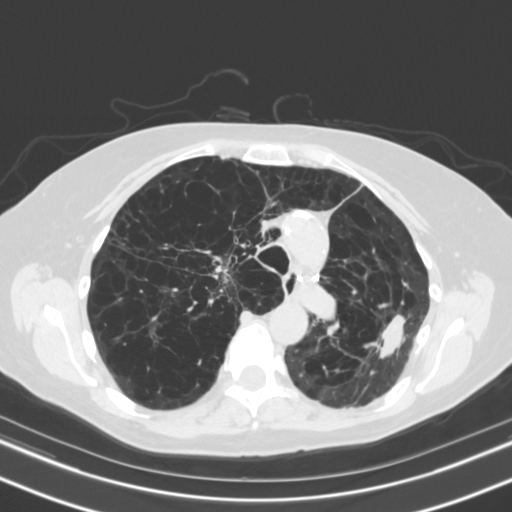
[im 118/159  lung]
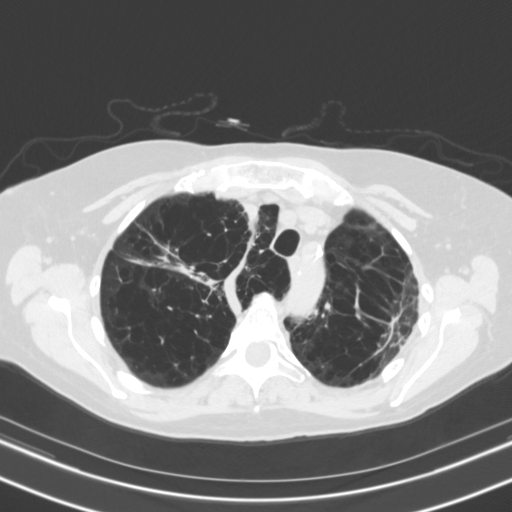
[im 127/159  mediastinal]
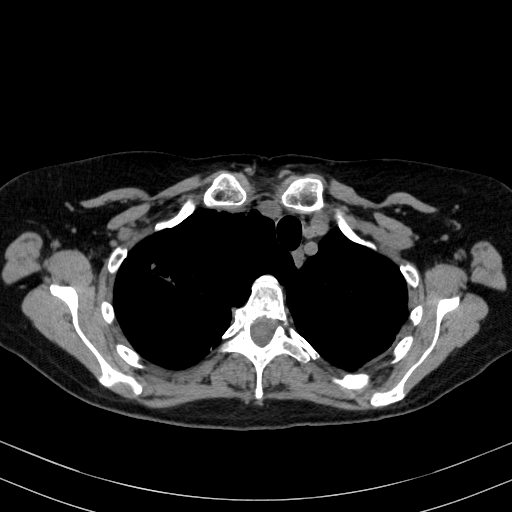
[im 127/159  lung]
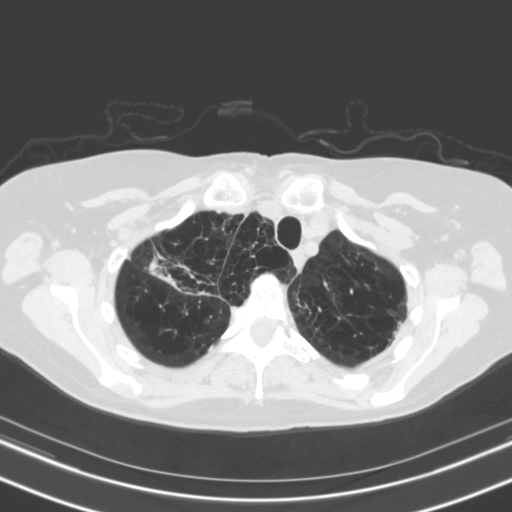
[im 135/159  lung]
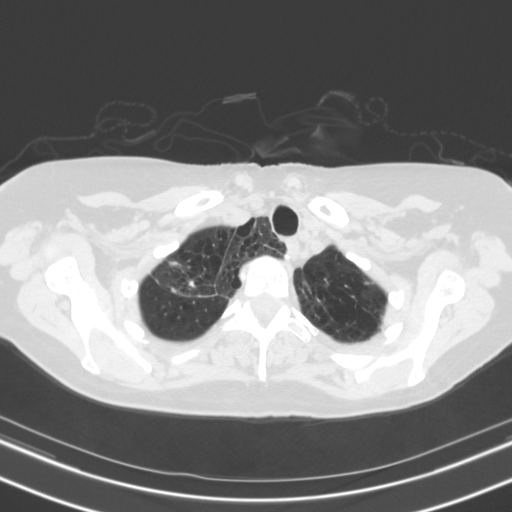
[im 147/159  lung]
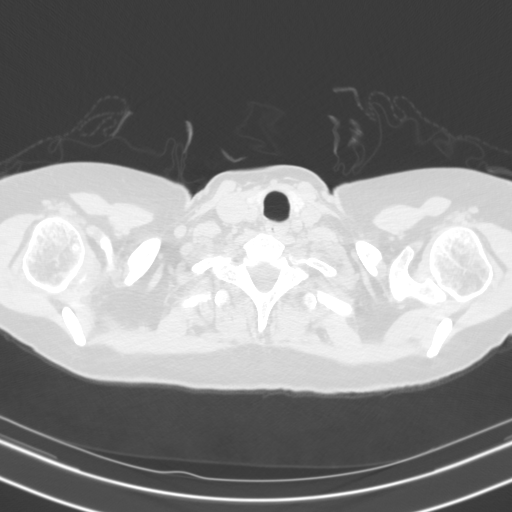

[15 of 34 positions shown; findings below may reference images not displayed]

FINDINGS: Cardiovascular: Atherosclerosis of the aorta, great vessels and
coronary arteries. No acute vascular findings are evident on
noncontrast imaging. The heart size is normal. There is no
pericardial effusion.

Mediastinum/Nodes: There are no enlarged mediastinal, hilar or
axillary lymph nodes.Small mediastinal lymph nodes are unchanged.
Hilar assessment is limited by the lack of intravenous contrast,
although the hilar contours appear unchanged. The thyroid gland,
trachea and esophagus demonstrate no significant findings.

Lungs/Pleura: There is no pleural effusion or pneumothorax. Severe
centrilobular and paraseptal emphysema again noted. Patient is
status post left upper lobectomy. There is stable scarring
posteriorly in the right upper lobe and right upper lobe nodularity
measuring up to 11 mm on image 48/3. Dominant nodule peripherally in
the left lower lobe appears similar, measuring 3.2 x 1.4 cm on image
55/3. The subpleural nodule more inferiorly also appears stable,
measuring 1.5 x 0.7 cm on image 104/3 (most obvious on the sagittal
images). No new or enlarging pulmonary nodules identified.

Upper abdomen: The visualized upper abdomen appears stable without
significant findings. Previous cholecystectomy. Vascular
calcifications are noted.

Musculoskeletal/Chest wall: There is no chest wall mass or
suspicious osseous finding. Stable degenerative changes in the
spine.
IMPRESSION: 1. Status post left upper lobectomy without evidence of local
recurrence or definite metastatic disease.
2. Bilateral pulmonary nodularity appears unchanged from 03/27/2020.
Suggest continued CT follow-in 6-12 months.
3. No thoracic adenopathy, pleural effusion or acute findings.
4. Coronary and Aortic Atherosclerosis (ST7Y6-G4L.L) and Emphysema
(ST7Y6-TL5.N).

## 2022-03-14 ENCOUNTER — Encounter (HOSPITAL_BASED_OUTPATIENT_CLINIC_OR_DEPARTMENT_OTHER): Payer: Self-pay | Admitting: Pulmonary Disease

## 2022-03-14 ENCOUNTER — Ambulatory Visit (INDEPENDENT_AMBULATORY_CARE_PROVIDER_SITE_OTHER): Payer: Medicare Other | Admitting: Pulmonary Disease

## 2022-03-14 VITALS — BP 100/82 | HR 82 | Ht 65.5 in | Wt 133.8 lb

## 2022-03-14 DIAGNOSIS — J449 Chronic obstructive pulmonary disease, unspecified: Secondary | ICD-10-CM | POA: Diagnosis not present

## 2022-03-14 MED ORDER — FLUTICASONE FUROATE-VILANTEROL 200-25 MCG/ACT IN AEPB: 1.0000 | INHALATION_SPRAY | Freq: Every day | RESPIRATORY_TRACT | 11 refills | Status: DC

## 2022-03-14 NOTE — Patient Instructions (Signed)
  Moderate COPD with emphysema and bronchitis - controlled for now CONTINUE Breo 200-25 mcg ONE puff ONCE a day.  CONTINUE Flonase 1 spray per nare daily Offered pulmonary rehab. Declined for now. Patient will work on increasing activity  History of stage Ia lung cancer status post left upper lobe lobectomy in 2005 Right upper lobe nodule status post nondiagnostic navigational bronchoscopy in 2016 Stable nodules Following CT surgery with Dr. Roxan Hockey.   Follow-up with me 1 year

## 2022-03-14 NOTE — Progress Notes (Signed)
Subjective:   PATIENT ID: Tammy Eaton GENDER: female DOB: Jun 11, 1950, MRN: [id number]   HPI  Chief Complaint  Patient presents with   Follow-up    Memory Dance is causing a cough   Reason for Visit: Follow-up  Tammy Eaton is a 72 year old female with COPD, history of lung nodule, hx of lung cancer in 2004, anxiety/depression and GERD who presents for follow-up.   Synopsis: Diagnosed with COPD 15 years ago. She was only started bronchodilators Memory Dance) five years ago. Denies COPD exacerbations requiring ED or hospitalizations. She rarely gets treated for bronchitis or exacerbations as an outpatient. She quit smoking 2010.   2022 - Tolerated Wixela 2023 - Stopped Wixela due to coughing. Tolerated Breo 2024 - Stopped Breo due to coughing but re trying and seeming to tolerate  03/14/22 Since our last visit she reports Memory Dance is making her abruptly cough deeply. Same thing happened to the [facility]. Denies shortness of breath or wheezing. May be related to taking powder during cold weather/decreased humidity. No exacerbations in the last 12 months. No limitations in activity including yardwork and walking regularly. Back pain does stop her at times with activities that requiring standing for long periods of time.  Prior inhalers: Spiriva - thrush Trelegy - chest congestion Breo - insurance Wixela - cough  Social History: Former smoker. 80 pack years. Quit in 2010. Previously had a wood stove in house Her daughter-in-law passed away in 14-May-2020 from metastatic breast cancer. Since then she has been involved with helping her son with caring for granddaughters three days a week. She has had difficulty communicating with her grieving son. This has been a stressor as she has assumed a caregiver role and involved with the children's counseling and schooling. 2023- remains caregiver with her son   Past Medical History:  Diagnosis Date   Anxiety    Arthritis 2018   Bulging lumbar disc  8250   Complication of anesthesia    "low blood pressure after retinal detachment surgery"   Constipation    COPD (chronic obstructive pulmonary disease) (HCC)    Coronary artery disease    follows up with dr Harrington Challenger prn   Degenerative disc disease, lumbar 2018   Depression    Emphysema of lung ([place])    Family history of ovarian cancer    mother died of ovarian cancer   GERD (gastroesophageal reflux disease)    History of hiatal hernia    small   History of kidney stones    History of pneumonia    "multiple times"   History of stomach ulcers yrs ago   Hyperlipidemia    lung ca 05-15-03   Multiple lung nodules    followed by dr Sherlon Handing 04-10-2020   Osteoarthritis    spine, knees, hands   PONV (postoperative nausea and vomiting)    pt "could not eat during hospital stay"   Reflux esophagitis    Scoliosis 2018   Shortness of breath    with lifting heavy things   Wears glasses       Outpatient Medications Prior to Visit  Medication Sig Dispense Refill   amitriptyline (ELAVIL) 100 MG tablet Take 100 mg by mouth at bedtime.     celecoxib (CELEBREX) 200 MG capsule Take by mouth.     clonazePAM (KLONOPIN) 1 MG tablet Take 1 mg by mouth at bedtime.     Ergocalciferol 50 MCG (2000 UT) CAPS 1 capsule Orally Once a day  fluticasone (FLONASE) 50 MCG/ACT nasal spray Place 2 sprays into both nostrils daily.     Glucosamine 750 MG TABS as directed Orally Three times a day     polyvinyl alcohol (LIQUIFILM TEARS) 1.4 % ophthalmic solution Place 1 drop into both eyes as needed for dry eyes.     rosuvastatin (CRESTOR) 5 MG tablet SMARTSIG:1 Tablet(s) By Mouth Daily on Weekdays     tiZANidine (ZANAFLEX) 4 MG capsule Take 4 mg by mouth 3 (three) times daily as needed for muscle spasms.     traMADol (ULTRAM) 50 MG tablet SMARTSIG:1 Tablet(s) By Mouth Every 12 Hours PRN     UNABLE TO FIND Vitamin d  2000 iu daily     BREO ELLIPTA 200-25 MCG/ACT AEPB TAKE 1 PUFF BY MOUTH EVERY DAY 60 each 5    docusate sodium (COLACE) 100 MG capsule Take 100 mg by mouth daily. (Patient not taking: Reported on 03/14/2022)     LINZESS 72 MCG capsule Take 72 mcg by mouth daily before breakfast.     No facility-administered medications prior to visit.    Review of Systems  Constitutional:  Negative for chills, diaphoresis, fever, malaise/fatigue and weight loss.  HENT:  Negative for congestion.   Respiratory:  Positive for cough. Negative for hemoptysis, sputum production, shortness of breath and wheezing.   Cardiovascular:  Negative for chest pain, palpitations and leg swelling.     Objective:   Vitals:   03/14/22 1355  BP: 100/82  Pulse: 82  SpO2: 96%  Weight: 133 lb 12.8 oz (60.7 kg)  Height: 5' 5.5" (1.664 m)   SpO2: 96 % O2 Device: None (Room air)  Physical Exam: General: Well-appearing, no acute distress HENT: [place], AT Eyes: EOMI, no scleral icterus Respiratory: Clear to auscultation bilaterally.  No crackles, wheezing or rales Cardiovascular: RRR, -M/R/G, no JVD Extremities:-Edema,-tenderness Neuro: AAO x4, CNII-XII grossly intact Psych: Normal mood, normal affect  Data Reviewed:  Imaging: CT chest 03/26/2018-status post left upper lobe lobectomy with left mediastinal shift.  Stable right upper lobe and left upper lobe nodules.  Very severe centrilobular emphysema in the background  CT 03/28/19 - Unchanged compared to 03/26/18 exam with stable nodules.   CT 07/13/20 - Unchanged with stable nodules in RUL and LLL  CT Chest 11/23/20 - S/p LUL lobectomy with left mediastinal shift. Unchanged pulmonary nodules >1 cm. Severe centrilobular and paraseptal emphysema in the background.  CT Chest 09/12/21 - Interval resolution of LLL nodule previously seen on 05/30/21. S/p LUL lobectomy. Emphysema  PFT: 11/28/2014 FVC 3.68 (109%) FEV1 1.87 (72%) Ratio 51 TLC 121% DLCO 43% Interpretation: Mild obstructive defect with moderately reduced DLCO.  No significant bronchodilator response.   No air trapping.  04/11/19 FVC 3.06 (93%) FEV1 1.65 (66%) Ratio 53  TLC 116% DLCO 49% Interpretation: Moderate obstructive defect with air trapping and reduced DLCO consistent with emphysema. Worsened air obstruction compared to 2016.  Assessment & Plan:   Discussion: 72 year old female with COPD, hx of multiple lung nodules, hx lung cancer in 2004 s/p LUL lobectomy who presents for follow-up. Well-controlled. Discussed clinical course and management of COPD including bronchodilator regimen and action plan for exacerbation.  Moderate COPD with emphysema and bronchitis - controlled for now CONTINUE Breo 200-25 mcg ONE puff ONCE a day.  CONTINUE Flonase 1 spray per nare daily Offered pulmonary rehab. Declined for now. Patient will work on increasing activity  History of stage Ia lung cancer status post left upper lobe lobectomy in 2005 Right  upper lobe nodule status post nondiagnostic navigational bronchoscopy in 2016 Stable nodules Following CT surgery with Dr. Roxan Hockey.   Health Maintenance Immunization History  Administered Date(s) Administered   Fluad Quad(high Dose 65+) 10/26/2019   Influenza Split 10/22/2007, 11/20/2011, 11/08/2012, 11/04/2014, 11/13/2020   Influenza, High Dose Seasonal PF 10/25/2016, 10/19/2017, 09/23/2018   Influenza,inj,Quad PF,6+ Mos 10/30/2015   PFIZER(Purple Top)SARS-COV-2 Vaccination 03/11/2019, 03/31/2019, 10/31/2019, 05/04/2020   Pfizer Covid-19 Vaccine Bivalent Booster 38yrs & up 10/21/2020   Pneumococcal Conjugate-13 02/22/2015   Pneumococcal Polysaccharide-23 02/10/2005, 02/06/2011, 12/24/2016, 01/15/2021   Td 08/31/2001   Tdap 02/04/2011, 01/15/2021   Zoster Recombinat (Shingrix) 01/17/2018, 04/12/2018   Zoster, Live 02/04/2011   CT Lung Screen - not qualified. Hx lung cancer  No orders of the defined types were placed in this encounter.  Meds ordered this encounter  Medications   fluticasone furoate-vilanterol (BREO ELLIPTA) 200-25  MCG/ACT AEPB    Sig: Inhale 1 puff into the lungs daily.    Dispense:  60 each    Refill:  11   Return in about 1 year (around 03/15/2023).   I have spent a total time of 35-minutes on the day of the appointment including chart review, data review, collecting history, coordinating care and discussing medical diagnosis and plan with the patient/family. Past medical history, allergies, medications were reviewed. Pertinent imaging, labs and tests included in this note have been reviewed and interpreted independently by me.  [place], MD [facility] 03/14/2022 2:33 PM  Office Number [phone]

## 2022-03-20 ENCOUNTER — Ambulatory Visit
Admission: RE | Admit: 2022-03-20 | Discharge: 2022-03-20 | Disposition: A | Discharge status: Home / Self Care | Payer: Medicare Other | Source: Ambulatory Visit | Attending: Thoracic Surgery (Cardiothoracic Vascular Surgery) | Admitting: Thoracic Surgery (Cardiothoracic Vascular Surgery)

## 2022-03-20 DIAGNOSIS — R911 Solitary pulmonary nodule: Secondary | ICD-10-CM

## 2022-03-25 ENCOUNTER — Ambulatory Visit: Payer: Medicare Other | Admitting: Thoracic Surgery (Cardiothoracic Vascular Surgery)

## 2022-03-25 ENCOUNTER — Encounter: Payer: Self-pay | Admitting: Thoracic Surgery (Cardiothoracic Vascular Surgery)

## 2022-03-25 VITALS — BP 101/69 | HR 90 | Resp 20 | Ht 65.5 in | Wt 134.0 lb

## 2022-03-25 DIAGNOSIS — Z85118 Personal history of other malignant neoplasm of bronchus and lung: Secondary | ICD-10-CM

## 2022-03-25 NOTE — Progress Notes (Signed)
[address] SpringsSuite 411       Eaton,Tammy Eaton             [phone]     HPI: Tammy Eaton returns for follow-up of her lung nodules.  Tammy Eaton is a 72 year old woman with a history of tobacco abuse, COPD, stage Ia non-small cell carcinoma of the lung (2005), multiple lung nodules, hyperlipidemia, osteoarthritis, anxiety, depression, degenerative disc disease, and arthritis.  She had an 80-pack-year history of smoking prior to quitting in 2010.  Left upper lobectomy for non-small cell carcinoma in 2005.  She is been followed for waxing and waning lung nodules dating back to 2016.    I last saw her in August 2023 for a 4-month follow-up of a new groundglass opacity.  In the interim since that visit she is continued to have issues with her COPD.  She is followed by Dr. Loanne Drilling.  Currently having issues with her insurance company about paying for medications.  Past Medical History:  Diagnosis Date   Anxiety    Arthritis 2018   Bulging lumbar disc 1740   Complication of anesthesia    "low blood pressure after retinal detachment surgery"   Constipation    COPD (chronic obstructive pulmonary disease) (HCC)    Coronary artery disease    follows up with dr Harrington Challenger prn   Degenerative disc disease, lumbar 2018   Depression    Emphysema of lung ([place])    Family history of ovarian cancer    mother died of ovarian cancer   GERD (gastroesophageal reflux disease)    History of hiatal hernia    small   History of kidney stones    History of pneumonia    "multiple times"   History of stomach ulcers yrs ago   Hyperlipidemia    lung ca 05/2003   Multiple lung nodules    followed by dr Sherlon Handing 04-10-2020   Osteoarthritis    spine, knees, hands   PONV (postoperative nausea and vomiting)    pt "could not eat during hospital stay"   Reflux esophagitis    Scoliosis 2018   Shortness of breath    with lifting heavy things   Wears glasses     Current Outpatient  Medications  Medication Sig Dispense Refill   amitriptyline (ELAVIL) 100 MG tablet Take 100 mg by mouth at bedtime.     celecoxib (CELEBREX) 200 MG capsule Take by mouth.     clonazePAM (KLONOPIN) 1 MG tablet Take 1 mg by mouth at bedtime.     Ergocalciferol 50 MCG (2000 UT) CAPS 1 capsule Orally Once a day     fluticasone (FLONASE) 50 MCG/ACT nasal spray Place 2 sprays into both nostrils daily.     fluticasone furoate-vilanterol (BREO ELLIPTA) 200-25 MCG/ACT AEPB Inhale 1 puff into the lungs daily. 60 each 11   Glucosamine 750 MG TABS as directed Orally Three times a day     polyvinyl alcohol (LIQUIFILM TEARS) 1.4 % ophthalmic solution Place 1 drop into both eyes as needed for dry eyes.     rosuvastatin (CRESTOR) 5 MG tablet SMARTSIG:1 Tablet(s) By Mouth Daily on Weekdays     tiZANidine (ZANAFLEX) 4 MG capsule Take 4 mg by mouth 3 (three) times daily as needed for muscle spasms.     traMADol (ULTRAM) 50 MG tablet SMARTSIG:1 Tablet(s) By Mouth Every 12 Hours PRN     UNABLE TO FIND Vitamin d  2000 iu daily  docusate sodium (COLACE) 100 MG capsule Take 100 mg by mouth daily.     LINZESS 72 MCG capsule Take 72 mcg by mouth daily before breakfast.     No current facility-administered medications for this visit.    Physical Exam BP 101/69   Pulse 90   Resp 20   Ht 5' 5.5" (1.664 m)   Wt 134 lb (60.8 kg)   LMP 09/26/2013 (Exact Date)   SpO2 95% Comment: RA  BMI 21.70 kg/m  72 year old woman in no acute distress Alert and oriented x 3 with no focal deficits No cervical or subclavicular adenopathy Diminished breath sounds bilaterally, no wheezing Cardiac regular rate and rhythm  Diagnostic Tests: CT CHEST WITHOUT CONTRAST   TECHNIQUE: Multidetector CT imaging of the chest was performed following the standard protocol without IV contrast.   RADIATION DOSE REDUCTION: This exam was performed according to the departmental dose-optimization program which includes automated exposure  control, adjustment of the mA and/or kV according to patient size and/or use of iterative reconstruction technique.   COMPARISON:  09/12/2021   FINDINGS: Cardiovascular: Heart size is normal. There is aortic atherosclerosis and coronary artery calcifications. No pericardial effusion.   Mediastinum/Nodes: Thyroid gland, trachea, and esophagus are unremarkable. No enlarged axillary, supraclavicular or mediastinal lymph nodes. No axillary adenopathy.   Lungs/Pleura: Severe changes of centrilobular and paraseptal emphysema. Postoperative changes from left upper lobectomy. Unchanged appearance elongated nodular density within the periphery of the left midlung which measures 2.8 by 1.1 cm, image 59/8. Along the posterior aspect of the right upper lobe there is subpleural scarring abutting posterior aspect of the major fissure with secondary volume loss. The previously reported nodular component to this area measures 1.0 x 1.0 cm, image 49/8. This is stable compared with the previous exam. Calcified granulomas are again seen. No new or suspicious lesions identified at this time   Upper Abdomen: No acute abnormality. Status post cholecystectomy. Nonobstructing left kidney stones.   Musculoskeletal: No chest wall mass or suspicious bone lesions identified. Remote healed sternal fracture. Thoracolumbar scoliosis and multilevel degenerative disc disease.   IMPRESSION: 1. Stable appearance of elongated nodular density within the periphery of the left midlung. 2. Stable appearance of right upper lobe subpleural scarring abutting the posterior aspect of the right major fissure with secondary volume loss. The previously reported nodular component to this area measures 1.0 x 1.0 cm, and is stable compared with the previous exam. 3. Coronary artery calcifications. 4. Nonobstructing left kidney stones. 5. Aortic Atherosclerosis (ICD10-I70.0) and Emphysema (ICD10-J43.9).     Electronically  Signed   By: Kerby Moors M.D.   On: 03/20/2022 11:50 I personally reviewed her CT images and there has been no significant change from prior films.  Severe emphysema.  Multiple stable lung nodules.  Aortic and coronary atherosclerosis.  Impression: Ursula Dermody is a 72 year old woman with a history of tobacco abuse, COPD, stage Ia non-small cell carcinoma of the lung (2005), multiple lung nodules, hyperlipidemia, osteoarthritis, anxiety, depression, degenerative disc disease, and arthritis.  She had an 80-pack-year history of smoking prior to quitting in 2010.  Lung nodules-no change in her lung nodules compared to her scan in August.  She has extremely complex lungs and we will never be able to clear them altogether.  Currently there is no indication for additional workup or intervention.  Will plan to get another CT in a year.  COPD-followed by Dr. Loanne Drilling  Plan: Return in 1 year with CT chest  [name], MD  [facility] [phone]

## 2022-10-29 ENCOUNTER — Other Ambulatory Visit: Payer: Self-pay | Admitting: Family Medicine

## 2022-10-29 DIAGNOSIS — Z1231 Encounter for screening mammogram for malignant neoplasm of breast: Secondary | ICD-10-CM

## 2022-10-31 ENCOUNTER — Ambulatory Visit: Payer: Medicare Other

## 2022-11-27 ENCOUNTER — Ambulatory Visit
Admission: RE | Admit: 2022-11-27 | Discharge: 2022-11-27 | Disposition: A | Discharge status: Home / Self Care | Payer: Medicare Other | Source: Ambulatory Visit | Attending: Family Medicine | Admitting: Family Medicine

## 2022-11-27 DIAGNOSIS — Z1231 Encounter for screening mammogram for malignant neoplasm of breast: Secondary | ICD-10-CM

## 2023-02-11 ENCOUNTER — Other Ambulatory Visit: Payer: Self-pay | Admitting: Thoracic Surgery (Cardiothoracic Vascular Surgery)

## 2023-02-11 DIAGNOSIS — R911 Solitary pulmonary nodule: Secondary | ICD-10-CM

## 2023-02-18 ENCOUNTER — Encounter (HOSPITAL_BASED_OUTPATIENT_CLINIC_OR_DEPARTMENT_OTHER): Payer: Self-pay | Admitting: Pulmonary Disease

## 2023-02-19 ENCOUNTER — Other Ambulatory Visit (HOSPITAL_COMMUNITY): Payer: Self-pay

## 2023-02-20 MED ORDER — FLUTICASONE-SALMETEROL 250-50 MCG/ACT IN AEPB: 1.0000 | INHALATION_SPRAY | Freq: Two times a day (BID) | RESPIRATORY_TRACT | 2 refills | Status: DC

## 2023-03-10 DIAGNOSIS — M47816 Spondylosis without myelopathy or radiculopathy, lumbar region: Secondary | ICD-10-CM | POA: Diagnosis not present

## 2023-03-10 DIAGNOSIS — M5416 Radiculopathy, lumbar region: Secondary | ICD-10-CM | POA: Diagnosis not present

## 2023-03-10 DIAGNOSIS — Z6822 Body mass index (BMI) 22.0-22.9, adult: Secondary | ICD-10-CM | POA: Diagnosis not present

## 2023-03-18 ENCOUNTER — Ambulatory Visit
Admission: RE | Admit: 2023-03-18 | Discharge: 2023-03-18 | Disposition: A | Discharge status: Home / Self Care | Payer: Medicare HMO | Source: Ambulatory Visit | Attending: Thoracic Surgery (Cardiothoracic Vascular Surgery) | Admitting: Thoracic Surgery (Cardiothoracic Vascular Surgery)

## 2023-03-18 DIAGNOSIS — Z902 Acquired absence of lung [part of]: Secondary | ICD-10-CM | POA: Diagnosis not present

## 2023-03-18 DIAGNOSIS — R918 Other nonspecific abnormal finding of lung field: Secondary | ICD-10-CM | POA: Diagnosis not present

## 2023-03-18 DIAGNOSIS — R911 Solitary pulmonary nodule: Secondary | ICD-10-CM

## 2023-03-23 ENCOUNTER — Ambulatory Visit (HOSPITAL_BASED_OUTPATIENT_CLINIC_OR_DEPARTMENT_OTHER): Payer: Medicare HMO | Admitting: Pulmonary Disease

## 2023-03-23 ENCOUNTER — Encounter (HOSPITAL_BASED_OUTPATIENT_CLINIC_OR_DEPARTMENT_OTHER): Payer: Self-pay | Admitting: Pulmonary Disease

## 2023-03-23 VITALS — BP 118/74 | HR 84 | Ht 65.5 in | Wt 140.2 lb

## 2023-03-23 DIAGNOSIS — J449 Chronic obstructive pulmonary disease, unspecified: Secondary | ICD-10-CM

## 2023-03-23 MED ORDER — FLUTICASONE-SALMETEROL 250-50 MCG/ACT IN AEPB: 1.0000 | INHALATION_SPRAY | Freq: Two times a day (BID) | RESPIRATORY_TRACT | 11 refills | Status: DC

## 2023-03-23 NOTE — Patient Instructions (Addendum)
 Moderate COPD with emphysema and bronchitis - overall controlled CONTINUE Wixela 250-50 mcg ONE puff TWICE a day.  ORDER pulmonary function test at next visit  Borderline-low oxygen  No indication for oxygen during the day at this time ORDER overnight oximetry on room air

## 2023-03-23 NOTE — Progress Notes (Signed)
 Subjective:   PATIENT ID: Tammy Eaton GENDER: female DOB: 27-Aug-1950, MRN: [id number]   HPI  Chief Complaint  Patient presents with   Follow-up    COPD   Reason for Visit: Follow-up  [name] is a 73 year old female with COPD, history of lung nodule, hx of lung cancer in 2004, anxiety/depression and GERD who presents for follow-up.   Synopsis: Diagnosed with COPD 15 years ago. She was only started bronchodilators Virgel Bou[place]) five years ago. Denies COPD exacerbations requiring ED or hospitalizations. She rarely gets treated for bronchitis or exacerbations as an outpatient. She quit smoking 2010.   2022 - Tolerated Wixela 2023 - Stopped Wixela due to coughing. Tolerated Breo 2024 - Stopped Breo due to coughing but re trying and seeming to tolerate this year with a few breaks 2025  - Switched to [place] in Feb due to insurance.  03/23/23 Since our last visit she has been on Breo however had to switch this month due to insurance deductible. In general tolerated Breo for 4-5 weeks but will develop severe cough and would take a break for a few days before restarting for the same cycle. Currently on Wixela and no issues. She reports mild covid in the summer and mild URI last month. Otherwise no exacerbations that required urgent care or ED visit in the last 12 months. No limitations in activity due to breathing. Does have joint pain including scoliosis and knee related pain.  Prior inhalers: Spiriva - thrush Trelegy - chest congestion Breo - insurance Wixela - cough  Social History: Former smoker. 80 pack years. Quit in 2010. Previously had a wood stove in house Her daughter-in-law passed away in 2022-05-09from metastatic breast cancer. Since then she has been involved with helping her son with caring for granddaughters three days a week. She has had difficulty communicating with her grieving son. This has been a stressor as she has assumed a caregiver role and involved with  the children's counseling and schooling. 2023- remains caregiver with her son   Past Medical History:  Diagnosis Date   Anxiety    Arthritis 2018   Bulging lumbar disc 2018   Complication of anesthesia    "low blood pressure after retinal detachment surgery"   Constipation    COPD (chronic obstructive pulmonary disease) (HCC)    Coronary artery disease    follows up with dr Tenny Craw prn   Degenerative disc disease, lumbar 2018   Depression    Emphysema of lung (HCC)    Family history of ovarian cancer    mother died of ovarian cancer   GERD (gastroesophageal reflux disease)    History of hiatal hernia    small   History of kidney stones    History of pneumonia    "multiple times"   History of stomach ulcers yrs ago   Hyperlipidemia    lung ca 2003-06-12   Multiple lung nodules    followed by dr Ollen Gross 04-10-2020   Osteoarthritis    spine, knees, hands   PONV (postoperative nausea and vomiting)    pt "could not eat during hospital stay"   Reflux esophagitis    Scoliosis 2018   Shortness of breath    with lifting heavy things   Wears glasses       Outpatient Medications Prior to Visit  Medication Sig Dispense Refill   amitriptyline (ELAVIL) 100 MG tablet Take 100 mg by mouth at bedtime.     clonazePAM (KLONOPIN)  1 MG tablet Take 1 mg by mouth at bedtime.     Ergocalciferol 50 MCG (2000 UT) CAPS 1 capsule Orally Once a day     polyvinyl alcohol (LIQUIFILM TEARS) 1.4 % ophthalmic solution Place 1 drop into both eyes as needed for dry eyes.     rosuvastatin (CRESTOR) 5 MG tablet SMARTSIG:1 Tablet(s) By Mouth Daily on Weekdays     tiZANidine (ZANAFLEX) 4 MG capsule Take 4 mg by mouth 3 (three) times daily as needed for muscle spasms.     traMADol (ULTRAM) 50 MG tablet SMARTSIG:1 Tablet(s) By Mouth Every 12 Hours PRN     UNABLE TO FIND Vitamin d  2000 iu daily     fluticasone-salmeterol (WIXELA INHUB) 250-50 MCG/ACT AEPB Inhale 1 puff into the lungs in the morning and at  bedtime. 60 each 2   celecoxib (CELEBREX) 200 MG capsule Take by mouth. (Patient not taking: Reported on 03/23/2023)     docusate sodium (COLACE) 100 MG capsule Take 100 mg by mouth daily. (Patient not taking: Reported on 03/23/2023)     fluticasone (FLONASE) 50 MCG/ACT nasal spray Place 2 sprays into both nostrils daily. (Patient not taking: Reported on 03/23/2023)     Glucosamine 750 MG TABS as directed Orally Three times a day (Patient not taking: Reported on 03/23/2023)     LINZESS 72 MCG capsule Take 72 mcg by mouth daily before breakfast. (Patient not taking: Reported on 03/23/2023)     No facility-administered medications prior to visit.    Review of Systems  Constitutional:  Negative for chills, diaphoresis, fever, malaise/fatigue and weight loss.  HENT:  Negative for congestion.   Respiratory:  Positive for cough. Negative for hemoptysis, sputum production, shortness of breath and wheezing.   Cardiovascular:  Negative for chest pain, palpitations and leg swelling.  Musculoskeletal:  Positive for joint pain.     Objective:   Vitals:   03/23/23 1045  BP: 118/74  Pulse: 84  SpO2: 92%  Weight: 140 lb 3.2 oz (63.6 kg)  Height: 5' 5.5" (1.664 m)    SpO2: 92 %  Physical Exam: General: Well-appearing, no acute distress HENT: [place], AT Eyes: EOMI, no scleral icterus Respiratory: Diminished to auscultation bilaterally.  No crackles, wheezing or rales Cardiovascular: RRR, -M/R/G, no JVD Extremities:-Edema,-tenderness Neuro: AAO x4, CNII-XII grossly intact Psych: Normal mood, normal affect  Data Reviewed:  Imaging: CT chest 03/26/2018-status post left upper lobe lobectomy with left mediastinal shift.  Stable right upper lobe and left upper lobe nodules.  Very severe centrilobular emphysema in the background  CT 03/28/19 - Unchanged compared to 03/26/18 exam with stable nodules.   CT 07/13/20 - Unchanged with stable nodules in RUL and LLL  CT Chest 11/23/20 - S/p LUL lobectomy with  left mediastinal shift. Unchanged pulmonary nodules >1 cm. Severe centrilobular and paraseptal emphysema in the background.  CT Chest 09/12/21 - Interval resolution of LLL nodule previously seen on 05/30/21. S/p LUL lobectomy. Emphysema  CT Chest 03/18/23 - Stable post-surgical changed from LUL resection. No recurrence of metastatic disease. LLL clustered nodularity likely inflammatory/infectious. Stable bilateral pulmonary nodules  PFT: 11/28/2014 FVC 3.68 (109%) FEV1 1.87 (72%) Ratio 51 TLC 121% DLCO 43% Interpretation: Mild obstructive defect with moderately reduced DLCO.  No significant bronchodilator response.  No air trapping.  04/11/19 FVC 3.06 (93%) FEV1 1.65 (66%) Ratio 53  TLC 116% DLCO 49% Interpretation: Moderate obstructive defect with air trapping and reduced DLCO consistent with emphysema. Worsened air obstruction compared to 2016.  Assessment &  Plan:   Discussion: 73 year old female with COPD, hx of multiple lung nodules, hx lung cancer in 2004 s/p LUL lobectomy who presents for follow-up. Reviewed CT chest findings with LLL nodularity likely infectious in setting of recent infection. Discussed clinical course and management of COPD including bronchodilator regimen and action plan for exacerbation.  Moderate COPD with emphysema and bronchitis - overall controlled CONTINUE Wixela 250-50 mcg ONE puff TWICE a day.  Previously pulmonary rehab. Declined for now. Patient will work on increasing activity  Borderline-low oxygen  No indication for oxygen during the day at this time ORDER overnight oximetry on room air  History of stage Ia lung cancer status post left upper lobe lobectomy in 2005 Right upper lobe nodule status post nondiagnostic navigational bronchoscopy in 2016 Stable nodules Following CT surgery with Dr. Dorris Fetch  Health Maintenance Immunization History  Administered Date(s) Administered   Fluad Quad(high Dose 65+) 10/26/2019   Influenza Split 10/22/2007,  11/20/2011, 11/08/2012, 11/04/2014, 11/13/2020   Influenza, High Dose Seasonal PF 10/25/2016, 10/19/2017, 09/23/2018   Influenza,inj,Quad PF,6+ Mos 10/30/2015   PFIZER(Purple Top)SARS-COV-2 Vaccination 03/11/2019, 03/31/2019, 10/31/2019, 05/04/2020   Pfizer Covid-19 Vaccine Bivalent Booster 44yrs & up 10/21/2020   Pneumococcal Conjugate-13 02/22/2015   Pneumococcal Polysaccharide-23 02/10/2005, 02/06/2011, 12/24/2016, 01/15/2021   Td 08/31/2001   Tdap 02/04/2011, 01/15/2021   Zoster Recombinant(Shingrix) 01/17/2018, 04/12/2018   Zoster, Live 02/04/2011   CT Lung Screen - not qualified. Hx lung cancer  Orders Placed This Encounter  Procedures   Pulse oximetry, overnight    On room air    Standing Status:   Future    Expiration Date:   03/22/2024    Scheduling Instructions:     On room air   Meds ordered this encounter  Medications   fluticasone-salmeterol (WIXELA INHUB) 250-50 MCG/ACT AEPB    Sig: Inhale 1 puff into the lungs in the morning and at bedtime.    Dispense:  60 each    Refill:  11    Generic   Return in about 7 months (around 10/21/2023) for after PFT.   I have spent a total time of 35-minutes on the day of the appointment including chart review, data review, collecting history, coordinating care and discussing medical diagnosis and plan with the patient/family. Past medical history, allergies, medications were reviewed. Pertinent imaging, labs and tests included in this note have been reviewed and interpreted independently by me.  Lynlee Stratton Mechele Collin, MD [facility] 03/23/2023 11:13 AM  Office Number [phone]

## 2023-03-24 ENCOUNTER — Encounter: Payer: Self-pay | Admitting: Thoracic Surgery (Cardiothoracic Vascular Surgery)

## 2023-03-24 ENCOUNTER — Ambulatory Visit: Payer: Medicare HMO | Admitting: Thoracic Surgery (Cardiothoracic Vascular Surgery)

## 2023-03-24 VITALS — BP 112/71 | HR 93 | Resp 20 | Wt 139.2 lb

## 2023-03-24 DIAGNOSIS — R911 Solitary pulmonary nodule: Secondary | ICD-10-CM

## 2023-03-24 DIAGNOSIS — C3412 Malignant neoplasm of upper lobe, left bronchus or lung: Secondary | ICD-10-CM

## 2023-03-24 NOTE — Progress Notes (Signed)
 [address]       Tammy Eaton [id number]             [phone]     HPI: Tammy Eaton returns for follow-up of lung nodules.  Tammy Eaton is a 73 year old woman with a history of tobacco abuse, COPD, stage Ia non-small cell carcinoma of the lung (2005), multiple lung nodules, hyperlipidemia, osteoarthritis, anxiety, depression, degenerative disc disease, and arthritis. She had an 80-pack-year history of smoking prior to quitting in 2010. Left upper lobectomy for non-small cell carcinoma in 2005.   She has been followed for waxing and waning lung nodules dating back to 2016.   I last saw her in February 2024.  She had all stable findings at that time.  Continues to have issues with COPD.  She is managed by Dr. Everardo All.  She saw her last week.  Did have a recent respiratory infection.  Symptoms have resolved.  Has issues with back pain and arthritis.  Past Medical History:  Diagnosis Date   Anxiety    Arthritis 2018   Bulging lumbar disc 2018   Complication of anesthesia    "low blood pressure after retinal detachment surgery"   Constipation    COPD (chronic obstructive pulmonary disease) (HCC)    Coronary artery disease    follows up with dr Tenny Craw prn   Degenerative disc disease, lumbar 2018   Depression    Emphysema of lung (HCC)    Family history of ovarian cancer    mother died of ovarian cancer   GERD (gastroesophageal reflux disease)    History of hiatal hernia    small   History of kidney stones    History of pneumonia    "multiple times"   History of stomach ulcers yrs ago   Hyperlipidemia    lung ca 05/2003   Multiple lung nodules    followed by dr Ollen Gross 04-10-2020   Osteoarthritis    spine, knees, hands   PONV (postoperative nausea and vomiting)    pt "could not eat during hospital stay"   Reflux esophagitis    Scoliosis 2018   Shortness of breath    with lifting heavy things   Wears glasses     Current Outpatient Medications   Medication Sig Dispense Refill   amitriptyline (ELAVIL) 100 MG tablet Take 100 mg by mouth at bedtime.     clonazePAM (KLONOPIN) 1 MG tablet Take 1 mg by mouth at bedtime.     Ergocalciferol 50 MCG (2000 UT) CAPS 1 capsule Orally Once a day     fluticasone (FLONASE) 50 MCG/ACT nasal spray Place 2 sprays into both nostrils daily.     fluticasone-salmeterol (WIXELA INHUB) 250-50 MCG/ACT AEPB Inhale 1 puff into the lungs in the morning and at bedtime. 60 each 11   polyvinyl alcohol (LIQUIFILM TEARS) 1.4 % ophthalmic solution Place 1 drop into both eyes as needed for dry eyes.     rosuvastatin (CRESTOR) 5 MG tablet SMARTSIG:1 Tablet(s) By Mouth Daily on Weekdays     tiZANidine (ZANAFLEX) 4 MG capsule Take 4 mg by mouth 3 (three) times daily as needed for muscle spasms.     traMADol (ULTRAM) 50 MG tablet SMARTSIG:1 Tablet(s) By Mouth Every 12 Hours PRN     UNABLE TO FIND Vitamin d  2000 iu daily     No current facility-administered medications for this visit.    Physical Exam BP 112/71 (BP Location: Left Arm, Patient Position: Sitting,  Cuff Size: Normal)   Pulse 93   Resp 20   Wt 139 lb 3.2 oz (63.1 kg)   LMP 09/26/2013 (Exact Date)   SpO2 94% Comment: RA  BMI 22.15 kg/m  73 year old woman in no acute distress Alert and oriented x 3 with no focal neurologic deficits Lungs diminished breath sounds bilaterally.  No wheezing currently Cardiac regular rate and rhythm No cervical or supraclavicular adenopathy   Diagnostic Tests: CT CHEST WITHOUT CONTRAST   TECHNIQUE: Multidetector CT imaging of the chest was performed following the standard protocol without IV contrast.   RADIATION DOSE REDUCTION: This exam was performed according to the departmental dose-optimization program which includes automated exposure control, adjustment of the mA and/or kV according to patient size and/or use of iterative reconstruction technique.   COMPARISON:  Chest CT 03/20/2022 and 09/12/2021.    FINDINGS: Cardiovascular: Atherosclerosis of the aorta, great vessels and coronary arteries. The heart size is normal. There is no pericardial effusion.   Mediastinum/Nodes: There are no enlarged mediastinal, hilar or axillary lymph nodes.Hilar assessment is limited by the lack of intravenous contrast, although the hilar contours appear unchanged. The thyroid gland, trachea and esophagus demonstrate no significant findings.   Lungs/Pleura: Stable postsurgical changes from previous left upper lobe resection. There is severe underlying centrilobular and paraseptal emphysema. No pleural effusion or pneumothorax. Stable solid central right upper lobe nodule measuring 1.1 x 0.9 cm on image 38/15 with peripheral chronic scarring or atelectasis in the right upper lobe. Stable partially calcified nodularity peripherally in the left lower lobe measuring 2.8 x 1.2 cm on image 45/5. There is new clustered nodularity medially in the left lower lobe (axial images 58 through 76 of series 5), measuring up to 8 mm on image 72/5, likely inflammatory/infectious. No other new or enlarging nodules are identified.   Upper abdomen: The visualized upper abdomen appears stable, without acute findings. Previous cholecystectomy. Renal cortical scarring and lobularity noted bilaterally.   Musculoskeletal/Chest wall: There is no chest wall mass or suspicious osseous finding. Multilevel spondylosis and scoliosis. Stable posttraumatic deformity of the sternal body.   IMPRESSION: 1. Stable postsurgical changes from previous left upper lobe resection. No evidence of local recurrence or metastatic disease. 2. New clustered nodularity medially in the left lower lobe, likely inflammatory/infectious. 3. Otherwise stable bilateral pulmonary nodules as described, favoring a benign etiology. 4. Aortic Atherosclerosis (ICD10-I70.0) and Emphysema (ICD10-J43.9).     Electronically Signed   By: Carey Bullocks  M.D.   On: 03/18/2023 14:47 I personally reviewed the CT images.  New cluster of small nodules in the left lower lobe.  Severe emphysema and architectural distortion.  Stable nodules in upper lobes bilaterally.  Impression: Tammy Eaton is a 73 year old woman with a history of tobacco abuse, COPD, stage Ia non-small cell carcinoma of the lung (2005), multiple lung nodules, hyperlipidemia, osteoarthritis, anxiety, depression, degenerative disc disease, and arthritis. She had an 80-pack-year history of smoking prior to quitting in 2010. Left upper lobectomy for non-small cell carcinoma in 2005.  Lung nodules-pre-existing nodules are all stable.  She does have a new cluster of small nodules in the left lower lobe.  More consistent with infectious inflammatory nodules than neoplasm.  Recommended follow-up CT in 1 year.  COPD-managed by Dr. Everardo All.  Recently noted to have borderline low oxygen.  She is being evaluated for possible home oxygen.  Plan: Return in 1 year with CT chest  Loreli Slot, MD [facility] [phone]

## 2023-03-26 ENCOUNTER — Encounter (HOSPITAL_BASED_OUTPATIENT_CLINIC_OR_DEPARTMENT_OTHER): Payer: Self-pay | Admitting: Pulmonary Disease

## 2023-03-26 MED ORDER — LEVOFLOXACIN 500 MG PO TABS: 500.0000 mg | ORAL_TABLET | Freq: Every day | ORAL | 0 refills | Status: DC

## 2023-03-26 NOTE — Telephone Encounter (Signed)
Please advise on antibiotics???

## 2023-03-27 ENCOUNTER — Other Ambulatory Visit: Payer: Self-pay | Admitting: Family Medicine

## 2023-03-27 DIAGNOSIS — E2839 Other primary ovarian failure: Secondary | ICD-10-CM

## 2023-03-31 DIAGNOSIS — J449 Chronic obstructive pulmonary disease, unspecified: Secondary | ICD-10-CM | POA: Diagnosis not present

## 2023-04-01 DIAGNOSIS — M170 Bilateral primary osteoarthritis of knee: Secondary | ICD-10-CM | POA: Diagnosis not present

## 2023-04-16 ENCOUNTER — Telehealth (HOSPITAL_BASED_OUTPATIENT_CLINIC_OR_DEPARTMENT_OTHER): Payer: Self-pay | Admitting: Pulmonary Disease

## 2023-04-16 DIAGNOSIS — J449 Chronic obstructive pulmonary disease, unspecified: Secondary | ICD-10-CM

## 2023-04-16 NOTE — Telephone Encounter (Signed)
 Overnight Oximetry Results Date: 03/31/23 SpO2 <88% 4 hour 54 min 20 sec.  [place] SpO2 81%  Qualified for oxygen  Assessment/Plan Nocturnal Hypoxemia --Please call patient regarding above oxygen test results. She qualifies and I recommend wearing 2L nightly. If patient ok please order as noted below: --START 2L O2 via [place] nightly NEW START

## 2023-04-17 NOTE — Telephone Encounter (Signed)
 Pt notified and order placed

## 2023-04-21 ENCOUNTER — Encounter (HOSPITAL_BASED_OUTPATIENT_CLINIC_OR_DEPARTMENT_OTHER): Payer: Self-pay | Admitting: Pulmonary Disease

## 2023-04-21 NOTE — Telephone Encounter (Signed)
 FYI

## 2023-05-06 ENCOUNTER — Encounter: Payer: Self-pay | Admitting: Pulmonary Disease

## 2023-05-26 DIAGNOSIS — M170 Bilateral primary osteoarthritis of knee: Secondary | ICD-10-CM | POA: Diagnosis not present

## 2023-06-08 DIAGNOSIS — M1711 Unilateral primary osteoarthritis, right knee: Secondary | ICD-10-CM | POA: Diagnosis not present

## 2023-06-09 DIAGNOSIS — G894 Chronic pain syndrome: Secondary | ICD-10-CM | POA: Diagnosis not present

## 2023-06-16 DIAGNOSIS — R1084 Generalized abdominal pain: Secondary | ICD-10-CM | POA: Diagnosis not present

## 2023-06-16 DIAGNOSIS — N200 Calculus of kidney: Secondary | ICD-10-CM | POA: Diagnosis not present

## 2023-06-16 DIAGNOSIS — N10 Acute tubulo-interstitial nephritis: Secondary | ICD-10-CM | POA: Diagnosis not present

## 2023-06-16 DIAGNOSIS — N201 Calculus of ureter: Secondary | ICD-10-CM | POA: Diagnosis not present

## 2023-06-16 DIAGNOSIS — N132 Hydronephrosis with renal and ureteral calculous obstruction: Secondary | ICD-10-CM | POA: Diagnosis not present

## 2023-06-25 DIAGNOSIS — G8929 Other chronic pain: Secondary | ICD-10-CM | POA: Diagnosis not present

## 2023-06-25 DIAGNOSIS — G4700 Insomnia, unspecified: Secondary | ICD-10-CM | POA: Diagnosis not present

## 2023-07-08 ENCOUNTER — Ambulatory Visit (HOSPITAL_BASED_OUTPATIENT_CLINIC_OR_DEPARTMENT_OTHER): Admitting: Orthopaedic Surgery

## 2023-07-08 ENCOUNTER — Encounter (HOSPITAL_BASED_OUTPATIENT_CLINIC_OR_DEPARTMENT_OTHER): Payer: Self-pay | Admitting: Orthopaedic Surgery

## 2023-07-08 ENCOUNTER — Ambulatory Visit (HOSPITAL_BASED_OUTPATIENT_CLINIC_OR_DEPARTMENT_OTHER)

## 2023-07-08 DIAGNOSIS — M1711 Unilateral primary osteoarthritis, right knee: Secondary | ICD-10-CM | POA: Diagnosis not present

## 2023-07-08 DIAGNOSIS — M25561 Pain in right knee: Secondary | ICD-10-CM | POA: Diagnosis not present

## 2023-07-08 DIAGNOSIS — G8929 Other chronic pain: Secondary | ICD-10-CM

## 2023-07-08 NOTE — Progress Notes (Signed)
 Chief Complaint: Right knee pain     History of Present Illness:    Tammy Eaton is a 73 y.o. female presents today with ongoing right knee pain particularly medially.  She does have a history of a medial meniscal debridement while she was in her 45s.  She has had multiple injections now into this knee without significant relief as well as bilateral aghast injections with some temporary relief.  She does get several weeks of relief although this is short in duration over time.  She is very active and enjoys working outside although has had a difficult time with this due to her scoliosis.  She is having an upcoming spinal surgery although they are hoping to get definitive answers with regard to her knee prior to this.  She has trialed physical therapy in the past    PMH/PSH/Family History/Social History/Meds/Allergies:    Past Medical History:  Diagnosis Date   Anxiety    Arthritis 2018   Bulging lumbar disc 2018   Complication of anesthesia    "low blood pressure after retinal detachment surgery"   Constipation    COPD (chronic obstructive pulmonary disease) (HCC)    Coronary artery disease    follows up with dr Avanell Bob prn   Degenerative disc disease, lumbar 2018   Depression    Emphysema of lung (HCC)    Family history of ovarian cancer    mother died of ovarian cancer   GERD (gastroesophageal reflux disease)    History of hiatal hernia    small   History of kidney stones    History of pneumonia    "multiple times"   History of stomach ulcers yrs ago   Hyperlipidemia    lung ca 05/2003   Multiple lung nodules    followed by dr Talmadge Fail 04-10-2020   Osteoarthritis    spine, knees, hands   PONV (postoperative nausea and vomiting)    pt "could not eat during hospital stay"   Reflux esophagitis    Scoliosis 2018   Shortness of breath    with lifting heavy things   Wears glasses    Past Surgical History:  Procedure Laterality Date   BREAST EXCISIONAL  BIOPSY Left yrs ago   BREAST EXCISIONAL BIOPSY Left    BREAST EXCISIONAL BIOPSY Right 2015   BREAST LUMPECTOMY WITH NEEDLE LOCALIZATION Right 04/11/2013   Procedure: BREAST LUMPECTOMY WITH NEEDLE LOCALIZATION;  Surgeon: Harlee Lichtenstein, MD;  Location:  [facility];  Service: General;  Laterality: Right;   BREAST SURGERY  1992   lt br bx   CHOLECYSTECTOMY  11/11/2011   Procedure: LAPAROSCOPIC CHOLECYSTECTOMY WITH INTRAOPERATIVE CHOLANGIOGRAM;  Surgeon: Harlee Lichtenstein, MD;  Location: [facility] OR;  Service: General;  Laterality: N/A;   CYSTOSCOPY  01/2009   for evaluation of hematuria   CYSTOSCOPY N/A 04/23/2020   Procedure: CYSTOSCOPY;  Surgeon: Arma Lamp, MD;  [facility]: [facility];  Service: Gynecology;  Laterality: N/A;   CYSTOSCOPY W/ URETERAL STENT PLACEMENT Left 11/16/2019   Procedure: CYSTOSCOPY WITH STENT REPLACEMENT;  Surgeon: Florencio Hunting, MD;  Location: [facility];  Service: Urology;  Laterality: Left;   DILATION AND CURETTAGE OF UTERUS  yrs ago   x 2   ESOPHAGOGASTRODUODENOSCOPY  yrs ago   EYE SURGERY  yrs ago   catataracts-both   KNEE SURGERY  yrs ago   rt knee torn cartilage   LUNG LOBECTOMY Left 05/2003   RETINAL DETACHMENT SURGERY  2011  left; x2   SINUS SURGERY WITH INSTATRAK  yrs ago   TUBAL LIGATION  yrs ago   URETEROSCOPY WITH HOLMIUM LASER LITHOTRIPSY Left 11/29/2019   Procedure: CYSTOSCOPY; LEFT URETEROSCOPY; HOLMIUM LASER LITHOTRIPSY; LEFT URETERAL STENT EXCHANGE;  Surgeon: Christina Coyer, MD;  Location: [facility];  Service: Urology;  Laterality: Left;  ONLY NEEDS 60 MIN   VAGINAL HYSTERECTOMY N/A 04/23/2020   Procedure: HYSTERECTOMY VAGINAL with bilateral salpingooopherectomy;;  Surgeon: Lillian Rein, MD;  Location: [facility];  Service: Gynecology;  Laterality: N/A;  Dr Annabell Key to perform; total time requested for all procedures is 3 hours   VAGINAL PROLAPSE REPAIR N/A 04/23/2020   Procedure: VAGINAL VAULT  SUSPENSION;  Surgeon: Arma Lamp, MD;  Location: [facility];  Service: Gynecology;  Laterality: N/A;   VIDEO BRONCHOSCOPY WITH ENDOBRONCHIAL NAVIGATION N/A 01/15/2015   Procedure: VIDEO BRONCHOSCOPY WITH ENDOBRONCHIAL NAVIGATION;  Surgeon: Zelphia Higashi, MD;  Location: [facility] OR;  Service: Thoracic;  Laterality: N/A;   VOCAL CORD LATERALIZATION, ENDOSCOPIC APPROACH W/ MLB  yrs ago   nodule removed   Social History   Socioeconomic History   Marital status: Married    Spouse name: Not on file   Number of children: 2   Years of education: Not on file   Highest education level: Not on file  Occupational History   Occupation: Retired  Tobacco Use   Smoking status: Former    Current packs/day: 0.00    Average packs/day: 2.0 packs/day for 45.6 years (91.3 ttl pk-yrs)    Types: Cigarettes    Start date: 80    Quit date: 09/21/2008    Years since quitting: 14.8   Smokeless tobacco: Never  Vaping Use   Vaping status: Never Used  Substance and Sexual Activity   Alcohol use: Yes    Alcohol/week: 0.0 - 1.0 standard drinks of alcohol    Comment: occ   Drug use: No   Sexual activity: Not Currently    Partners: Male    Birth control/protection: Post-menopausal  Other Topics Concern   Not on file  Social History Narrative   Not on file   Social Drivers of Health   Financial Resource Strain: Not on file  Food Insecurity: Not on file  Transportation Needs: Not on file  Physical Activity: Not on file  Stress: Not on file  Social Connections: Not on file   Family History  Problem Relation Age of Onset   Hypertension Mother    Diabetes Mother    Ovarian cancer Mother 87   Alcohol abuse Father    Allergies  Allergen Reactions   Adhesive [Tape] Other (See Comments)    Burned skin  TAPE WITH BETADINE  was used   Septra [Sulfamethoxazole-Trimethoprim] Hives   Tramadol Hcl     Other reaction(s): hot flashes Can take once a day   Amoxicillin Other  (See Comments)    Had a case of thrush   Current Outpatient Medications  Medication Sig Dispense Refill   amitriptyline  (ELAVIL ) 100 MG tablet Take 100 mg by mouth at bedtime.     clonazePAM (KLONOPIN) 1 MG tablet Take 1 mg by mouth at bedtime.     Ergocalciferol  50 MCG (2000 UT) CAPS 1 capsule Orally Once a day     fluticasone  (FLONASE) 50 MCG/ACT nasal spray Place 2 sprays into both nostrils daily.     fluticasone -salmeterol (WIXELA INHUB) 250-50 MCG/ACT AEPB Inhale 1 puff into the lungs in the morning and at bedtime. 60  each 11   levofloxacin  (LEVAQUIN ) 500 MG tablet Take 1 tablet (500 mg total) by mouth daily. 7 tablet 0   polyvinyl alcohol (LIQUIFILM TEARS) 1.4 % ophthalmic solution Place 1 drop into both eyes as needed for dry eyes.     rosuvastatin (CRESTOR) 5 MG tablet SMARTSIG:1 Tablet(s) By Mouth Daily on Weekdays     tiZANidine (ZANAFLEX) 4 MG capsule Take 4 mg by mouth 3 (three) times daily as needed for muscle spasms.     traMADol (ULTRAM) 50 MG tablet SMARTSIG:1 Tablet(s) By Mouth Every 12 Hours PRN     UNABLE TO FIND Vitamin d   2000 iu daily     No current facility-administered medications for this visit.   No results found.  Review of Systems:   A ROS was performed including pertinent positives and negatives as documented in the HPI.  Physical Exam :   Constitutional: NAD and appears stated age Neurological: Alert and oriented Psych: Appropriate affect and cooperative Last menstrual period 09/26/2013.   Comprehensive Musculoskeletal Exam:    Right knee with medial joint line.  There is positive crepitus.  Range of motion is from 0 to 130 degrees, negative Lachman, negative posterior drawer.  Distal neurosensory exam is intact   Imaging:   Xray (4 views right knee): Medial compartment joint space narrowing     I personally reviewed and interpreted the radiographs.   Assessment and Plan:   73 y.o. female with persistent medial joint line pain in the setting  of a previous medial meniscal debridement.  At this time she is having persistent pain and crepitus about the medial knee despite previous meniscal debridement.  At this time I would like to obtain an MRI of her right knee to assess the status of this meniscus and to discuss future planning.   - Return to clinic following right knee MRI   I personally saw and evaluated the patient, and participated in the management and treatment plan.  Wilhelmenia Harada, MD Attending Physician, Orthopedic Surgery  This document was dictated using Dragon voice recognition software. A reasonable attempt at proof reading has been made to minimize errors.

## 2023-07-17 ENCOUNTER — Ambulatory Visit
Admission: RE | Admit: 2023-07-17 | Discharge: 2023-07-17 | Disposition: A | Discharge status: Home / Self Care | Source: Ambulatory Visit | Attending: Orthopaedic Surgery | Admitting: Orthopaedic Surgery

## 2023-07-17 DIAGNOSIS — M7121 Synovial cyst of popliteal space [Baker], right knee: Secondary | ICD-10-CM | POA: Diagnosis not present

## 2023-07-17 DIAGNOSIS — M1711 Unilateral primary osteoarthritis, right knee: Secondary | ICD-10-CM | POA: Diagnosis not present

## 2023-07-17 DIAGNOSIS — G8929 Other chronic pain: Secondary | ICD-10-CM

## 2023-07-17 DIAGNOSIS — M25461 Effusion, right knee: Secondary | ICD-10-CM | POA: Diagnosis not present

## 2023-07-31 ENCOUNTER — Encounter (HOSPITAL_BASED_OUTPATIENT_CLINIC_OR_DEPARTMENT_OTHER): Payer: Self-pay

## 2023-07-31 ENCOUNTER — Ambulatory Visit (INDEPENDENT_AMBULATORY_CARE_PROVIDER_SITE_OTHER): Admitting: Orthopaedic Surgery

## 2023-07-31 ENCOUNTER — Other Ambulatory Visit (HOSPITAL_BASED_OUTPATIENT_CLINIC_OR_DEPARTMENT_OTHER): Payer: Self-pay

## 2023-07-31 ENCOUNTER — Ambulatory Visit (HOSPITAL_BASED_OUTPATIENT_CLINIC_OR_DEPARTMENT_OTHER)

## 2023-07-31 DIAGNOSIS — M25562 Pain in left knee: Secondary | ICD-10-CM

## 2023-07-31 DIAGNOSIS — M1711 Unilateral primary osteoarthritis, right knee: Secondary | ICD-10-CM

## 2023-07-31 DIAGNOSIS — M1712 Unilateral primary osteoarthritis, left knee: Secondary | ICD-10-CM | POA: Diagnosis not present

## 2023-07-31 DIAGNOSIS — G8929 Other chronic pain: Secondary | ICD-10-CM | POA: Diagnosis not present

## 2023-07-31 MED ORDER — OXYCODONE HCL 5 MG PO TABS
5.0000 mg | ORAL_TABLET | ORAL | 0 refills | Status: DC | PRN
  Filled 2023-07-31: qty 15, 3d supply, fill #0

## 2023-07-31 MED ORDER — ACETAMINOPHEN 500 MG PO TABS
500.0000 mg | ORAL_TABLET | Freq: Three times a day (TID) | ORAL | 0 refills | Status: AC
  Filled 2023-07-31: qty 30, 10d supply, fill #0

## 2023-07-31 MED ORDER — ASPIRIN 325 MG PO TBEC
325.0000 mg | DELAYED_RELEASE_TABLET | Freq: Every day | ORAL | 0 refills | Status: DC
  Filled 2023-07-31: qty 14, 14d supply, fill #0

## 2023-07-31 MED ORDER — IBUPROFEN 800 MG PO TABS
800.0000 mg | ORAL_TABLET | Freq: Three times a day (TID) | ORAL | 0 refills | Status: AC
  Filled 2023-07-31: qty 30, 10d supply, fill #0

## 2023-07-31 NOTE — Progress Notes (Signed)
 Chief Complaint: Right knee pain     History of Present Illness:   07/31/2023: Presents today for MRI discussion of the right knee.  She is also having medial based left knee pain as well.  This has been ongoing although for shorter duration in the right knee.  She did get initially good relief from her injection although this is worn off and the right knee  Tammy Eaton is a 73 y.o. female presents today with ongoing right knee pain particularly medially.  She does have a history of a medial meniscal debridement while she was in her 82s.  She has had multiple injections now into this knee without significant relief as well as bilateral aghast injections with some temporary relief.  She does get several weeks of relief although this is short in duration over time.  She is very active and enjoys working outside although has had a difficult time with this due to her scoliosis.  She is having an upcoming spinal surgery although they are hoping to get definitive answers with regard to her knee prior to this.  She has trialed physical therapy in the past    PMH/PSH/Family History/Social History/Meds/Allergies:    Past Medical History:  Diagnosis Date   Anxiety    Arthritis 2018   Bulging lumbar disc 2018   Complication of anesthesia    low blood pressure after retinal detachment surgery   Constipation    COPD (chronic obstructive pulmonary disease) (HCC)    Coronary artery disease    follows up with dr okey prn   Degenerative disc disease, lumbar 2018   Depression    Emphysema of lung (HCC)    Family history of ovarian cancer    mother died of ovarian cancer   GERD (gastroesophageal reflux disease)    History of hiatal hernia    small   History of kidney stones    History of pneumonia    multiple times   History of stomach ulcers yrs ago   Hyperlipidemia    lung ca 05/2003   Multiple lung nodules    followed by dr kerrin ronco 04-10-2020   Osteoarthritis     spine, knees, hands   PONV (postoperative nausea and vomiting)    pt could not eat during hospital stay   Reflux esophagitis    Scoliosis 2018   Shortness of breath    with lifting heavy things   Wears glasses    Past Surgical History:  Procedure Laterality Date   BREAST EXCISIONAL BIOPSY Left yrs ago   BREAST EXCISIONAL BIOPSY Left    BREAST EXCISIONAL BIOPSY Right 2015   BREAST LUMPECTOMY WITH NEEDLE LOCALIZATION Right 04/11/2013   Procedure: BREAST LUMPECTOMY WITH NEEDLE LOCALIZATION;  Surgeon: Krystal JINNY Russell, MD;  Location: [facility];  Service: General;  Laterality: Right;   BREAST SURGERY  1992   lt br bx   CHOLECYSTECTOMY  11/11/2011   Procedure: LAPAROSCOPIC CHOLECYSTECTOMY WITH INTRAOPERATIVE CHOLANGIOGRAM;  Surgeon: Krystal JINNY Russell, MD;  Location: [facility] OR;  Service: General;  Laterality: N/A;   CYSTOSCOPY  01/2009   for evaluation of hematuria   CYSTOSCOPY N/A 04/23/2020   Procedure: CYSTOSCOPY;  Surgeon: Marilynne Rosaline SAILOR, MD;  Location: [facility];  Service: Gynecology;  Laterality: N/A;   CYSTOSCOPY W/ URETERAL STENT PLACEMENT Left 11/16/2019   Procedure: CYSTOSCOPY WITH STENT REPLACEMENT;  Surgeon: Renda Glance, MD;  Location: [facility];  Service: Urology;  Laterality: Left;   DILATION AND  CURETTAGE OF UTERUS  yrs ago   x 2   ESOPHAGOGASTRODUODENOSCOPY  yrs ago   EYE SURGERY  yrs ago   catataracts-both   KNEE SURGERY  yrs ago   rt knee torn cartilage   LUNG LOBECTOMY Left 05/2003   RETINAL DETACHMENT SURGERY  2011   left; x2   SINUS SURGERY WITH INSTATRAK  yrs ago   TUBAL LIGATION  yrs ago   URETEROSCOPY WITH HOLMIUM LASER LITHOTRIPSY Left 11/29/2019   Procedure: CYSTOSCOPY; LEFT URETEROSCOPY; HOLMIUM LASER LITHOTRIPSY; LEFT URETERAL STENT EXCHANGE;  Surgeon: Nieves Cough, MD;  Location: [facility];  Service: Urology;  Laterality: Left;  ONLY NEEDS 60 MIN   VAGINAL HYSTERECTOMY N/A 04/23/2020   Procedure: HYSTERECTOMY  VAGINAL with bilateral salpingooopherectomy;;  Surgeon: Cleotilde Ronal RAMAN, MD;  Location: [facility];  Service: Gynecology;  Laterality: N/A;  Dr Cleotilde to perform; total time requested for all procedures is 3 hours   VAGINAL PROLAPSE REPAIR N/A 04/23/2020   Procedure: VAGINAL VAULT SUSPENSION;  Surgeon: Marilynne Rosaline SAILOR, MD;  Location: [facility];  Service: Gynecology;  Laterality: N/A;   VIDEO BRONCHOSCOPY WITH ENDOBRONCHIAL NAVIGATION N/A 01/15/2015   Procedure: VIDEO BRONCHOSCOPY WITH ENDOBRONCHIAL NAVIGATION;  Surgeon: Elspeth JAYSON Millers, MD;  Location: [facility];  Service: Thoracic;  Laterality: N/A;   VOCAL CORD LATERALIZATION, ENDOSCOPIC APPROACH W/ MLB  yrs ago   nodule removed   Social History   Socioeconomic History   Marital status: Married    Spouse name: Not on file   Number of children: 2   Years of education: Not on file   Highest education level: Not on file  Occupational History   Occupation: Retired  Tobacco Use   Smoking status: Former    Current packs/day: 0.00    Average packs/day: 2.0 packs/day for 45.6 years (91.3 ttl pk-yrs)    Types: Cigarettes    Start date: 22    Quit date: 09/21/2008    Years since quitting: 14.8   Smokeless tobacco: Never  Vaping Use   Vaping status: Never Used  Substance and Sexual Activity   Alcohol use: Yes    Alcohol/week: 0.0 - 1.0 standard drinks of alcohol    Comment: occ   Drug use: No   Sexual activity: Not Currently    Partners: Male    Birth control/protection: Post-menopausal  Other Topics Concern   Not on file  Social History Narrative   Not on file   Social Drivers of Health   Financial Resource Strain: Not on file  Food Insecurity: Not on file  Transportation Needs: Not on file  Physical Activity: Not on file  Stress: Not on file  Social Connections: Not on file   Family History  Problem Relation Age of Onset   Hypertension Mother    Diabetes Mother    Ovarian cancer  Mother 78   Alcohol abuse Father    Allergies  Allergen Reactions   Adhesive [Tape] Other (See Comments)    Burned skin  TAPE WITH BETADINE  was used   Septra [Sulfamethoxazole-Trimethoprim] Hives   Tramadol Hcl     Other reaction(s): hot flashes Can take once a day   Amoxicillin Other (See Comments)    Had a case of thrush   Current Outpatient Medications  Medication Sig Dispense Refill   acetaminophen  (TYLENOL ) 500 MG tablet Take 1 tablet (500 mg total) by mouth every 8 (eight) hours for 10 days. 30 tablet 0   aspirin EC 325 MG tablet  Take 1 tablet (325 mg total) by mouth daily. 14 tablet 0   ibuprofen  (ADVIL ) 800 MG tablet Take 1 tablet (800 mg total) by mouth every 8 (eight) hours for 10 days. Please take with food, please alternate with acetaminophen  30 tablet 0   oxyCODONE  (ROXICODONE ) 5 MG immediate release tablet Take 1 tablet (5 mg total) by mouth every 4 (four) hours as needed for severe pain (pain score 7-10) or breakthrough pain. 15 tablet 0   amitriptyline  (ELAVIL ) 100 MG tablet Take 100 mg by mouth at bedtime.     clonazePAM (KLONOPIN) 1 MG tablet Take 1 mg by mouth at bedtime.     Ergocalciferol  50 MCG (2000 UT) CAPS 1 capsule Orally Once a day     fluticasone  (FLONASE) 50 MCG/ACT nasal spray Place 2 sprays into both nostrils daily.     fluticasone -salmeterol (WIXELA INHUB) 250-50 MCG/ACT AEPB Inhale 1 puff into the lungs in the morning and at bedtime. 60 each 11   levofloxacin  (LEVAQUIN ) 500 MG tablet Take 1 tablet (500 mg total) by mouth daily. 7 tablet 0   polyvinyl alcohol (LIQUIFILM TEARS) 1.4 % ophthalmic solution Place 1 drop into both eyes as needed for dry eyes.     rosuvastatin (CRESTOR) 5 MG tablet SMARTSIG:1 Tablet(s) By Mouth Daily on Weekdays     tiZANidine (ZANAFLEX) 4 MG capsule Take 4 mg by mouth 3 (three) times daily as needed for muscle spasms.     traMADol (ULTRAM) 50 MG tablet SMARTSIG:1 Tablet(s) By Mouth Every 12 Hours PRN     UNABLE TO FIND  Vitamin d   2000 iu daily     No current facility-administered medications for this visit.   No results found.  Review of Systems:   A ROS was performed including pertinent positives and negatives as documented in the HPI.  Physical Exam :   Constitutional: NAD and appears stated age Neurological: Alert and oriented Psych: Appropriate affect and cooperative Last menstrual period 09/26/2013.   Comprehensive Musculoskeletal Exam:    Right knee with medial joint line.  There is positive crepitus.  Range of motion is from 0 to 130 degrees, negative Lachman, negative posterior drawer.  Distal neurosensory exam is intact   Imaging:   Xray (4 views right knee): Medial compartment joint space narrowing  MRI right knee: End-stage medial tibiofemoral osteoarthritis with preserved lateral tibiofemoral and patellofemoral joint spaces   I personally reviewed and interpreted the radiographs.   Assessment and Plan:   73 y.o. female with persistent medial joint line pain in the setting of a previous medial meniscal debridement.  At this time she is having persistent pain and crepitus about the medial knee despite previous meniscal debridement.  MRI does confirm well-preserved lateral tibiofemoral as well as patellofemoral joint spaces.  This time she has trialed and failed physical therapy as well as an injection.  Given this I did discuss the possibility of a medial patellofemoral knee arthroplasty.  I did discuss the risk limitation as well as associated recovery time after discussion she would like to proceed.  With regard to the left knee she does have early narrowing of the medial tibiofemoral joint.  I did discuss that she may ultimately be a candidate for additional workup of this.  She will defer this at this time  - Plan for right knee medial unicompartmental knee arthroplasty   After a lengthy discussion of treatment options, including risks, benefits, alternatives, complications of  surgical and nonsurgical conservative options, the patient elected  surgical repair.   The patient  is aware of the material risks  and complications including, but not limited to injury to adjacent structures, neurovascular injury, infection, numbness, bleeding, implant failure, thermal burns, stiffness, persistent pain, failure to heal, disease transmission from allograft, need for further surgery, dislocation, anesthetic risks, blood clots, risks of death,and others. The probabilities of surgical success and failure discussed with patient given their particular co-morbidities.The time and nature of expected rehabilitation and recovery was discussed.The patient's questions were all answered preoperatively.  No barriers to understanding were noted. I explained the natural history of the disease process and Rx rationale.  I explained to the patient what I considered to be reasonable expectations given their personal situation.  The final treatment plan was arrived at through a shared patient decision making process model.    I personally saw and evaluated the patient, and participated in the management and treatment plan.  Elspeth Parker, MD Attending Physician, Orthopedic Surgery  This document was dictated using Dragon voice recognition software. A reasonable attempt at proof reading has been made to minimize errors.

## 2023-10-07 DIAGNOSIS — M47816 Spondylosis without myelopathy or radiculopathy, lumbar region: Secondary | ICD-10-CM | POA: Diagnosis not present

## 2023-10-07 DIAGNOSIS — M791 Myalgia, unspecified site: Secondary | ICD-10-CM | POA: Diagnosis not present

## 2023-10-07 DIAGNOSIS — G894 Chronic pain syndrome: Secondary | ICD-10-CM | POA: Diagnosis not present

## 2023-10-07 DIAGNOSIS — M419 Scoliosis, unspecified: Secondary | ICD-10-CM | POA: Diagnosis not present

## 2023-10-07 DIAGNOSIS — M545 Low back pain, unspecified: Secondary | ICD-10-CM | POA: Diagnosis not present

## 2023-10-07 DIAGNOSIS — Z1330 Encounter for screening examination for mental health and behavioral disorders, unspecified: Secondary | ICD-10-CM | POA: Diagnosis not present

## 2023-10-07 NOTE — Progress Notes (Signed)
 This patient has a long hx COPD, left lung cancer requiring LUL lobectomy. She had an overnight oximetry test done showing her Nadir Sp02 81%. 02 at 2L/[place] was recommended by her pulmonologist but patient states she cannot afford it right now. She will need to be done at [facility] for close respiratory and 02 monitoring. April at Dr Ninette made aware.

## 2023-10-09 ENCOUNTER — Other Ambulatory Visit: Payer: Self-pay | Admitting: Orthopedic Surgery

## 2023-10-09 DIAGNOSIS — M545 Low back pain, unspecified: Secondary | ICD-10-CM

## 2023-10-09 DIAGNOSIS — M47816 Spondylosis without myelopathy or radiculopathy, lumbar region: Secondary | ICD-10-CM

## 2023-10-16 ENCOUNTER — Ambulatory Visit (HOSPITAL_BASED_OUTPATIENT_CLINIC_OR_DEPARTMENT_OTHER): Admitting: Physical Therapy

## 2023-10-21 ENCOUNTER — Encounter (HOSPITAL_BASED_OUTPATIENT_CLINIC_OR_DEPARTMENT_OTHER): Admitting: Physical Therapy

## 2023-10-22 ENCOUNTER — Ambulatory Visit (HOSPITAL_BASED_OUTPATIENT_CLINIC_OR_DEPARTMENT_OTHER): Payer: Self-pay | Admitting: Orthopaedic Surgery

## 2023-10-22 DIAGNOSIS — M1711 Unilateral primary osteoarthritis, right knee: Secondary | ICD-10-CM

## 2023-10-22 NOTE — Progress Notes (Signed)
 Surgical Instructions   Your procedure is scheduled on Tuesday October 27, 2023. Report to [facility] Main Entrance A at 1:30 P.M., then check in with the Admitting office. Any questions or running late day of surgery: call [phone]  Questions prior to your surgery date: call [phone], Monday-Friday, 8am-4pm. If you experience any cold or flu symptoms such as cough, fever, chills, shortness of breath, etc. between now and your scheduled surgery, please notify us  at the above number.     Remember:  Do not eat after midnight the night before your surgery  You may drink clear liquids until 12:30 PM the dayof your surgery.   Clear liquids allowed are: Water, Non-Citrus Juices (without pulp), Carbonated Beverages, Clear Tea (no milk, honey, etc.), Black Coffee Only (NO MILK, CREAM OR POWDERED CREAMER of any kind), and Gatorade.    Take these medicines the morning of surgery with A SIP OF WATER  acetaminophen  (TYLENOL )  fluticasone -salmeterol (WIXELA INHUB)  rosuvastatin (CRESTOR)    May take these medicines IF NEEDED: fluticasone  (FLONASE)  oxyCODONE  (ROXICODONE )  tiZANidine (ZANAFLEX)  traMADol (ULTRAM)   One week prior to surgery, STOP taking any Aspirin  (unless otherwise instructed by your surgeon) Aleve, Naproxen, Ibuprofen , Motrin , Advil , Goody's, BC's, all herbal medications, fish oil, and non-prescription vitamins.                     Do NOT Smoke (Tobacco/Vaping) for 24 hours prior to your procedure.  If you use a CPAP at night, you may bring your mask/headgear for your overnight stay.   You will be asked to remove any contacts, glasses, piercing's, hearing aid's, dentures/partials prior to surgery. Please bring cases for these items if needed.    Patients discharged the day of surgery will not be allowed to drive home, and someone needs to stay with them for 24 hours.  SURGICAL WAITING ROOM VISITATION Patients may have no more than 2 support people in the  waiting area - these visitors may rotate.   Pre-op nurse will coordinate an appropriate time for 1 ADULT support person, who may not rotate, to accompany patient in pre-op.  Children under the age of 37 must have an adult with them who is not the patient and must remain in the main waiting area with an adult.  If the patient needs to stay at the hospital during part of their recovery, the visitor guidelines for inpatient rooms apply.  Please refer to the [facility] website for the visitor guidelines for any additional information.   If you received a COVID test during your pre-op visit  it is requested that you wear a mask when out in public, stay away from anyone that may not be feeling well and notify your surgeon if you develop symptoms. If you have been in contact with anyone that has tested positive in the last 10 days please notify you surgeon.      Pre-operative 5 CHG Bathing Instructions   You can play a key role in reducing the risk of infection after surgery. Your skin needs to be as free of germs as possible. You can reduce the number of germs on your skin by washing with CHG (chlorhexidine  gluconate) soap before surgery. CHG is an antiseptic soap that kills germs and continues to kill germs even after washing.   DO NOT use if you have an allergy to chlorhexidine /CHG or antibacterial soaps. If your skin becomes reddened or irritated, stop using the CHG and notify one of  our RNs at [phone].   Please shower with the CHG soap starting 4 days before surgery using the following schedule:     Please keep in mind the following:  DO NOT shave, including legs and underarms, starting the day of your first shower.   You may shave your face at any point before/day of surgery.  Place clean sheets on your bed the day you start using CHG soap. Use a clean washcloth (not used since being washed) for each shower. DO NOT sleep with pets once you start using the CHG.   CHG Shower  Instructions:  Wash your face and private area with normal soap. If you choose to wash your hair, wash first with your normal shampoo.  After you use shampoo/soap, rinse your hair and body thoroughly to remove shampoo/soap residue.  Turn the water OFF and apply about 3 tablespoons (45 ml) of CHG soap to a CLEAN washcloth.  Apply CHG soap ONLY FROM YOUR NECK DOWN TO YOUR TOES (washing for 3-5 minutes)  DO NOT use CHG soap on face, private areas, open wounds, or sores.  Pay special attention to the area where your surgery is being performed.  If you are having back surgery, having someone wash your back for you may be helpful. Wait 2 minutes after CHG soap is applied, then you may rinse off the CHG soap.  Pat dry with a clean towel  Put on clean clothes/pajamas   If you choose to wear lotion, please use ONLY the CHG-compatible lotions that are listed below.  Additional instructions for the day of surgery: DO NOT APPLY any lotions, deodorants, cologne, or perfumes.   Do not bring valuables to the hospital. [facility] is not responsible for any belongings/valuables. Do not wear nail polish, gel polish, artificial nails, or any other type of covering on natural nails (fingers and toes) Do not wear jewelry or makeup Put on clean/comfortable clothes.  Please brush your teeth.  Ask your nurse before applying any prescription medications to the skin.     CHG Compatible Lotions   Aveeno Moisturizing lotion  Cetaphil Moisturizing Cream  Cetaphil Moisturizing Lotion  Clairol Herbal Essence Moisturizing Lotion, Dry Skin  Clairol Herbal Essence Moisturizing Lotion, Extra Dry Skin  Clairol Herbal Essence Moisturizing Lotion, Normal Skin  Curel Age Defying Therapeutic Moisturizing Lotion with Alpha Hydroxy  Curel Extreme Care Body Lotion  Curel Soothing Hands Moisturizing Hand Lotion  Curel Therapeutic Moisturizing Cream, Fragrance-Free  Curel Therapeutic Moisturizing Lotion, Fragrance-Free   Curel Therapeutic Moisturizing Lotion, Original Formula  Eucerin Daily Replenishing Lotion  Eucerin Dry Skin Therapy Plus Alpha Hydroxy Crme  Eucerin Dry Skin Therapy Plus Alpha Hydroxy Lotion  Eucerin Original Crme  Eucerin Original Lotion  Eucerin Plus Crme Eucerin Plus Lotion  Eucerin TriLipid Replenishing Lotion  Keri Anti-Bacterial Hand Lotion  Keri Deep Conditioning Original Lotion Dry Skin Formula Softly Scented  Keri Deep Conditioning Original Lotion, Fragrance Free Sensitive Skin Formula  Keri Lotion Fast Absorbing Fragrance Free Sensitive Skin Formula  Keri Lotion Fast Absorbing Softly Scented Dry Skin Formula  Keri Original Lotion  Keri Skin Renewal Lotion Keri Silky Smooth Lotion  Keri Silky Smooth Sensitive Skin Lotion  Nivea Body Creamy Conditioning Oil  Nivea Body Extra Enriched Teacher, adult education Moisturizing Lotion Nivea Crme  Nivea Skin Firming Lotion  NutraDerm 30 Skin Lotion  NutraDerm Skin Lotion  NutraDerm Therapeutic Skin Cream  NutraDerm Therapeutic Skin Lotion  ProShield Protective Hand Cream  Provon moisturizing lotion  Please read over the following fact sheets that you were given.

## 2023-10-23 ENCOUNTER — Encounter (HOSPITAL_COMMUNITY): Payer: Self-pay

## 2023-10-23 ENCOUNTER — Other Ambulatory Visit: Payer: Self-pay

## 2023-10-23 ENCOUNTER — Encounter (HOSPITAL_COMMUNITY)
Admission: RE | Admit: 2023-10-23 | Discharge: 2023-10-23 | Disposition: A | Discharge status: Home / Self Care | Source: Ambulatory Visit | Attending: Orthopaedic Surgery | Admitting: Orthopaedic Surgery

## 2023-10-23 VITALS — BP 118/61 | HR 97 | Temp 98.4°F | Resp 16 | Ht 65.5 in | Wt 138.0 lb

## 2023-10-23 DIAGNOSIS — Z87891 Personal history of nicotine dependence: Secondary | ICD-10-CM | POA: Insufficient documentation

## 2023-10-23 DIAGNOSIS — M1711 Unilateral primary osteoarthritis, right knee: Secondary | ICD-10-CM | POA: Insufficient documentation

## 2023-10-23 DIAGNOSIS — Z01810 Encounter for preprocedural cardiovascular examination: Secondary | ICD-10-CM | POA: Diagnosis present

## 2023-10-23 DIAGNOSIS — L259 Unspecified contact dermatitis, unspecified cause: Secondary | ICD-10-CM | POA: Diagnosis not present

## 2023-10-23 DIAGNOSIS — Z79899 Other long term (current) drug therapy: Secondary | ICD-10-CM | POA: Insufficient documentation

## 2023-10-23 DIAGNOSIS — J439 Emphysema, unspecified: Secondary | ICD-10-CM | POA: Diagnosis not present

## 2023-10-23 DIAGNOSIS — E785 Hyperlipidemia, unspecified: Secondary | ICD-10-CM | POA: Diagnosis not present

## 2023-10-23 DIAGNOSIS — I2510 Atherosclerotic heart disease of native coronary artery without angina pectoris: Secondary | ICD-10-CM | POA: Diagnosis not present

## 2023-10-23 DIAGNOSIS — Z01818 Encounter for other preprocedural examination: Secondary | ICD-10-CM | POA: Diagnosis not present

## 2023-10-23 DIAGNOSIS — Z9889 Other specified postprocedural states: Secondary | ICD-10-CM | POA: Diagnosis not present

## 2023-10-23 DIAGNOSIS — C3412 Malignant neoplasm of upper lobe, left bronchus or lung: Secondary | ICD-10-CM | POA: Insufficient documentation

## 2023-10-23 DIAGNOSIS — Z5971 Insufficient health insurance coverage: Secondary | ICD-10-CM | POA: Diagnosis not present

## 2023-10-23 DIAGNOSIS — Z01812 Encounter for preprocedural laboratory examination: Secondary | ICD-10-CM | POA: Diagnosis present

## 2023-10-23 DIAGNOSIS — G4736 Sleep related hypoventilation in conditions classified elsewhere: Secondary | ICD-10-CM | POA: Insufficient documentation

## 2023-10-23 LAB — SURGICAL PCR SCREEN
MRSA, PCR: NEGATIVE
Staphylococcus aureus: NEGATIVE

## 2023-10-23 LAB — CBC
HCT: 46.7 % — ABNORMAL HIGH (ref 36.0–46.0)
Hemoglobin: 14.8 g/dL (ref 12.0–15.0)
MCH: 29.2 pg (ref 26.0–34.0)
MCHC: 31.7 g/dL (ref 30.0–36.0)
MCV: 92.3 fL (ref 80.0–100.0)
Platelets: 244 K/uL (ref 150–400)
RBC: 5.06 MIL/uL (ref 3.87–5.11)
RDW: 12.9 % (ref 11.5–15.5)
WBC: 7.2 K/uL (ref 4.0–10.5)
nRBC: 0 % (ref 0.0–0.2)

## 2023-10-23 NOTE — Progress Notes (Signed)
 PCP - Lamarr Chrystal COME Cardiologist -denies Pulmonologist -  Slater Acquanetta Kassie COME  PPM/ICD - denies Device Orders -  Rep Notified -   Chest x-ray - na EKG - 10/23/23 Stress Test - 01/21/19 ECHO - 01/12/19 Cardiac Cath - denies  Sleep Study - denies CPAP - no  Fasting Blood Sugar - na Checks Blood Sugar _____ times a day  Last dose of GLP1 agonist-  na GLP1 instructions: na  Blood Thinner Instructions:na Aspirin  Instructions:na  ERAS Protcol -clear liquids until 10:30 PRE-SURGERY Ensure or G2- Ensure  COVID TEST- na   Anesthesia review: hx COPD,lung cancer,medical clearance  Patient denies shortness of breath, fever, cough and chest pain at PAT appointment   All instructions explained to the patient, with a verbal understanding of the material. Patient agrees to go over the instructions while at home for a better understanding.  The opportunity to ask questions was provided.

## 2023-10-23 NOTE — Progress Notes (Addendum)
 Surgical Instructions   Your procedure is scheduled on Tuesday October 27, 2023. Report to [facility] Main Entrance A at 11:30A.M., then check in with the Admitting office. Any questions or running late day of surgery: call [phone]  Questions prior to your surgery date: call [phone], Monday-Friday, 8am-4pm. If you experience any cold or flu symptoms such as cough, fever, chills, shortness of breath, etc. between now and your scheduled surgery, please notify us  at the above number.     Remember:  Do not eat after midnight the night before your surgery  You may drink clear liquids until 10:30 AM the dayof your surgery.   Clear liquids allowed are: Water, Non-Citrus Juices (without pulp), Carbonated Beverages, Clear Tea (no milk, honey, etc.), Black Coffee Only (NO MILK, CREAM OR POWDERED CREAMER of any kind), and Gatorade. Patient Instructions  The night before surgery:  No food after midnight. ONLY clear liquids after midnight  The day of surgery (if you do NOT have diabetes):  Drink ONE (1) Pre-Surgery Clear Ensure by 10:30 am the morning of surgery. Drink in one sitting. Do not sip.  This drink was given to you during your hospital  pre-op appointment visit.  Nothing else to drink after completing the  Pre-Surgery Clear Ensure.    Take these medicines the morning of surgery with A SIP OF WATER  acetaminophen  (TYLENOL )  fluticasone -salmeterol (WIXELA INHUB)  rosuvastatin (CRESTOR)    May take these medicines IF NEEDED: fluticasone  (FLONASE)  oxyCODONE  (ROXICODONE )  tiZANidine (ZANAFLEX)  traMADol (ULTRAM)   One week prior to surgery, STOP taking any Aspirin  (unless otherwise instructed by your surgeon) Aleve, Naproxen, Ibuprofen , Motrin , Advil , Goody's, BC's, all herbal medications, fish oil, and non-prescription vitamins.                     Do NOT Smoke (Tobacco/Vaping) for 24 hours prior to your procedure.  If you use a CPAP at night, you may bring your  mask/headgear for your overnight stay.   You will be asked to remove any contacts, glasses, piercing's, hearing aid's, dentures/partials prior to surgery. Please bring cases for these items if needed.    Patients discharged the day of surgery will not be allowed to drive home, and someone needs to stay with them for 24 hours.  SURGICAL WAITING ROOM VISITATION Patients may have no more than 2 support people in the waiting area - these visitors may rotate.   Pre-op nurse will coordinate an appropriate time for 1 ADULT support person, who may not rotate, to accompany patient in pre-op.  Children under the age of 7 must have an adult with them who is not the patient and must remain in the main waiting area with an adult.  If the patient needs to stay at the hospital during part of their recovery, the visitor guidelines for inpatient rooms apply.  Please refer to the [facility] website for the visitor guidelines for any additional information.   If you received a COVID test during your pre-op visit  it is requested that you wear a mask when out in public, stay away from anyone that may not be feeling well and notify your surgeon if you develop symptoms. If you have been in contact with anyone that has tested positive in the last 10 days please notify you surgeon.      Pre-operative 5 CHG Bathing Instructions   You can play a key role in reducing the risk of infection after surgery. Your skin  needs to be as free of germs as possible. You can reduce the number of germs on your skin by washing with CHG (chlorhexidine  gluconate) soap before surgery. CHG is an antiseptic soap that kills germs and continues to kill germs even after washing.   DO NOT use if you have an allergy to chlorhexidine /CHG or antibacterial soaps. If your skin becomes reddened or irritated, stop using the CHG and notify one of our RNs at [phone].   Please shower with the CHG soap starting 4 days before surgery using the  following schedule:     Please keep in mind the following:  DO NOT shave, including legs and underarms, starting the day of your first shower.   You may shave your face at any point before/day of surgery.  Place clean sheets on your bed the day you start using CHG soap. Use a clean washcloth (not used since being washed) for each shower. DO NOT sleep with pets once you start using the CHG.   CHG Shower Instructions:  Wash your face and private area with normal soap. If you choose to wash your hair, wash first with your normal shampoo.  After you use shampoo/soap, rinse your hair and body thoroughly to remove shampoo/soap residue.  Turn the water OFF and apply about 3 tablespoons (45 ml) of CHG soap to a CLEAN washcloth.  Apply CHG soap ONLY FROM YOUR NECK DOWN TO YOUR TOES (washing for 3-5 minutes)  DO NOT use CHG soap on face, private areas, open wounds, or sores.  Pay special attention to the area where your surgery is being performed.  If you are having back surgery, having someone wash your back for you may be helpful. Wait 2 minutes after CHG soap is applied, then you may rinse off the CHG soap.  Pat dry with a clean towel  Put on clean clothes/pajamas   If you choose to wear lotion, please use ONLY the CHG-compatible lotions that are listed below.  Additional instructions for the day of surgery: DO NOT APPLY any lotions, deodorants, cologne, or perfumes.   Do not bring valuables to the hospital. [facility] is not responsible for any belongings/valuables. Do not wear nail polish, gel polish, artificial nails, or any other type of covering on natural nails (fingers and toes) Do not wear jewelry or makeup Put on clean/comfortable clothes.  Please brush your teeth.  Ask your nurse before applying any prescription medications to the skin.     CHG Compatible Lotions   Aveeno Moisturizing lotion  Cetaphil Moisturizing Cream  Cetaphil Moisturizing Lotion  Clairol Herbal  Essence Moisturizing Lotion, Dry Skin  Clairol Herbal Essence Moisturizing Lotion, Extra Dry Skin  Clairol Herbal Essence Moisturizing Lotion, Normal Skin  Curel Age Defying Therapeutic Moisturizing Lotion with Alpha Hydroxy  Curel Extreme Care Body Lotion  Curel Soothing Hands Moisturizing Hand Lotion  Curel Therapeutic Moisturizing Cream, Fragrance-Free  Curel Therapeutic Moisturizing Lotion, Fragrance-Free  Curel Therapeutic Moisturizing Lotion, Original Formula  Eucerin Daily Replenishing Lotion  Eucerin Dry Skin Therapy Plus Alpha Hydroxy Crme  Eucerin Dry Skin Therapy Plus Alpha Hydroxy Lotion  Eucerin Original Crme  Eucerin Original Lotion  Eucerin Plus Crme Eucerin Plus Lotion  Eucerin TriLipid Replenishing Lotion  Keri Anti-Bacterial Hand Lotion  Keri Deep Conditioning Original Lotion Dry Skin Formula Softly Scented  Keri Deep Conditioning Original Lotion, Fragrance Free Sensitive Skin Formula  Keri Lotion Fast Absorbing Fragrance Free Sensitive Skin Formula  Keri Lotion Fast Absorbing Softly Scented Dry Skin Formula  Keri Original Lotion  [employer] Skin Renewal Lotion Keri Silky Smooth Lotion  Keri Silky Smooth Sensitive Skin Lotion  Nivea Body Creamy Conditioning Patent examiner Moisturizing Lotion Nivea Crme  Nivea Skin Firming Lotion  NutraDerm 30 Skin Lotion  NutraDerm Skin Lotion  NutraDerm Therapeutic Skin Cream  NutraDerm Therapeutic Skin Lotion  ProShield Protective Hand Cream  Provon moisturizing lotion  Please read over the following fact sheets that you were given.

## 2023-10-26 ENCOUNTER — Encounter (HOSPITAL_BASED_OUTPATIENT_CLINIC_OR_DEPARTMENT_OTHER): Admitting: Orthopaedic Surgery

## 2023-10-26 ENCOUNTER — Telehealth: Payer: Self-pay | Admitting: *Deleted

## 2023-10-26 NOTE — Care Plan (Signed)
 [facility] RNCM call to patient to discuss her upcoming Right unilateral knee replacement with Dr. Genelle on 10/27/23 at [facility]. She is agreeable to case management. She has a husband that will be able to assist her after discharge home. Anticipate going home same day per patient. She does not have RW, but does have crutches. Anticipate will need RW before d/c. Patient already scheduled for OPPT at [facility] at end of the week. Reviewed post op care instructions and questions answered. Will continue to follow for needs.

## 2023-10-26 NOTE — Telephone Encounter (Signed)
 [facility] pre-op call completed for patient's upcoming Right unicompartmental knee replacement.

## 2023-10-26 NOTE — Progress Notes (Signed)
 Anesthesia Chart Review:  Case: [id number] Date/Time: 10/27/23 1300   Procedure: ARTHROPLASTY, KNEE, UNICOMPARTMENTAL (Right: Knee) - RIGHT KNEE MEDIAL UNICOMPARTMENTAL ARTHROPLASTY   Anesthesia type: General   Diagnosis: Primary osteoarthritis of right knee [M17.11]   Pre-op diagnosis: RIGHT KNEE MEDIAL OSTEOARTHRITIS   [facility] ROOM 02 / [facility]   Surgeons: Genelle Standing, MD       DISCUSSION: Patient is a 73 year old female scheduled for the above procedure. Cased was moved from [facility] to [facility] Main OR due to pulmonary history, nocturnal hypoxemia (is not using nocturnal 2L/[place] due to cost).   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 She had hypotension after a retinal detachment surgery (2011).    Last pulmonology visit with Dr. Kassie was on 03/23/2023.  Patient had to switch from [place] to [place] due to insurance. Had COVID in summer 2024 and mild URI in 02/2023, but otherwise not recent COPD exacerbations. Denied limitations in activity due to her breathing. 03/18/2023 chest CT showed stable post-surgical changed from LUL resection, no recurrence of metastatic disease, stable bilateral pulmonary nodules, LLL clustered nodularity felt likely inflammatory/infectious. COPD felt overall controlled on [place]. She had previously declined pulmonary rehab. No indication for day time home O2, but overnight pulse oximetry ordered (qualified for 2L/[place] at night after 03/31/2023 study showed SpO2 <88% 4 hour 54 min 20 sec, Nadir SpO2 81%).  Reportedly she has not started yet due to the cost. Follow-up ~ 7 months planned.   At 03/24/2023 visit, CT surgeon Dr. Kerrin advised 1 year follow-up for waxing and waning pulmonary nodules.  She is not routinely followed by cardiology. Previous evaluation by Okey Moccasin, MD in 2019-2020 for RCA and LAD calcifications on CT chest imaging.  She underwent echocardiogram and nuclear stress test in 01/2019. TTE showed LVEF 55 to 60%, normal LV function, no LVH, grade 1 diastolic dysfunction, no regional wall motion abnormalities, normal RV systolic function, trivial MR/TR/PR, trivial AR, mild AV sclerosis without stenosis, normal PASP. Perfusion study was normal with LVEF 66%,  Surgical clearance note addressed to Chrystal Lamarr RAMAN, MD says, Medically optimized, both medical and cardiology note, she has COPD w/hs of lung Ca resection.  Anesthesia team to evaluate on the day of surgery.  10/23/2023 CBC from PAT and 10/06/2023 CMP from [facility] reviewed (see LABS below). Supplement O2 as indicated post-operatively.    VS: BP 118/61   Pulse 97   Temp 36.9 C   Resp 16   Ht 5' 5.5 (1.664 m)   Wt 62.6 kg   LMP 09/26/2013 (Exact Date)   SpO2 94%   BMI 22.62 kg/m    PROVIDERS: Chrystal Lamarr RAMAN, MD is PCP Kassie Slater Lou, MD is pulmonologist Kerrin Standing, MD is CT surgeon   LABS: Labs reviewed: Acceptable for surgery.  (all labs ordered are listed, but only abnormal results are displayed)  Labs Reviewed  CBC - Abnormal; Notable for the following components:      Result Value   HCT 46.7 (*)    All other components within normal limits  SURGICAL PCR SCREEN   CMP 10/06/2023 ( [facility]): Glucose 95, BUN 10, creatinine 0.73, eGFR 86,  sodium 141, potassium 4.0, calcium 9.4 ) corrected 9.06), albumin 4.4, total bilirubin 0.7, alkaline phosphatase 76, AST 17, ALT 16.  A1c 6.2% 03/27/2023.   OTHER: Overnight Oximetry Results 03/31/2023: Date: 03/31/23 SpO2 <88% 4  hour 54 min 20 sec.  Nadir SpO2 81% Qualified for oxygen Recommendations: Start 2L O2 via [place] nightly.  PFTs 04/11/2019: FVC 3.06 (93%) FEV1 1.65 (66%) Ratio 53  TLC 116% DLCO 49% Interpretation: Moderate obstructive defect with air trapping and reduced DLCO consistent with emphysema. Worsened air obstruction compared to 2016.   IMAGES: MRI Right Knee 07/17/2023: IMPRESSION: - Severe tricompartment osteoarthrosis most notably the medial compartment. There is remodeling of the femoral condyle and medial tibial plateau with full-thickness cartilage loss. Small reactive joint effusion and moderate-sized Baker's cyst. - Thickening of the ACL with intact fibers. - Fluid deep to the MCL suggesting MCL bursal collection. - Chronic complex degenerative tear of the medial meniscus versus post meniscectomy change. Clinical correlation.  CT Chest 03/20/2022: IMPRESSION: 1. Stable postsurgical changes from previous left upper lobe resection. No evidence of local recurrence or metastatic disease. 2. New clustered nodularity medially in the left lower lobe, likely inflammatory/infectious. 3. Otherwise stable bilateral pulmonary nodules as described, favoring a benign etiology. 4. Aortic Atherosclerosis (ICD10-I70.0) and Emphysema (ICD10-J43.9).   EKG: 10/23/2023: Normal sinus rhythm Possible Septal infarct , age undetermined When compared with ECG of 19-Apr-2020 13:09, No significant change since last tracing Confirmed by Lavona Agent ([id number]) on 10/23/2023 2:55:12 PM   CV: Nuclear stress test 01/21/2019: The left ventricular ejection fraction is hyperdynamic (>65%). Nuclear stress EF: 66%. There was no ST segment deviation noted during stress. The study is normal. This is a low risk study.    Echo 01/12/2019: IMPRESSIONS   1. Left ventricular ejection fraction, by visual estimation, is 55 to  60%. The left ventricle has normal function. There is no left ventricular  hypertrophy.    2. Left ventricular diastolic parameters are consistent with Grade I  diastolic dysfunction (impaired relaxation).   3. The left ventricle has no regional wall motion abnormalities.   4. Global right ventricle has normal systolic function.The right  ventricular size is normal. No increase in right ventricular wall  thickness.   5. Left atrial size was normal.   6. Right atrial size was normal.   7. The mitral valve is normal in structure. Trivial mitral valve  regurgitation. No evidence of mitral stenosis.   8. The tricuspid valve is normal in structure. Tricuspid valve  regurgitation is trivial.   9. The aortic valve is tricuspid. Aortic valve regurgitation is trivial.  Mild aortic valve sclerosis without stenosis.  10. The pulmonic valve was normal in structure. Pulmonic valve  regurgitation is trivial.  11. Normal pulmonary artery systolic pressure.  12. The inferior vena cava is normal in size with greater than 50%  respiratory variability, suggesting right atrial pressure of 3 mmHg.    Past Medical History:  Diagnosis Date   Anxiety    Arthritis 2018   Bulging lumbar disc 2018   Complication of anesthesia    low blood pressure after retinal detachment surgery   Constipation    COPD (chronic obstructive pulmonary disease) (HCC)    Coronary artery disease    follows up with dr okey prn   Degenerative disc disease, lumbar 2018   Depression    Emphysema of lung (HCC)    Family history of ovarian cancer    mother died of ovarian cancer   GERD (gastroesophageal  reflux disease)    History of hiatal hernia    small   History of kidney stones    History of pneumonia    multiple times   History of stomach ulcers yrs ago   Hyperlipidemia    lung ca 05/2003   Multiple lung nodules    followed by dr kerrin ronco 04-10-2020   Osteoarthritis    spine, knees, hands   PONV (postoperative nausea and vomiting)    pt could not eat during hospital stay   Reflux esophagitis     Scoliosis 2018   Shortness of breath    with lifting heavy things   Wears glasses     Past Surgical History:  Procedure Laterality Date   BREAST EXCISIONAL BIOPSY Left yrs ago   BREAST EXCISIONAL BIOPSY Left    BREAST EXCISIONAL BIOPSY Right 2015   BREAST LUMPECTOMY WITH NEEDLE LOCALIZATION Right 04/11/2013   Procedure: BREAST LUMPECTOMY WITH NEEDLE LOCALIZATION;  Surgeon: Krystal JINNY Russell, MD;  Location: [facility];  Service: General;  Laterality: Right;   BREAST SURGERY  1992   lt br bx   CHOLECYSTECTOMY  11/11/2011   Procedure: LAPAROSCOPIC CHOLECYSTECTOMY WITH INTRAOPERATIVE CHOLANGIOGRAM;  Surgeon: Krystal JINNY Russell, MD;  Location: [facility] OR;  Service: General;  Laterality: N/A;   CYSTOSCOPY  01/2009   for evaluation of hematuria   CYSTOSCOPY N/A 04/23/2020   Procedure: CYSTOSCOPY;  Surgeon: Marilynne Rosaline SAILOR, MD;  Location: [facility];  Service: Gynecology;  Laterality: N/A;   CYSTOSCOPY W/ URETERAL STENT PLACEMENT Left 11/16/2019   Procedure: CYSTOSCOPY WITH STENT REPLACEMENT;  Surgeon: Renda Glance, MD;  Location: [facility];  Service: Urology;  Laterality: Left;   DILATION AND CURETTAGE OF UTERUS  yrs ago   x 2   ESOPHAGOGASTRODUODENOSCOPY  yrs ago   EYE SURGERY  yrs ago   catataracts-both   KNEE SURGERY  yrs ago   rt knee torn cartilage   LUNG LOBECTOMY Left 05/2003   RETINAL DETACHMENT SURGERY  2011   left; x2   SINUS SURGERY WITH INSTATRAK  yrs ago   TUBAL LIGATION  yrs ago   URETEROSCOPY WITH HOLMIUM LASER LITHOTRIPSY Left 11/29/2019   Procedure: CYSTOSCOPY; LEFT URETEROSCOPY; HOLMIUM LASER LITHOTRIPSY; LEFT URETERAL STENT EXCHANGE;  Surgeon: Nieves Cough, MD;  Location: [facility];  Service: Urology;  Laterality: Left;  ONLY NEEDS 60 MIN   VAGINAL HYSTERECTOMY N/A 04/23/2020   Procedure: HYSTERECTOMY VAGINAL with bilateral salpingooopherectomy;;  Surgeon: Cleotilde Ronal RAMAN, MD;  Location: [facility];  Service:  Gynecology;  Laterality: N/A;  Dr Cleotilde to perform; total time requested for all procedures is 3 hours   VAGINAL PROLAPSE REPAIR N/A 04/23/2020   Procedure: VAGINAL VAULT SUSPENSION;  Surgeon: Marilynne Rosaline SAILOR, MD;  Location: [facility];  Service: Gynecology;  Laterality: N/A;   VIDEO BRONCHOSCOPY WITH ENDOBRONCHIAL NAVIGATION N/A 01/15/2015   Procedure: VIDEO BRONCHOSCOPY WITH ENDOBRONCHIAL NAVIGATION;  Surgeon: Elspeth JAYSON kerrin, MD;  Location: [facility] OR;  Service: Thoracic;  Laterality: N/A;   VOCAL CORD LATERALIZATION, ENDOSCOPIC APPROACH W/ MLB  yrs ago   nodule removed    MEDICATIONS:  acetaminophen  (TYLENOL ) 500 MG tablet   amitriptyline  (ELAVIL ) 100 MG tablet   aspirin  EC 325 MG tablet   Cholecalciferol (VITAMIN D3 PO)   clonazePAM (KLONOPIN) 1 MG tablet   fluticasone  (FLONASE) 50 MCG/ACT nasal spray   fluticasone -salmeterol (WIXELA INHUB) 250-50 MCG/ACT AEPB   ibuprofen  (ADVIL ) 800 MG tablet   Multiple Vitamin (MULTIVITAMIN  WITH MINERALS) TABS tablet   oxyCODONE  (ROXICODONE ) 5 MG immediate release tablet   Psyllium (METAMUCIL 3 IN 1 DAILY FIBER PO)   rosuvastatin (CRESTOR) 5 MG tablet   tiZANidine (ZANAFLEX) 4 MG tablet   traMADol  (ULTRAM ) 50 MG tablet   No current facility-administered medications for this encounter.    Isaiah Ruder, PA-C Surgical Short Stay/Anesthesiology [facility] Phone [phone] [facility] Phone [phone] 10/26/2023 12:03 PM

## 2023-10-26 NOTE — Anesthesia Preprocedure Evaluation (Signed)
 Anesthesia Evaluation  Patient identified by MRN, date of birth, ID band Patient awake    Reviewed: Allergy & Precautions, NPO status , Patient's Chart, lab work & pertinent test results  History of Anesthesia Complications (+) PONV and history of anesthetic complications  Airway Mallampati: III  TM Distance: >3 FB Neck ROM: Full   Comment: Previous grade I view with [facility] 2 Dental  (+) Dental Advisory Given   Pulmonary neg shortness of breath, neg sleep apnea, COPD,  COPD inhaler, neg recent URI, former smoker Multiple lung nodules, h/o lung cancer   Pulmonary exam normal breath sounds clear to auscultation       Cardiovascular (-) hypertension(-) angina + CAD  (-) Past MI, (-) Cardiac Stents and (-) CABG (-) dysrhythmias  Rhythm:Regular Rate:Normal  HLD  Low-risk stress test 01/21/2019  TTE 01/12/2019: IMPRESSIONS    1. Left ventricular ejection fraction, by visual estimation, is 55 to  60%. The left ventricle has normal function. There is no left ventricular  hypertrophy.   2. Left ventricular diastolic parameters are consistent with Grade I  diastolic dysfunction (impaired relaxation).   3. The left ventricle has no regional wall motion abnormalities.   4. Global right ventricle has normal systolic function.The right  ventricular size is normal. No increase in right ventricular wall  thickness.   5. Left atrial size was normal.   6. Right atrial size was normal.   7. The mitral valve is normal in structure. Trivial mitral valve  regurgitation. No evidence of mitral stenosis.   8. The tricuspid valve is normal in structure. Tricuspid valve  regurgitation is trivial.   9. The aortic valve is tricuspid. Aortic valve regurgitation is trivial.  Mild aortic valve sclerosis without stenosis.  10. The pulmonic valve was normal in structure. Pulmonic valve  regurgitation is trivial.  11. Normal pulmonary artery systolic  pressure.  12. The inferior vena cava is normal in size with greater than 50%  respiratory variability, suggesting right atrial pressure of 3 mmHg.     Neuro/Psych  PSYCHIATRIC DISORDERS Anxiety Depression    negative neurological ROS     GI/Hepatic Neg liver ROS, hiatal hernia, PUD,GERD  ,,  Endo/Other  negative endocrine ROS    Renal/GU Renal disease (stone)     Musculoskeletal  (+) Arthritis , Osteoarthritis,  Scoliosis    Abdominal   Peds  Hematology negative hematology ROS (+) Lab Results      Component                Value               Date                      WBC                      7.2                 10/23/2023                HGB                      14.8                10/23/2023                HCT  46.7 (H)            10/23/2023                MCV                      92.3                10/23/2023                PLT                      244                 10/23/2023              Anesthesia Other Findings   Reproductive/Obstetrics                              Anesthesia Physical Anesthesia Plan  ASA: 3  Anesthesia Plan: MAC and Spinal   Post-op Pain Management: Regional block* and Tylenol  PO (pre-op)*   Induction: Intravenous  PONV Risk Score and Plan: 3 and Ondansetron , Dexamethasone , Propofol  infusion, TIVA and Treatment may vary due to age or medical condition  Airway Management Planned: Natural Airway and Simple Face Mask  Additional Equipment:   Intra-op Plan:   Post-operative Plan:   Informed Consent: I have reviewed the patients History and Physical, chart, labs and discussed the procedure including the risks, benefits and alternatives for the proposed anesthesia with the patient or authorized representative who has indicated his/her understanding and acceptance.     Dental advisory given  Plan Discussed with: CRNA and Anesthesiologist  Anesthesia Plan Comments: (PAT note written  10/26/2023 by Theophil Thivierge, PA-C.  Discussed potential risks of nerve blocks including, but not limited to, infection, bleeding, nerve damage, seizures, pneumothorax, respiratory depression, and potential failure of the block. Alternatives to nerve blocks discussed. All questions answered.  I have discussed risks of neuraxial anesthesia including but not limited to infection, bleeding, nerve injury, back pain, headache, seizures, and failure of block. Patient denies bleeding disorders and is not currently anticoagulated. Labs have been reviewed. Risks and benefits discussed. All patient's questions answered.    )         Anesthesia Quick Evaluation

## 2023-10-27 ENCOUNTER — Encounter (HOSPITAL_COMMUNITY): Payer: Self-pay | Admitting: Orthopaedic Surgery

## 2023-10-27 ENCOUNTER — Ambulatory Visit (HOSPITAL_COMMUNITY): Payer: Self-pay | Admitting: Vascular Surgery

## 2023-10-27 ENCOUNTER — Encounter (HOSPITAL_COMMUNITY)
Admission: RE | Disposition: A | Discharge status: Home / Self Care | Payer: Self-pay | Source: Home / Self Care | Attending: Orthopaedic Surgery

## 2023-10-27 ENCOUNTER — Ambulatory Visit (HOSPITAL_COMMUNITY)

## 2023-10-27 ENCOUNTER — Ambulatory Visit (HOSPITAL_COMMUNITY): Admitting: Anesthesiology

## 2023-10-27 ENCOUNTER — Other Ambulatory Visit: Payer: Self-pay

## 2023-10-27 ENCOUNTER — Ambulatory Visit: Admit: 2023-10-27 | Admitting: Orthopaedic Surgery

## 2023-10-27 ENCOUNTER — Ambulatory Visit (HOSPITAL_COMMUNITY)
Admission: RE | Admit: 2023-10-27 | Discharge: 2023-10-27 | Disposition: A | Discharge status: Home / Self Care | Attending: Orthopaedic Surgery | Admitting: Orthopaedic Surgery

## 2023-10-27 DIAGNOSIS — E785 Hyperlipidemia, unspecified: Secondary | ICD-10-CM | POA: Insufficient documentation

## 2023-10-27 DIAGNOSIS — J449 Chronic obstructive pulmonary disease, unspecified: Secondary | ICD-10-CM

## 2023-10-27 DIAGNOSIS — Z87891 Personal history of nicotine dependence: Secondary | ICD-10-CM | POA: Insufficient documentation

## 2023-10-27 DIAGNOSIS — Z471 Aftercare following joint replacement surgery: Secondary | ICD-10-CM | POA: Diagnosis not present

## 2023-10-27 DIAGNOSIS — Z85118 Personal history of other malignant neoplasm of bronchus and lung: Secondary | ICD-10-CM | POA: Insufficient documentation

## 2023-10-27 DIAGNOSIS — G8918 Other acute postprocedural pain: Secondary | ICD-10-CM | POA: Diagnosis not present

## 2023-10-27 DIAGNOSIS — M419 Scoliosis, unspecified: Secondary | ICD-10-CM | POA: Diagnosis not present

## 2023-10-27 DIAGNOSIS — Z96651 Presence of right artificial knee joint: Secondary | ICD-10-CM | POA: Diagnosis not present

## 2023-10-27 DIAGNOSIS — F418 Other specified anxiety disorders: Secondary | ICD-10-CM | POA: Diagnosis not present

## 2023-10-27 DIAGNOSIS — I2510 Atherosclerotic heart disease of native coronary artery without angina pectoris: Secondary | ICD-10-CM | POA: Insufficient documentation

## 2023-10-27 DIAGNOSIS — M1711 Unilateral primary osteoarthritis, right knee: Secondary | ICD-10-CM

## 2023-10-27 HISTORY — PX: PARTIAL KNEE ARTHROPLASTY: SHX2174

## 2023-10-27 SURGERY — ARTHROPLASTY, KNEE, UNICOMPARTMENTAL
Anesthesia: General | Site: Knee | Laterality: Right

## 2023-10-27 SURGERY — ARTHROPLASTY, KNEE, UNICOMPARTMENTAL
Anesthesia: Monitor Anesthesia Care | Site: Knee | Laterality: Right

## 2023-10-27 MED ORDER — PHENYLEPHRINE HCL (PRESSORS) 10 MG/ML IV SOLN
INTRAVENOUS | Status: DC | PRN
  Administered 2023-10-27: 80 ug via INTRAVENOUS

## 2023-10-27 MED ORDER — PROPOFOL 10 MG/ML IV BOLUS
INTRAVENOUS | Status: AC
  Filled 2023-10-27: qty 20

## 2023-10-27 MED ORDER — MIDAZOLAM HCL 2 MG/2ML IJ SOLN
INTRAMUSCULAR | Status: AC
  Filled 2023-10-27: qty 2

## 2023-10-27 MED ORDER — ACETAMINOPHEN 500 MG PO TABS
1000.0000 mg | ORAL_TABLET | Freq: Once | ORAL | Status: AC
  Administered 2023-10-27: 1000 mg via ORAL
  Filled 2023-10-27: qty 2

## 2023-10-27 MED ORDER — VANCOMYCIN HCL 1000 MG IV SOLR
INTRAVENOUS | Status: AC
  Filled 2023-10-27: qty 20

## 2023-10-27 MED ORDER — STERILE WATER FOR IRRIGATION IR SOLN
Status: DC | PRN
  Administered 2023-10-27: 1000 mL

## 2023-10-27 MED ORDER — CEFAZOLIN SODIUM-DEXTROSE 2-4 GM/100ML-% IV SOLN
2.0000 g | INTRAVENOUS | Status: AC
  Administered 2023-10-27: 2 g via INTRAVENOUS
  Filled 2023-10-27: qty 100

## 2023-10-27 MED ORDER — FENTANYL CITRATE (PF) 100 MCG/2ML IJ SOLN: 50.0000 ug | Freq: Once | INTRAMUSCULAR | Status: AC

## 2023-10-27 MED ORDER — ORAL CARE MOUTH RINSE: 15.0000 mL | Freq: Once | OROMUCOSAL | Status: AC

## 2023-10-27 MED ORDER — SODIUM CHLORIDE 0.9 % IR SOLN
Status: DC | PRN
  Administered 2023-10-27: 1000 mL

## 2023-10-27 MED ORDER — KETOROLAC TROMETHAMINE 30 MG/ML IJ SOLN
INTRAMUSCULAR | Status: AC
  Filled 2023-10-27: qty 1

## 2023-10-27 MED ORDER — VANCOMYCIN HCL 1000 MG IV SOLR
INTRAVENOUS | Status: DC | PRN
  Administered 2023-10-27: 1000 mg via TOPICAL

## 2023-10-27 MED ORDER — ROPIVACAINE HCL 5 MG/ML IJ SOLN
INTRAMUSCULAR | Status: AC
  Filled 2023-10-27: qty 30

## 2023-10-27 MED ORDER — GABAPENTIN 300 MG PO CAPS
300.0000 mg | ORAL_CAPSULE | Freq: Once | ORAL | Status: DC
  Filled 2023-10-27: qty 1

## 2023-10-27 MED ORDER — PROPOFOL 10 MG/ML IV BOLUS
INTRAVENOUS | Status: DC | PRN
  Administered 2023-10-27 (×2): 30 mg via INTRAVENOUS

## 2023-10-27 MED ORDER — EPINEPHRINE PF 1 MG/ML IJ SOLN
INTRAMUSCULAR | Status: AC
  Filled 2023-10-27: qty 1

## 2023-10-27 MED ORDER — ROPIVACAINE HCL 5 MG/ML IJ SOLN
INTRAMUSCULAR | Status: DC | PRN
  Administered 2023-10-27: 20 mL via PERINEURAL

## 2023-10-27 MED ORDER — 0.9 % SODIUM CHLORIDE (POUR BTL) OPTIME
TOPICAL | Status: DC | PRN
  Administered 2023-10-27: 1000 mL

## 2023-10-27 MED ORDER — PROPOFOL 500 MG/50ML IV EMUL
INTRAVENOUS | Status: DC | PRN
  Administered 2023-10-27: 100 ug/kg/min via INTRAVENOUS

## 2023-10-27 MED ORDER — SODIUM CHLORIDE (PF) 0.9 % IJ SOLN
INTRAMUSCULAR | Status: DC | PRN
  Administered 2023-10-27: 71.3 mL via PERIARTICULAR

## 2023-10-27 MED ORDER — ONDANSETRON HCL 4 MG/2ML IJ SOLN
INTRAMUSCULAR | Status: DC | PRN
  Administered 2023-10-27: 4 mg via INTRAVENOUS

## 2023-10-27 MED ORDER — FENTANYL CITRATE (PF) 250 MCG/5ML IJ SOLN
INTRAMUSCULAR | Status: AC
  Filled 2023-10-27: qty 5

## 2023-10-27 MED ORDER — LACTATED RINGERS IV SOLN
INTRAVENOUS | Status: DC
Start: 2023-10-27 — End: 2023-10-27

## 2023-10-27 MED ORDER — CHLORHEXIDINE GLUCONATE 0.12 % MT SOLN
15.0000 mL | Freq: Once | OROMUCOSAL | Status: AC
  Administered 2023-10-27: 15 mL via OROMUCOSAL
  Filled 2023-10-27: qty 15

## 2023-10-27 MED ORDER — FENTANYL CITRATE (PF) 100 MCG/2ML IJ SOLN
INTRAMUSCULAR | Status: AC
  Administered 2023-10-27: 50 ug via INTRAVENOUS
  Filled 2023-10-27: qty 2

## 2023-10-27 MED ORDER — TRANEXAMIC ACID-NACL 1000-0.7 MG/100ML-% IV SOLN
1000.0000 mg | INTRAVENOUS | Status: AC
  Administered 2023-10-27: 1000 mg via INTRAVENOUS
  Filled 2023-10-27: qty 100

## 2023-10-27 MED ORDER — PHENYLEPHRINE HCL-NACL 20-0.9 MG/250ML-% IV SOLN
INTRAVENOUS | Status: DC | PRN
  Administered 2023-10-27: 30 ug/min via INTRAVENOUS

## 2023-10-27 SURGICAL SUPPLY — 69 items
BAG BILE T-TUBES STRL (MISCELLANEOUS) ×1 IMPLANT
BAG COUNTER SPONGE SURGICOUNT (BAG) ×1 IMPLANT
BANDAGE ESMARK 6X9 LF (GAUZE/BANDAGES/DRESSINGS) ×1 IMPLANT
BLADE SAW RECIP 77.5X11 (BLADE) ×1 IMPLANT
BLADE SAW RECIP 87.9 MT (BLADE) IMPLANT
BLADE SAW SAG 90X13X1.27 (BLADE) ×1 IMPLANT
BNDG ELASTIC 6INX 5YD STR LF (GAUZE/BANDAGES/DRESSINGS) ×2 IMPLANT
BOWL SMART MIX CTS (DISPOSABLE) ×1 IMPLANT
CEMENT BONE R 1X40 (Cement) IMPLANT
COMP FEM PRSN CEM SZ 3 RT (Orthopedic Implant) IMPLANT
COOLER ICEMAN CLASSIC (MISCELLANEOUS) IMPLANT
COVER SURGICAL LIGHT HANDLE (MISCELLANEOUS) ×1 IMPLANT
CUFF TOURN SGL QUICK 42 (TOURNIQUET CUFF) IMPLANT
CUFF TRNQT CYL 34X4.125X (TOURNIQUET CUFF) ×1 IMPLANT
DRAPE EXTREMITY T 121X128X90 (DISPOSABLE) ×1 IMPLANT
DRAPE HALF SHEET 40X57 (DRAPES) IMPLANT
DRAPE INCISE IOBAN 66X45 STRL (DRAPES) ×1 IMPLANT
DRAPE SURG ORHT 6 SPLT 77X108 (DRAPES) ×2 IMPLANT
DRAPE U-SHAPE 47X51 STRL (DRAPES) ×1 IMPLANT
DRSG XEROFORM 1X8 (GAUZE/BANDAGES/DRESSINGS) IMPLANT
DURAPREP 26ML APPLICATOR (WOUND CARE) ×1 IMPLANT
ELECT CAUTERY BLADE 6.4 (BLADE) ×1 IMPLANT
ELECTRODE REM PT RTRN 9FT ADLT (ELECTROSURGICAL) ×1 IMPLANT
FACESHIELD WRAPAROUND OR TEAM (MASK) ×3 IMPLANT
GAUZE PAD ABD 8X10 STRL (GAUZE/BANDAGES/DRESSINGS) ×1 IMPLANT
GAUZE SPONGE 4X4 12PLY STRL (GAUZE/BANDAGES/DRESSINGS) ×1 IMPLANT
GAUZE XEROFORM 1X8 LF (GAUZE/BANDAGES/DRESSINGS) ×1 IMPLANT
GLOVE BIOGEL PI IND STRL 8 (GLOVE) ×2 IMPLANT
GLOVE ORTHO TXT STRL SZ7.5 (GLOVE) ×1 IMPLANT
GLOVE SURG ORTHO 8.0 STRL STRW (GLOVE) ×1 IMPLANT
GOWN STRL REUS W/ TWL LRG LVL3 (GOWN DISPOSABLE) ×2 IMPLANT
GOWN STRL REUS W/ TWL XL LVL3 (GOWN DISPOSABLE) ×2 IMPLANT
IMMOBILIZER KNEE 22 UNIV (SOFTGOODS) ×1 IMPLANT
INSERT TIB BEAR CMT PS RM F (Insert) IMPLANT
INSERT TIB BEAR KNEE F 8 RT (Insert) IMPLANT
INSERTER TIP PARTIAL KNEE (MISCELLANEOUS) IMPLANT
KIT BASIN OR (CUSTOM PROCEDURE TRAY) ×1 IMPLANT
KIT TURNOVER KIT B (KITS) ×1 IMPLANT
LEGGING LITHOTOMY PAIR STRL (DRAPES) ×1 IMPLANT
MANIFOLD NEPTUNE II (INSTRUMENTS) ×1 IMPLANT
NDL 18GX1X1/2 (RX/OR ONLY) (NEEDLE) ×1 IMPLANT
NDL FILTER BLUNT 18X1 1/2 (NEEDLE) IMPLANT
NEEDLE 18GX1X1/2 (RX/OR ONLY) (NEEDLE) ×1 IMPLANT
NEEDLE FILTER BLUNT 18X1 1/2 (NEEDLE) ×1 IMPLANT
NS IRRIG 1000ML POUR BTL (IV SOLUTION) ×1 IMPLANT
PACK TOTAL JOINT (CUSTOM PROCEDURE TRAY) ×1 IMPLANT
PAD ARMBOARD POSITIONER FOAM (MISCELLANEOUS) ×1 IMPLANT
PAD COLD SHLDR WRAP-ON (PAD) IMPLANT
PADDING CAST COTTON 6X4 STRL (CAST SUPPLIES) ×1 IMPLANT
PIN DRILL HDLS TROCAR 75 4PK (PIN) IMPLANT
SCREW HEADED 33MM KNEE (MISCELLANEOUS) IMPLANT
SCREW HEADED 48MM KNEE (MISCELLANEOUS) IMPLANT
SET HNDPC FAN SPRY TIP SCT (DISPOSABLE) ×1 IMPLANT
SET PAD KNEE POSITIONER (MISCELLANEOUS) ×1 IMPLANT
STAPLER SKIN PROX 35W (STAPLE) IMPLANT
STRIP CLOSURE SKIN 1/2X4 (GAUZE/BANDAGES/DRESSINGS) ×1 IMPLANT
SUCTION TUBE FRAZIER 10FR DISP (SUCTIONS) ×1 IMPLANT
SUT MNCRL AB 4-0 PS2 18 (SUTURE) ×1 IMPLANT
SUT VIC AB 0 CT1 27XBRD ANBCTR (SUTURE) ×1 IMPLANT
SUT VIC AB 1 CT1 27XBRD ANBCTR (SUTURE) ×1 IMPLANT
SUT VIC AB 2-0 CT1 TAPERPNT 27 (SUTURE) ×1 IMPLANT
SYR 30ML LL (SYRINGE) IMPLANT
SYR 50ML LL SCALE MARK (SYRINGE) ×1 IMPLANT
TOWEL GREEN STERILE (TOWEL DISPOSABLE) ×1 IMPLANT
TOWEL GREEN STERILE FF (TOWEL DISPOSABLE) ×1 IMPLANT
TRAY CATH INTERMITTENT SS 16FR (CATHETERS) IMPLANT
TRAY FOLEY MTR SLVR 16FR STAT (SET/KITS/TRAYS/PACK) IMPLANT
WATER STERILE IRR 1000ML POUR (IV SOLUTION) ×2 IMPLANT
WRAP KNEE MAXI GEL POST OP (GAUZE/BANDAGES/DRESSINGS) ×1 IMPLANT

## 2023-10-27 NOTE — Discharge Instructions (Signed)
 Discharge Instructions    Attending Surgeon: Elspeth Parker, MD Office Phone Number: [phone]   Diagnosis and Procedures:    Surgeries Performed: Right knee medial unicompartmental knee arthroplasty  Discharge Plan:    Diet: Resume usual diet. Begin with light or bland foods.  Drink plenty of fluids.  Activity:  Weight bearing as tolerated right leg. You are advised to go home directly from the hospital or surgical center. Restrict your activities.  GENERAL INSTRUCTIONS: 1.  Please apply ice to your wound to help with swelling and inflammation. This will improve your comfort and your overall recovery following surgery.     2. Please call Dr. Danetta office at [phone] with questions Monday-Friday during business hours. If no one answers, please leave a message and someone should get back to the patient within 24 hours. For emergencies please call 911 or proceed to the emergency room.   3. Patient to notify surgical team if experiences any of the following: Bowel/Bladder dysfunction, uncontrolled pain, nerve/muscle weakness, incision with increased drainage or redness, nausea/vomiting and Fever greater than 101.0 F.  Be alert for signs of infection including redness, streaking, odor, fever or chills. Be alert for excessive pain or bleeding and notify your surgeon immediately.  WOUND INSTRUCTIONS:   Leave your dressing, cast, or splint in place until your post operative visit.  Keep it clean and dry.  Always keep the incision clean and dry until the staples/sutures are removed. If there is no drainage from the incision you should keep it open to air. If there is drainage from the incision you must keep it covered at all times until the drainage stops  Do not soak in a bath tub, hot tub, pool, lake or other body of water  until 21 days after your surgery and your incision is completely dry and healed.  If you have removable sutures (or staples) they must be removed  10-14 days (unless otherwise instructed) from the day of your surgery.     1)  Elevate the extremity as much as possible.  2)  Keep the dressing clean and dry.  3)  Please call us  if the dressing becomes wet or dirty.  4)  If you are experiencing worsening pain or worsening swelling, please call.     MEDICATIONS: Resume all previous home medications at the previous prescribed dose and frequency unless otherwise noted Start taking the  pain medications on an as-needed basis as prescribed  Please taper down pain medication over the next week following surgery.  Ideally you should not require a refill of any narcotic pain medication.  Take pain medication with food to minimize nausea. In addition to the prescribed pain medication, you may take over-the-counter pain relievers such as Tylenol .  Do NOT take additional tylenol  if your pain medication already has tylenol  in it.  Aspirin  325mg  daily per instructions on bottle. Narcotic policy: Per [facility] clinic policy, our goal is ensure optimal postoperative pain control with a multimodal pain management strategy. For all [facility] patients, our goal is to wean post-operative narcotic medications by 6 weeks post-operatively, and many times sooner. If this is not possible due to utilization of pain medication prior to surgery, your [facility] doctor will support your acute post-operative pain control for the first 6 weeks postoperatively, with a plan to transition you back to your primary pain team following that. Maralee will work to ensure a Therapist, occupational.       FOLLOWUP INSTRUCTIONS: 1. Follow up at the  Physical Therapy Clinic 3-4 days following surgery. This appointment should be scheduled unless other arrangements have been made.The Physical Therapy scheduling number is [phone] if an appointment has not already been arranged.  2. Contact Dr. Danetta office during office hours at [phone] or the practice after hours line at  [phone] for non-emergencies. For medical emergencies call 911.   Discharge Location: Home

## 2023-10-27 NOTE — H&P (Signed)
 Expand All Collapse All       Chief Complaint: Right knee pain        History of Present Illness:    07/31/2023: Presents today for MRI discussion of the right knee.  She is also having medial based left knee pain as well.  This has been ongoing although for shorter duration in the right knee.  She did get initially good relief from her injection although this is worn off and the right knee   Tammy Eaton is a 73 y.o. female presents today with ongoing right knee pain particularly medially.  She does have a history of a medial meniscal debridement while she was in her 43s.  She has had multiple injections now into this knee without significant relief as well as bilateral aghast injections with some temporary relief.  She does get several weeks of relief although this is short in duration over time.  She is very active and enjoys working outside although has had a difficult time with this due to her scoliosis.  She is having an upcoming spinal surgery although they are hoping to get definitive answers with regard to her knee prior to this.  She has trialed physical therapy in the past       PMH/PSH/Family History/Social History/Meds/Allergies:         Past Medical History:  Diagnosis Date   Anxiety     Arthritis 2018   Bulging lumbar disc 2018   Complication of anesthesia      low blood pressure after retinal detachment surgery   Constipation     COPD (chronic obstructive pulmonary disease) (HCC)     Coronary artery disease      follows up with dr okey prn   Degenerative disc disease, lumbar 2018   Depression     Emphysema of lung (HCC)     Family history of ovarian cancer      mother died of ovarian cancer   GERD (gastroesophageal reflux disease)     History of hiatal hernia      small   History of kidney stones     History of pneumonia      multiple times   History of stomach ulcers yrs ago   Hyperlipidemia     lung ca 05/2003   Multiple lung nodules       followed by dr kerrin ronco 04-10-2020   Osteoarthritis      spine, knees, hands   PONV (postoperative nausea and vomiting)      pt could not eat during hospital stay   Reflux esophagitis     Scoliosis 2018   Shortness of breath      with lifting heavy things   Wears glasses               Past Surgical History:  Procedure Laterality Date   BREAST EXCISIONAL BIOPSY Left yrs ago   BREAST EXCISIONAL BIOPSY Left     BREAST EXCISIONAL BIOPSY Right 2015   BREAST LUMPECTOMY WITH NEEDLE LOCALIZATION Right 04/11/2013    Procedure: BREAST LUMPECTOMY WITH NEEDLE LOCALIZATION;  Surgeon: Krystal JINNY Russell, MD;  Location: [facility];  Service: General;  Laterality: Right;   BREAST SURGERY   1992    lt br bx   CHOLECYSTECTOMY   11/11/2011    Procedure: LAPAROSCOPIC CHOLECYSTECTOMY WITH INTRAOPERATIVE CHOLANGIOGRAM;  Surgeon: Krystal JINNY Russell, MD;  Location: [facility] OR;  Service: General;  Laterality: N/A;   CYSTOSCOPY   01/2009  for evaluation of hematuria   CYSTOSCOPY N/A 04/23/2020    Procedure: CYSTOSCOPY;  Surgeon: Marilynne Rosaline SAILOR, MD;  Location: [facility];  Service: Gynecology;  Laterality: N/A;   CYSTOSCOPY W/ URETERAL STENT PLACEMENT Left 11/16/2019    Procedure: CYSTOSCOPY WITH STENT REPLACEMENT;  Surgeon: Renda Glance, MD;  Location: [facility];  Service: Urology;  Laterality: Left;   DILATION AND CURETTAGE OF UTERUS   yrs ago    x 2   ESOPHAGOGASTRODUODENOSCOPY   yrs ago   EYE SURGERY   yrs ago    catataracts-both   KNEE SURGERY   yrs ago    rt knee torn cartilage   LUNG LOBECTOMY Left 05/2003   RETINAL DETACHMENT SURGERY   2011    left; x2   SINUS SURGERY WITH INSTATRAK   yrs ago   TUBAL LIGATION   yrs ago   URETEROSCOPY WITH HOLMIUM LASER LITHOTRIPSY Left 11/29/2019    Procedure: CYSTOSCOPY; LEFT URETEROSCOPY; HOLMIUM LASER LITHOTRIPSY; LEFT URETERAL STENT EXCHANGE;  Surgeon: Nieves Cough, MD;  Location: [facility];  Service: Urology;   Laterality: Left;  ONLY NEEDS 60 MIN   VAGINAL HYSTERECTOMY N/A 04/23/2020    Procedure: HYSTERECTOMY VAGINAL with bilateral salpingooopherectomy;;  Surgeon: Cleotilde Ronal RAMAN, MD;  Location: [facility];  Service: Gynecology;  Laterality: N/A;  Dr Cleotilde to perform; total time requested for all procedures is 3 hours   VAGINAL PROLAPSE REPAIR N/A 04/23/2020    Procedure: VAGINAL VAULT SUSPENSION;  Surgeon: Marilynne Rosaline SAILOR, MD;  Location: [facility];  Service: Gynecology;  Laterality: N/A;   VIDEO BRONCHOSCOPY WITH ENDOBRONCHIAL NAVIGATION N/A 01/15/2015    Procedure: VIDEO BRONCHOSCOPY WITH ENDOBRONCHIAL NAVIGATION;  Surgeon: Elspeth JAYSON Millers, MD;  Location: [facility] OR;  Service: Thoracic;  Laterality: N/A;   VOCAL CORD LATERALIZATION, ENDOSCOPIC APPROACH W/ MLB   yrs ago    nodule removed        Social History         Socioeconomic History   Marital status: Married      Spouse name: Not on file   Number of children: 2   Years of education: Not on file   Highest education level: Not on file  Occupational History   Occupation: Retired  Tobacco Use   Smoking status: Former      Current packs/day: 0.00      Average packs/day: 2.0 packs/day for 45.6 years (91.3 ttl pk-yrs)      Types: Cigarettes      Start date: 71      Quit date: 09/21/2008      Years since quitting: 14.8   Smokeless tobacco: Never  Vaping Use   Vaping status: Never Used  Substance and Sexual Activity   Alcohol use: Yes      Alcohol/week: 0.0 - 1.0 standard drinks of alcohol      Comment: occ   Drug use: No   Sexual activity: Not Currently      Partners: Male      Birth control/protection: Post-menopausal  Other Topics Concern   Not on file  Social History Narrative   Not on file    Social Drivers of Health    Financial Resource Strain: Not on file  Food Insecurity: Not on file  Transportation Needs: Not on file  Physical Activity: Not on file  Stress: Not on file   Social Connections: Not on file         Family History  Problem Relation Age  of Onset   Hypertension Mother     Diabetes Mother     Ovarian cancer Mother 37   Alcohol abuse Father          Allergies       Allergies  Allergen Reactions   Adhesive [Tape] Other (See Comments)      Burned skin   TAPE WITH BETADINE  was used   Septra [Sulfamethoxazole-Trimethoprim] Hives   Tramadol  Hcl        Other reaction(s): hot flashes Can take once a day   Amoxicillin Other (See Comments)      Had a case of thrush            Current Outpatient Medications  Medication Sig Dispense Refill   acetaminophen  (TYLENOL ) 500 MG tablet Take 1 tablet (500 mg total) by mouth every 8 (eight) hours for 10 days. 30 tablet 0   aspirin  EC 325 MG tablet Take 1 tablet (325 mg total) by mouth daily. 14 tablet 0   ibuprofen  (ADVIL ) 800 MG tablet Take 1 tablet (800 mg total) by mouth every 8 (eight) hours for 10 days. Please take with food, please alternate with acetaminophen  30 tablet 0   oxyCODONE  (ROXICODONE ) 5 MG immediate release tablet Take 1 tablet (5 mg total) by mouth every 4 (four) hours as needed for severe pain (pain score 7-10) or breakthrough pain. 15 tablet 0   amitriptyline  (ELAVIL ) 100 MG tablet Take 100 mg by mouth at bedtime.       clonazePAM (KLONOPIN) 1 MG tablet Take 1 mg by mouth at bedtime.       Ergocalciferol  50 MCG (2000 UT) CAPS 1 capsule Orally Once a day       fluticasone  (FLONASE) 50 MCG/ACT nasal spray Place 2 sprays into both nostrils daily.       fluticasone -salmeterol (WIXELA INHUB) 250-50 MCG/ACT AEPB Inhale 1 puff into the lungs in the morning and at bedtime. 60 each 11   levofloxacin  (LEVAQUIN ) 500 MG tablet Take 1 tablet (500 mg total) by mouth daily. 7 tablet 0   polyvinyl alcohol (LIQUIFILM TEARS) 1.4 % ophthalmic solution Place 1 drop into both eyes as needed for dry eyes.       rosuvastatin (CRESTOR) 5 MG tablet SMARTSIG:1 Tablet(s) By Mouth Daily on Weekdays        tiZANidine (ZANAFLEX) 4 MG capsule Take 4 mg by mouth 3 (three) times daily as needed for muscle spasms.       traMADol  (ULTRAM ) 50 MG tablet SMARTSIG:1 Tablet(s) By Mouth Every 12 Hours PRN       UNABLE TO FIND Vitamin d   2000 iu daily          No current facility-administered medications for this visit.      Imaging Results (Last 48 hours)  No results found.     Review of Systems:   A ROS was performed including pertinent positives and negatives as documented in the HPI.   Physical Exam :   Constitutional: NAD and appears stated age Neurological: Alert and oriented Psych: Appropriate affect and cooperative Last menstrual period 09/26/2013.    Comprehensive Musculoskeletal Exam:     Right knee with medial joint line.  There is positive crepitus.  Range of motion is from 0 to 130 degrees, negative Lachman, negative posterior drawer.  Distal neurosensory exam is intact     Imaging:   Xray (4 views right knee): Medial compartment joint space narrowing   MRI right knee: End-stage medial tibiofemoral osteoarthritis with  preserved lateral tibiofemoral and patellofemoral joint spaces     I personally reviewed and interpreted the radiographs.     Assessment and Plan:   73 y.o. female with persistent medial joint line pain in the setting of a previous medial meniscal debridement.  At this time she is having persistent pain and crepitus about the medial knee despite previous meniscal debridement.  MRI does confirm well-preserved lateral tibiofemoral as well as patellofemoral joint spaces.  This time she has trialed and failed physical therapy as well as an injection.  Given this I did discuss the possibility of a medial patellofemoral knee arthroplasty.  I did discuss the risk limitation as well as associated recovery time after discussion she would like to proceed.  With regard to the left knee she does have early narrowing of the medial tibiofemoral joint.  I did discuss that she may  ultimately be a candidate for additional workup of this.  She will defer this at this time   - Plan for right knee medial unicompartmental knee arthroplasty     After a lengthy discussion of treatment options, including risks, benefits, alternatives, complications of surgical and nonsurgical conservative options, the patient elected surgical repair.    The patient  is aware of the material risks  and complications including, but not limited to injury to adjacent structures, neurovascular injury, infection, numbness, bleeding, implant failure, thermal burns, stiffness, persistent pain, failure to heal, disease transmission from allograft, need for further surgery, dislocation, anesthetic risks, blood clots, risks of death,and others. The probabilities of surgical success and failure discussed with patient given their particular co-morbidities.The time and nature of expected rehabilitation and recovery was discussed.The patient's questions were all answered preoperatively.  No barriers to understanding were noted. I explained the natural history of the disease process and Rx rationale.  I explained to the patient what I considered to be reasonable expectations given their personal situation.  The final treatment plan was arrived at through a shared patient decision making process model.       I personally saw and evaluated the patient, and participated in the management and treatment plan.   Elspeth Parker, MD Attending Physician, Orthopedic Surgery   This document was dictated using Dragon voice recognition software. A reasonable attempt at proof reading has been made to minimize errors.

## 2023-10-27 NOTE — Anesthesia Procedure Notes (Signed)
 Anesthesia Regional Block: Adductor canal block   Pre-Anesthetic Checklist: , timeout performed,  Correct Patient, Correct Site, Correct Laterality,  Correct Procedure, Correct Position, site marked,  Risks and benefits discussed,  Surgical consent,  Pre-op evaluation,  At surgeon's request and post-op pain management  Laterality: Right  Prep: chloraprep       Needles:  Injection technique: Single-shot  Needle Type: Echogenic Stimulator Needle     Needle Length: 9cm  Needle Gauge: 21     Additional Needles:   Procedures:,,,, ultrasound used (permanent image in chart),,    Narrative:  Start time: 10/27/2023 12:09 PM End time: 10/27/2023 12:12 PM Injection made incrementally with aspirations every 5 mL.  Performed by: Personally  Anesthesiologist: Peggye Delon Brunswick, MD  Additional Notes: Discussed risks and benefits of nerve block including, but not limited to, prolonged and/or permanent nerve injury involving sensory and/or motor function. Monitors were applied and a time-out was performed. The nerve and associated structures were visualized under ultrasound guidance. After negative aspiration, local anesthetic was slowly injected around the nerve. There was no evidence of high pressure during the procedure. There were no paresthesias. VSS remained stable and the patient tolerated the procedure well.

## 2023-10-27 NOTE — Anesthesia Procedure Notes (Addendum)
 Spinal  Patient location during procedure: OR Start time: 10/27/2023 12:47 PM End time: 10/27/2023 12:53 PM Reason for block: surgical anesthesia Staffing Performed: anesthesiologist  Anesthesiologist: Peggye Delon Brunswick, MD Performed by: Peggye Delon Brunswick, MD Authorized by: Peggye Delon Brunswick, MD   Preanesthetic Checklist Completed: patient identified, IV checked, site marked, risks and benefits discussed, surgical consent, monitors and equipment checked, pre-op evaluation and timeout performed Spinal Block Patient position: sitting Prep: DuraPrep Patient monitoring: blood pressure and continuous pulse ox Approach: left paramedian Location: L3-4 Injection technique: single-shot Needle Needle type: Pencan  Needle gauge: 24 G Needle length: 9 cm Additional Notes Risks and benefits of neuraxial anesthesia including, but not limited to, infection, bleeding, local anesthetic toxicity, headache, hypotension, back pain, block failure, etc. were discussed with the patient. The patient expressed understanding and consented to the procedure. I confirmed that the patient has no bleeding disorders and is not taking blood thinners. I confirmed the patient's last platelet count with the nurse. Monitors were applied. A time-out was performed immediately prior to the procedure. Sterile technique was used throughout the whole procedure.   3 attempt(s)

## 2023-10-27 NOTE — Op Note (Signed)
 Date of Surgery: 10/27/2023  INDICATIONS: Tammy Eaton is a 73 y.o.-year-old female with right knee medial unicompartmental osteoarthritis.  The risk and benefits of the procedure were discussed in detail and documented in the pre-operative evaluation.   PREOPERATIVE DIAGNOSIS: 1. Right knee medial unicompartmental osteoarthritis  POSTOPERATIVE DIAGNOSIS: Same.  PROCEDURE: 1. Right knee medial unicompartmental knee arthroplasty  SURGEON: Elspeth LITTIE Parker MD  ASSISTANT: Conley Dawson, ATC  ANESTHESIA:  general  IV FLUIDS AND URINE: See anesthesia record.  ANTIBIOTICS: Ancef   ESTIMATED BLOOD LOSS: 25 mL.  IMPLANTS:  Implant Name Type Inv. Item Serial No. Manufacturer Lot No. LRB No. Used Action  CEMENT BONE R 1X40 - [id number] Cement CEMENT BONE R 1X40  ZIMMER RECON(ORTH,TRAU,BIO,SG) [id number] Right 1 Implanted  INSERT TIB BEAR CMT PS RM F - [id number] Insert INSERT TIB BEAR CMT PS RM F  ZIMMER [employer]ON(ORTH,TRAU,BIO,SG) [id number] Right 1 Implanted  COMP FEM PRSN CEM SZ 3 RT - [id number] Orthopedic Implant COMP FEM PRSN CEM SZ 3 RT  ZIMMER RECON(ORTH,TRAU,BIO,SG) [id number] Right 1 Implanted  INSERT TIB BEAR KNEE F 8 RT - [id number] Insert INSERT TIB BEAR KNEE F 8 RT  ZIMMER [employer]ON(ORTH,TRAU,BIO,SG) [id number] Right 1 Implanted    DRAINS: None  CULTURES: None  COMPLICATIONS: none  DESCRIPTION OF PROCEDURE:   The patient was identified in the pre-operative holding area, marked, and consent completed.  Anesthesia was then performed with regional nerve block.  The patient was transferred to the operative suite and placed in the supine position with all bony prominences padded. SCDs were placed on the non-operative lower extremity. Appropriate antibiotics was administered within 1 hour before incision. The patient underwent general anesthesia with the anesthesia team. The right lower extremity was then prepped and draped in standard fashion.A time out was performed confirming the correct  extremity, correct patient and correct procedures.   An anterior midline incision was marked and made just medial to the midline from just above the patella down to the tibial tubercle. Full-thickness flaps were raised and hemostasis maintained using electrocautery. We then performed a medial parapatellar arthrotomy taking care to preserve the parts of the joint that would not be altered, including ligaments and patellofemoral joint. We excised the fat pad and performed a medial peel on the tibia, noting grade IV changes on the femoral condyle and tibia. Otherwise the patella and remaining articular cartilage demonstrated minimal degenerative wear and both the ACL and PCL were functioning and intact. The medial meniscus was resected at this point.  We then turned our attention to the tibia, placing the extramedullary reference guide in the appropriate position and adjusted it so that the appropriate amount of bone was resected at an angle recreating native slope. We then performed the cuts with a sagittal saw vertically at the medial spine, preserving ACL and PCL, followed by the transverse cut with the oscillating saw. Once we were happy with the cut we checked the gap sizing with a size 8mm block in flexion and extension and noted it to be appropriate.  We then turned our attention to the femur, setting up the distal femoral resection guide on the spacing block in extension, securing it with pins. The distal femoral cut was cut performed while protecting the notch and skin with retractors. Lastly the knee was flexed and the posterior resection guide was secured with pins and the cut was performed. We then used osteotomes and rongeurs to complete and finish all cuts. We then re-checked our gap balancing and noted it to  be appropriate once more. We then sized the distal femur and then secured this with pins and cut the posterior chamfer and drilling the lugs.We next sized the tibia using sizing rings and then  trialed all components with the 8mm insert. The knee was brought though its range of motion and found to be satisfactory in flexion/extension. We then removed the trials and inserted the guide to finalize the tibia with drill and keel, this was performed without complication.  We then irrigated all bony surfaces with pulse suction irrigator and dried them, and inspected cuts and soft tissues, noting that all were appropriate and ready for implantation. We then proceeded with mixing cement and placing a 4x8 sponge at the posterior aspect of the knee, held in place by a Z-retractor to prevent excess cement from passing in that direction.Once the cement was appropriately tacky we placed the appropriate amount on the tibial implant and placed the implant in the tibia, keeping it compressed and noting that it was sitting in the same position as previously trialed. Excess cement was removed and we irrigated and dried the femoral bone and mixed a new batch of cement. Once it was appropriately tacky we applied it to the femoral component and inserted the component, removed excess cement, and then inserted the polyethylene insert and extended the knee fully, compressing the knee while the cement hardened. We then inspected the knee and irrigated it, removing all excess cement and ranging it fully. We were happy with the motion of the knee, flexing to >130 and stable throughout. We then closed the arthrotomy with 0-vicryl, followed by 2-0 vicryl buried stitches at the skin and then skin staples. We cleaned and dried the wound, applied sterile dressings and let down the tourniquet.All counts were correct at the end of the case.The patient awoke from anesthesia without difficulty and was transferred to the PACU in stable condition.     POSTOPERATIVE PLAN: She will be weight bearing as tolerated. She will be placed on aspirin  for 2 weeks for blood clot prevention. She will follow the partial knee arthroplasty  protocol.  Elspeth LITTIE Parker, MD 2:21 PM

## 2023-10-27 NOTE — Brief Op Note (Signed)
   Brief Op Note  Date of Surgery: 10/27/2023  Preoperative Diagnosis: RIGHT KNEE MEDIAL OSTEOARTHRITIS  Postoperative Diagnosis: same  Procedure: Procedure(s): ARTHROPLASTY, KNEE, UNICOMPARTMENTAL  Implants: Implant Name Type Inv. Item Serial No. Manufacturer Lot No. LRB No. Used Action  CEMENT BONE R 1X40 - [id number] Cement CEMENT BONE R 1X40  ZIMMER RECON(ORTH,TRAU,BIO,SG) [id number] Right 1 Implanted  INSERT TIB BEAR CMT PS RM F - [id number] Insert INSERT TIB BEAR CMT PS RM F  ZIMMER [employer]ON(ORTH,TRAU,BIO,SG) [id number] Right 1 Implanted  COMP FEM PRSN CEM SZ 3 RT - [id number] Orthopedic Implant COMP FEM PRSN CEM SZ 3 RT  ZIMMER RECON(ORTH,TRAU,BIO,SG) [id number] Right 1 Implanted  INSERT TIB BEAR KNEE F 8 RT - [id number] Insert INSERT TIB BEAR KNEE F 8 RT  ZIMMER RECON(ORTH,TRAU,BIO,SG) [id number] Right 1 Implanted    Surgeons: Surgeon(s): Genelle Standing, MD  Anesthesia: Spinal    Estimated Blood Loss: See anesthesia record  Complications: None  Condition to PACU: Stable  Standing LITTIE Genelle, MD 10/27/2023 2:21 PM

## 2023-10-27 NOTE — Transfer of Care (Signed)
 Immediate Anesthesia Transfer of Care Note  Patient: Paiten Boies  Procedure(s) Performed: ARTHROPLASTY, KNEE, UNICOMPARTMENTAL (Right: Knee)  Patient Location: PACU  Anesthesia Type:Spinal  Level of Consciousness: awake, alert , and oriented  Airway & Oxygen Therapy: Patient Spontanous Breathing  Post-op Assessment: Report given to RN and Post -op Vital signs reviewed and stable  Post vital signs: Reviewed and stable  Last Vitals:  Vitals Value Taken Time  BP    Temp    Pulse 76 10/27/23 14:42  Resp 14 10/27/23 14:42  SpO2 96 % 10/27/23 14:42  Vitals shown include unfiled device data.  Last Pain:  Vitals:   10/27/23 1211  PainSc: 0-No pain         Complications: No notable events documented.

## 2023-10-28 ENCOUNTER — Encounter (HOSPITAL_COMMUNITY): Payer: Self-pay | Admitting: Orthopaedic Surgery

## 2023-10-28 ENCOUNTER — Encounter (HOSPITAL_BASED_OUTPATIENT_CLINIC_OR_DEPARTMENT_OTHER): Payer: Self-pay | Admitting: Orthopaedic Surgery

## 2023-10-29 ENCOUNTER — Encounter (HOSPITAL_COMMUNITY): Payer: Self-pay | Admitting: Orthopaedic Surgery

## 2023-10-29 ENCOUNTER — Telehealth: Payer: Self-pay | Admitting: *Deleted

## 2023-10-29 NOTE — Telephone Encounter (Signed)
 Spoke to patient this afternoon and she is definitely having increased pain and a POD#2 kind of day. She is icing, elevating and all the things. She reports the pain medication Isn't cutting it. Asked about a non-narcotic pain medication that she has heard about on the news. I think I know what she is talking about, but I have told her I have never had any of our surgeons use this and don't know if it'd be appropriate at this time. She comes to [employer]. Any recommendations on her pain medication? Thank you.

## 2023-10-29 NOTE — Anesthesia Postprocedure Evaluation (Signed)
 Anesthesia Post Note  Patient: Tammy Eaton  Procedure(s) Performed: ARTHROPLASTY, KNEE, UNICOMPARTMENTAL (Right: Knee)     Patient location during evaluation: PACU Anesthesia Type: MAC and Spinal Level of consciousness: awake and alert Pain management: pain level controlled Vital Signs Assessment: post-procedure vital signs reviewed and stable Respiratory status: spontaneous breathing, nonlabored ventilation and respiratory function stable Cardiovascular status: blood pressure returned to baseline and stable Postop Assessment: no apparent nausea or vomiting Anesthetic complications: no   No notable events documented.                 Mckenzee Beem

## 2023-10-30 ENCOUNTER — Encounter (HOSPITAL_BASED_OUTPATIENT_CLINIC_OR_DEPARTMENT_OTHER): Payer: Self-pay | Admitting: Physical Therapy

## 2023-10-30 ENCOUNTER — Ambulatory Visit (HOSPITAL_BASED_OUTPATIENT_CLINIC_OR_DEPARTMENT_OTHER): Attending: Orthopaedic Surgery | Admitting: Physical Therapy

## 2023-10-30 ENCOUNTER — Other Ambulatory Visit: Payer: Self-pay

## 2023-10-30 ENCOUNTER — Other Ambulatory Visit (HOSPITAL_BASED_OUTPATIENT_CLINIC_OR_DEPARTMENT_OTHER): Payer: Self-pay | Admitting: Orthopaedic Surgery

## 2023-10-30 DIAGNOSIS — M1711 Unilateral primary osteoarthritis, right knee: Secondary | ICD-10-CM | POA: Insufficient documentation

## 2023-10-30 DIAGNOSIS — M25561 Pain in right knee: Secondary | ICD-10-CM | POA: Diagnosis not present

## 2023-10-30 DIAGNOSIS — R29898 Other symptoms and signs involving the musculoskeletal system: Secondary | ICD-10-CM | POA: Insufficient documentation

## 2023-10-30 DIAGNOSIS — R2689 Other abnormalities of gait and mobility: Secondary | ICD-10-CM | POA: Insufficient documentation

## 2023-10-30 MED ORDER — TRAMADOL HCL 50 MG PO TABS: 50.0000 mg | ORAL_TABLET | Freq: Four times a day (QID) | ORAL | 0 refills | Status: DC | PRN

## 2023-10-30 NOTE — Therapy (Addendum)
 OUTPATIENT PHYSICAL THERAPY LOWER EXTREMITY EVALUATION   Patient Name: Tammy Eaton MRN: [id number] DOB:07-21-1950, 73 y.o., female Today's Date: 10/30/2023  END OF SESSION:  PT End of Session - 10/30/23 1408     Visit Number 1    Number of Visits 24    Date for Recertification  01/22/24    Authorization Type [employer]    Progress Note Due on Visit 10    PT Start Time 1345    PT Stop Time 1426    PT Time Calculation (min) 41 min    Activity Tolerance Patient tolerated treatment well    Behavior During Therapy [facility] for tasks assessed/performed          Past Medical History:  Diagnosis Date   Anxiety    Arthritis 2018   Bulging lumbar disc 2018   Complication of anesthesia    low blood pressure after retinal detachment surgery   Constipation    COPD (chronic obstructive pulmonary disease) (HCC)    Coronary artery disease    follows up with dr okey prn   Degenerative disc disease, lumbar 2018   Depression    Emphysema of lung (HCC)    Family history of ovarian cancer    mother died of ovarian cancer   GERD (gastroesophageal reflux disease)    History of hiatal hernia    small   History of kidney stones    History of pneumonia    multiple times   History of stomach ulcers yrs ago   Hyperlipidemia    lung ca 05/2003   Multiple lung nodules    followed by dr kerrin ronco 04-10-2020   Osteoarthritis    spine, knees, hands   PONV (postoperative nausea and vomiting)    pt could not eat during hospital stay   Reflux esophagitis    Scoliosis 2018   Shortness of breath    with lifting heavy things   Wears glasses    Past Surgical History:  Procedure Laterality Date   BREAST EXCISIONAL BIOPSY Left yrs ago   BREAST EXCISIONAL BIOPSY Left    BREAST EXCISIONAL BIOPSY Right 2015   BREAST LUMPECTOMY WITH NEEDLE LOCALIZATION Right 04/11/2013   Procedure: BREAST LUMPECTOMY WITH NEEDLE LOCALIZATION;  Surgeon: Krystal JINNY Russell, MD;  Location: [facility];  Service: General;  Laterality: Right;   BREAST SURGERY  1992   lt br bx   CHOLECYSTECTOMY  11/11/2011   Procedure: LAPAROSCOPIC CHOLECYSTECTOMY WITH INTRAOPERATIVE CHOLANGIOGRAM;  Surgeon: Krystal JINNY Russell, MD;  Location[facility];  Service: General;  Laterality: N/A;   CYSTOSCOPY  01/2009   for evaluation of hematuria   CYSTOSCOPY N/A 04/23/2020   Procedure: CYSTOSCOPY;  Surgeon: Marilynne Rosaline SAILOR, MD;  Location: [facility];  Service: Gynecology;  Laterality: N/A;   CYSTOSCOPY W/ URETERAL STENT PLACEMENT Left 11/16/2019   Procedure: CYSTOSCOPY WITH STENT REPLACEMENT;  Surgeon: Renda Glance, MD;  Location: [facility];  Service: Urology;  Laterality: Left;   DILATION AND CURETTAGE OF UTERUS  yrs ago   x 2   ESOPHAGOGASTRODUODENOSCOPY  yrs ago   EYE SURGERY  yrs ago   catataracts-both   KNEE SURGERY  yrs ago   rt knee torn cartilage   LUNG LOBECTOMY Left 05/2003   PARTIAL KNEE ARTHROPLASTY Right 10/27/2023   Procedure: ARTHROPLASTY, KNEE, UNICOMPARTMENTAL;  Surgeon: Genelle Standing, MD;  Location: [facility];  Service: Orthopedics;  Laterality: Right;  RIGHT KNEE MEDIAL UNICOMPARTMENTAL ARTHROPLASTY   RETINAL DETACHMENT SURGERY  2011   left; x2   SINUS SURGERY WITH INSTATRAK  yrs ago   TUBAL LIGATION  yrs ago   URETEROSCOPY WITH HOLMIUM LASER LITHOTRIPSY Left 11/29/2019   Procedure: CYSTOSCOPY; LEFT URETEROSCOPY; HOLMIUM LASER LITHOTRIPSY; LEFT URETERAL STENT EXCHANGE;  Surgeon: Nieves Cough, MD;  Location: [facility];  Service: Urology;  Laterality: Left;  ONLY NEEDS 60 MIN   VAGINAL HYSTERECTOMY N/A 04/23/2020   Procedure: HYSTERECTOMY VAGINAL with bilateral salpingooopherectomy;;  Surgeon: Cleotilde Ronal RAMAN, MD;  Location: [facility];  Service: Gynecology;  Laterality: N/A;  Dr Cleotilde to perform; total time requested for all procedures is 3 hours   VAGINAL PROLAPSE REPAIR N/A 04/23/2020   Procedure: VAGINAL VAULT SUSPENSION;  Surgeon: Marilynne Rosaline SAILOR, MD;  Location: [facility];  Service: Gynecology;  Laterality: N/A;   VIDEO BRONCHOSCOPY WITH ENDOBRONCHIAL NAVIGATION N/A 01/15/2015   Procedure: VIDEO BRONCHOSCOPY WITH ENDOBRONCHIAL NAVIGATION;  Surgeon: Elspeth JAYSON Millers, MD;  Location: [facility];  Service: Thoracic;  Laterality: N/A;   VOCAL CORD LATERALIZATION, ENDOSCOPIC APPROACH W/ MLB  yrs ago   nodule removed   Patient Active Problem List   Diagnosis Date Noted   Unilateral primary osteoarthritis, right knee 10/27/2023   Hardening of the aorta (main artery of the heart) 02/09/2020   Gastro-esophageal reflux disease without esophagitis 02/09/2020   Insomnia 02/09/2020   Major depression in complete remission 02/09/2020   Moderate COPD (chronic obstructive pulmonary disease) (HCC) 12/25/2014   Nodule of right lung 12/11/2014   Papilloma of right breast 03/23/2013   Primary cancer of left upper lobe of lung (HCC) 11/27/2011   Symptomatic cholelithiasis 09/22/2011    PCP: Chrystal Lamarr RAMAN, MD  REFERRING PROVIDER: Genelle Elspeth, MD  REFERRING DIAG: M17.11 (ICD-10-CM) - Unilateral primary osteoarthritis, right knee  THERAPY DIAG:  Right knee pain, unspecified chronicity  Other abnormalities of gait and mobility  Other symptoms and signs involving the musculoskeletal system  Rationale for Evaluation and Treatment: Rehabilitation  ONSET DATE: DOS 10/20/23  SUBJECTIVE:   SUBJECTIVE STATEMENT: Right medial unicompartmental knee arthroplasty. pt reports that she is feeling good today. Yesterday was not good. Did two loads of laundry yesterday, took her meds, and laid down with her cooing machine. Reports that her scoliosis doctor told her she needed to get her knee done before having back surgery. May have to have left knee done in another year.   PERTINENT HISTORY: Bulging lumbar disc (2018), COPD, CAD, DDD Lumbar (2018), Emphysema, OA (spine, knees, hands), scoliosis (2018), SOB with heavy  lifting  PAIN:  Are you having pain? Yes: NPRS scale: 1/10, 101 yesterday during the day  Pain location: Right knee Pain description:   Aggravating factors: Moving Relieving factors: Ice  PRECAUTIONS: None  RED FLAGS: None   WEIGHT BEARING RESTRICTIONS: No  FALLS:  Has patient fallen in last 6 months? No and    OCCUPATION: Retired   PLOF: Independent  PATIENT GOALS: Doing stuff around the house like decorating   NEXT MD VISIT: 11/06/23  OBJECTIVE:  Note: Objective measures were completed at Evaluation unless otherwise noted.  PATIENT SURVEYS:  LEFS  Extreme difficulty/unable (0), Quite a bit of difficulty (1), Moderate difficulty (2), Little difficulty (3), No difficulty (4) Survey date:  Eval 9/25  Any of your usual work, housework or school activities 2  2. Usual hobbies, recreational or sporting activities 2  3. Getting into/out of the bath 2  4. Walking between rooms 2  5. Putting on socks/shoes 0  6. Squatting  0  7. Lifting an object, like a bag of groceries from the floor 4  8. Performing light activities around your home 4  9. Performing heavy activities around your home 1  10. Getting into/out of a car 2  11. Walking 2 blocks 2  12. Walking 1 mile 0  13. Going up/down 10 stairs (1 flight) 0  14. Standing for 1 hour 0  15.  sitting for 1 hour 4  16. Running on even ground 0  17. Running on uneven ground 0  18. Making sharp turns while running fast 0  19. Hopping  0  20. Rolling over in bed 4  Score total:  29     COGNITION: Overall cognitive status: Within functional limits for tasks assessed     SENSATION: WFL  POSTURE: rounded shoulders, forward head, increased lumbar lordosis, and increased thoracic kyphosis   LOWER EXTREMITY ROM:  Active ROM (AAROM) Right eval Left eval  Hip flexion    Hip extension    Hip abduction    Hip adduction    Hip internal rotation    Hip external rotation    Knee flexion 89   Knee extension Lacking 2    Ankle dorsiflexion    Ankle plantarflexion    Ankle inversion    Ankle eversion     (Blank rows = not tested)  LOWER EXTREMITY MMT: good quad set, extensor lag with SLR; not fully assessed due to post op status  MMT Right eval Left eval  Hip flexion    Hip extension    Hip abduction    Hip adduction    Hip internal rotation    Hip external rotation    Knee flexion    Knee extension    Ankle dorsiflexion    Ankle plantarflexion    Ankle inversion    Ankle eversion     (Blank rows = not tested)                                                         TREATMENT DATE:   10/30/23 Bandage change  Eval and HEP Towel slides x10  SLR x10 SAQ x10 Quad sets x10  PATIENT EDUCATION:  Education details: HEP, activity tolerance, and pain management.  Person educated: Patient Education method: Explanation Education comprehension: verbalized understanding  HOME EXERCISE PROGRAM: Access Code: [email or web address] URL: [email or web address]  Date: 10/30/2023  Prepared by: Prentice Zaunegger  Exercises - Seated Heel Slide  - 2 x daily - 7 x weekly - 2 sets - 10 reps - Long Sitting Quad Set  - 5 x daily - 7 x weekly - 2 sets - 10 reps - 10 second hold - Supine Heel Slide with Strap  - 5 x daily - 7 x weekly - 10 reps - 10 second hold - Active Straight Leg Raise with Quad Set  - 5 x daily - 7 x weekly - 2 sets - 10 reps - Long Sitting Calf Stretch with Strap  - 1 x daily - 7 x weekly - 3 reps - 20 second hold - Seated Ankle Pumps  - 5 x daily - 7 x weekly - 20 reps     ASSESSMENT:  CLINICAL IMPRESSION: Patient is a 73 y.o. female who was seen today for physical therapy evaluation  and treatment s/p right unicompartmental knee arthroplasty on 10/20/23. Patient demonstrates deficits in knee range of motion, strength, activity tolerance, and pain. HEP was given and patient was educated on expected soreness and pain management. Patient will benefit from physical therapy to address current  limitations and return to prior level of function.   OBJECTIVE IMPAIRMENTS: Abnormal gait, decreased activity tolerance, decreased balance, decreased endurance, decreased mobility, difficulty walking, decreased ROM, decreased strength, and hypomobility.   ACTIVITY LIMITATIONS: carrying, lifting, bending, sitting, standing, squatting, sleeping, stairs, and bed mobility  PARTICIPATION LIMITATIONS: cleaning, laundry, driving, shopping, community activity, and yard work  PERSONAL FACTORS: Age and 1-2 comorbidities: COPD/CAD are also affecting patient's functional outcome.   REHAB POTENTIAL: Good  CLINICAL DECISION MAKING: Stable/uncomplicated  EVALUATION COMPLEXITY: Low   GOALS: Goals reviewed with patient? Yes  SHORT TERM GOALS: Target date: 11/13/23  Patient will be independent with HEP in order to improve functional outcomes. Baseline:  Goal status: INITIAL  2.  Patient will report at least 25% improvement in symptoms for improved quality of life. Baseline:  Goal status: INITIAL   LONG TERM GOALS: Target date: 01/22/24  Patient will report at least 75% improvement in symptoms for improved quality of life. Baseline:  Goal status: INITIAL  2.  Patient will improve LEFS score by at least 9 points in order to indicate improved tolerance to activity. Baseline: 29 Goal status: INITIAL  3.  Patient will be able to navigate stairs with reciprocal pattern without compensation in order to demonstrate improved LE strength. Baseline:  Goal status: INITIAL  4.  Patient will improve ROM for  knee flexion to 0-115 degrees to improve squatting, and other functional mobility. Baseline: 89 Goal status: INITIAL PLAN:  PT FREQUENCY: 1x/week  PT DURATION: 12 weeks  PLANNED INTERVENTIONS: [id number]- PT Re-evaluation, 97110-Therapeutic exercises, 97530- Therapeutic activity, [id number]- Neuromuscular re-education, 97535- Self Care, [id number]- Manual therapy, [id number]- Gait training, [phone]- Aquatic  Therapy, [phone]- Wound care (first 20 sq cm), 97598- Wound care (each additional 20 sq cm), 20560 (1-2 muscles), 20561 (3+ muscles)- Dry Needling, Patient/Family education, Balance training, Stair training, Taping, Joint mobilization, Joint manipulation, Spinal manipulation, Spinal mobilization, Compression bandaging, and DME instructions    PLAN FOR NEXT SESSION: Continue to progress ROM and strength as tolerated.   Lili Finder, Student-PT 10/30/2023, 2:31 PM    This entire session was performed under direct supervision and direction of a licensed therapist/therapist assistant . I have personally read, edited and approve of the note as written. 3:34 PM, 10/30/23 Prentice CANDIE Stains PT, DPT Physical Therapist at [facility]

## 2023-11-04 ENCOUNTER — Other Ambulatory Visit: Payer: Self-pay | Admitting: Family Medicine

## 2023-11-04 DIAGNOSIS — Z1231 Encounter for screening mammogram for malignant neoplasm of breast: Secondary | ICD-10-CM

## 2023-11-06 ENCOUNTER — Ambulatory Visit (HOSPITAL_BASED_OUTPATIENT_CLINIC_OR_DEPARTMENT_OTHER): Attending: Orthopaedic Surgery | Admitting: Physical Therapy

## 2023-11-06 ENCOUNTER — Ambulatory Visit (INDEPENDENT_AMBULATORY_CARE_PROVIDER_SITE_OTHER)

## 2023-11-06 ENCOUNTER — Encounter (HOSPITAL_BASED_OUTPATIENT_CLINIC_OR_DEPARTMENT_OTHER): Payer: Self-pay | Admitting: Physical Therapy

## 2023-11-06 ENCOUNTER — Ambulatory Visit (INDEPENDENT_AMBULATORY_CARE_PROVIDER_SITE_OTHER): Admitting: Orthopaedic Surgery

## 2023-11-06 ENCOUNTER — Other Ambulatory Visit (HOSPITAL_BASED_OUTPATIENT_CLINIC_OR_DEPARTMENT_OTHER): Payer: Self-pay

## 2023-11-06 DIAGNOSIS — R2689 Other abnormalities of gait and mobility: Secondary | ICD-10-CM | POA: Insufficient documentation

## 2023-11-06 DIAGNOSIS — Z4789 Encounter for other orthopedic aftercare: Secondary | ICD-10-CM | POA: Diagnosis not present

## 2023-11-06 DIAGNOSIS — M25561 Pain in right knee: Secondary | ICD-10-CM | POA: Diagnosis not present

## 2023-11-06 DIAGNOSIS — R29898 Other symptoms and signs involving the musculoskeletal system: Secondary | ICD-10-CM | POA: Insufficient documentation

## 2023-11-06 DIAGNOSIS — M1711 Unilateral primary osteoarthritis, right knee: Secondary | ICD-10-CM

## 2023-11-06 MED ORDER — OXYCODONE HCL 5 MG PO TABS
5.0000 mg | ORAL_TABLET | ORAL | 0 refills | Status: DC | PRN
  Filled 2023-11-06: qty 20, 4d supply, fill #0

## 2023-11-06 NOTE — Progress Notes (Signed)
 Post Operative Evaluation    Procedure/Date of Surgery: Right knee medial unicompartmental knee arthroplasty 9/28  Interval History:    Presents 2 weeks status post above procedure.  Overall she is doing well.  She did get some significant bruising and swelling for the first several weeks although this is continuing to improve.   PMH/PSH/Family History/Social History/Meds/Allergies:    Past Medical History:  Diagnosis Date   Anxiety    Arthritis 2018   Bulging lumbar disc 2018   Complication of anesthesia    low blood pressure after retinal detachment surgery   Constipation    COPD (chronic obstructive pulmonary disease) (HCC)    Coronary artery disease    follows up with dr okey prn   Degenerative disc disease, lumbar 2018   Depression    Emphysema of lung (HCC)    Family history of ovarian cancer    mother died of ovarian cancer   GERD (gastroesophageal reflux disease)    History of hiatal hernia    small   History of kidney stones    History of pneumonia    multiple times   History of stomach ulcers yrs ago   Hyperlipidemia    lung ca 05/2003   Multiple lung nodules    followed by dr kerrin ronco 04-10-2020   Osteoarthritis    spine, knees, hands   PONV (postoperative nausea and vomiting)    pt could not eat during hospital stay   Reflux esophagitis    Scoliosis 2018   Shortness of breath    with lifting heavy things   Wears glasses    Past Surgical History:  Procedure Laterality Date   BREAST EXCISIONAL BIOPSY Left yrs ago   BREAST EXCISIONAL BIOPSY Left    BREAST EXCISIONAL BIOPSY Right 2015   BREAST LUMPECTOMY WITH NEEDLE LOCALIZATION Right 04/11/2013   Procedure: BREAST LUMPECTOMY WITH NEEDLE LOCALIZATION;  Surgeon: Krystal JINNY Russell, MD;  Location: [facility];  Service: General;  Laterality: Right;   BREAST SURGERY  1992   lt br bx   CHOLECYSTECTOMY  11/11/2011   Procedure: LAPAROSCOPIC  CHOLECYSTECTOMY WITH INTRAOPERATIVE CHOLANGIOGRAM;  Surgeon: Krystal JINNY Russell, MD;  Location: [facility] OR;  Service: General;  Laterality: N/A;   CYSTOSCOPY  01/2009   for evaluation of hematuria   CYSTOSCOPY N/A 04/23/2020   Procedure: CYSTOSCOPY;  Surgeon: Marilynne Rosaline SAILOR, MD;  Location: [facility];  Service: Gynecology;  Laterality: N/A;   CYSTOSCOPY W/ URETERAL STENT PLACEMENT Left 11/16/2019   Procedure: CYSTOSCOPY WITH STENT REPLACEMENT;  Surgeon: Renda Glance, MD;  Location: [facility];  Service: Urology;  Laterality: Left;   DILATION AND CURETTAGE OF UTERUS  yrs ago   x 2   ESOPHAGOGASTRODUODENOSCOPY  yrs ago   EYE SURGERY  yrs ago   catataracts-both   KNEE SURGERY  yrs ago   rt knee torn cartilage   LUNG LOBECTOMY Left 05/2003   PARTIAL KNEE ARTHROPLASTY Right 10/27/2023   Procedure: ARTHROPLASTY, KNEE, UNICOMPARTMENTAL;  Surgeon: Genelle Standing, MD;  Location: [facility];  Service: Orthopedics;  Laterality: Right;  RIGHT KNEE MEDIAL UNICOMPARTMENTAL ARTHROPLASTY   RETINAL DETACHMENT SURGERY  2011   left; x2   SINUS SURGERY WITH INSTATRAK  yrs ago   TUBAL LIGATION  yrs ago   URETEROSCOPY WITH HOLMIUM LASER LITHOTRIPSY Left  11/29/2019   Procedure: CYSTOSCOPY; LEFT URETEROSCOPY; HOLMIUM LASER LITHOTRIPSY; LEFT URETERAL STENT EXCHANGE;  Surgeon: Nieves Cough, MD;  Location: [facility];  Service: Urology;  Laterality: Left;  ONLY NEEDS 60 MIN   VAGINAL HYSTERECTOMY N/A 04/23/2020   Procedure: HYSTERECTOMY VAGINAL with bilateral salpingooopherectomy;;  Surgeon: Cleotilde Ronal RAMAN, MD;  Location: [facility];  Service: Gynecology;  Laterality: N/A;  Dr Cleotilde to perform; total time requested for all procedures is 3 hours   VAGINAL PROLAPSE REPAIR N/A 04/23/2020   Procedure: VAGINAL VAULT SUSPENSION;  Surgeon: Marilynne Rosaline SAILOR, MD;  Location: [facility];  Service: Gynecology;  Laterality: N/A;   VIDEO BRONCHOSCOPY WITH ENDOBRONCHIAL NAVIGATION  N/A 01/15/2015   Procedure: VIDEO BRONCHOSCOPY WITH ENDOBRONCHIAL NAVIGATION;  Surgeon: Elspeth JAYSON Millers, MD;  Location: [facility];  Service: Thoracic;  Laterality: N/A;   VOCAL CORD LATERALIZATION, ENDOSCOPIC APPROACH W/ MLB  yrs ago   nodule removed   Social History   Socioeconomic History   Marital status: Married    Spouse name: Not on file   Number of children: 2   Years of education: Not on file   Highest education level: Not on file  Occupational History   Occupation: Retired  Tobacco Use   Smoking status: Former    Current packs/day: 0.00    Average packs/day: 2.0 packs/day for 45.6 years (91.3 ttl pk-yrs)    Types: Cigarettes    Start date: 23    Quit date: 09/21/2008    Years since quitting: 15.1   Smokeless tobacco: Never  Vaping Use   Vaping status: Never Used  Substance and Sexual Activity   Alcohol use: Yes    Alcohol/week: 0.0 - 1.0 standard drinks of alcohol    Comment: occ   Drug use: No   Sexual activity: Not Currently    Partners: Male    Birth control/protection: Post-menopausal  Other Topics Concern   Not on file  Social History Narrative   Not on file   Social Drivers of Health   Financial Resource Strain: Not on file  Food Insecurity: Not on file  Transportation Needs: Not on file  Physical Activity: Not on file  Stress: Not on file  Social Connections: Not on file   Family History  Problem Relation Age of Onset   Hypertension Mother    Diabetes Mother    Ovarian cancer Mother 61   Alcohol abuse Father    Allergies  Allergen Reactions   Adhesive [Tape] Other (See Comments)    Burned skin  TAPE WITH BETADINE  was used   Septra [Sulfamethoxazole-Trimethoprim] Hives   Amoxicillin Other (See Comments)    Had a case of thrush   Current Outpatient Medications  Medication Sig Dispense Refill   acetaminophen  (TYLENOL ) 500 MG tablet Take 500 mg by mouth every 8 (eight) hours.     amitriptyline  (ELAVIL ) 100 MG tablet Take 100 mg by  mouth at bedtime.     aspirin  EC 325 MG tablet Take 325 mg by mouth daily.     Cholecalciferol (VITAMIN D3 PO) Take 2,000 Units by mouth daily.     clonazePAM (KLONOPIN) 1 MG tablet Take 1 mg by mouth at bedtime.     fluticasone  (FLONASE) 50 MCG/ACT nasal spray Place 2 sprays into both nostrils daily.     fluticasone -salmeterol (WIXELA INHUB) 250-50 MCG/ACT AEPB Inhale 1 puff into the lungs in the morning and at bedtime. 60 each 11   ibuprofen  (ADVIL ) 800 MG tablet Take 800 mg  by mouth every 8 (eight) hours as needed (pain.).     Multiple Vitamin (MULTIVITAMIN WITH MINERALS) TABS tablet Take 1 tablet by mouth daily.     oxyCODONE  (ROXICODONE ) 5 MG immediate release tablet Take 1 tablet (5 mg total) by mouth every 4 (four) hours as needed for severe pain (pain score 7-10) or breakthrough pain. 15 tablet 0   Psyllium (METAMUCIL 3 IN 1 DAILY FIBER PO) Take 2 capsules by mouth daily.     rosuvastatin (CRESTOR) 5 MG tablet Take 5 mg by mouth 4 (four) times a week. Mondays through Thursdays.     tiZANidine (ZANAFLEX) 4 MG tablet Take 4 mg by mouth 3 (three) times daily as needed for muscle spasms.     traMADol  (ULTRAM ) 50 MG tablet Take 50 mg by mouth every 12 (twelve) hours as needed (pain.).     traMADol  (ULTRAM ) 50 MG tablet Take 1 tablet (50 mg total) by mouth every 6 (six) hours as needed. 30 tablet 0   No current facility-administered medications for this visit.   No results found.  Review of Systems:   A ROS was performed including pertinent positives and negatives as documented in the HPI.   Musculoskeletal Exam:    Right knee incision is well-appearing without erythema or drainage.  There are some mild weeping.  There is significant bruising about the knee proximally distally range of motion is from 0 to 90 degrees  Imaging:    3 views right knee: Status post right knee unicompartmental knee arthroplasty without evidence of complication  I personally reviewed and interpreted the  radiographs.   Assessment:   2 weeks status post right medial unicompartmental knee arthroplasty overall doing well without evidence of complication.  At this time she will continue to progress through the rehab protocol and I will see her back in 4 weeks to discuss results.  1 additional oxycodone  prescription was sent  Plan :    - Return to clinic 4 weeks for reassessment      I personally saw and evaluated the patient, and participated in the management and treatment plan.  Elspeth Parker, MD Attending Physician, Orthopedic Surgery  This document was dictated using Dragon voice recognition software. A reasonable attempt at proof reading has been made to minimize errors.

## 2023-11-06 NOTE — Therapy (Signed)
 OUTPATIENT PHYSICAL THERAPY LOWER EXTREMITY TREATMENT    Patient Name: Rashelle Ireland MRN: [id number] DOB:07-30-1950, 73 y.o., female Today's Date: 11/06/2023  END OF SESSION:  PT End of Session - 11/06/23 1357     Visit Number 2    Number of Visits 24    Date for Recertification  01/22/24    Authorization Type [employer]    Progress Note Due on Visit 10    PT Start Time 1349    PT Stop Time 1427    PT Time Calculation (min) 38 min    Activity Tolerance Patient tolerated treatment well    Behavior During Therapy [facility] for tasks assessed/performed           Past Medical History:  Diagnosis Date   Anxiety    Arthritis 2018   Bulging lumbar disc 2018   Complication of anesthesia    low blood pressure after retinal detachment surgery   Constipation    COPD (chronic obstructive pulmonary disease) (HCC)    Coronary artery disease    follows up with dr okey prn   Degenerative disc disease, lumbar 2018   Depression    Emphysema of lung (HCC)    Family history of ovarian cancer    mother died of ovarian cancer   GERD (gastroesophageal reflux disease)    History of hiatal hernia    small   History of kidney stones    History of pneumonia    multiple times   History of stomach ulcers yrs ago   Hyperlipidemia    lung ca 05/2003   Multiple lung nodules    followed by dr kerrin ronco 04-10-2020   Osteoarthritis    spine, knees, hands   PONV (postoperative nausea and vomiting)    pt could not eat during hospital stay   Reflux esophagitis    Scoliosis 2018   Shortness of breath    with lifting heavy things   Wears glasses    Past Surgical History:  Procedure Laterality Date   BREAST EXCISIONAL BIOPSY Left yrs ago   BREAST EXCISIONAL BIOPSY Left    BREAST EXCISIONAL BIOPSY Right 2015   BREAST LUMPECTOMY WITH NEEDLE LOCALIZATION Right 04/11/2013   Procedure: BREAST LUMPECTOMY WITH NEEDLE LOCALIZATION;  Surgeon: Krystal JINNY Russell, MD;  Location: [facility]  [place];  Service: General;  Laterality: Right;   BREAST SURGERY  1992   lt br bx   CHOLECYSTECTOMY  11/11/2011   Procedure: LAPAROSCOPIC CHOLECYSTECTOMY WITH INTRAOPERATIVE CHOLANGIOGRAM;  Surgeon: Krystal JINNY Russell, MD;  Location: [facility] OR;  Service: General;  Laterality: N/A;   CYSTOSCOPY  01/2009   for evaluation of hematuria   CYSTOSCOPY N/A 04/23/2020   Procedure: CYSTOSCOPY;  Surgeon: Marilynne Rosaline SAILOR, MD;  Location: [facility];  Service: Gynecology;  Laterality: N/A;   CYSTOSCOPY W/ URETERAL STENT PLACEMENT Left 11/16/2019   Procedure: CYSTOSCOPY WITH STENT REPLACEMENT;  Surgeon: Renda Glance, MD;  Location: [facility];  Service: Urology;  Laterality: Left;   DILATION AND CURETTAGE OF UTERUS  yrs ago   x 2   ESOPHAGOGASTRODUODENOSCOPY  yrs ago   EYE SURGERY  yrs ago   catataracts-both   KNEE SURGERY  yrs ago   rt knee torn cartilage   LUNG LOBECTOMY Left 05/2003   PARTIAL KNEE ARTHROPLASTY Right 10/27/2023   Procedure: ARTHROPLASTY, KNEE, UNICOMPARTMENTAL;  Surgeon: Genelle Standing, MD;  Location: [facility];  Service: Orthopedics;  Laterality: Right;  RIGHT KNEE MEDIAL UNICOMPARTMENTAL ARTHROPLASTY   RETINAL DETACHMENT  SURGERY  2011   left; x2   SINUS SURGERY WITH INSTATRAK  yrs ago   TUBAL LIGATION  yrs ago   URETEROSCOPY WITH HOLMIUM LASER LITHOTRIPSY Left 11/29/2019   Procedure: CYSTOSCOPY; LEFT URETEROSCOPY; HOLMIUM LASER LITHOTRIPSY; LEFT URETERAL STENT EXCHANGE;  Surgeon: Nieves Cough, MD;  Location: [facility];  Service: Urology;  Laterality: Left;  ONLY NEEDS 60 MIN   VAGINAL HYSTERECTOMY N/A 04/23/2020   Procedure: HYSTERECTOMY VAGINAL with bilateral salpingooopherectomy;;  Surgeon: Cleotilde Ronal RAMAN, MD;  Location: [facility];  Service: Gynecology;  Laterality: N/A;  Dr Cleotilde to perform; total time requested for all procedures is 3 hours   VAGINAL PROLAPSE REPAIR N/A 04/23/2020   Procedure: VAGINAL VAULT SUSPENSION;  Surgeon:  Marilynne Rosaline SAILOR, MD;  Location: [facility];  Service: Gynecology;  Laterality: N/A;   VIDEO BRONCHOSCOPY WITH ENDOBRONCHIAL NAVIGATION N/A 01/15/2015   Procedure: VIDEO BRONCHOSCOPY WITH ENDOBRONCHIAL NAVIGATION;  Surgeon: Elspeth JAYSON Millers, MD;  Location: [facility];  Service: Thoracic;  Laterality: N/A;   VOCAL CORD LATERALIZATION, ENDOSCOPIC APPROACH W/ MLB  yrs ago   nodule removed   Patient Active Problem List   Diagnosis Date Noted   Unilateral primary osteoarthritis, right knee 10/27/2023   Hardening of the aorta (main artery of the heart) 02/09/2020   Gastro-esophageal reflux disease without esophagitis 02/09/2020   Insomnia 02/09/2020   Major depression in complete remission 02/09/2020   Moderate COPD (chronic obstructive pulmonary disease) (HCC) 12/25/2014   Nodule of right lung 12/11/2014   Papilloma of right breast 03/23/2013   Primary cancer of left upper lobe of lung (HCC) 11/27/2011   Symptomatic cholelithiasis 09/22/2011    PCP: Chrystal Lamarr RAMAN, MD  REFERRING PROVIDER: Genelle Elspeth, MD  REFERRING DIAG: M17.11 (ICD-10-CM) - Unilateral primary osteoarthritis, right knee  THERAPY DIAG:  Right knee pain, unspecified chronicity  Other abnormalities of gait and mobility  Other symptoms and signs involving the musculoskeletal system  Rationale for Evaluation and Treatment: Rehabilitation  ONSET DATE: DOS 10/20/23  SUBJECTIVE:   SUBJECTIVE STATEMENT:  They tried to take the stitches out today but we weren't able to do it today bc I have too much swelling, going to try again in another week. Have been doing some old knee flexibility exercises from a past MD. Have been swelling more than they thought I would. Knee has been buckling on and off, it happened at the counter but I was able to catch myself this morning      EVAL: Right medial unicompartmental knee arthroplasty. pt reports that she is feeling good today. Yesterday was not good.  Did two loads of laundry yesterday, took her meds, and laid down with her cooing machine. Reports that her scoliosis doctor told her she needed to get her knee done before having back surgery. May have to have left knee done in another year.   PERTINENT HISTORY: Bulging lumbar disc (2018), COPD, CAD, DDD Lumbar (2018), Emphysema, OA (spine, knees, hands), scoliosis (2018), SOB with heavy lifting  PAIN:  Are you having pain? Yes: NPRS scale: 3/10 Pain location: Right knee Pain description: tightness, discomfort, aching  Aggravating factors: Moving Relieving factors: Ice  PRECAUTIONS: None  RED FLAGS: None   WEIGHT BEARING RESTRICTIONS: No  FALLS:  Has patient fallen in last 6 months? No and    OCCUPATION: Retired   PLOF: Independent  PATIENT GOALS: Doing stuff around the house like decorating   NEXT MD VISIT: 11/06/23  OBJECTIVE:  Note: Objective measures were  completed at Evaluation unless otherwise noted.  PATIENT SURVEYS:  LEFS  Extreme difficulty/unable (0), Quite a bit of difficulty (1), Moderate difficulty (2), Little difficulty (3), No difficulty (4) Survey date:  Eval 9/25  Any of your usual work, housework or school activities 2  2. Usual hobbies, recreational or sporting activities 2  3. Getting into/out of the bath 2  4. Walking between rooms 2  5. Putting on socks/shoes 0  6. Squatting  0  7. Lifting an object, like a bag of groceries from the floor 4  8. Performing light activities around your home 4  9. Performing heavy activities around your home 1  10. Getting into/out of a car 2  11. Walking 2 blocks 2  12. Walking 1 mile 0  13. Going up/down 10 stairs (1 flight) 0  14. Standing for 1 hour 0  15.  sitting for 1 hour 4  16. Running on even ground 0  17. Running on uneven ground 0  18. Making sharp turns while running fast 0  19. Hopping  0  20. Rolling over in bed 4  Score total:  29     COGNITION: Overall cognitive status: Within functional  limits for tasks assessed     SENSATION: WFL  POSTURE: rounded shoulders, forward head, increased lumbar lordosis, and increased thoracic kyphosis   LOWER EXTREMITY ROM:  Active ROM (AAROM) Right eval R seated AROM 11/06/23  Hip flexion    Hip extension    Hip abduction    Hip adduction    Hip internal rotation    Hip external rotation    Knee flexion 89 98*  Knee extension Lacking 2   Ankle dorsiflexion    Ankle plantarflexion    Ankle inversion    Ankle eversion     (Blank rows = not tested)  LOWER EXTREMITY MMT: good quad set, extensor lag with SLR; not fully assessed due to post op status  MMT Right eval Left eval  Hip flexion    Hip extension    Hip abduction    Hip adduction    Hip internal rotation    Hip external rotation    Knee flexion    Knee extension    Ankle dorsiflexion    Ankle plantarflexion    Ankle inversion    Ankle eversion     (Blank rows = not tested)                                                         TREATMENT DATE:    11/06/23  Scifit bike seat 7 partial rotations x8 min for w/u and ROM ROM update   SAQ 3# 2x10  Bridges + progressive knee flexion 5x3  Quad stretch off side of table 3x30 seconds  Knee flexion stretch at edge of table 10x3 second holds LAQ 3# 2x5  Tandem stance solid surface 2x30 seconds B        10/30/23 Bandage change  Eval and HEP Towel slides x10  SLR x10 SAQ x10 Quad sets x10  PATIENT EDUCATION:  Education details: HEP, activity tolerance, and pain management.  Person educated: Patient Education method: Explanation Education comprehension: verbalized understanding  HOME EXERCISE PROGRAM: Access Code: [email or web address] URL: [email or web address]  Date: 10/30/2023  Prepared by: Prentice Stains  Exercises - Seated Heel Slide  -  2 x daily - 7 x weekly - 2 sets - 10 reps - Long Sitting Quad Set  - 5 x daily - 7 x weekly - 2 sets - 10 reps - 10 second hold - Supine Heel Slide with Strap   - 5 x daily - 7 x weekly - 10 reps - 10 second hold - Active Straight Leg Raise with Quad Set  - 5 x daily - 7 x weekly - 2 sets - 10 reps - Long Sitting Calf Stretch with Strap  - 1 x daily - 7 x weekly - 3 reps - 20 second hold - Seated Ankle Pumps  - 5 x daily - 7 x weekly - 20 reps     ASSESSMENT:  CLINICAL IMPRESSION:  Arrives today doing OK, they were unable to remove her stitches due to swelling this morning. She has had some episodes of knee buckling too. Focused on ROM and strength, especially for the quad, as tolerated. Will continue to progress her as tolerated. Discussed use of LRAD to help support her surgical LE a little more and ideally prevent more episodes of knee buckling until we get her LE stronger, right now she's not open to this.     EVAL: Patient is a 73 y.o. female who was seen today for physical therapy evaluation and treatment s/p right unicompartmental knee arthroplasty on 10/20/23. Patient demonstrates deficits in knee range of motion, strength, activity tolerance, and pain. HEP was given and patient was educated on expected soreness and pain management. Patient will benefit from physical therapy to address current limitations and return to prior level of function.   OBJECTIVE IMPAIRMENTS: Abnormal gait, decreased activity tolerance, decreased balance, decreased endurance, decreased mobility, difficulty walking, decreased ROM, decreased strength, and hypomobility.   ACTIVITY LIMITATIONS: carrying, lifting, bending, sitting, standing, squatting, sleeping, stairs, and bed mobility  PARTICIPATION LIMITATIONS: cleaning, laundry, driving, shopping, community activity, and yard work  PERSONAL FACTORS: Age and 1-2 comorbidities: COPD/CAD are also affecting patient's functional outcome.   REHAB POTENTIAL: Good  CLINICAL DECISION MAKING: Stable/uncomplicated  EVALUATION COMPLEXITY: Low   GOALS: Goals reviewed with patient? Yes  SHORT TERM GOALS: Target date:  11/13/23  Patient will be independent with HEP in order to improve functional outcomes. Baseline:  Goal status: INITIAL  2.  Patient will report at least 25% improvement in symptoms for improved quality of life. Baseline:  Goal status: INITIAL   LONG TERM GOALS: Target date: 01/22/24  Patient will report at least 75% improvement in symptoms for improved quality of life. Baseline:  Goal status: INITIAL  2.  Patient will improve LEFS score by at least 9 points in order to indicate improved tolerance to activity. Baseline: 29 Goal status: INITIAL  3.  Patient will be able to navigate stairs with reciprocal pattern without compensation in order to demonstrate improved LE strength. Baseline:  Goal status: INITIAL  4.  Patient will improve ROM for  knee flexion to 0-115 degrees to improve squatting, and other functional mobility. Baseline: 89 Goal status: INITIAL PLAN:  PT FREQUENCY: 1x/week  PT DURATION: 12 weeks  PLANNED INTERVENTIONS: [id number]- PT Re-evaluation, 97110-Therapeutic exercises, 97530- Therapeutic activity, 97112- Neuromuscular re-education, 97535- Self Care, [id number]- Manual therapy, [id number]- Gait training, [phone]- Aquatic Therapy, [phone]- Wound care (first 20 sq cm), 97598- Wound care (each additional 20 sq cm), 20560 (1-2 muscles), [id number] (3+ muscles)- Dry Needling, Patient/Family education, Balance training, Stair training, Taping, Joint mobilization, Joint manipulation, Spinal manipulation, Spinal mobilization, Compression bandaging, and DME instructions  PLAN FOR NEXT SESSION: Continue to progress ROM and strength as tolerated. Keep working on quad to reduce incidents of knee buckling.   Josette Rough, PT, DPT 11/06/23 2:28 PM

## 2023-11-10 DIAGNOSIS — H35371 Puckering of macula, right eye: Secondary | ICD-10-CM | POA: Diagnosis not present

## 2023-11-10 DIAGNOSIS — H59812 Chorioretinal scars after surgery for detachment, left eye: Secondary | ICD-10-CM | POA: Diagnosis not present

## 2023-11-11 ENCOUNTER — Other Ambulatory Visit (HOSPITAL_BASED_OUTPATIENT_CLINIC_OR_DEPARTMENT_OTHER): Payer: Self-pay | Admitting: Orthopaedic Surgery

## 2023-11-11 ENCOUNTER — Other Ambulatory Visit (HOSPITAL_BASED_OUTPATIENT_CLINIC_OR_DEPARTMENT_OTHER): Payer: Self-pay

## 2023-11-11 ENCOUNTER — Ambulatory Visit (HOSPITAL_BASED_OUTPATIENT_CLINIC_OR_DEPARTMENT_OTHER)

## 2023-11-11 ENCOUNTER — Encounter (HOSPITAL_BASED_OUTPATIENT_CLINIC_OR_DEPARTMENT_OTHER): Payer: Self-pay

## 2023-11-11 DIAGNOSIS — M25561 Pain in right knee: Secondary | ICD-10-CM | POA: Diagnosis not present

## 2023-11-11 DIAGNOSIS — R2689 Other abnormalities of gait and mobility: Secondary | ICD-10-CM | POA: Diagnosis not present

## 2023-11-11 DIAGNOSIS — R29898 Other symptoms and signs involving the musculoskeletal system: Secondary | ICD-10-CM

## 2023-11-11 MED ORDER — CEPHALEXIN 500 MG PO CAPS
500.0000 mg | ORAL_CAPSULE | Freq: Four times a day (QID) | ORAL | 0 refills | Status: AC
  Filled 2023-11-11: qty 12, 3d supply, fill #0

## 2023-11-11 NOTE — Therapy (Signed)
 OUTPATIENT PHYSICAL THERAPY LOWER EXTREMITY TREATMENT    Patient Name: Tammy Eaton MRN: [id number] DOB:[birth year], 73 y.o., female Today's Date: 11/11/2023  END OF SESSION:  PT End of Session - 11/11/23 1436     Visit Number 3    Number of Visits 24    Date for Recertification  01/22/24    Authorization Type [employer]    Progress Note Due on Visit 10    PT Start Time 1434    PT Stop Time 1515   last 10 min for ice   PT Time Calculation (min) 41 min    Activity Tolerance Patient tolerated treatment well    Behavior During Therapy [facility] for tasks assessed/performed            Past Medical History:  Diagnosis Date   Anxiety    Arthritis 2018   Bulging lumbar disc 2018   Complication of anesthesia    low blood pressure after retinal detachment surgery   Constipation    COPD (chronic obstructive pulmonary disease) (HCC)    Coronary artery disease    follows up with dr okey prn   Degenerative disc disease, lumbar 2018   Depression    Emphysema of lung (HCC)    Family history of ovarian cancer    mother died of ovarian cancer   GERD (gastroesophageal reflux disease)    History of hiatal hernia    small   History of kidney stones    History of pneumonia    multiple times   History of stomach ulcers yrs ago   Hyperlipidemia    lung ca 05/2003   Multiple lung nodules    followed by dr kerrin ronco 04-10-2020   Osteoarthritis    spine, knees, hands   PONV (postoperative nausea and vomiting)    pt could not eat during hospital stay   Reflux esophagitis    Scoliosis 2018   Shortness of breath    with lifting heavy things   Wears glasses    Past Surgical History:  Procedure Laterality Date   BREAST EXCISIONAL BIOPSY Left yrs ago   BREAST EXCISIONAL BIOPSY Left    BREAST EXCISIONAL BIOPSY Right 2015   BREAST LUMPECTOMY WITH NEEDLE LOCALIZATION Right 04/11/2013   Procedure: BREAST LUMPECTOMY WITH NEEDLE LOCALIZATION;  Surgeon: Krystal JINNY Russell,  MD;  Location: [facility];  Service: General;  Laterality: Right;   BREAST SURGERY  1992   lt br bx   CHOLECYSTECTOMY  11/11/2011   Procedure: LAPAROSCOPIC CHOLECYSTECTOMY WITH INTRAOPERATIVE CHOLANGIOGRAM;  Surgeon: Krystal JINNY Russell, MD;  Location: [facility];  Service: General;  Laterality: N/A;   CYSTOSCOPY  01/2009   for evaluation of hematuria   CYSTOSCOPY N/A 04/23/2020   Procedure: CYSTOSCOPY;  Surgeon: Marilynne Rosaline SAILOR, MD;  Location[facility];  Service: Gynecology;  Laterality: N/A;   CYSTOSCOPY W/ URETERAL STENT PLACEMENT Left 11/16/2019   Procedure: CYSTOSCOPY WITH STENT REPLACEMENT;  Surgeon: Renda Glance, MD;  Location: [facility];  Service: Urology;  Laterality: Left;   DILATION AND CURETTAGE OF UTERUS  yrs ago   x 2   ESOPHAGOGASTRODUODENOSCOPY  yrs ago   EYE SURGERY  yrs ago   catataracts-both   KNEE SURGERY  yrs ago   rt knee torn cartilage   LUNG LOBECTOMY Left 05/2003   PARTIAL KNEE ARTHROPLASTY Right 10/27/2023   Procedure: ARTHROPLASTY, KNEE, UNICOMPARTMENTAL;  Surgeon: Genelle Standing, MD;  Location: [facility];  Service: Orthopedics;  Laterality: Right;  RIGHT KNEE  MEDIAL UNICOMPARTMENTAL ARTHROPLASTY   RETINAL DETACHMENT SURGERY  2011   left; x2   SINUS SURGERY WITH INSTATRAK  yrs ago   TUBAL LIGATION  yrs ago   URETEROSCOPY WITH HOLMIUM LASER LITHOTRIPSY Left 11/29/2019   Procedure: CYSTOSCOPY; LEFT URETEROSCOPY; HOLMIUM LASER LITHOTRIPSY; LEFT URETERAL STENT EXCHANGE;  Surgeon: Nieves Cough, MD;  Location: [facility];  Service: Urology;  Laterality: Left;  ONLY NEEDS 60 MIN   VAGINAL HYSTERECTOMY N/A 04/23/2020   Procedure: HYSTERECTOMY VAGINAL with bilateral salpingooopherectomy;;  Surgeon: Cleotilde Ronal RAMAN, MD;  Location: [facility];  Service: Gynecology;  Laterality: N/A;  Dr Cleotilde to perform; total time requested for all procedures is 3 hours   VAGINAL PROLAPSE REPAIR N/A 04/23/2020   Procedure: VAGINAL VAULT  SUSPENSION;  Surgeon: Marilynne Rosaline SAILOR, MD;  Location: [facility];  Service: Gynecology;  Laterality: N/A;   VIDEO BRONCHOSCOPY WITH ENDOBRONCHIAL NAVIGATION N/A 01/15/2015   Procedure: VIDEO BRONCHOSCOPY WITH ENDOBRONCHIAL NAVIGATION;  Surgeon: Elspeth JAYSON Millers, MD;  Location: [facility];  Service: Thoracic;  Laterality: N/A;   VOCAL CORD LATERALIZATION, ENDOSCOPIC APPROACH W/ MLB  yrs ago   nodule removed   Patient Active Problem List   Diagnosis Date Noted   Unilateral primary osteoarthritis, right knee 10/27/2023   Hardening of the aorta (main artery of the heart) 02/09/2020   Gastro-esophageal reflux disease without esophagitis 02/09/2020   Insomnia 02/09/2020   Major depression in complete remission 02/09/2020   Moderate COPD (chronic obstructive pulmonary disease) (HCC) 12/25/2014   Nodule of right lung 12/11/2014   Papilloma of right breast 03/23/2013   Primary cancer of left upper lobe of lung (HCC) 11/27/2011   Symptomatic cholelithiasis 09/22/2011    PCP: Chrystal Lamarr RAMAN, MD  REFERRING PROVIDER: Genelle Elspeth, MD  REFERRING DIAG: M17.11 (ICD-10-CM) - Unilateral primary osteoarthritis, right knee  THERAPY DIAG:  Right knee pain, unspecified chronicity  Other abnormalities of gait and mobility  Other symptoms and signs involving the musculoskeletal system  Rationale for Evaluation and Treatment: Rehabilitation  ONSET DATE: DOS 10/20/23  SUBJECTIVE:   SUBJECTIVE STATEMENT:  Patient reports ongoing drainage and bleeding in knee.  Patient has been using her ice machine regularly.  Patient arrives ambulating without AD.     EVAL: Right medial unicompartmental knee arthroplasty. pt reports that she is feeling good today. Yesterday was not good. Did two loads of laundry yesterday, took her meds, and laid down with her cooing machine. Reports that her scoliosis doctor told her she needed to get her knee done before having back surgery. May  have to have left knee done in another year.   PERTINENT HISTORY: Bulging lumbar disc (2018), COPD, CAD, DDD Lumbar (2018), Emphysema, OA (spine, knees, hands), scoliosis (2018), SOB with heavy lifting  PAIN:  Are you having pain? Yes: NPRS scale: 3/10 Pain location: Right knee Pain description: tightness, discomfort, aching  Aggravating factors: Moving Relieving factors: Ice  PRECAUTIONS: None  RED FLAGS: None   WEIGHT BEARING RESTRICTIONS: No  FALLS:  Has patient fallen in last 6 months? No and    OCCUPATION: Retired   PLOF: Independent  PATIENT GOALS: Doing stuff around the house like decorating   NEXT MD VISIT: 11/06/23  OBJECTIVE:  Note: Objective measures were completed at Evaluation unless otherwise noted.  PATIENT SURVEYS:  LEFS  Extreme difficulty/unable (0), Quite a bit of difficulty (1), Moderate difficulty (2), Little difficulty (3), No difficulty (4) Survey date:  Eval 9/25  Any of your usual work, housework  or school activities 2  2. Usual hobbies, recreational or sporting activities 2  3. Getting into/out of the bath 2  4. Walking between rooms 2  5. Putting on socks/shoes 0  6. Squatting  0  7. Lifting an object, like a bag of groceries from the floor 4  8. Performing light activities around your home 4  9. Performing heavy activities around your home 1  10. Getting into/out of a car 2  11. Walking 2 blocks 2  12. Walking 1 mile 0  13. Going up/down 10 stairs (1 flight) 0  14. Standing for 1 hour 0  15.  sitting for 1 hour 4  16. Running on even ground 0  17. Running on uneven ground 0  18. Making sharp turns while running fast 0  19. Hopping  0  20. Rolling over in bed 4  Score total:  29     COGNITION: Overall cognitive status: Within functional limits for tasks assessed     SENSATION: WFL  POSTURE: rounded shoulders, forward head, increased lumbar lordosis, and increased thoracic kyphosis   LOWER EXTREMITY ROM:  Active ROM  (AAROM) Right eval R seated AROM 11/06/23  Hip flexion    Hip extension    Hip abduction    Hip adduction    Hip internal rotation    Hip external rotation    Knee flexion 89 98*  Knee extension Lacking 2   Ankle dorsiflexion    Ankle plantarflexion    Ankle inversion    Ankle eversion     (Blank rows = not tested)  LOWER EXTREMITY MMT: good quad set, extensor lag with SLR; not fully assessed due to post op status  MMT Right eval Left eval  Hip flexion    Hip extension    Hip abduction    Hip adduction    Hip internal rotation    Hip external rotation    Knee flexion    Knee extension    Ankle dorsiflexion    Ankle plantarflexion    Ankle inversion    Ankle eversion     (Blank rows = not tested)                                                         TREATMENT DATE:    10/7 Inspection of incision and dressing change PROM right knee Quad sets 5 seconds x 3 minutes SAQ 3 seconds x 12 Gait in hall x 115 feet Ice and elevation end of session    11/06/23  Scifit bike seat 7 partial rotations x8 min for w/u and ROM ROM update   SAQ 3# 2x10  Bridges + progressive knee flexion 5x3  Quad stretch off side of table 3x30 seconds  Knee flexion stretch at edge of table 10x3 second holds LAQ 3# 2x5  Tandem stance solid surface 2x30 seconds B        10/30/23 Bandage change  Eval and HEP Towel slides x10  SLR x10 SAQ x10 Quad sets x10  PATIENT EDUCATION:  Education details: HEP, activity tolerance, and pain management.  Person educated: Patient Education method: Explanation Education comprehension: verbalized understanding  HOME EXERCISE PROGRAM: Access Code: [email or web address] URL: [email or web address]  Date: 10/30/2023  Prepared by: Prentice Zaunegger  Exercises - Seated Heel Slide  - 2 x daily -  7 x weekly - 2 sets - 10 reps - Long Sitting Quad Set  - 5 x daily - 7 x weekly - 2 sets - 10 reps - 10 second hold - Supine Heel Slide with Strap  - 5 x  daily - 7 x weekly - 10 reps - 10 second hold - Active Straight Leg Raise with Quad Set  - 5 x daily - 7 x weekly - 2 sets - 10 reps - Long Sitting Calf Stretch with Strap  - 1 x daily - 7 x weekly - 3 reps - 20 second hold - Seated Ankle Pumps  - 5 x daily - 7 x weekly - 20 reps     ASSESSMENT:  CLINICAL IMPRESSION:  Pt continues to experience drainage and bleeding form incision. PT Jodie Stains assisted with wound inspection and dressing change. Small amount of pus present with light pressure to lateral aspect of distal 1/3 of incision. Incision area slightly separated in this area as well. Messaged care team and pt will have antibiotics sent to [facility] pharmacy.  Patient had good tolerance for passive range of motion. Some discomfort with SAQ. Ended session with ice and elevation to address ongoing swelling and pain. Pt to see MD on Friday. Plans to hold off on scheduling additional PT visits until then.    EVAL: Patient is a 73 y.o. female who was seen today for physical therapy evaluation and treatment s/p right unicompartmental knee arthroplasty on 10/20/23. Patient demonstrates deficits in knee range of motion, strength, activity tolerance, and pain. HEP was given and patient was educated on expected soreness and pain management. Patient will benefit from physical therapy to address current limitations and return to prior level of function.   OBJECTIVE IMPAIRMENTS: Abnormal gait, decreased activity tolerance, decreased balance, decreased endurance, decreased mobility, difficulty walking, decreased ROM, decreased strength, and hypomobility.   ACTIVITY LIMITATIONS: carrying, lifting, bending, sitting, standing, squatting, sleeping, stairs, and bed mobility  PARTICIPATION LIMITATIONS: cleaning, laundry, driving, shopping, community activity, and yard work  PERSONAL FACTORS: Age and 1-2 comorbidities: COPD/CAD are also affecting patient's functional outcome.   REHAB POTENTIAL:  Good  CLINICAL DECISION MAKING: Stable/uncomplicated  EVALUATION COMPLEXITY: Low   GOALS: Goals reviewed with patient? Yes  SHORT TERM GOALS: Target date: 11/13/23  Patient will be independent with HEP in order to improve functional outcomes. Baseline:  Goal status: INITIAL  2.  Patient will report at least 25% improvement in symptoms for improved quality of life. Baseline:  Goal status: INITIAL   LONG TERM GOALS: Target date: 01/22/24  Patient will report at least 75% improvement in symptoms for improved quality of life. Baseline:  Goal status: INITIAL  2.  Patient will improve LEFS score by at least 9 points in order to indicate improved tolerance to activity. Baseline: 29 Goal status: INITIAL  3.  Patient will be able to navigate stairs with reciprocal pattern without compensation in order to demonstrate improved LE strength. Baseline:  Goal status: INITIAL  4.  Patient will improve ROM for  knee flexion to 0-115 degrees to improve squatting, and other functional mobility. Baseline: 89 Goal status: INITIAL PLAN:  PT FREQUENCY: 1x/week  PT DURATION: 12 weeks  PLANNED INTERVENTIONS: [id number]- PT Re-evaluation, 97110-Therapeutic exercises, 97530- Therapeutic activity, [id number]- Neuromuscular re-education, 97535- Self Care, [id number]- Manual therapy, [id number]- Gait training, [phone]- Aquatic Therapy, [phone]- Wound care (first 20 sq cm), 97598- Wound care (each additional 20 sq cm), 20560 (1-2 muscles), 20561 (3+ muscles)- Dry Needling, Patient/Family education, Balance training,  Stair training, Taping, Joint mobilization, Joint manipulation, Spinal manipulation, Spinal mobilization, Compression bandaging, and DME instructions    PLAN FOR NEXT SESSION: Continue to progress ROM and strength as tolerated. Keep working on quad to reduce incidents of knee buckling.   Asberry Rodes, PTA  11/11/23 5:39 PM

## 2023-11-13 ENCOUNTER — Ambulatory Visit (INDEPENDENT_AMBULATORY_CARE_PROVIDER_SITE_OTHER): Admitting: Orthopaedic Surgery

## 2023-11-13 ENCOUNTER — Other Ambulatory Visit (HOSPITAL_BASED_OUTPATIENT_CLINIC_OR_DEPARTMENT_OTHER): Payer: Self-pay

## 2023-11-13 ENCOUNTER — Ambulatory Visit (HOSPITAL_BASED_OUTPATIENT_CLINIC_OR_DEPARTMENT_OTHER): Admitting: Orthopaedic Surgery

## 2023-11-13 DIAGNOSIS — M1711 Unilateral primary osteoarthritis, right knee: Secondary | ICD-10-CM

## 2023-11-13 MED ORDER — DOXYCYCLINE HYCLATE 100 MG PO TABS
100.0000 mg | ORAL_TABLET | Freq: Two times a day (BID) | ORAL | 0 refills | Status: DC
  Filled 2023-11-13: qty 14, 7d supply, fill #0

## 2023-11-13 NOTE — Progress Notes (Signed)
 Post Operative Evaluation    Procedure/Date of Surgery: Right knee medial unicompartmental knee arthroplasty 9/28  Interval History:    Presents 3 weeks status post above procedure.  Unfortunately she has been having some swelling and drainage the last several days.  We did place her on a short course of Keflex but this has progressed.   PMH/PSH/Family History/Social History/Meds/Allergies:    Past Medical History:  Diagnosis Date   Anxiety    Arthritis 2018   Bulging lumbar disc 2018   Complication of anesthesia    low blood pressure after retinal detachment surgery   Constipation    COPD (chronic obstructive pulmonary disease) (HCC)    Coronary artery disease    follows up with dr okey prn   Degenerative disc disease, lumbar 2018   Depression    Emphysema of lung (HCC)    Family history of ovarian cancer    mother died of ovarian cancer   GERD (gastroesophageal reflux disease)    History of hiatal hernia    small   History of kidney stones    History of pneumonia    multiple times   History of stomach ulcers yrs ago   Hyperlipidemia    lung ca 05/2003   Multiple lung nodules    followed by dr kerrin ronco 04-10-2020   Osteoarthritis    spine, knees, hands   PONV (postoperative nausea and vomiting)    pt could not eat during hospital stay   Reflux esophagitis    Scoliosis 2018   Shortness of breath    with lifting heavy things   Wears glasses    Past Surgical History:  Procedure Laterality Date   BREAST EXCISIONAL BIOPSY Left yrs ago   BREAST EXCISIONAL BIOPSY Left    BREAST EXCISIONAL BIOPSY Right 2015   BREAST LUMPECTOMY WITH NEEDLE LOCALIZATION Right 04/11/2013   Procedure: BREAST LUMPECTOMY WITH NEEDLE LOCALIZATION;  Surgeon: Krystal JINNY Russell, MD;  Location: [facility];  Service: General;  Laterality: Right;   BREAST SURGERY  1992   lt br bx   CHOLECYSTECTOMY  11/11/2011   Procedure:  LAPAROSCOPIC CHOLECYSTECTOMY WITH INTRAOPERATIVE CHOLANGIOGRAM;  Surgeon: Krystal JINNY Russell, MD;  Location: [facility];  Service: General;  Laterality: N/A;   CYSTOSCOPY  01/2009   for evaluation of hematuria   CYSTOSCOPY N/A 04/23/2020   Procedure: CYSTOSCOPY;  Surgeon: Marilynne Rosaline SAILOR, MD;  Location: [facility];  Service: Gynecology;  Laterality: N/A;   CYSTOSCOPY W/ URETERAL STENT PLACEMENT Left 11/16/2019   Procedure: CYSTOSCOPY WITH STENT REPLACEMENT;  Surgeon: Renda Glance, MD;  Location: [facility];  Service: Urology;  Laterality: Left;   DILATION AND CURETTAGE OF UTERUS  yrs ago   x 2   ESOPHAGOGASTRODUODENOSCOPY  yrs ago   EYE SURGERY  yrs ago   catataracts-both   KNEE SURGERY  yrs ago   rt knee torn cartilage   LUNG LOBECTOMY Left 05/2003   PARTIAL KNEE ARTHROPLASTY Right 10/27/2023   Procedure: ARTHROPLASTY, KNEE, UNICOMPARTMENTAL;  Surgeon: Genelle Standing, MD;  Location: [facility];  Service: Orthopedics;  Laterality: Right;  RIGHT KNEE MEDIAL UNICOMPARTMENTAL ARTHROPLASTY   RETINAL DETACHMENT SURGERY  2011   left; x2   SINUS SURGERY WITH INSTATRAK  yrs ago   TUBAL LIGATION  yrs ago   URETEROSCOPY WITH HOLMIUM  LASER LITHOTRIPSY Left 11/29/2019   Procedure: CYSTOSCOPY; LEFT URETEROSCOPY; HOLMIUM LASER LITHOTRIPSY; LEFT URETERAL STENT EXCHANGE;  Surgeon: Nieves Cough, MD;  Location: [facility];  Service: Urology;  Laterality: Left;  ONLY NEEDS 60 MIN   VAGINAL HYSTERECTOMY N/A 04/23/2020   Procedure: HYSTERECTOMY VAGINAL with bilateral salpingooopherectomy;;  Surgeon: Cleotilde Ronal RAMAN, MD;  Location: [facility];  Service: Gynecology;  Laterality: N/A;  Dr Cleotilde to perform; total time requested for all procedures is 3 hours   VAGINAL PROLAPSE REPAIR N/A 04/23/2020   Procedure: VAGINAL VAULT SUSPENSION;  Surgeon: Marilynne Rosaline SAILOR, MD;  Location: [facility];  Service: Gynecology;  Laterality: N/A;   VIDEO BRONCHOSCOPY WITH ENDOBRONCHIAL  NAVIGATION N/A 01/15/2015   Procedure: VIDEO BRONCHOSCOPY WITH ENDOBRONCHIAL NAVIGATION;  Surgeon: Elspeth JAYSON Millers, MD;  Location: [facility];  Service: Thoracic;  Laterality: N/A;   VOCAL CORD LATERALIZATION, ENDOSCOPIC APPROACH W/ MLB  yrs ago   nodule removed   Social History   Socioeconomic History   Marital status: Married    Spouse name: Not on file   Number of children: 2   Years of education: Not on file   Highest education level: Not on file  Occupational History   Occupation: Retired  Tobacco Use   Smoking status: Former    Current packs/day: 0.00    Average packs/day: 2.0 packs/day for 45.6 years (91.3 ttl pk-yrs)    Types: Cigarettes    Start date: 110    Quit date: 09/21/2008    Years since quitting: 15.1   Smokeless tobacco: Never  Vaping Use   Vaping status: Never Used  Substance and Sexual Activity   Alcohol use: Yes    Alcohol/week: 0.0 - 1.0 standard drinks of alcohol    Comment: occ   Drug use: No   Sexual activity: Not Currently    Partners: Male    Birth control/protection: Post-menopausal  Other Topics Concern   Not on file  Social History Narrative   Not on file   Social Drivers of Health   Financial Resource Strain: Not on file  Food Insecurity: Not on file  Transportation Needs: Not on file  Physical Activity: Not on file  Stress: Not on file  Social Connections: Not on file   Family History  Problem Relation Age of Onset   Hypertension Mother    Diabetes Mother    Ovarian cancer Mother 78   Alcohol abuse Father    Allergies  Allergen Reactions   Adhesive [Tape] Other (See Comments)    Burned skin  TAPE WITH BETADINE  was used   Septra [Sulfamethoxazole-Trimethoprim] Hives   Amoxicillin Other (See Comments)    Had a case of thrush   Current Outpatient Medications  Medication Sig Dispense Refill   cephALEXin (KEFLEX) 500 MG capsule Take 1 capsule (500 mg total) by mouth 4 (four) times daily for 3 days. 12 capsule 0    doxycycline (VIBRA-TABS) 100 MG tablet Take 1 tablet (100 mg total) by mouth 2 (two) times daily for 7 days. 14 tablet 0   acetaminophen  (TYLENOL ) 500 MG tablet Take 500 mg by mouth every 8 (eight) hours.     amitriptyline  (ELAVIL ) 100 MG tablet Take 100 mg by mouth at bedtime.     aspirin  EC 325 MG tablet Take 325 mg by mouth daily.     Cholecalciferol (VITAMIN D3 PO) Take 2,000 Units by mouth daily.     clonazePAM (KLONOPIN) 1 MG tablet Take 1 mg by mouth at  bedtime.     fluticasone  (FLONASE) 50 MCG/ACT nasal spray Place 2 sprays into both nostrils daily.     fluticasone -salmeterol (WIXELA INHUB) 250-50 MCG/ACT AEPB Inhale 1 puff into the lungs in the morning and at bedtime. 60 each 11   ibuprofen  (ADVIL ) 800 MG tablet Take 800 mg by mouth every 8 (eight) hours as needed (pain.).     Multiple Vitamin (MULTIVITAMIN WITH MINERALS) TABS tablet Take 1 tablet by mouth daily.     oxyCODONE  (ROXICODONE ) 5 MG immediate release tablet Take 1 tablet (5 mg total) by mouth every 4 (four) hours as needed for severe pain (pain score 7-10) or breakthrough pain. 15 tablet 0   oxyCODONE  (ROXICODONE ) 5 MG immediate release tablet Take 1 tablet (5 mg total) by mouth every 4 (four) hours as needed. 20 tablet 0   Psyllium (METAMUCIL 3 IN 1 DAILY FIBER PO) Take 2 capsules by mouth daily.     rosuvastatin (CRESTOR) 5 MG tablet Take 5 mg by mouth 4 (four) times a week. Mondays through Thursdays.     tiZANidine (ZANAFLEX) 4 MG tablet Take 4 mg by mouth 3 (three) times daily as needed for muscle spasms.     traMADol  (ULTRAM ) 50 MG tablet Take 50 mg by mouth every 12 (twelve) hours as needed (pain.).     traMADol  (ULTRAM ) 50 MG tablet Take 1 tablet (50 mg total) by mouth every 6 (six) hours as needed. 30 tablet 0   No current facility-administered medications for this visit.   No results found.  Review of Systems:   A ROS was performed including pertinent positives and negatives as documented in the  HPI.   Musculoskeletal Exam:    Right knee incision does appear to have subcutaneous swelling in the prepatellar region with a fluid collection.  She can move the knee from 0-90 without significant pain.  Imaging:    3 views right knee: Status post right knee unicompartmental knee arthroplasty without evidence of complication  I personally reviewed and interpreted the radiographs.   Assessment:   3 weeks status post right medial unicompartmental knee arthroplasty with evidence of a superficial infection today.  Given this I have recommended aspiration with ultrasound guidance at the bedside.  I will plan to keep her on Keflex as well as add doxycycline for an additional week.  I was able to aspirate hematoma with some purulence again superficially approximately 10 cc.  At this time there is not appear to be evidence of involvement of the joint given her range of motion.  I will plan to see her back this following Monday to continue to check her clinical exam and we will have a low threshold for a surgical washout  Plan :    - Return to clinic following Monday       I personally saw and evaluated the patient, and participated in the management and treatment plan.  Elspeth Parker, MD Attending Physician, Orthopedic Surgery  This document was dictated using Dragon voice recognition software. A reasonable attempt at proof reading has been made to minimize errors.

## 2023-11-16 ENCOUNTER — Encounter (HOSPITAL_COMMUNITY): Payer: Self-pay | Admitting: Orthopaedic Surgery

## 2023-11-16 ENCOUNTER — Encounter

## 2023-11-16 ENCOUNTER — Other Ambulatory Visit (HOSPITAL_BASED_OUTPATIENT_CLINIC_OR_DEPARTMENT_OTHER): Payer: Self-pay

## 2023-11-16 ENCOUNTER — Ambulatory Visit (HOSPITAL_BASED_OUTPATIENT_CLINIC_OR_DEPARTMENT_OTHER): Admitting: Pulmonary Disease

## 2023-11-16 ENCOUNTER — Ambulatory Visit (INDEPENDENT_AMBULATORY_CARE_PROVIDER_SITE_OTHER): Admitting: Orthopaedic Surgery

## 2023-11-16 ENCOUNTER — Ambulatory Visit (HOSPITAL_BASED_OUTPATIENT_CLINIC_OR_DEPARTMENT_OTHER): Payer: Self-pay | Admitting: Orthopaedic Surgery

## 2023-11-16 ENCOUNTER — Other Ambulatory Visit: Payer: Self-pay

## 2023-11-16 DIAGNOSIS — L089 Local infection of the skin and subcutaneous tissue, unspecified: Secondary | ICD-10-CM

## 2023-11-16 MED ORDER — OXYCODONE HCL 5 MG PO TABS
5.0000 mg | ORAL_TABLET | ORAL | 0 refills | Status: DC | PRN
  Filled 2023-11-16: qty 40, 7d supply, fill #0

## 2023-11-16 MED ORDER — ASPIRIN 325 MG PO TBEC
325.0000 mg | DELAYED_RELEASE_TABLET | Freq: Every day | ORAL | 0 refills | Status: DC
  Filled 2023-11-16: qty 14, 14d supply, fill #0

## 2023-11-16 NOTE — Progress Notes (Signed)
 Post Operative Evaluation    Procedure/Date of Surgery: Right knee medial unicompartmental knee arthroplasty 9/28  Interval History:    Presents 3 weeks status post above procedure.  Unfortunately she has been having some swelling and drainage the last several days.  She has had some additional drainage since her most recent wound check last Friday.   PMH/PSH/Family History/Social History/Meds/Allergies:    Past Medical History:  Diagnosis Date   Anxiety    Arthritis 2018   Bulging lumbar disc 2018   Complication of anesthesia    low blood pressure after retinal detachment surgery   Constipation    COPD (chronic obstructive pulmonary disease) (HCC)    Coronary artery disease    follows up with dr okey prn   Degenerative disc disease, lumbar 2018   Depression    Emphysema of lung (HCC)    Family history of ovarian cancer    mother died of ovarian cancer   GERD (gastroesophageal reflux disease)    History of hiatal hernia    small   History of kidney stones    History of pneumonia    multiple times   History of stomach ulcers yrs ago   Hyperlipidemia    lung ca 05/2003   Multiple lung nodules    followed by dr kerrin ronco 04-10-2020   Osteoarthritis    spine, knees, hands   PONV (postoperative nausea and vomiting)    pt could not eat during hospital stay   Reflux esophagitis    Scoliosis 2018   Shortness of breath    with lifting heavy things   Wears glasses    Past Surgical History:  Procedure Laterality Date   BREAST EXCISIONAL BIOPSY Left yrs ago   BREAST EXCISIONAL BIOPSY Left    BREAST EXCISIONAL BIOPSY Right 2015   BREAST LUMPECTOMY WITH NEEDLE LOCALIZATION Right 04/11/2013   Procedure: BREAST LUMPECTOMY WITH NEEDLE LOCALIZATION;  Surgeon: Krystal JINNY Russell, MD;  Location: [facility];  Service: General;  Laterality: Right;   BREAST SURGERY  1992   lt br bx   CHOLECYSTECTOMY  11/11/2011    Procedure: LAPAROSCOPIC CHOLECYSTECTOMY WITH INTRAOPERATIVE CHOLANGIOGRAM;  Surgeon: Krystal JINNY Russell, MD;  Location: [facility] OR;  Service: General;  Laterality: N/A;   CYSTOSCOPY  01/2009   for evaluation of hematuria   CYSTOSCOPY N/A 04/23/2020   Procedure: CYSTOSCOPY;  Surgeon: Marilynne Rosaline SAILOR, MD;  Location: [facility];  Service: Gynecology;  Laterality: N/A;   CYSTOSCOPY W/ URETERAL STENT PLACEMENT Left 11/16/2019   Procedure: CYSTOSCOPY WITH STENT REPLACEMENT;  Surgeon: Renda Glance, MD;  Location: [facility];  Service: Urology;  Laterality: Left;   DILATION AND CURETTAGE OF UTERUS  yrs ago   x 2   ESOPHAGOGASTRODUODENOSCOPY  yrs ago   EYE SURGERY  yrs ago   catataracts-both   KNEE SURGERY  yrs ago   rt knee torn cartilage   LUNG LOBECTOMY Left 05/2003   PARTIAL KNEE ARTHROPLASTY Right 10/27/2023   Procedure: ARTHROPLASTY, KNEE, UNICOMPARTMENTAL;  Surgeon: Genelle Standing, MD;  Location: [facility] OR;  Service: Orthopedics;  Laterality: Right;  RIGHT KNEE MEDIAL UNICOMPARTMENTAL ARTHROPLASTY   RETINAL DETACHMENT SURGERY  2011   left; x2   SINUS SURGERY WITH INSTATRAK  yrs ago   TUBAL LIGATION  yrs ago   URETEROSCOPY WITH HOLMIUM  LASER LITHOTRIPSY Left 11/29/2019   Procedure: CYSTOSCOPY; LEFT URETEROSCOPY; HOLMIUM LASER LITHOTRIPSY; LEFT URETERAL STENT EXCHANGE;  Surgeon: Nieves Cough, MD;  Location: [facility];  Service: Urology;  Laterality: Left;  ONLY NEEDS 60 MIN   VAGINAL HYSTERECTOMY N/A 04/23/2020   Procedure: HYSTERECTOMY VAGINAL with bilateral salpingooopherectomy;;  Surgeon: Cleotilde Ronal RAMAN, MD;  Location: [facility];  Service: Gynecology;  Laterality: N/A;  Dr Cleotilde to perform; total time requested for all procedures is 3 hours   VAGINAL PROLAPSE REPAIR N/A 04/23/2020   Procedure: VAGINAL VAULT SUSPENSION;  Surgeon: Marilynne Rosaline SAILOR, MD;  Location: [facility];  Service: Gynecology;  Laterality: N/A;   VIDEO BRONCHOSCOPY WITH  ENDOBRONCHIAL NAVIGATION N/A 01/15/2015   Procedure: VIDEO BRONCHOSCOPY WITH ENDOBRONCHIAL NAVIGATION;  Surgeon: Elspeth JAYSON Millers, MD;  Location: [facility] OR;  Service: Thoracic;  Laterality: N/A;   VOCAL CORD LATERALIZATION, ENDOSCOPIC APPROACH W/ MLB  yrs ago   nodule removed   Social History   Socioeconomic History   Marital status: Married    Spouse name: Not on file   Number of children: 2   Years of education: Not on file   Highest education level: Not on file  Occupational History   Occupation: Retired  Tobacco Use   Smoking status: Former    Current packs/day: 0.00    Average packs/day: 2.0 packs/day for 45.6 years (91.3 ttl pk-yrs)    Types: Cigarettes    Start date: 58    Quit date: 09/21/2008    Years since quitting: 15.1   Smokeless tobacco: Never  Vaping Use   Vaping status: Never Used  Substance and Sexual Activity   Alcohol use: Yes    Alcohol/week: 0.0 - 1.0 standard drinks of alcohol    Comment: occ   Drug use: No   Sexual activity: Not Currently    Partners: Male    Birth control/protection: Post-menopausal  Other Topics Concern   Not on file  Social History Narrative   Not on file   Social Drivers of Health   Financial Resource Strain: Not on file  Food Insecurity: Not on file  Transportation Needs: Not on file  Physical Activity: Not on file  Stress: Not on file  Social Connections: Not on file   Family History  Problem Relation Age of Onset   Hypertension Mother    Diabetes Mother    Ovarian cancer Mother 2   Alcohol abuse Father    Allergies  Allergen Reactions   Adhesive [Tape] Other (See Comments)    Burned skin  TAPE WITH BETADINE  was used   Septra [Sulfamethoxazole-Trimethoprim] Hives   Amoxicillin Other (See Comments)    Had a case of thrush   Current Outpatient Medications  Medication Sig Dispense Refill   aspirin  EC 325 MG tablet Take 1 tablet (325 mg total) by mouth daily. 14 tablet 0   oxyCODONE  (ROXICODONE ) 5 MG  immediate release tablet Take 1 tablet (5 mg total) by mouth every 4 (four) hours as needed for severe pain (pain score 7-10) or breakthrough pain. 40 tablet 0   acetaminophen  (TYLENOL ) 500 MG tablet Take 500 mg by mouth every 8 (eight) hours.     amitriptyline  (ELAVIL ) 100 MG tablet Take 100 mg by mouth at bedtime.     aspirin  EC 325 MG tablet Take 325 mg by mouth daily.     Cholecalciferol (VITAMIN D3 PO) Take 2,000 Units by mouth daily.     clonazePAM (KLONOPIN) 1 MG tablet Take 1  mg by mouth at bedtime.     doxycycline (VIBRA-TABS) 100 MG tablet Take 1 tablet (100 mg total) by mouth 2 (two) times daily for 7 days. 14 tablet 0   fluticasone  (FLONASE) 50 MCG/ACT nasal spray Place 2 sprays into both nostrils daily.     fluticasone -salmeterol (WIXELA INHUB) 250-50 MCG/ACT AEPB Inhale 1 puff into the lungs in the morning and at bedtime. 60 each 11   ibuprofen  (ADVIL ) 800 MG tablet Take 800 mg by mouth every 8 (eight) hours as needed (pain.).     Multiple Vitamin (MULTIVITAMIN WITH MINERALS) TABS tablet Take 1 tablet by mouth daily.     oxyCODONE  (ROXICODONE ) 5 MG immediate release tablet Take 1 tablet (5 mg total) by mouth every 4 (four) hours as needed for severe pain (pain score 7-10) or breakthrough pain. 15 tablet 0   Psyllium (METAMUCIL 3 IN 1 DAILY FIBER PO) Take 2 capsules by mouth daily.     rosuvastatin (CRESTOR) 5 MG tablet Take 5 mg by mouth 4 (four) times a week. Mondays through Thursdays.     tiZANidine (ZANAFLEX) 4 MG tablet Take 4 mg by mouth 3 (three) times daily as needed for muscle spasms.     traMADol  (ULTRAM ) 50 MG tablet Take 50 mg by mouth every 12 (twelve) hours as needed (pain.).     traMADol  (ULTRAM ) 50 MG tablet Take 1 tablet (50 mg total) by mouth every 6 (six) hours as needed. 30 tablet 0   No current facility-administered medications for this visit.   No results found.  Review of Systems:   A ROS was performed including pertinent positives and negatives as  documented in the HPI.   Musculoskeletal Exam:    Right knee incision does appear to have subcutaneous swelling in the prepatellar region with a fluid collection.  She can move the knee from 0-90 without significant pain.  Imaging:    3 views right knee: Status post right knee unicompartmental knee arthroplasty without evidence of complication  I personally reviewed and interpreted the radiographs.   Assessment:   3 weeks status post right medial unicompartmental knee arthroplasty with evidence of infection.  At this time I have concern for possible prosthetic joint infection as well as will motion is decreased.  At this time I have recommended an acute surgical debridement with possible polyethylene exchange in order to promote clearance of this infection.  We would also take cultures at that time plan for referral to infectious disease.  Plan :    - Plan for return to the OR for right knee irrigation and debridement with polyethylene exchange   After a lengthy discussion of treatment options, including risks, benefits, alternatives, complications of surgical and nonsurgical conservative options, the patient elected surgical repair.   The patient  is aware of the material risks  and complications including, but not limited to injury to adjacent structures, neurovascular injury, infection, numbness, bleeding, implant failure, thermal burns, stiffness, persistent pain, failure to heal, disease transmission from allograft, need for further surgery, dislocation, anesthetic risks, blood clots, risks of death,and others. The probabilities of surgical success and failure discussed with patient given their particular co-morbidities.The time and nature of expected rehabilitation and recovery was discussed.The patient's questions were all answered preoperatively.  No barriers to understanding were noted. I explained the natural history of the disease process and Rx rationale.  I explained to the  patient what I considered to be reasonable expectations given their personal situation.  The final treatment plan  was arrived at through a shared patient decision making process model.        I personally saw and evaluated the patient, and participated in the management and treatment plan.  Elspeth Parker, MD Attending Physician, Orthopedic Surgery  This document was dictated using Dragon voice recognition software. A reasonable attempt at proof reading has been made to minimize errors.

## 2023-11-16 NOTE — Anesthesia Preprocedure Evaluation (Signed)
 Anesthesia Evaluation  Patient identified by MRN, date of birth, ID band Patient awake    Reviewed: Allergy & Precautions, NPO status , Patient's Chart, lab work & pertinent test results  History of Anesthesia Complications (+) PONV and history of anesthetic complications  Airway Mallampati: III  TM Distance: >3 FB Neck ROM: Full   Comment: Previous grade I view with [facility] Dental  (+) Dental Advisory Given   Pulmonary neg shortness of breath, neg sleep apnea, COPD,  COPD inhaler, neg recent URI, former smoker Multiple lung nodules, h/o lung cancer   breath sounds clear to auscultation       Cardiovascular (-) hypertension(-) angina + CAD  (-) Past MI, (-) Cardiac Stents and (-) CABG (-) dysrhythmias  Rhythm:Regular  HLD  Low-risk stress test 01/21/2019  TTE 01/12/2019: IMPRESSIONS    1. Left ventricular ejection fraction, by visual estimation, is 55 to  60%. The left ventricle has normal function. There is no left ventricular  hypertrophy.   2. Left ventricular diastolic parameters are consistent with Grade I  diastolic dysfunction (impaired relaxation).   3. The left ventricle has no regional wall motion abnormalities.   4. Global right ventricle has normal systolic function.The right  ventricular size is normal. No increase in right ventricular wall  thickness.   5. Left atrial size was normal.   6. Right atrial size was normal.   7. The mitral valve is normal in structure. Trivial mitral valve  regurgitation. No evidence of mitral stenosis.   8. The tricuspid valve is normal in structure. Tricuspid valve  regurgitation is trivial.   9. The aortic valve is tricuspid. Aortic valve regurgitation is trivial.  Mild aortic valve sclerosis without stenosis.  10. The pulmonic valve was normal in structure. Pulmonic valve  regurgitation is trivial.  11. Normal pulmonary artery systolic pressure.  12. The inferior vena cava  is normal in size with greater than 50%  respiratory variability, suggesting right atrial pressure of 3 mmHg.     Neuro/Psych  PSYCHIATRIC DISORDERS Anxiety Depression    negative neurological ROS     GI/Hepatic Neg liver ROS, hiatal hernia, PUD,GERD  ,,  Endo/Other  negative endocrine ROS    Renal/GU Renal disease (stone)Lab Results      Component                Value               Date                      NA                       141                 04/19/2020                K                        3.8                 04/19/2020                CO2                      24                  04/19/2020  GLUCOSE                  104 (H)             04/19/2020                BUN                      17                  04/19/2020                CREATININE               0.68                04/19/2020                CALCIUM                  9.0                 04/19/2020                EGFR                     81 (L)              10/29/2016                GFRNONAA                 >60                 04/19/2020                Musculoskeletal  (+) Arthritis , Osteoarthritis,  Scoliosis    Abdominal   Peds  Hematology Lab Results      Component                Value               Date                      WBC                      7.2                 10/23/2023                HGB                      14.8                10/23/2023                HCT                      46.7 (H)            10/23/2023                MCV                      92.3                10/23/2023                PLT  244                 10/23/2023              Anesthesia Other Findings   Reproductive/Obstetrics                              Anesthesia Physical Anesthesia Plan  ASA: 3  Anesthesia Plan: MAC and Spinal   Post-op Pain Management: Regional block* and Tylenol  PO (pre-op)*   Induction: Intravenous  PONV Risk Score and Plan: 3 and  Ondansetron , Propofol  infusion, TIVA and Treatment may vary due to age or medical condition  Airway Management Planned: Natural Airway, Simple Face Mask and Nasal Cannula  Additional Equipment: None  Intra-op Plan:   Post-operative Plan:   Informed Consent: I have reviewed the patients History and Physical, chart, labs and discussed the procedure including the risks, benefits and alternatives for the proposed anesthesia with the patient or authorized representative who has indicated his/her understanding and acceptance.     Dental advisory given  Plan Discussed with: CRNA and Anesthesiologist  Anesthesia Plan Comments: (See previously PAT note written by Glendel Jaggers, PA-C for Date of Service 10/23/2023. S/p right uni-knee 10/27/2023. )         Anesthesia Quick Evaluation

## 2023-11-16 NOTE — Progress Notes (Signed)
 SDW call  Patient was given pre-op instructions over the phone. Patient verbalized understanding of instructions provided.     PCP - Dr. Lamarr Doing Cardiologist - Dr. Vina Roxx Pulmonary: Dr. Acquanetta Staff, LOV 04/16/2023   PPM/ICD - denies Device Orders - na Rep Notified - na   Chest x-ray - na EKG -  10/23/2023 Stress Test -01/21/2019 ECHO - 01/12/2019 Cardiac Cath -  PFT: 02/07/2019  Sleep Study/sleep apnea/CPAP: denies  Non-diabetic  Blood Thinner Instructions: denies Aspirin  Instructions:continue   ERAS Protcol - Clears until 1315   Anesthesia review: Yes. Lung CA, emphysema, CAD, COPD, post-op infection   Patient denies shortness of breath, fever, cough and chest pain over the phone call  Your procedure is scheduled on Tuesday November 17, 2023  Report to [facility] Main Entrance A at  1345 PM., then check in with the Admitting office.  Call this number if you have problems the morning of surgery:  [phone]   If you have any questions prior to your surgery date call [phone]: Open Monday-Friday 8am-4pm If you experience any cold or flu symptoms such as cough, fever, chills, shortness of breath, etc. between now and your scheduled surgery, please notify us  at the above number    Remember:  Do not eat after midnight the night before your surgery  You may drink clear liquids until  1315 the day of your surgery.   Clear liquids allowed are: Water , Non-Citrus Juices (without pulp), Carbonated Beverages, Clear Tea, Black Coffee ONLY (NO MILK, CREAM OR POWDERED CREAMER of any kind), and Gatorade   Take these medicines the morning of surgery with A SIP OF WATER :  ASA, doxycycline, flonase, wixela inhaler, crestor  As needed: Oxycodone , zanaflex, tramadol   As of today, STOP taking any Aleve, Naproxen, Ibuprofen , Motrin , Advil , Goody's, BC's, all herbal medications, fish oil, and all vitamins.

## 2023-11-17 ENCOUNTER — Encounter (HOSPITAL_COMMUNITY): Payer: Self-pay | Admitting: Orthopaedic Surgery

## 2023-11-17 ENCOUNTER — Inpatient Hospital Stay (HOSPITAL_COMMUNITY): Payer: Self-pay | Admitting: Vascular Surgery

## 2023-11-17 ENCOUNTER — Inpatient Hospital Stay (HOSPITAL_COMMUNITY)
Admission: AD | Admit: 2023-11-17 | Discharge: 2023-12-05 | DRG: 487 | Disposition: E | Attending: Orthopaedic Surgery | Admitting: Orthopaedic Surgery

## 2023-11-17 ENCOUNTER — Encounter (HOSPITAL_COMMUNITY): Admission: AD | Disposition: E | Payer: Self-pay | Source: Home / Self Care | Attending: Orthopaedic Surgery

## 2023-11-17 ENCOUNTER — Other Ambulatory Visit: Payer: Self-pay

## 2023-11-17 DIAGNOSIS — I469 Cardiac arrest, cause unspecified: Secondary | ICD-10-CM | POA: Diagnosis not present

## 2023-11-17 DIAGNOSIS — Y831 Surgical operation with implant of artificial internal device as the cause of abnormal reaction of the patient, or of later complication, without mention of misadventure at the time of the procedure: Secondary | ICD-10-CM | POA: Diagnosis not present

## 2023-11-17 DIAGNOSIS — E785 Hyperlipidemia, unspecified: Secondary | ICD-10-CM | POA: Diagnosis not present

## 2023-11-17 DIAGNOSIS — Z833 Family history of diabetes mellitus: Secondary | ICD-10-CM

## 2023-11-17 DIAGNOSIS — I2510 Atherosclerotic heart disease of native coronary artery without angina pectoris: Secondary | ICD-10-CM | POA: Diagnosis not present

## 2023-11-17 DIAGNOSIS — Z8711 Personal history of peptic ulcer disease: Secondary | ICD-10-CM | POA: Diagnosis not present

## 2023-11-17 DIAGNOSIS — Z7982 Long term (current) use of aspirin: Secondary | ICD-10-CM | POA: Diagnosis not present

## 2023-11-17 DIAGNOSIS — M00869 Arthritis due to other bacteria, unspecified knee: Secondary | ICD-10-CM

## 2023-11-17 DIAGNOSIS — Z87891 Personal history of nicotine dependence: Secondary | ICD-10-CM

## 2023-11-17 DIAGNOSIS — Z8041 Family history of malignant neoplasm of ovary: Secondary | ICD-10-CM

## 2023-11-17 DIAGNOSIS — T8453XA Infection and inflammatory reaction due to internal right knee prosthesis, initial encounter: Principal | ICD-10-CM | POA: Diagnosis present

## 2023-11-17 DIAGNOSIS — Z91048 Other nonmedicinal substance allergy status: Secondary | ICD-10-CM

## 2023-11-17 DIAGNOSIS — L089 Local infection of the skin and subcutaneous tissue, unspecified: Secondary | ICD-10-CM | POA: Diagnosis not present

## 2023-11-17 DIAGNOSIS — I959 Hypotension, unspecified: Secondary | ICD-10-CM | POA: Diagnosis not present

## 2023-11-17 DIAGNOSIS — Z79899 Other long term (current) drug therapy: Secondary | ICD-10-CM | POA: Diagnosis not present

## 2023-11-17 DIAGNOSIS — Z881 Allergy status to other antibiotic agents status: Secondary | ICD-10-CM

## 2023-11-17 DIAGNOSIS — Z8701 Personal history of pneumonia (recurrent): Secondary | ICD-10-CM

## 2023-11-17 DIAGNOSIS — Z87442 Personal history of urinary calculi: Secondary | ICD-10-CM

## 2023-11-17 DIAGNOSIS — Z85118 Personal history of other malignant neoplasm of bronchus and lung: Secondary | ICD-10-CM | POA: Diagnosis not present

## 2023-11-17 DIAGNOSIS — Z811 Family history of alcohol abuse and dependence: Secondary | ICD-10-CM | POA: Diagnosis not present

## 2023-11-17 DIAGNOSIS — G8918 Other acute postprocedural pain: Secondary | ICD-10-CM | POA: Diagnosis not present

## 2023-11-17 DIAGNOSIS — J439 Emphysema, unspecified: Secondary | ICD-10-CM | POA: Diagnosis not present

## 2023-11-17 DIAGNOSIS — Z8249 Family history of ischemic heart disease and other diseases of the circulatory system: Secondary | ICD-10-CM

## 2023-11-17 DIAGNOSIS — J449 Chronic obstructive pulmonary disease, unspecified: Secondary | ICD-10-CM

## 2023-11-17 DIAGNOSIS — T8450XA Infection and inflammatory reaction due to unspecified internal joint prosthesis, initial encounter: Principal | ICD-10-CM | POA: Diagnosis present

## 2023-11-17 DIAGNOSIS — R0602 Shortness of breath: Secondary | ICD-10-CM | POA: Diagnosis not present

## 2023-11-17 HISTORY — PX: CONVERSION TO TOTAL KNEE: SHX5785

## 2023-11-17 SURGERY — CONVERSION, ARTHROPLASTY, KNEE, PARTIAL, TO TOTAL KNEE ARTHROPLASTY
Anesthesia: Monitor Anesthesia Care | Site: Knee | Laterality: Right

## 2023-11-17 MED ORDER — SODIUM CHLORIDE 0.9 % IR SOLN
Status: DC | PRN
  Administered 2023-11-17: 1000 mL

## 2023-11-17 MED ORDER — FENTANYL CITRATE (PF) 100 MCG/2ML IJ SOLN
INTRAMUSCULAR | Status: DC | PRN
  Administered 2023-11-17: 50 ug via INTRAVENOUS
  Administered 2023-11-17 (×2): 25 ug via INTRAVENOUS

## 2023-11-17 MED ORDER — ACETAMINOPHEN 10 MG/ML IV SOLN: 1000.0000 mg | Freq: Once | INTRAVENOUS | Status: DC | PRN

## 2023-11-17 MED ORDER — FLUTICASONE PROPIONATE 50 MCG/ACT NA SUSP
2.0000 | Freq: Every day | NASAL | Status: DC
  Filled 2023-11-17: qty 16

## 2023-11-17 MED ORDER — PHENYLEPHRINE HCL-NACL 20-0.9 MG/250ML-% IV SOLN
INTRAVENOUS | Status: DC | PRN
  Administered 2023-11-17: 20 ug/min via INTRAVENOUS

## 2023-11-17 MED ORDER — TRANEXAMIC ACID-NACL 1000-0.7 MG/100ML-% IV SOLN
1000.0000 mg | INTRAVENOUS | Status: AC
  Administered 2023-11-17: 1000 mg via INTRAVENOUS
  Filled 2023-11-17: qty 100

## 2023-11-17 MED ORDER — HYDROMORPHONE HCL 1 MG/ML IJ SOLN: 0.2500 mg | INTRAMUSCULAR | Status: DC | PRN

## 2023-11-17 MED ORDER — CLONAZEPAM 0.5 MG PO TABS
1.0000 mg | ORAL_TABLET | Freq: Every day | ORAL | Status: DC
  Administered 2023-11-17 – 2023-11-18 (×2): 1 mg via ORAL
  Filled 2023-11-17 (×2): qty 2

## 2023-11-17 MED ORDER — ONDANSETRON HCL 4 MG/2ML IJ SOLN: 4.0000 mg | Freq: Four times a day (QID) | INTRAMUSCULAR | Status: DC | PRN

## 2023-11-17 MED ORDER — PROPOFOL 10 MG/ML IV BOLUS
INTRAVENOUS | Status: AC
  Filled 2023-11-17: qty 20

## 2023-11-17 MED ORDER — BUPIVACAINE-EPINEPHRINE (PF) 0.25% -1:200000 IJ SOLN
INTRAMUSCULAR | Status: AC
  Filled 2023-11-17: qty 30

## 2023-11-17 MED ORDER — LACTATED RINGERS IV SOLN: INTRAVENOUS | Status: DC

## 2023-11-17 MED ORDER — DOXYCYCLINE HYCLATE 100 MG PO TABS
100.0000 mg | ORAL_TABLET | Freq: Two times a day (BID) | ORAL | Status: DC
Start: 2023-11-17 — End: 2023-11-20
  Administered 2023-11-17 – 2023-11-18 (×2): 100 mg via ORAL
  Filled 2023-11-17 (×2): qty 1

## 2023-11-17 MED ORDER — OXYCODONE HCL 5 MG PO TABS
5.0000 mg | ORAL_TABLET | ORAL | Status: DC | PRN
  Filled 2023-11-17: qty 2
  Filled 2023-11-17: qty 1

## 2023-11-17 MED ORDER — ORAL CARE MOUTH RINSE: 15.0000 mL | Freq: Once | OROMUCOSAL | Status: AC

## 2023-11-17 MED ORDER — BUPIVACAINE-EPINEPHRINE (PF) 0.5% -1:200000 IJ SOLN
INTRAMUSCULAR | Status: DC | PRN
  Administered 2023-11-17: 15 mL via PERINEURAL

## 2023-11-17 MED ORDER — OXYCODONE HCL 5 MG PO TABS
10.0000 mg | ORAL_TABLET | ORAL | Status: DC | PRN
  Administered 2023-11-18 (×3): 15 mg via ORAL
  Filled 2023-11-17 (×3): qty 3

## 2023-11-17 MED ORDER — FLUTICASONE FUROATE-VILANTEROL 200-25 MCG/ACT IN AEPB
1.0000 | INHALATION_SPRAY | Freq: Every day | RESPIRATORY_TRACT | Status: DC
  Administered 2023-11-18: 1 via RESPIRATORY_TRACT
  Filled 2023-11-17: qty 28

## 2023-11-17 MED ORDER — BUPIVACAINE IN DEXTROSE 0.75-8.25 % IT SOLN
INTRATHECAL | Status: DC | PRN
  Administered 2023-11-17: 1.8 mL via INTRATHECAL

## 2023-11-17 MED ORDER — VANCOMYCIN HCL 1000 MG IV SOLR
INTRAVENOUS | Status: AC
  Filled 2023-11-17: qty 20

## 2023-11-17 MED ORDER — PROPOFOL 10 MG/ML IV BOLUS
INTRAVENOUS | Status: DC | PRN
Start: 2023-11-17 — End: 2023-11-17
  Administered 2023-11-17 (×2): 20 mg via INTRAVENOUS

## 2023-11-17 MED ORDER — TIZANIDINE HCL 4 MG PO TABS
4.0000 mg | ORAL_TABLET | Freq: Three times a day (TID) | ORAL | Status: DC | PRN
Start: 2023-11-17 — End: 2023-11-19
  Administered 2023-11-17 – 2023-11-18 (×3): 4 mg via ORAL
  Filled 2023-11-17 (×3): qty 1

## 2023-11-17 MED ORDER — ONDANSETRON HCL 4 MG/2ML IJ SOLN
INTRAMUSCULAR | Status: DC | PRN
  Administered 2023-11-17: 4 mg via INTRAVENOUS

## 2023-11-17 MED ORDER — VANCOMYCIN HCL 1000 MG IV SOLR
INTRAVENOUS | Status: DC | PRN
  Administered 2023-11-17: 1000 mg

## 2023-11-17 MED ORDER — ACETAMINOPHEN 500 MG PO TABS
1000.0000 mg | ORAL_TABLET | Freq: Once | ORAL | Status: AC
  Administered 2023-11-17: 1000 mg via ORAL
  Filled 2023-11-17: qty 2

## 2023-11-17 MED ORDER — VITAMIN D 25 MCG (1000 UNIT) PO TABS
2000.0000 [IU] | ORAL_TABLET | Freq: Every day | ORAL | Status: DC
  Filled 2023-11-17 (×2): qty 2

## 2023-11-17 MED ORDER — PHENYLEPHRINE 80 MCG/ML (10ML) SYRINGE FOR IV PUSH (FOR BLOOD PRESSURE SUPPORT)
PREFILLED_SYRINGE | INTRAVENOUS | Status: DC | PRN
  Administered 2023-11-17 (×3): 80 ug via INTRAVENOUS

## 2023-11-17 MED ORDER — MIDAZOLAM HCL 2 MG/2ML IJ SOLN
INTRAMUSCULAR | Status: AC
  Filled 2023-11-17: qty 2

## 2023-11-17 MED ORDER — CEFAZOLIN SODIUM-DEXTROSE 2-4 GM/100ML-% IV SOLN
2.0000 g | INTRAVENOUS | Status: AC
  Administered 2023-11-17: 2 g via INTRAVENOUS
  Filled 2023-11-17: qty 100

## 2023-11-17 MED ORDER — OXYCODONE HCL 5 MG/5ML PO SOLN: 5.0000 mg | Freq: Once | ORAL | Status: DC | PRN

## 2023-11-17 MED ORDER — LIDOCAINE-EPINEPHRINE (PF) 1.5 %-1:200000 IJ SOLN
INTRAMUSCULAR | Status: DC | PRN
  Administered 2023-11-17: 5 mL via PERINEURAL

## 2023-11-17 MED ORDER — PROPOFOL 500 MG/50ML IV EMUL
INTRAVENOUS | Status: DC | PRN
  Administered 2023-11-17: 50 ug/kg/min via INTRAVENOUS

## 2023-11-17 MED ORDER — ACETAMINOPHEN 325 MG PO TABS
325.0000 mg | ORAL_TABLET | Freq: Four times a day (QID) | ORAL | Status: DC | PRN
  Administered 2023-11-18: 650 mg via ORAL
  Filled 2023-11-17: qty 2

## 2023-11-17 MED ORDER — SODIUM CHLORIDE 0.9 % IV SOLN: INTRAVENOUS | Status: DC

## 2023-11-17 MED ORDER — HYDROMORPHONE HCL 1 MG/ML IJ SOLN
0.5000 mg | INTRAMUSCULAR | Status: DC | PRN
  Administered 2023-11-17 – 2023-11-18 (×2): 1 mg via INTRAVENOUS
  Administered 2023-11-18 (×2): 0.5 mg via INTRAVENOUS
  Administered 2023-11-18: 1 mg via INTRAVENOUS
  Filled 2023-11-17 (×6): qty 1

## 2023-11-17 MED ORDER — 0.9 % SODIUM CHLORIDE (POUR BTL) OPTIME
TOPICAL | Status: DC | PRN
  Administered 2023-11-17: 1000 mL

## 2023-11-17 MED ORDER — ONDANSETRON HCL 4 MG PO TABS: 4.0000 mg | ORAL_TABLET | Freq: Four times a day (QID) | ORAL | Status: DC | PRN

## 2023-11-17 MED ORDER — GABAPENTIN 300 MG PO CAPS
300.0000 mg | ORAL_CAPSULE | Freq: Once | ORAL | Status: DC
  Filled 2023-11-17: qty 1

## 2023-11-17 MED ORDER — ASPIRIN 325 MG PO TABS
325.0000 mg | ORAL_TABLET | Freq: Every day | ORAL | Status: DC
  Administered 2023-11-17 – 2023-11-18 (×2): 325 mg via ORAL
  Filled 2023-11-17 (×2): qty 1

## 2023-11-17 MED ORDER — OXYCODONE HCL 5 MG PO TABS: 5.0000 mg | ORAL_TABLET | Freq: Once | ORAL | Status: DC | PRN

## 2023-11-17 MED ORDER — ROSUVASTATIN CALCIUM 5 MG PO TABS
5.0000 mg | ORAL_TABLET | ORAL | Status: DC
  Administered 2023-11-18: 5 mg via ORAL
  Filled 2023-11-17: qty 1

## 2023-11-17 MED ORDER — CHLORHEXIDINE GLUCONATE 0.12 % MT SOLN
15.0000 mL | Freq: Once | OROMUCOSAL | Status: AC
  Administered 2023-11-17: 15 mL via OROMUCOSAL
  Filled 2023-11-17: qty 15

## 2023-11-17 MED ORDER — MIDAZOLAM HCL 2 MG/2ML IJ SOLN
INTRAMUSCULAR | Status: DC | PRN
  Administered 2023-11-17: 2 mg via INTRAVENOUS

## 2023-11-17 MED ORDER — ADULT MULTIVITAMIN W/MINERALS CH
1.0000 | ORAL_TABLET | Freq: Every day | ORAL | Status: DC
  Filled 2023-11-17 (×2): qty 1

## 2023-11-17 MED ORDER — AMITRIPTYLINE HCL 25 MG PO TABS
100.0000 mg | ORAL_TABLET | Freq: Every day | ORAL | Status: DC
  Administered 2023-11-17 – 2023-11-18 (×2): 100 mg via ORAL
  Filled 2023-11-17 (×2): qty 4

## 2023-11-17 MED ORDER — DOCUSATE SODIUM 100 MG PO CAPS
100.0000 mg | ORAL_CAPSULE | Freq: Two times a day (BID) | ORAL | Status: DC
  Administered 2023-11-17 – 2023-11-18 (×3): 100 mg via ORAL
  Filled 2023-11-17 (×3): qty 1

## 2023-11-17 MED ORDER — ACETAMINOPHEN 500 MG PO TABS
1000.0000 mg | ORAL_TABLET | Freq: Four times a day (QID) | ORAL | Status: AC
  Administered 2023-11-17 (×2): 1000 mg via ORAL
  Filled 2023-11-17 (×3): qty 2

## 2023-11-17 MED ORDER — FENTANYL CITRATE (PF) 100 MCG/2ML IJ SOLN
INTRAMUSCULAR | Status: AC
  Filled 2023-11-17: qty 2

## 2023-11-17 SURGICAL SUPPLY — 68 items
BAG COUNTER SPONGE SURGICOUNT (BAG) ×1 IMPLANT
BANDAGE ESMARK 6X9 LF (GAUZE/BANDAGES/DRESSINGS) ×1 IMPLANT
BLADE SAG 18X100X1.27 (BLADE) ×1 IMPLANT
BNDG COHESIVE 4X5 TAN STRL LF (GAUZE/BANDAGES/DRESSINGS) ×1 IMPLANT
BNDG COHESIVE 6X5 TAN ST LF (GAUZE/BANDAGES/DRESSINGS) ×1 IMPLANT
BNDG ELASTIC 4INX 5YD STR LF (GAUZE/BANDAGES/DRESSINGS) IMPLANT
BNDG ELASTIC 6INX 5YD STR LF (GAUZE/BANDAGES/DRESSINGS) ×2 IMPLANT
BNDG GAUZE DERMACEA FLUFF 4 (GAUZE/BANDAGES/DRESSINGS) ×1 IMPLANT
BOWL SMART MIX CTS (DISPOSABLE) IMPLANT
COOLER ICEMAN CLASSIC (MISCELLANEOUS) ×1 IMPLANT
COVER SURGICAL LIGHT HANDLE (MISCELLANEOUS) ×1 IMPLANT
CUFF TOURN SGL QUICK 42 (TOURNIQUET CUFF) IMPLANT
CUFF TRNQT CYL 34X4.125X (TOURNIQUET CUFF) ×1 IMPLANT
DRAPE C-ARM 42X120 X-RAY (DRAPES) ×1 IMPLANT
DRAPE EXTREMITY T 121X128X90 (DISPOSABLE) ×1 IMPLANT
DRAPE HALF SHEET 40X57 (DRAPES) ×1 IMPLANT
DRAPE OEC MINIVIEW 54X84 (DRAPES) IMPLANT
DRAPE U-SHAPE 47X51 STRL (DRAPES) ×1 IMPLANT
DRSG ADAPTIC 3X8 NADH LF (GAUZE/BANDAGES/DRESSINGS) ×1 IMPLANT
DRSG XEROFORM 1X8 (GAUZE/BANDAGES/DRESSINGS) IMPLANT
DURAPREP 26ML APPLICATOR (WOUND CARE) ×1 IMPLANT
ELECT CAUTERY BLADE 6.4 (BLADE) ×1 IMPLANT
ELECTRODE REM PT RTRN 9FT ADLT (ELECTROSURGICAL) ×1 IMPLANT
FACESHIELD WRAPAROUND OR TEAM (MASK) ×2 IMPLANT
GAUZE PAD ABD 8X10 STRL (GAUZE/BANDAGES/DRESSINGS) ×1 IMPLANT
GAUZE SPONGE 4X4 12PLY STRL (GAUZE/BANDAGES/DRESSINGS) ×1 IMPLANT
GAUZE XEROFORM 1X8 LF (GAUZE/BANDAGES/DRESSINGS) ×1 IMPLANT
GLOVE BIO SURGEON STRL SZ7.5 (GLOVE) ×1 IMPLANT
GLOVE BIOGEL PI IND STRL 6.5 (GLOVE) ×1 IMPLANT
GLOVE BIOGEL PI IND STRL 8 (GLOVE) ×2 IMPLANT
GLOVE ECLIPSE 6.0 STRL STRAW (GLOVE) ×1 IMPLANT
GLOVE INDICATOR 8.0 STRL GRN (GLOVE) ×1 IMPLANT
GLOVE ORTHO TXT STRL SZ7.5 (GLOVE) ×1 IMPLANT
GLOVE SURG ORTHO 8.0 STRL STRW (GLOVE) ×1 IMPLANT
GOWN STRL REUS W/ TWL LRG LVL3 (GOWN DISPOSABLE) ×1 IMPLANT
GOWN STRL REUS W/ TWL XL LVL3 (GOWN DISPOSABLE) ×3 IMPLANT
IMMOBILIZER KNEE 22 UNIV (SOFTGOODS) ×1 IMPLANT
INSERT TIB BEAR KNEE F 8 RT (Insert) IMPLANT
INSERTER TIP PARTIAL KNEE (MISCELLANEOUS) IMPLANT
IV 0.9% NACL 1000 ML (IV SOLUTION) ×1 IMPLANT
KIT BASIN OR (CUSTOM PROCEDURE TRAY) ×1 IMPLANT
KIT TURNOVER KIT B (KITS) ×1 IMPLANT
MANIFOLD NEPTUNE II (INSTRUMENTS) ×1 IMPLANT
NDL 18GX1X1/2 (RX/OR ONLY) (NEEDLE) IMPLANT
NEEDLE 18GX1X1/2 (RX/OR ONLY) (NEEDLE) IMPLANT
PACK ORTHO EXTREMITY (CUSTOM PROCEDURE TRAY) ×1 IMPLANT
PACK TOTAL JOINT (CUSTOM PROCEDURE TRAY) ×1 IMPLANT
PAD ARMBOARD POSITIONER FOAM (MISCELLANEOUS) ×2 IMPLANT
PAD CAST 4YDX4 CTTN HI CHSV (CAST SUPPLIES) IMPLANT
PAD COLD SHLDR WRAP-ON (PAD) ×1 IMPLANT
PADDING CAST COTTON 6X4 STRL (CAST SUPPLIES) ×1 IMPLANT
SET HNDPC FAN SPRY TIP SCT (DISPOSABLE) ×1 IMPLANT
SET PAD KNEE POSITIONER (MISCELLANEOUS) ×1 IMPLANT
SOLN 0.9% NACL 1000 ML (IV SOLUTION) ×1 IMPLANT
SOLN 0.9% NACL POUR BTL 1000ML (IV SOLUTION) ×1 IMPLANT
STAPLER SKIN PROX 35W (STAPLE) ×1 IMPLANT
SUCTION TUBE FRAZIER 10FR DISP (SUCTIONS) ×1 IMPLANT
SUT ETHILON 2 0 FS 18 (SUTURE) IMPLANT
SUT ETHILON 3 0 FSL (SUTURE) ×2 IMPLANT
SUT VIC AB 0 CT1 27XBRD ANBCTR (SUTURE) ×1 IMPLANT
SUT VIC AB 0 CT1 36 (SUTURE) IMPLANT
SUT VIC AB 1 CT1 27XBRD ANBCTR (SUTURE) ×2 IMPLANT
SUT VIC AB 2-0 CT1 TAPERPNT 27 (SUTURE) ×2 IMPLANT
SYR 50ML LL SCALE MARK (SYRINGE) IMPLANT
SYR CONTROL 10ML LL (SYRINGE) ×1 IMPLANT
TOWEL GREEN STERILE (TOWEL DISPOSABLE) ×1 IMPLANT
TOWEL GREEN STERILE FF (TOWEL DISPOSABLE) ×1 IMPLANT
TUBE CONNECTING 12X1/4 (SUCTIONS) ×1 IMPLANT

## 2023-11-17 NOTE — Brief Op Note (Signed)
   Brief Op Note  Date of Surgery: 11/17/2023  Preoperative Diagnosis: RIGHT KNEE POST OP INFECTION  Postoperative Diagnosis: same  Procedure: Procedure(s): IRRIGATION AND DEBRIDEMENT WITH POLYETHYLENE EXCHANGE  Implants: Implant Name Type Inv. Item Serial No. Manufacturer Lot No. LRB No. Used Action  INSERT TIB BEAR KNEE F 8 RT - [id number] Insert INSERT TIB BEAR KNEE F 8 RT  [employer](ORTH,TRAU,BIO,SG) [id number] Right 1 Implanted    Surgeons: Surgeon(s): Genelle Standing, MD  Anesthesia: Spinal    Estimated Blood Loss: See anesthesia record  Complications: None  Condition to PACU: Stable  Standing LITTIE Genelle, MD 11/17/2023 4:03 PM

## 2023-11-17 NOTE — Anesthesia Procedure Notes (Signed)
 Anesthesia Regional Block: Adductor canal block   Pre-Anesthetic Checklist: , timeout performed,  Correct Patient, Correct Site, Correct Laterality,  Correct Procedure, Correct Position, site marked,  Risks and benefits discussed,  Surgical consent,  Pre-op evaluation,  At surgeon's request and post-op pain management  Laterality: Right and Lower  Prep: chloraprep       Needles:  Injection technique: Single-shot      Needle Length: 9cm  Needle Gauge: 22     Additional Needles: Arrow StimuQuik ECHO Echogenic Stimulating PNB Needle  Procedures:,,,, ultrasound used (permanent image in chart),,    Narrative:  Start time: 11/17/2023 2:21 PM End time: 11/17/2023 2:26 PM Injection made incrementally with aspirations every 5 mL.  Performed by: Personally  Anesthesiologist: Leopoldo Bruckner, MD

## 2023-11-17 NOTE — Plan of Care (Signed)
  Problem: Education: Goal: Knowledge of General Education information will improve Description: Including pain rating scale, medication(s)/side effects and non-pharmacologic comfort measures Outcome: Progressing   Problem: Activity: Goal: Risk for activity intolerance will decrease Outcome: Progressing   Problem: Pain Managment: Goal: General experience of comfort will improve and/or be controlled Outcome: Progressing

## 2023-11-17 NOTE — Op Note (Signed)
   Date of Surgery: 11/17/2023  INDICATIONS: [name] is a 73 y.o.-year-old female with right knee prosthetic joint infection.  The risk and benefits of the procedure were discussed in detail and documented in the pre-operative evaluation.   PREOPERATIVE DIAGNOSIS: 1.  Right knee prosthetic joint infection  POSTOPERATIVE DIAGNOSIS: Same.  PROCEDURE: 1.  Right knee irrigation and debridement with synovectomy 2.  Right knee polyethylene exchange  SURGEON: Elspeth LITTIE Parker MD  ASSISTANT: Conley Dawson, ATC  ANESTHESIA:  general  IV FLUIDS AND URINE: See anesthesia record.  ANTIBIOTICS: Ancef   ESTIMATED BLOOD LOSS: 10 mL.  IMPLANTS:  Implant Name Type Inv. Item Serial No. Manufacturer Lot No. LRB No. Used Action  INSERT TIB BEAR KNEE F 8 RT - [id number] Insert INSERT TIB BEAR KNEE F 8 RT  [employer](ORTH,TRAU,BIO,SG) [id number] Right 1 Implanted    DRAINS: None  CULTURES: None  COMPLICATIONS: none  DESCRIPTION OF PROCEDURE:   The patient was identified in the preoperative holding area.  The correct site was marked according to universal protocol with nursing.  Antibiotics were held until [place] were obtained.  She was subsequently taken back to the operating room.  Spinal anesthesia was placed.  She was prepped and draped in the usual sterile fashion.  Final timeout was again performed.  At this point I opened up her previous incision with 15 blade.  There is immediate expression of purulence from the proximal distal aspect of the incision.  Fluid from the knee was taken and sent for culture.  There was a rent in the arthrotomy and as a result this was further opened up.  There was purulence deep to the arthrotomy.  This was thoroughly irrigated.  At this time a synovectomy was performed with electrocautery to eliminate the inner lining and the medial lateral as well as patellofemoral and supra patellar region.  Portions of the synovium were sent for additional cultures.  At this  time I was happy with the debridement.  This time the old polyethylene component was removed using a osteotome.  New corresponding polyethylene was applied.  Vancomycin  powder 1 g was placed into the knee.  The knee arthrotomy was closed in layers of 0 Vicryl.  The remaining portions of the wound were closed with 2-0 Vicryl and 3-0 nylon.  Dressing was placed with Xeroform gauze Ace wrap and ABDs.  Counts are correct at the end of the case she was taken to the PACU without complication    POSTOPERATIVE PLAN: Will be weightbearing as tolerated on the right leg.  We will follow the cultures up and we will plan for an ID consultation as well.  She will likely need a PICC line and IV antibiotics given retained hardware.  Elspeth LITTIE Parker, MD 4:03 PM

## 2023-11-17 NOTE — Anesthesia Postprocedure Evaluation (Signed)
 Anesthesia Post Note  Patient: Tammy Eaton  Procedure(s) Performed: IRRIGATION AND DEBRIDEMENT WITH POLYETHYLENE EXCHANGE (Right: Knee)     Patient location during evaluation: PACU Anesthesia Type: Spinal Level of consciousness: awake and alert Pain management: pain level controlled Vital Signs Assessment: post-procedure vital signs reviewed and stable Respiratory status: spontaneous breathing, nonlabored ventilation, respiratory function stable and patient connected to nasal cannula oxygen Cardiovascular status: blood pressure returned to baseline and stable Postop Assessment: no apparent nausea or vomiting Anesthetic complications: no   No notable events documented.  Last Vitals:  Vitals:   11/17/23 1318 11/17/23 1615  BP: (!) 121/50 (!) 104/54  Pulse: 99 88  Resp: 18 14  Temp: (!) 38.3 C 36.8 C  SpO2: 97% 92%    Last Pain:  Vitals:   11/17/23 1340  TempSrc:   PainSc: 0-No pain                 Thom JONELLE Peoples

## 2023-11-17 NOTE — Anesthesia Procedure Notes (Signed)
 Spinal  Patient location during procedure: OR Start time: 11/17/2023 3:18 PM End time: 11/17/2023 3:22 PM Reason for block: surgical anesthesia Staffing Performed: anesthesiologist  Anesthesiologist: Leopoldo Bruckner, MD Performed by: Leopoldo Bruckner, MD Authorized by: Leopoldo Bruckner, MD   Preanesthetic Checklist Completed: patient identified, IV checked, risks and benefits discussed, surgical consent, monitors and equipment checked, pre-op evaluation and timeout performed Spinal Block Patient position: sitting Prep: DuraPrep Patient monitoring: heart rate, cardiac monitor, continuous pulse ox and blood pressure Approach: midline Location: L4-5 Injection technique: single-shot Needle Needle type: Pencan  Needle gauge: 24 G Needle length: 9 cm Assessment Events: CSF return

## 2023-11-17 NOTE — H&P (Signed)
 Post Operative Evaluation      Procedure/Date of Surgery: Right knee medial unicompartmental knee arthroplasty 9/28   Interval History:      Presents 3 weeks status post above procedure.  Unfortunately she has been having some swelling and drainage the last several days.  She has had some additional drainage since her most recent wound check last Friday.     PMH/PSH/Family History/Social History/Meds/Allergies:         Past Medical History:  Diagnosis Date   Anxiety     Arthritis 2018   Bulging lumbar disc 2018   Complication of anesthesia      low blood pressure after retinal detachment surgery   Constipation     COPD (chronic obstructive pulmonary disease) (HCC)     Coronary artery disease      follows up with dr okey prn   Degenerative disc disease, lumbar 2018   Depression     Emphysema of lung (HCC)     Family history of ovarian cancer      mother died of ovarian cancer   GERD (gastroesophageal reflux disease)     History of hiatal hernia      small   History of kidney stones     History of pneumonia      multiple times   History of stomach ulcers yrs ago   Hyperlipidemia     lung ca 05/2003   Multiple lung nodules      followed by dr kerrin ronco 04-10-2020   Osteoarthritis      spine, knees, hands   PONV (postoperative nausea and vomiting)      pt could not eat during hospital stay   Reflux esophagitis     Scoliosis 2018   Shortness of breath      with lifting heavy things   Wears glasses               Past Surgical History:  Procedure Laterality Date   BREAST EXCISIONAL BIOPSY Left yrs ago   BREAST EXCISIONAL BIOPSY Left     BREAST EXCISIONAL BIOPSY Right 2015   BREAST LUMPECTOMY WITH NEEDLE LOCALIZATION Right 04/11/2013    Procedure: BREAST LUMPECTOMY WITH NEEDLE LOCALIZATION;  Surgeon: Krystal JINNY Russell, MD;  Location: [facility];  Service: General;  Laterality: Right;   BREAST SURGERY   1992    lt br  bx   CHOLECYSTECTOMY   11/11/2011    Procedure: LAPAROSCOPIC CHOLECYSTECTOMY WITH INTRAOPERATIVE CHOLANGIOGRAM;  Surgeon: Krystal JINNY Russell, MD;  Location: [facility];  Service: General;  Laterality: N/A;   CYSTOSCOPY   01/2009    for evaluation of hematuria   CYSTOSCOPY N/A 04/23/2020    Procedure: CYSTOSCOPY;  Surgeon: Marilynne Rosaline SAILOR, MD;  Location: [facility];  Service: Gynecology;  Laterality: N/A;   CYSTOSCOPY W/ URETERAL STENT PLACEMENT Left 11/16/2019    Procedure: CYSTOSCOPY WITH STENT REPLACEMENT;  Surgeon: Renda Glance, MD;  Location: [facility];  Service: Urology;  Laterality: Left;   DILATION AND CURETTAGE OF UTERUS   yrs ago    x 2   ESOPHAGOGASTRODUODENOSCOPY   yrs ago   EYE SURGERY   yrs ago    catataracts-both   KNEE SURGERY   yrs ago    rt knee torn cartilage   LUNG LOBECTOMY Left 05/2003   PARTIAL KNEE ARTHROPLASTY Right 10/27/2023    Procedure: ARTHROPLASTY, KNEE, UNICOMPARTMENTAL;  Surgeon: Genelle Standing, MD;  Location: [facility];  Service: Orthopedics;  Laterality: Right;  RIGHT KNEE MEDIAL UNICOMPARTMENTAL ARTHROPLASTY   RETINAL DETACHMENT SURGERY   2011    left; x2   SINUS SURGERY WITH INSTATRAK   yrs ago   TUBAL LIGATION   yrs ago   URETEROSCOPY WITH HOLMIUM LASER LITHOTRIPSY Left 11/29/2019    Procedure: CYSTOSCOPY; LEFT URETEROSCOPY; HOLMIUM LASER LITHOTRIPSY; LEFT URETERAL STENT EXCHANGE;  Surgeon: Nieves Cough, MD;  Location: [facility];  Service: Urology;  Laterality: Left;  ONLY NEEDS 60 MIN   VAGINAL HYSTERECTOMY N/A 04/23/2020    Procedure: HYSTERECTOMY VAGINAL with bilateral salpingooopherectomy;;  Surgeon: Cleotilde Ronal RAMAN, MD;  Location: [facility];  Service: Gynecology;  Laterality: N/A;  Dr Cleotilde to perform; total time requested for all procedures is 3 hours   VAGINAL PROLAPSE REPAIR N/A 04/23/2020    Procedure: VAGINAL VAULT SUSPENSION;  Surgeon: Marilynne Rosaline SAILOR, MD;  Location: [facility];  Service:  Gynecology;  Laterality: N/A;   VIDEO BRONCHOSCOPY WITH ENDOBRONCHIAL NAVIGATION N/A 01/15/2015    Procedure: VIDEO BRONCHOSCOPY WITH ENDOBRONCHIAL NAVIGATION;  Surgeon: Elspeth JAYSON Millers, MD;  Location: [facility];  Service: Thoracic;  Laterality: N/A;   VOCAL CORD LATERALIZATION, ENDOSCOPIC APPROACH W/ MLB   yrs ago    nodule removed        Social History         Socioeconomic History   Marital status: Married      Spouse name: Not on file   Number of children: 2   Years of education: Not on file   Highest education level: Not on file  Occupational History   Occupation: Retired  Tobacco Use   Smoking status: Former      Current packs/day: 0.00      Average packs/day: 2.0 packs/day for 45.6 years (91.3 ttl pk-yrs)      Types: Cigarettes      Start date: 27      Quit date: 09/21/2008      Years since quitting: 15.1   Smokeless tobacco: Never  Vaping Use   Vaping status: Never Used  Substance and Sexual Activity   Alcohol use: Yes      Alcohol/week: 0.0 - 1.0 standard drinks of alcohol      Comment: occ   Drug use: No   Sexual activity: Not Currently      Partners: Male      Birth control/protection: Post-menopausal  Other Topics Concern   Not on file  Social History Narrative   Not on file    Social Drivers of Health    Financial Resource Strain: Not on file  Food Insecurity: Not on file  Transportation Needs: Not on file  Physical Activity: Not on file  Stress: Not on file  Social Connections: Not on file         Family History  Problem Relation Age of Onset   Hypertension Mother     Diabetes Mother     Ovarian cancer Mother 71   Alcohol abuse Father          Allergies       Allergies  Allergen Reactions   Adhesive [Tape] Other (See Comments)      Burned skin   TAPE WITH BETADINE  was used   Septra [Sulfamethoxazole-Trimethoprim] Hives   Amoxicillin Other (See Comments)      Had a case of thrush            Current Outpatient Medications   Medication Sig Dispense Refill   aspirin  EC 325 MG  tablet Take 1 tablet (325 mg total) by mouth daily. 14 tablet 0   oxyCODONE  (ROXICODONE ) 5 MG immediate release tablet Take 1 tablet (5 mg total) by mouth every 4 (four) hours as needed for severe pain (pain score 7-10) or breakthrough pain. 40 tablet 0   acetaminophen  (TYLENOL ) 500 MG tablet Take 500 mg by mouth every 8 (eight) hours.       amitriptyline  (ELAVIL ) 100 MG tablet Take 100 mg by mouth at bedtime.       aspirin  EC 325 MG tablet Take 325 mg by mouth daily.       Cholecalciferol (VITAMIN D3 PO) Take 2,000 Units by mouth daily.       clonazePAM (KLONOPIN) 1 MG tablet Take 1 mg by mouth at bedtime.       doxycycline (VIBRA-TABS) 100 MG tablet Take 1 tablet (100 mg total) by mouth 2 (two) times daily for 7 days. 14 tablet 0   fluticasone  (FLONASE) 50 MCG/ACT nasal spray Place 2 sprays into both nostrils daily.       fluticasone -salmeterol (WIXELA INHUB) 250-50 MCG/ACT AEPB Inhale 1 puff into the lungs in the morning and at bedtime. 60 each 11   ibuprofen  (ADVIL ) 800 MG tablet Take 800 mg by mouth every 8 (eight) hours as needed (pain.).       Multiple Vitamin (MULTIVITAMIN WITH MINERALS) TABS tablet Take 1 tablet by mouth daily.       oxyCODONE  (ROXICODONE ) 5 MG immediate release tablet Take 1 tablet (5 mg total) by mouth every 4 (four) hours as needed for severe pain (pain score 7-10) or breakthrough pain. 15 tablet 0   Psyllium (METAMUCIL 3 IN 1 DAILY FIBER PO) Take 2 capsules by mouth daily.       rosuvastatin (CRESTOR) 5 MG tablet Take 5 mg by mouth 4 (four) times a week. Mondays through Thursdays.       tiZANidine (ZANAFLEX) 4 MG tablet Take 4 mg by mouth 3 (three) times daily as needed for muscle spasms.       traMADol  (ULTRAM ) 50 MG tablet Take 50 mg by mouth every 12 (twelve) hours as needed (pain.).       traMADol  (ULTRAM ) 50 MG tablet Take 1 tablet (50 mg total) by mouth every 6 (six) hours as needed. 30 tablet 0      No  current facility-administered medications for this visit.      Imaging Results (Last 48 hours)  No results found.     Review of Systems:   A ROS was performed including pertinent positives and negatives as documented in the HPI.     Musculoskeletal Exam:     Right knee incision does appear to have subcutaneous swelling in the prepatellar region with a fluid collection.  She can move the knee from 0-90 without significant pain.   Imaging:     3 views right knee: Status post right knee unicompartmental knee arthroplasty without evidence of complication   I personally reviewed and interpreted the radiographs.     Assessment:   3 weeks status post right medial unicompartmental knee arthroplasty with evidence of infection.  At this time I have concern for possible prosthetic joint infection as well as will motion is decreased.  At this time I have recommended an acute surgical debridement with possible polyethylene exchange in order to promote clearance of this infection.  We would also take cultures at that time plan for referral to infectious disease.   Plan :     -  Plan for return to the OR for right knee irrigation and debridement with polyethylene exchange     After a lengthy discussion of treatment options, including risks, benefits, alternatives, complications of surgical and nonsurgical conservative options, the patient elected surgical repair.    The patient  is aware of the material risks  and complications including, but not limited to injury to adjacent structures, neurovascular injury, infection, numbness, bleeding, implant failure, thermal burns, stiffness, persistent pain, failure to heal, disease transmission from allograft, need for further surgery, dislocation, anesthetic risks, blood clots, risks of death,and others. The probabilities of surgical success and failure discussed with patient given their particular co-morbidities.The time and nature of expected  rehabilitation and recovery was discussed.The patient's questions were all answered preoperatively.  No barriers to understanding were noted. I explained the natural history of the disease process and Rx rationale.  I explained to the patient what I considered to be reasonable expectations given their personal situation.  The final treatment plan was arrived at through a shared patient decision making process model.               I personally saw and evaluated the patient, and participated in the management and treatment plan.   Elspeth Parker, MD Attending Physician, Orthopedic Surgery   This document was dictated using Dragon voice recognition software. A reasonable attempt at proof reading has been made to minimize errors.

## 2023-11-17 NOTE — Transfer of Care (Signed)
 Immediate Anesthesia Transfer of Care Note  Patient: Tammy Eaton  Procedure(s) Performed: IRRIGATION AND DEBRIDEMENT WITH POLYETHYLENE EXCHANGE (Right: Knee)  Patient Location: PACU  Anesthesia Type:MAC and Spinal  Level of Consciousness: awake, alert , and oriented  Airway & Oxygen Therapy: Patient Spontanous Breathing  Post-op Assessment: Report given to RN and Post -op Vital signs reviewed and stable  Post vital signs: stable  Last Vitals:  Vitals Value Taken Time  BP 104/54 11/17/23 16:15  Temp 36.8 C 11/17/23 16:15  Pulse 86 11/17/23 16:20  Resp 18 11/17/23 16:20  SpO2 94 % 11/17/23 16:20  Vitals shown include unfiled device data.  Last Pain:  Vitals:   11/17/23 1340  TempSrc:   PainSc: 0-No pain         Complications: No notable events documented.

## 2023-11-18 ENCOUNTER — Encounter (HOSPITAL_COMMUNITY): Payer: Self-pay | Admitting: Orthopaedic Surgery

## 2023-11-18 ENCOUNTER — Encounter (HOSPITAL_BASED_OUTPATIENT_CLINIC_OR_DEPARTMENT_OTHER): Admitting: Physical Therapy

## 2023-11-18 DIAGNOSIS — T8453XA Infection and inflammatory reaction due to internal right knee prosthesis, initial encounter: Secondary | ICD-10-CM

## 2023-11-18 LAB — BASIC METABOLIC PANEL WITH GFR
Anion gap: 9 (ref 5–15)
BUN: 9 mg/dL (ref 8–23)
CO2: 25 mmol/L (ref 22–32)
Calcium: 8.1 mg/dL — ABNORMAL LOW (ref 8.9–10.3)
Chloride: 106 mmol/L (ref 98–111)
Creatinine, Ser: 0.84 mg/dL (ref 0.44–1.00)
GFR, Estimated: >60 mL/min (ref >60)
Glucose, Bld: 86 mg/dL (ref 70–99)
Potassium: 4.4 mmol/L (ref 3.5–5.1)
Sodium: 140 mmol/L (ref 135–145)

## 2023-11-18 LAB — SEDIMENTATION RATE: Sed Rate: 83 mm/h — ABNORMAL HIGH (ref 0–22)

## 2023-11-18 LAB — CK: Total CK: 52 U/L (ref 38–234)

## 2023-11-18 LAB — C-REACTIVE PROTEIN: CRP: 15.8 mg/dL — ABNORMAL HIGH (ref <1.0)

## 2023-11-18 MED ORDER — VANCOMYCIN HCL 1250 MG/250ML IV SOLN
1250.0000 mg | INTRAVENOUS | Status: DC
Start: 2023-11-18 — End: 2023-11-18
  Filled 2023-11-18: qty 250

## 2023-11-18 MED ORDER — SODIUM CHLORIDE 0.9 % IV SOLN
2.0000 g | INTRAVENOUS | Status: DC
  Administered 2023-11-18: 2 g via INTRAVENOUS
  Filled 2023-11-18: qty 20

## 2023-11-18 MED ORDER — SODIUM CHLORIDE 0.9 % IV SOLN
3.0000 g | Freq: Four times a day (QID) | INTRAVENOUS | Status: DC
  Administered 2023-11-18: 3 g via INTRAVENOUS
  Filled 2023-11-18: qty 8

## 2023-11-18 MED ORDER — VANCOMYCIN HCL IN DEXTROSE 1-5 GM/200ML-% IV SOLN: 1000.0000 mg | Freq: Two times a day (BID) | INTRAVENOUS | Status: DC

## 2023-11-18 MED ORDER — PIPERACILLIN-TAZOBACTAM 4.5 G IVPB: 4.5000 g | Freq: Three times a day (TID) | INTRAVENOUS | Status: DC

## 2023-11-18 MED ORDER — DAPTOMYCIN-SODIUM CHLORIDE 500-0.9 MG/50ML-% IV SOLN
8.0000 mg/kg | Freq: Every day | INTRAVENOUS | Status: DC
  Administered 2023-11-18: 500 mg via INTRAVENOUS
  Filled 2023-11-18 (×2): qty 50

## 2023-11-18 MED ORDER — KETOROLAC TROMETHAMINE 15 MG/ML IJ SOLN
15.0000 mg | Freq: Three times a day (TID) | INTRAMUSCULAR | Status: DC
  Administered 2023-11-18 (×2): 15 mg via INTRAVENOUS
  Filled 2023-11-18 (×3): qty 1

## 2023-11-18 NOTE — Evaluation (Signed)
 Physical Therapy Evaluation Patient Details Name: Tammy Eaton MRN: [id number] DOB: 05/03/1950 Today's Date: 11/18/2023  History of Present Illness  Pt is a 73 y.o. female admitted 11/17/23 for right knee prosthetic joint infection. Pt s/p I&D with synovectomy and right knee polyethylene exchange 10/14. PMHx: OA, s/p unicompartmental knee arthroplasty 11/01/23, COPD, CAD, anxiety, depression, GERD, and HLD.   Clinical Impression  Pt admitted with above diagnosis. PTA, pt was independent with functional mobility, ADLs/IADLs, and driving. She lives with her husband in a one story house with 3 STE. Pt currently with functional limitations due to the deficits listed below (see PT Problem List). She required supervision for bed mobility, supervision for transfers using RW, and CGA for gait using RW. Pt ambulated with multiple gait abnormalities, but was receptive to cueing for improved sequencing. She was unsteady veering to the left during navigation and is considered a moderate fall risk. Pt will benefit from acute skilled PT to increase her independence and safety with mobility to allow discharge. Recommend continued OPPT to increase ROM/strength, decrease pain, improve balance, decrease fall risk, advance activity tolerance, and optimize safety and independence with functional mobility.     If plan is discharge home, recommend the following: A little help with walking and/or transfers;A little help with bathing/dressing/bathroom;Assistance with cooking/housework;Assist for transportation;Help with stairs or ramp for entrance   Can travel by private vehicle        Equipment Recommendations Rolling walker (2 wheels)  Recommendations for Other Services       Functional Status Assessment Patient has had a recent decline in their functional status and demonstrates the ability to make significant improvements in function in a reasonable and predictable amount of time.     Precautions /  Restrictions Precautions Precautions: Fall Recall of Precautions/Restrictions: Intact Restrictions Weight Bearing Restrictions Per Provider Order: Yes RLE Weight Bearing Per Provider Order: Weight bearing as tolerated      Mobility  Bed Mobility Overal bed mobility: Needs Assistance Bed Mobility: Supine to Sit     Supine to sit: Supervision, HOB elevated     General bed mobility comments: Pt sat up on L side of bed with increased time. She managed RLE with BUE support.    Transfers Overall transfer level: Needs assistance Equipment used: Rolling walker (2 wheels) Transfers: Sit to/from Stand, Bed to chair/wheelchair/BSC Sit to Stand: Supervision   Step pivot transfers: Supervision       General transfer comment: Pt stood from lowest bed height. Demonstrated proper hand placement using RW. She powered up without physical assist, increased time to reach upright posture. Transferred to recliner chair. Good eccentric control.    Ambulation/Gait Ambulation/Gait assistance: Contact guard assist Gait Distance (Feet): 100 Feet Assistive device: Rolling walker (2 wheels) Gait Pattern/deviations: Step-to pattern, Decreased step length - right, Decreased step length - left, Decreased stride length, Antalgic, Narrow base of support, Trunk flexed Gait velocity: decreased Gait velocity interpretation: <1.31 ft/sec, indicative of household ambulator   General Gait Details: Pt ambulated with short slow steps. She demonstrated a NBOS and flat foot contact bilat. Cued increase BOS to shoulder width apart, increase step length to pass opposite foot. Pt had a slight fwd lean, cued proximity to RW. She veers to the L during ambulation. Cued pt to scan the environment and aided with navigation. No LOB.  Stairs            Wheelchair Mobility     Tilt Bed    Modified Rankin (Stroke Patients Only)  Balance Overall balance assessment: Needs assistance Sitting-balance  support: No upper extremity supported, Feet supported Sitting balance-Leahy Scale: Fair     Standing balance support: Bilateral upper extremity supported, During functional activity, Reliant on assistive device for balance Standing balance-Leahy Scale: Poor Standing balance comment: Pt dependent on RW                             Pertinent Vitals/Pain Pain Assessment Pain Assessment: Faces Faces Pain Scale: Hurts little more Pain Location: R Knee Pain Descriptors / Indicators: Discomfort, Aching Pain Intervention(s): Monitored during session, Limited activity within patient's tolerance, Repositioned, Ice applied    Home Living Family/patient expects to be discharged to:: Private residence Living Arrangements: Spouse/significant other Available Help at Discharge: Family;Available 24 hours/day Type of Home: House Home Access: Stairs to enter Entrance Stairs-Rails: None Entrance Stairs-Number of Steps: 3   Home Layout: One level Home Equipment: None Additional Comments: Pt reports she is going to OPPT 1x/wk.    Prior Function Prior Level of Function : Independent/Modified Independent;Driving             Mobility Comments: Ambulates without AD. Denies fall hx. ADLs Comments: Indep with ADLs/IADLs. Drives.     Extremity/Trunk Assessment   Upper Extremity Assessment Upper Extremity Assessment: Overall WFL for tasks assessed;Right hand dominant    Lower Extremity Assessment Lower Extremity Assessment: RLE deficits/detail RLE Deficits / Details: Decreased knee AROM d/t pain. Pt resistant to PROM attemps. Able to achieve achieve ~70deg. She is in a ace wrap dressing. At least 3/5 strength, did not resist d/t pain at rest. RLE: Unable to fully assess due to pain RLE Sensation: WNL RLE Coordination: decreased gross motor    Cervical / Trunk Assessment Cervical / Trunk Assessment: Normal  Communication   Communication Communication: No apparent  difficulties    Cognition Arousal: Alert Behavior During Therapy: WFL for tasks assessed/performed   PT - Cognitive impairments: No apparent impairments                       PT - Cognition Comments: Pt A,Ox4 Following commands: Intact       Cueing Cueing Techniques: Verbal cues, Gestural cues, Tactile cues     General Comments General comments (skin integrity, edema, etc.): VSS on RA    Exercises     Assessment/Plan    PT Assessment Patient needs continued PT services  PT Problem List Decreased strength;Decreased range of motion;Decreased activity tolerance;Decreased balance;Decreased mobility;Decreased knowledge of use of DME;Decreased safety awareness;Pain       PT Treatment Interventions DME instruction;Gait training;Stair training;Functional mobility training;Therapeutic activities;Therapeutic exercise;Balance training;Patient/family education    PT Goals (Current goals can be found in the Care Plan section)  Acute Rehab PT Goals Patient Stated Goal: Regain independence and have less pain PT Goal Formulation: With patient Time For Goal Achievement: 12/02/23 Potential to Achieve Goals: Good    Frequency Min 2X/week     Co-evaluation               AM-PAC PT 6 Clicks Mobility  Outcome Measure Help needed turning from your back to your side while in a flat bed without using bedrails?: A Little Help needed moving from lying on your back to sitting on the side of a flat bed without using bedrails?: A Little Help needed moving to and from a bed to a chair (including a wheelchair)?: A Little Help needed standing up from a  chair using your arms (e.g., wheelchair or bedside chair)?: A Little Help needed to walk in hospital room?: A Little Help needed climbing 3-5 steps with a railing? : A Lot 6 Click Score: 17    End of Session Equipment Utilized During Treatment: Gait belt Activity Tolerance: Patient tolerated treatment well Patient left: in  chair;with call bell/phone within reach;with chair alarm set Nurse Communication: Mobility status PT Visit Diagnosis: Other abnormalities of gait and mobility (R26.89);Difficulty in walking, not elsewhere classified (R26.2);Unsteadiness on feet (R26.81);Pain Pain - Right/Left: Right Pain - part of body: Knee    Time: 8671-8647 PT Time Calculation (min) (ACUTE ONLY): 24 min   Charges:   PT Evaluation $PT Eval Low Complexity: 1 Low   PT General Charges $$ ACUTE PT VISIT: 1 Visit         Randall SAUNDERS, PT, DPT Acute Rehabilitation Services Office: [phone] Secure Chat Preferred  Delon CHRISTELLA Callander 11/18/2023, 2:10 PM

## 2023-11-18 NOTE — Progress Notes (Signed)
   Subjective:  Patient reports pain as moderate.  No fevers or chills overnight.  Vital signs stable although with some hypotension this morning but asymptomatic.  Objective:   VITALS:   Vitals:   11/17/23 2215 11/17/23 2354 11/18/23 0235 11/18/23 0520  BP: (!) 119/59 (!) 79/45 (!) 88/54 (!) 105/52  Pulse: 91 73 71 69  Resp: 17 17 18 17   Temp: 98.4 F (36.9 C) 98.5 F (36.9 C)  98.8 F (37.1 C)  TempSrc: Oral Oral  Oral  SpO2: 92% 94% 95% 94%  Weight:      Height:        Right knee dressing is clean dry intact.  Range of motion limited due to pain of the right knee although she is able to bend at 90 and flex straight.  Distal neurosensory exam is intact without saturation on the dressing Lab Results  Component Value Date   WBC 7.2 10/23/2023   HGB 14.8 10/23/2023   HCT 46.7 (H) 10/23/2023   MCV 92.3 10/23/2023   PLT 244 10/23/2023     Assessment/Plan:  1 Day Post-Op with a right knee prosthetic joint infection following acute washout and polyethylene exchange.   - Patient to work with PT to optimize mobilization safely - DVT ppx - SCDs, ambulation, aspirin  325 daily - Defer to infectious disease consult for antibiotics plan.  Patient currently on broad-spectrum vancomycin  and Zosyn per pharmacy dosing.  PICC line order placed.  Cultures are no growth to date - WBAT operative extremity - Pain control - multimodal pain management, ATC acetaminophen  in conjunction with as needed narcotic (oxycodone ), although this should be minimized with other modalities  - Will likely need additional 2-3 night stay pending speciation and cultures  Tammy Eaton 11/18/2023, 7:25 AM

## 2023-11-18 NOTE — Progress Notes (Incomplete)
 73 year old female with prior history as below including right knee medial unicompartmental knee arthroplasty on 9/23   10/15 right knee I&D with synovectomy, polyethylene exchange,  Past Medical History:  Diagnosis Date   Anxiety    Arthritis 2018   Bulging lumbar disc 2018   Complication of anesthesia    low blood pressure after retinal detachment surgery   Constipation    COPD (chronic obstructive pulmonary disease) (HCC)    Coronary artery disease    follows up with dr okey prn   Degenerative disc disease, lumbar 2018   Depression    Emphysema of lung (HCC)    Family history of ovarian cancer    mother died of ovarian cancer   GERD (gastroesophageal reflux disease)    History of hiatal hernia    small   History of kidney stones    History of pneumonia    multiple times   History of stomach ulcers yrs ago   Hyperlipidemia    lung ca 05/2003   Multiple lung nodules    followed by dr kerrin ronco 04-10-2020   Osteoarthritis    spine, knees, hands   PONV (postoperative nausea and vomiting)    pt could not eat during hospital stay   Reflux esophagitis    Scoliosis 2018   Shortness of breath    with lifting heavy things   Wears glasses    Past Surgical History:  Procedure Laterality Date   BREAST EXCISIONAL BIOPSY Left yrs ago   BREAST EXCISIONAL BIOPSY Left    BREAST EXCISIONAL BIOPSY Right 2015   BREAST LUMPECTOMY WITH NEEDLE LOCALIZATION Right 04/11/2013   Procedure: BREAST LUMPECTOMY WITH NEEDLE LOCALIZATION;  Surgeon: Krystal JINNY Russell, MD;  Location: [facility];  Service: General;  Laterality: Right;   BREAST SURGERY  1992   lt br bx   CHOLECYSTECTOMY  11/11/2011   Procedure: LAPAROSCOPIC CHOLECYSTECTOMY WITH INTRAOPERATIVE CHOLANGIOGRAM;  Surgeon: Krystal JINNY Russell, MD;  Location: [facility] OR;  Service: General;  Laterality: N/A;   CYSTOSCOPY  01/2009   for evaluation of hematuria   CYSTOSCOPY N/A 04/23/2020   Procedure: CYSTOSCOPY;  Surgeon:  Marilynne Rosaline SAILOR, MD;  Location: [facility];  Service: Gynecology;  Laterality: N/A;   CYSTOSCOPY W/ URETERAL STENT PLACEMENT Left 11/16/2019   Procedure: CYSTOSCOPY WITH STENT REPLACEMENT;  Surgeon: Renda Glance, MD;  Location: [facility];  Service: Urology;  Laterality: Left;   DILATION AND CURETTAGE OF UTERUS  yrs ago   x 2   ESOPHAGOGASTRODUODENOSCOPY  yrs ago   EYE SURGERY  yrs ago   catataracts-both   KNEE SURGERY  yrs ago   rt knee torn cartilage   LUNG LOBECTOMY Left 05/2003   PARTIAL KNEE ARTHROPLASTY Right 10/27/2023   Procedure: ARTHROPLASTY, KNEE, UNICOMPARTMENTAL;  Surgeon: Genelle Standing, MD;  Location: [facility];  Service: Orthopedics;  Laterality: Right;  RIGHT KNEE MEDIAL UNICOMPARTMENTAL ARTHROPLASTY   RETINAL DETACHMENT SURGERY  2011   left; x2   SINUS SURGERY WITH INSTATRAK  yrs ago   TUBAL LIGATION  yrs ago   URETEROSCOPY WITH HOLMIUM LASER LITHOTRIPSY Left 11/29/2019   Procedure: CYSTOSCOPY; LEFT URETEROSCOPY; HOLMIUM LASER LITHOTRIPSY; LEFT URETERAL STENT EXCHANGE;  Surgeon: Nieves Cough, MD;  Location: [facility];  Service: Urology;  Laterality: Left;  ONLY NEEDS 60 MIN   VAGINAL HYSTERECTOMY N/A 04/23/2020   Procedure: HYSTERECTOMY VAGINAL with bilateral salpingooopherectomy;;  Surgeon: Cleotilde Ronal RAMAN, MD;  Location: [facility];  Service: Gynecology;  Laterality: N/A;  Dr Cleotilde to perform; total time requested for all procedures is 3 hours   VAGINAL PROLAPSE REPAIR N/A 04/23/2020   Procedure: VAGINAL VAULT SUSPENSION;  Surgeon: Marilynne Rosaline SAILOR, MD;  Location: [facility];  Service: Gynecology;  Laterality: N/A;   VIDEO BRONCHOSCOPY WITH ENDOBRONCHIAL NAVIGATION N/A 01/15/2015   Procedure: VIDEO BRONCHOSCOPY WITH ENDOBRONCHIAL NAVIGATION;  Surgeon: Elspeth JAYSON Millers, MD;  [facility];  Service: Thoracic;  Laterality: N/A;   VOCAL CORD LATERALIZATION, ENDOSCOPIC APPROACH W/ MLB  yrs ago   nodule removed

## 2023-11-18 NOTE — Consult Note (Addendum)
 I have seen and examined the patient. I have personally reviewed the clinical findings, laboratory findings, microbiological data and imaging studies. The assessment and treatment plan was discussed with the Nurse Practitioner. I agree with her/his recommendations except following additions/corrections  73 year old female with prior history as below including right knee medial unicompartmental knee arthroplasty on 9/23, soon followed by welling of rt knee/thigh and leg and later starting to have drainage at the surgical site when working with PT. Was prescribed 3 days of cephalexin  on 10/3 then doxycycline for 10 days on 10/14  reports stopped taking after Ortho clinic visit on 10/10). She also reports fluid taken from rt knee at office visit.   10/14 s/p rt knee I and D with synovectomy and polyethylene exchange. Purulence noted  Exam- adult female, sitting in the bed, not in acute distress, HEENT wnl, heart, lung and abdomen wnl, rt knee with post op bandaging C/D/I. No pedal edema or signs of septic joint. No rashes. Awake, alert and oriented. Non focal exam  T max 100.9 during admit but already on antibiotics to get blood cultures  Plan  - will change to IV Vancomycin  to daptomycin; change IV Uansyn to IV ceftriaxone   - fu OR cultures as well as ? Ortho office cultures  - PICC has been ordered - monitor CBC, CMP and CPK. ESR and CRP ordered  - Universal/standard isolation precautions  Following   I spent 80 minutes involved in face-to-face and non-face-to-face activities for this patient on the day of the visit. Professional time spent includes the following activities: preparing to see the patient (review of tests), reviewing notes in EMR, performing a medically appropriate examination, ordering medications, communicating with other health care professionals, documenting clinical information in the EMR, communicating results and counseling patient regarding medication and plan of care,  and care coordination.   [facility]    Date of Admission:  11/17/2023     Reason for Consult: Right knee postop infection, s/p washout. Referring Provider: Dr. Genelle   Abx: Daptomycin and ceftriaxone        Assessment: Tammy Eaton 73 year old female, here with early onset prosthetic joint infection, s/p acute washout and polyethylene exchange.  Preliminary cultures with no growth.  On broad coverage with vancomycin  and Unasyn, will de-escalate to daptomycin and ceftriaxone , as since there is no need for Pseudomonas coverage  Plan: Discontinue vancomycin  and Unasyn. 2.  Start daptomycin and ceftriaxone . 3.  Monitor CBC, CK, and BMP. 4.  Follow-up cultures from I&D.    ------------------------------------------------ Principal Problem:   Prosthetic joint infection Active Problems:   Bacterial infection of knee joint (HCC)   HPI: Tammy Eaton is a 73 y.o. female with a history of osteoarthritis, about 3 weeks ago, she underwent right knee medial unicompartmental knee arthroplasty.  Says a day after the procedure, had some mild swelling, was prescribed Keflex, however continued to have worsening swelling, doxycycline was started.  She had noted some pustular fluid was working with physical therapy. Had a mild fever yesterday.  No diarrhea or rashes.   She underwent right knee irrigation and debridement with synovectomy with orthopedics on 10/14.  Started broadly on vancomycin  and Zosyn.    Family History  Problem Relation Age of Onset   Hypertension Mother    Diabetes Mother    Ovarian cancer Mother 77   Alcohol abuse Father     Social History   Tobacco Use   Smoking status: Former  Current packs/day: 0.00    Average packs/day: 2.0 packs/day for 45.6 years (91.3 ttl pk-yrs)    Types: Cigarettes    Start date: 35    Quit date: 09/21/2008    Years since quitting: 15.1   Smokeless tobacco: Never  Vaping Use   Vaping status:  Never Used  Substance Use Topics   Alcohol use: Yes    Alcohol/week: 0.0 - 1.0 standard drinks of alcohol    Comment: occ   Drug use: No    Allergies  Allergen Reactions   Adhesive [Tape] Other (See Comments)    Burned skin  TAPE WITH BETADINE  was used   Septra [Sulfamethoxazole-Trimethoprim] Hives   Amoxicillin Other (See Comments)    Had a case of thrush    Review of Systems: ROS All Other ROS was negative, except mentioned above   Past Medical History:  Diagnosis Date   Anxiety    Arthritis 2018   Bulging lumbar disc 2018   Complication of anesthesia    low blood pressure after retinal detachment surgery   Constipation    COPD (chronic obstructive pulmonary disease) (HCC)    Coronary artery disease    follows up with dr okey prn   Degenerative disc disease, lumbar 2018   Depression    Emphysema of lung (HCC)    Family history of ovarian cancer    mother died of ovarian cancer   GERD (gastroesophageal reflux disease)    History of hiatal hernia    small   History of kidney stones    History of pneumonia    multiple times   History of stomach ulcers yrs ago   Hyperlipidemia    lung ca 05/2003   Multiple lung nodules    followed by dr kerrin ronco 04-10-2020   Osteoarthritis    spine, knees, hands   PONV (postoperative nausea and vomiting)    pt could not eat during hospital stay   Reflux esophagitis    Scoliosis 2018   Shortness of breath    with lifting heavy things   Wears glasses    Past Surgical History:  Procedure Laterality Date   BREAST EXCISIONAL BIOPSY Left yrs ago   BREAST EXCISIONAL BIOPSY Left    BREAST EXCISIONAL BIOPSY Right 2015   BREAST LUMPECTOMY WITH NEEDLE LOCALIZATION Right 04/11/2013   Procedure: BREAST LUMPECTOMY WITH NEEDLE LOCALIZATION;  Surgeon: Krystal JINNY Russell, MD;  Location: [facility];  Service: General;  Laterality: Right;   BREAST SURGERY  1992   lt br bx   CHOLECYSTECTOMY  11/11/2011    Procedure: LAPAROSCOPIC CHOLECYSTECTOMY WITH INTRAOPERATIVE CHOLANGIOGRAM;  Surgeon: Krystal JINNY Russell, MD;  Location: [facility] OR;  Service: General;  Laterality: N/A;   CONVERSION TO TOTAL KNEE Right 11/17/2023   Procedure: IRRIGATION AND DEBRIDEMENT WITH POLYETHYLENE EXCHANGE;  Surgeon: Genelle Standing, MD;  Location[facility];  Service: Orthopedics;  Laterality: Right;   CYSTOSCOPY  01/2009   for evaluation of hematuria   CYSTOSCOPY N/A 04/23/2020   Procedure: CYSTOSCOPY;  Surgeon: Marilynne Rosaline SAILOR, MD;  Location: [facility];  Service: Gynecology;  Laterality: N/A;   CYSTOSCOPY W/ URETERAL STENT PLACEMENT Left 11/16/2019   Procedure: CYSTOSCOPY WITH STENT REPLACEMENT;  Surgeon: Renda Glance, MD;  Location: [facility];  Service: Urology;  Laterality: Left;   DILATION AND CURETTAGE OF UTERUS  yrs ago   x 2   ESOPHAGOGASTRODUODENOSCOPY  yrs ago   EYE SURGERY  yrs ago   catataracts-both   KNEE SURGERY  yrs ago   rt knee torn cartilage   LUNG LOBECTOMY Left 05/2003   PARTIAL KNEE ARTHROPLASTY Right 10/27/2023   Procedure: ARTHROPLASTY, KNEE, UNICOMPARTMENTAL;  Surgeon: Genelle Standing, MD;  Location: [facility];  Service: Orthopedics;  Laterality: Right;  RIGHT KNEE MEDIAL UNICOMPARTMENTAL ARTHROPLASTY   RETINAL DETACHMENT SURGERY  2011   left; x2   SINUS SURGERY WITH INSTATRAK  yrs ago   TUBAL LIGATION  yrs ago   URETEROSCOPY WITH HOLMIUM LASER LITHOTRIPSY Left 11/29/2019   Procedure: CYSTOSCOPY; LEFT URETEROSCOPY; HOLMIUM LASER LITHOTRIPSY; LEFT URETERAL STENT EXCHANGE;  Surgeon: Nieves Cough, MD;  Location: [facility];  Service: Urology;  Laterality: Left;  ONLY NEEDS 60 MIN   VAGINAL HYSTERECTOMY N/A 04/23/2020   Procedure: HYSTERECTOMY VAGINAL with bilateral salpingooopherectomy;;  Surgeon: Cleotilde Ronal RAMAN, MD;  Location: [facility];  Service: Gynecology;  Laterality: N/A;  Dr Cleotilde to perform; total time requested for all procedures is 3 hours   VAGINAL PROLAPSE  REPAIR N/A 04/23/2020   Procedure: VAGINAL VAULT SUSPENSION;  Surgeon: Marilynne Rosaline SAILOR, MD;  [facility];  Service: Gynecology;  Laterality: N/A;   VIDEO BRONCHOSCOPY WITH ENDOBRONCHIAL NAVIGATION N/A 01/15/2015   Procedure: VIDEO BRONCHOSCOPY WITH ENDOBRONCHIAL NAVIGATION;  Surgeon: Standing JAYSON Millers, MD;  Location: [facility];  Service: Thoracic;  Laterality: N/A;   VOCAL CORD LATERALIZATION, ENDOSCOPIC APPROACH W/ MLB  yrs ago   nodule removed     Scheduled Meds:  acetaminophen   1,000 mg Oral Q6H   amitriptyline   100 mg Oral QHS   aspirin   325 mg Oral Daily   cholecalciferol  2,000 Units Oral Daily   clonazePAM  1 mg Oral QHS   docusate sodium  100 mg Oral BID   doxycycline  100 mg Oral Q12H   fluticasone   2 spray Each Nare Daily   fluticasone  furoate-vilanterol  1 puff Inhalation Daily   multivitamin with minerals  1 tablet Oral Daily   rosuvastatin  5 mg Oral Once per day on Monday Tuesday Wednesday Thursday   Continuous Infusions:  sodium chloride  125 mL/hr at 11/18/23 0733   ampicillin-sulbactam (UNASYN) IV     vancomycin      PRN Meds:.acetaminophen , HYDROmorphone  (DILAUDID ) injection, ondansetron  **OR** ondansetron  (ZOFRAN ) IV, oxyCODONE , oxyCODONE , tiZANidine   OBJECTIVE: Blood pressure 121/73, pulse 78, temperature 98.5 F (36.9 C), temperature source Oral, resp. rate 17, height 5' 5.5 (1.664 m), weight 62.6 kg, last menstrual period 09/26/2013, SpO2 95%.  Physical Exam Sitting in bed, not in acute distress. Heart with RRR. Right knee dressing clean dry and intact.  Limited range of motion due to pain.  Normal sensation  Lab Results Lab Results  Component Value Date   WBC 7.2 10/23/2023   HGB 14.8 10/23/2023   HCT 46.7 (H) 10/23/2023   MCV 92.3 10/23/2023   PLT 244 10/23/2023    Lab Results  Component Value Date   CREATININE 0.68 04/19/2020   BUN 17 04/19/2020   NA 141 04/19/2020   K 3.8 04/19/2020   CL 109 04/19/2020    CO2 24 04/19/2020    Lab Results  Component Value Date   ALT 22 10/28/2017   AST 19 10/28/2017   ALKPHOS 79 10/28/2017   BILITOT 0.9 10/28/2017     Microbiology: Recent Results (from the past 240 hours)  Aerobic/Anaerobic Culture w Gram Stain (surgical/deep wound)     Status: None (Preliminary result)   Collection Time: 11/17/23  3:41 PM   Specimen: Soft Tissue, Other  Result  Value Ref Range Status   Specimen Description FLUID LEFT KNEE  Final   Special Requests SPECIMEN A PT ON ANCEF   Final   Gram Stain   Final    ABUNDANT WBC PRESENT, PREDOMINANTLY PMN NO ORGANISMS SEEN Performed at [facility], [address], [place], [place] [address]    Culture PENDING  Incomplete   Report Status PENDING  Incomplete  Aerobic/Anaerobic Culture w Gram Stain (surgical/deep wound)     Status: None (Preliminary result)   Collection Time: 11/17/23  3:42 PM   Specimen: Soft Tissue, Other  Result Value Ref Range Status   Specimen Description TISSUE KNEE SYNOVIAL  Final   Special Requests PT ON ANCEF  SAMPLE B  Final   Gram Stain   Final    NO WBC SEEN NO ORGANISMS SEEN Performed at [facility], [address] [place], [place] [address]    Culture PENDING  Incomplete   Report Status PENDING  Incomplete  Aerobic/Anaerobic Culture w Gram Stain (surgical/deep wound)     Status: None (Preliminary result)   Collection Time: 11/17/23  3:42 PM   Specimen: Soft Tissue, Other  Result Value Ref Range Status   Specimen Description TISSUE KNEE SYNOVIAL  Final   Special Requests SAMPLE NO 2C, PT ON ANCEF   Final   Gram Stain   Final    RARE WBC PRESENT, PREDOMINANTLY PMN NO ORGANISMS SEEN Performed at [facility], [address], [place], [place] [address]    Culture PENDING  Incomplete   Report Status PENDING  Incomplete     Serology:    Imaging: If present, new imagings (plain films, ct scans, and mri) have been personally visualized and interpreted; radiology reports  have been reviewed. Decision making incorporated into the Impression / Recommendations.  US  EKG SITE RITE Result Date: 11/17/2023 If Site Rite image not attached, placement could not be confirmed due to current cardiac rhythm.  DG Knee Complete 4 Views Right Result Date: 11/06/2023 CLINICAL DATA:  Postop. EXAM: DG KNEE COMPLETE 4+V*R* COMPARISON:  10/27/2023 FINDINGS: Medial compartment hemiarthroplasty in expected alignment. No periprosthetic lucency. No acute fracture. Mild patellofemoral peripheral spurring. Resolved postoperative gas in the soft tissues. Mild residual soft tissue edema. IMPRESSION: Medial compartment hemiarthroplasty in expected alignment. No hardware complication. Electronically Signed   By: Andrea Gasman M.D.   On: 11/06/2023 20:47   DG Knee 1-2 Views Right Result Date: 10/27/2023 EXAM: 1 or 2 VIEW(S) XRAY OF THE RIGHT KNEE 10/27/2023 03:15:00 PM COMPARISON: Right knee radiographs dated 07/08/2023. CLINICAL HISTORY: [id number] Postop check [id number]. Table formatting from the original note was not included.; Postop check Postop check. FINDINGS: BONES AND JOINTS: Status post interval medial compartment right knee Hemiarthroplasty with well positioned prosthesis. Expected gas within and surrounding the right knee joint. No bone fracture or dislocation. No focal osseous lesion. No significant joint effusion. SOFT TISSUES: The soft tissues are unremarkable. IMPRESSION: 1. Status post interval medial compartment right knee hemiarthroplasty with well-positioned prosthesis. Electronically signed by: Selinda Blue MD 10/27/2023 03:39 PM EDT RP Workstation: [id number]     Missy Sandhoff, M.D.  Internal Medicine Resident, PGY-2  8:20 AM, 11/18/2023

## 2023-11-18 NOTE — Progress Notes (Addendum)
 Pharmacy Antibiotic Note  Tammy Eaton is a 73 y.o. female admitted on 11/17/2023 with R Knee prosthetic joint infection.  Pharmacy has been consulted for piperacillin/tazobactam dosing. Discussed switching piperacillin/tazobactam to ampicillin/sulbactam given low risk of pseudomonas, MD agreed.   Per ID, switch ampicillin/sulbactam to ceftriaxone  and start daptomycin instead of vancomcyin.  Plan: Ceftriaxone  2g q24hr Daptomycin 8mg /kg = 500mg  Q24hr  CK Q Thurs  Monitor cultures, clinical status, renal function  Narrow abx as able and f/u duration per ID   Height: 5' 5.5 (166.4 cm) Weight: 62.6 kg (138 lb) IBW/kg (Calculated) : 58.15  Temp (24hrs), Avg:98.8 F (37.1 C), Min:98.2 F (36.8 C), Max:100.9 F (38.3 C)  Recent Labs  Lab 11/18/23 0834  CREATININE 0.84    Estimated Creatinine Clearance: 54.8 mL/min (by C-G formula based on SCr of 0.84 mg/dL).    Allergies  Allergen Reactions   Adhesive [Tape] Other (See Comments)    Burned skin  TAPE WITH BETADINE  was used   Septra [Sulfamethoxazole-Trimethoprim] Hives    Antimicrobials this admission: Doxy 10/14 Cefazolin  10/14 vancomycin  10/14 Dapto 10/15 >>  CTX 10/15 >>   Dose adjustments this admission: N/a  Microbiology results: 10/14 OR knee tissue x3: ngtd   Thank you for allowing pharmacy to be a part of this patient's care.  Jinnie Door, PharmD, BCPS, BCCP Clinical Pharmacist  Please check AMION for all [facility] phone numbers After 10:00 PM, call Main Pharmacy [phone]

## 2023-11-19 ENCOUNTER — Inpatient Hospital Stay (HOSPITAL_COMMUNITY): Admitting: Certified Registered Nurse Anesthetist

## 2023-11-19 LAB — GLUCOSE, CAPILLARY
Glucose-Capillary: 28 mg/dL — CL (ref 70–99)
Glucose-Capillary: 39 mg/dL — CL (ref 70–99)

## 2023-11-19 MED ORDER — DEXTROSE 50 % IV SOLN
INTRAVENOUS | Status: AC
  Filled 2023-11-19: qty 50

## 2023-11-19 MED ORDER — SUCCINYLCHOLINE CHLORIDE 200 MG/10ML IV SOSY
PREFILLED_SYRINGE | INTRAVENOUS | Status: DC | PRN
  Administered 2023-11-19: 80 mg via INTRAVENOUS

## 2023-11-22 LAB — AEROBIC/ANAEROBIC CULTURE W GRAM STAIN (SURGICAL/DEEP WOUND)
Culture: NO GROWTH
Culture: NO GROWTH
Culture: NO GROWTH
Gram Stain: NONE SEEN

## 2023-11-30 ENCOUNTER — Ambulatory Visit

## 2023-12-01 ENCOUNTER — Other Ambulatory Visit

## 2023-12-02 ENCOUNTER — Encounter (HOSPITAL_BASED_OUTPATIENT_CLINIC_OR_DEPARTMENT_OTHER): Admitting: Orthopaedic Surgery

## 2023-12-02 MED FILL — Medication: Qty: 1 | Status: AC

## 2023-12-05 NOTE — Significant Event (Signed)
 I arrived at the bedside at approximately 6:30 AM.  Per report of the doctor that was leading the code, the patient was found at approximately 6:10 during a routine nursing check found to be in asystole and apneic.  There was no evidence of instability prior to this.  A code did pursue at which time an airway was very difficult to establish due to clenching of the jaw.  Airway was eventually established approximately 8-10 minutes into the code and CPR did proceed with multiple cycles for total of approximately 30 minutes.  Unfortunately the patient was still found to be in asystole.  At that time the patient was declared deceased.  I did sit with and counsel the family at the bedside for quite some time.

## 2023-12-05 NOTE — Progress Notes (Signed)
 Nurse in patient room to assess pain, patient requesting that vital signs not be taken again until morning since she had not been able to sleep the night prior due to extreme pain and staff constantly in the room interrupting her sleep.  Patient informed that the tech would be notified so that she could get rest and care would be clustered whenever possible throughout the night.  Nurse tech notified that patient was requesting vital signs to be held until morning to optimize rest.

## 2023-12-05 NOTE — Anesthesia Procedure Notes (Signed)
 Procedure Name: Intubation Date/Time: 2023/12/13 7:40 AM  Performed by: Lettie Derrek Dacosta, CRNAPre-anesthesia Checklist: Patient identified, Patient being monitored, Timeout performed, Emergency Drugs available and Suction available Patient Re-evaluated:Patient Re-evaluated prior to induction Oxygen Delivery Method: Circle System Utilized Preoxygenation: Pre-oxygenation with 100% oxygen Induction Type: IV induction Ventilation: Oral airway inserted - appropriate to patient size Laryngoscope Size: 3 and Glidescope Grade View: Grade II Tube type: Oral Tube size: 7.5 mm Number of attempts: 1 Airway Equipment and Method: Rigid stylet and Video-laryngoscopy Placement Confirmation: ETT inserted through vocal cords under direct vision, positive ETCO2, breath sounds checked- equal and bilateral and CO2 detector Secured at: 22 cm Tube secured with: Tape Dental Injury: Teeth and Oropharynx as per pre-operative assessment  Comments: Intubated by JINNY Peoples MD

## 2023-12-05 NOTE — Plan of Care (Signed)

## 2023-12-05 NOTE — Progress Notes (Signed)
   07-Dec-2023 9078  Spiritual Encounters  Type of Visit Initial  Care provided to: Pt and family  Referral source Clinical staff  Reason for visit End-of-life  OnCall Visit No  Spiritual Framework  Presenting Themes Impactful experiences and emotions  Community/Connection Family  Patient Stress Factors Health changes;Major life changes  Family Stress Factors Exhausted;Health changes;Major life changes  Interventions  Spiritual Care Interventions Made Compassionate presence;Normalization of emotions;Established relationship of care and support;Prayer  Intervention Outcomes  Outcomes Awareness of health;Awareness of support   Chaplain responded to a page to visit the family of a Pt who had recently passed away. Upon arrival, the chaplain introduced himself and provided a ministry of presence as the family reflect on memories of their love one.  The attending physician was present in the room. At an appropriate time,  the chaplain offered to pray, and the family accepted. A comforting prayer was shared shared.   The chaplain also provided the Pt's family member with a contact card for the Patient Placement Department to assist with funeral arrangements and next steps.

## 2023-12-05 NOTE — Progress Notes (Signed)
   Dec 08, 2023 0644  Spiritual Encounters  Type of Visit Attempt (pt unavailable)  Care provided to: Patient  Conversation partners present during encounter Physician;Nurse  Referral source Code page  Reason for visit Code  OnCall Visit Yes   Responded to code blue (request had wrong room number as 5N20).

## 2023-12-05 NOTE — Code Documentation (Signed)
  Patient Name: Tammy Eaton   MRN: [id number]   Date of Birth/ Sex: [birth year] , female      Admission Date: 11/17/2023  Attending Provider: Genelle Standing, MD  Primary Diagnosis: Infection of skin and subcutaneous tissue [L08.9] Prosthetic joint infection [T84.50XA]   Indication: Pt was in her usual state of health until this 6:25 AM, when she was noted to be in cardiac arrest. Code blue was subsequently called. At the time of arrival on scene, ACLS protocol was underway.   Technical Description:  - CPR performance duration:  30 minute  - Was defibrillation or cardioversion used? No   - Was external pacer placed? No  - Was patient intubated pre/post CPR? Yes   Medications Administered: Y = Yes; Blank = No Amiodarone    Atropine    Calcium  Y  Epinephrine   Y  Lidocaine     Magnesium    Norepinephrine    Phenylephrine     Sodium bicarbonate  Y  Vasopressin    Other DW   Post CPR evaluation:  - Final Status - Was patient successfully resuscitated ? No   Miscellaneous Information:  - Time of death:  6: 55 AM  - Primary team notified?  Yes  - Family Notified? Yes     Bernadine Manos, MD   12/09/23, 7:06 AM

## 2023-12-05 NOTE — Discharge Summary (Addendum)
 Patient ID: Tammy Eaton MRN: [id number] DOB/AGE: [birth year] 73 y.o.  Admit date: 11/17/2023 Discharge date: 27-Nov-2023  Admission Diagnoses:  Prosthetic joint infection  Discharge Diagnoses:  Principal Problem:   Prosthetic joint infection Active Problems:   Bacterial infection of knee joint (HCC)   Past Medical History:  Diagnosis Date   Anxiety    Arthritis 2018   Bulging lumbar disc 2018   Complication of anesthesia    low blood pressure after retinal detachment surgery   Constipation    COPD (chronic obstructive pulmonary disease) (HCC)    Coronary artery disease    follows up with dr okey prn   Degenerative disc disease, lumbar 2018   Depression    Emphysema of lung (HCC)    Family history of ovarian cancer    mother died of ovarian cancer   GERD (gastroesophageal reflux disease)    History of hiatal hernia    small   History of kidney stones    History of pneumonia    multiple times   History of stomach ulcers yrs ago   Hyperlipidemia    lung ca 05/2003   Multiple lung nodules    followed by dr kerrin ronco 04-10-2020   Osteoarthritis    spine, knees, hands   PONV (postoperative nausea and vomiting)    pt could not eat during hospital stay   Reflux esophagitis    Scoliosis 2018   Shortness of breath    with lifting heavy things   Wears glasses     Surgeries: Procedure(s): IRRIGATION AND DEBRIDEMENT WITH POLYETHYLENE EXCHANGE on 11/17/2023   Consultants (if any):   Discharged Condition: Improved  Hospital Course: Tammy Eaton is an 73 y.o. female who was admitted 11/17/2023 with a diagnosis of Prosthetic joint infection and went to the operating room on 11/17/2023 and underwent the above named procedures.    She was given perioperative antibiotics:  Anti-infectives (From admission, onward)    Start     Dose/Rate Route Frequency Ordered Stop   11/18/23 1400  cefTRIAXone  (ROCEPHIN ) 2 g in sodium chloride  0.9 % 100 mL  IVPB        2 g 200 mL/hr over 30 Minutes Intravenous Every 24 hours 11/18/23 0938     11/18/23 1015  DAPTOmycin (CUBICIN) IVPB 500 mg/25mL premix        8 mg/kg  62.6 kg 100 mL/hr over 30 Minutes Intravenous Daily 11/18/23 0927     11/18/23 0900  Ampicillin-Sulbactam (UNASYN) 3 g in sodium chloride  0.9 % 100 mL IVPB  Status:  Discontinued        3 g 200 mL/hr over 30 Minutes Intravenous Every 6 hours 11/18/23 0806 11/18/23 0938   11/18/23 0900  vancomycin  (VANCOREADY) IVPB 1250 mg/250 mL  Status:  Discontinued        1,250 mg 166.7 mL/hr over 90 Minutes Intravenous Every 24 hours 11/18/23 0808 11/18/23 0831   11/18/23 0815  vancomycin  (VANCOCIN ) IVPB 1000 mg/200 mL premix  Status:  Discontinued        1,000 mg 200 mL/hr over 60 Minutes Intravenous Every 12 hours 11/18/23 0725 11/18/23 0806   11/18/23 0815  piperacillin-tazobactam (ZOSYN) IVPB 4.5 g  Status:  Discontinued        4.5 g 200 mL/hr over 30 Minutes Intravenous Every 8 hours 11/18/23 0725 11/18/23 0806   11/17/23 2000  doxycycline (VIBRA-TABS) tablet 100 mg  Status:  Discontinued        100 mg Oral Every  12 hours 11/17/23 1821 11/18/23 0848   11/17/23 1508  vancomycin  (VANCOCIN ) powder  Status:  Discontinued          As needed 11/17/23 1508 11/17/23 1612   11/17/23 1315  ceFAZolin  (ANCEF ) IVPB 2g/100 mL premix        2 g 200 mL/hr over 30 Minutes Intravenous On call to O.R. 11/17/23 1301 11/17/23 1510     .   I arrived at the bedside 2023-12-09 at approximately 6:30 AM.  Per report of the doctor that was leading the code, the patient was found at approximately 6:10 during a routine nursing check found to be in asystole and apneic.  There was no evidence of instability prior to this.  A code did pursue at which time an airway was very difficult to establish due to clenching of the jaw.  Airway was eventually established approximately 8-10 minutes into the code and CPR did proceed with multiple cycles for total of approximately 30  minutes.  Unfortunately the patient was still found to be in asystole.  At that time the patient was declared deceased.  I did sit with and counsel the family at the bedside for quite some time.   Recent vital signs:  Vitals:   11/18/23 1743 11/18/23 2030  BP:  124/69  Pulse:  97  Resp:  16  Temp: 99 F (37.2 C) 98.5 F (36.9 C)  SpO2:  92%    Recent laboratory studies:  Lab Results  Component Value Date   HGB 14.8 10/23/2023   HGB 14.9 04/19/2020   HGB 15.3 (H) 11/29/2019   Lab Results  Component Value Date   WBC 7.2 10/23/2023   PLT 244 10/23/2023   Lab Results  Component Value Date   INR 1.07 01/11/2015   Lab Results  Component Value Date   NA 140 11/18/2023   K 4.4 11/18/2023   CL 106 11/18/2023   CO2 25 11/18/2023   BUN 9 11/18/2023   CREATININE 0.84 11/18/2023   GLUCOSE 86 11/18/2023    Discharge Medications:   Allergies as of 09-Dec-2023       Reactions   Adhesive [tape] Other (See Comments)   Burned skin TAPE WITH BETADINE  was used   Septra [sulfamethoxazole-trimethoprim] Hives        Medication List     TAKE these medications    acetaminophen  500 MG tablet Commonly known as: TYLENOL  Take 500 mg by mouth every 8 (eight) hours.   amitriptyline  100 MG tablet Commonly known as: ELAVIL  Take 100 mg by mouth at bedtime.   aspirin  EC 325 MG tablet Take 1 tablet (325 mg total) by mouth daily.   clonazePAM 1 MG tablet Commonly known as: KLONOPIN Take 1 mg by mouth at bedtime.   doxycycline 100 MG tablet Commonly known as: VIBRA-TABS Take 1 tablet (100 mg total) by mouth 2 (two) times daily for 7 days.   fluticasone  50 MCG/ACT nasal spray Commonly known as: FLONASE Place 2 sprays into both nostrils daily.   fluticasone -salmeterol 250-50 MCG/ACT Aepb Commonly known as: Wixela Inhub Inhale 1 puff into the lungs in the morning and at bedtime.   ibuprofen  800 MG tablet Commonly known as: ADVIL  Take 800 mg by mouth every 8 (eight) hours  as needed (pain.).   METAMUCIL 3 IN 1 DAILY FIBER PO Take 2 capsules by mouth daily.   multivitamin with minerals Tabs tablet Take 1 tablet by mouth daily.   oxyCODONE  5 MG immediate release tablet Commonly known  as: Roxicodone  Take 1 tablet (5 mg total) by mouth every 4 (four) hours as needed for severe pain (pain score 7-10) or breakthrough pain.   rosuvastatin 5 MG tablet Commonly known as: CRESTOR Take 5 mg by mouth 4 (four) times a week. Mondays through Thursdays.   tiZANidine 4 MG tablet Commonly known as: ZANAFLEX Take 4 mg by mouth 3 (three) times daily as needed for muscle spasms.   traMADol  50 MG tablet Commonly known as: ULTRAM  Take 1 tablet (50 mg total) by mouth every 6 (six) hours as needed.   VITAMIN D3 PO Take 2,000 Units by mouth daily.        Diagnostic Studies: US  EKG SITE RITE Result Date: 11/17/2023 If Site Rite image not attached, placement could not be confirmed due to current cardiac rhythm.  DG Knee Complete 4 Views Right Result Date: 11/06/2023 CLINICAL DATA:  Postop. EXAM: DG KNEE COMPLETE 4+V*R* COMPARISON:  10/27/2023 FINDINGS: Medial compartment hemiarthroplasty in expected alignment. No periprosthetic lucency. No acute fracture. Mild patellofemoral peripheral spurring. Resolved postoperative gas in the soft tissues. Mild residual soft tissue edema. IMPRESSION: Medial compartment hemiarthroplasty in expected alignment. No hardware complication. Electronically Signed   By: Andrea Gasman M.D.   On: 11/06/2023 20:47   DG Knee 1-2 Views Right Result Date: 10/27/2023 EXAM: 1 or 2 VIEW(S) XRAY OF THE RIGHT KNEE 10/27/2023 03:15:00 PM COMPARISON: Right knee radiographs dated 07/08/2023. CLINICAL HISTORY: [id number] Postop check [id number]. Table formatting from the original note was not included.; Postop check Postop check. FINDINGS: BONES AND JOINTS: Status post interval medial compartment right knee Hemiarthroplasty with well positioned prosthesis. Expected  gas within and surrounding the right knee joint. No bone fracture or dislocation. No focal osseous lesion. No significant joint effusion. SOFT TISSUES: The soft tissues are unremarkable. IMPRESSION: 1. Status post interval medial compartment right knee hemiarthroplasty with well-positioned prosthesis. Electronically signed by: Selinda Blue MD 10/27/2023 03:39 PM EDT RP Workstation: [id number]    Disposition:  There are no questions and answers to display.             SignedBETHA ELSPETH PARKER 11-22-23, 1:18 PM

## 2023-12-05 DEATH — deceased

## 2023-12-30 ENCOUNTER — Encounter

## 2023-12-30 ENCOUNTER — Ambulatory Visit (HOSPITAL_BASED_OUTPATIENT_CLINIC_OR_DEPARTMENT_OTHER): Admitting: Pulmonary Disease

## 2024-01-22 ENCOUNTER — Other Ambulatory Visit

## 2024-02-23 ENCOUNTER — Encounter (HOSPITAL_BASED_OUTPATIENT_CLINIC_OR_DEPARTMENT_OTHER): Payer: Self-pay

## 2024-02-23 ENCOUNTER — Other Ambulatory Visit (HOSPITAL_BASED_OUTPATIENT_CLINIC_OR_DEPARTMENT_OTHER)
